# Patient Record
Sex: Female | Born: 1935
Health system: Southern US, Community
[De-identification: ages and names within clinical notes are randomized; demographics above are authoritative.]

## PROBLEM LIST (undated history)

## (undated) DIAGNOSIS — C4491 Basal cell carcinoma of skin, unspecified: Secondary | ICD-10-CM

## (undated) DIAGNOSIS — M545 Low back pain, unspecified: Secondary | ICD-10-CM

## (undated) DIAGNOSIS — I729 Aneurysm of unspecified site: Secondary | ICD-10-CM

## (undated) DIAGNOSIS — M353 Polymyalgia rheumatica: Secondary | ICD-10-CM

## (undated) DIAGNOSIS — D649 Anemia, unspecified: Secondary | ICD-10-CM

## (undated) DIAGNOSIS — K449 Diaphragmatic hernia without obstruction or gangrene: Secondary | ICD-10-CM

## (undated) DIAGNOSIS — K635 Polyp of colon: Secondary | ICD-10-CM

## (undated) DIAGNOSIS — Z950 Presence of cardiac pacemaker: Secondary | ICD-10-CM

## (undated) DIAGNOSIS — I501 Left ventricular failure: Secondary | ICD-10-CM

## (undated) DIAGNOSIS — E7800 Pure hypercholesterolemia, unspecified: Secondary | ICD-10-CM

## (undated) DIAGNOSIS — I10 Essential (primary) hypertension: Secondary | ICD-10-CM

## (undated) DIAGNOSIS — J309 Allergic rhinitis, unspecified: Secondary | ICD-10-CM

## (undated) DIAGNOSIS — E039 Hypothyroidism, unspecified: Secondary | ICD-10-CM

## (undated) DIAGNOSIS — G4700 Insomnia, unspecified: Secondary | ICD-10-CM

## (undated) DIAGNOSIS — G8929 Other chronic pain: Secondary | ICD-10-CM

## (undated) DIAGNOSIS — F419 Anxiety disorder, unspecified: Secondary | ICD-10-CM

## (undated) DIAGNOSIS — E785 Hyperlipidemia, unspecified: Secondary | ICD-10-CM

## (undated) DIAGNOSIS — K219 Gastro-esophageal reflux disease without esophagitis: Secondary | ICD-10-CM

## (undated) DIAGNOSIS — M810 Age-related osteoporosis without current pathological fracture: Secondary | ICD-10-CM

## (undated) HISTORY — DX: Left ventricular failure, unspecified: I50.1

## (undated) HISTORY — PX: ABDOMINAL HYSTERECTOMY: SHX81

## (undated) HISTORY — DX: Anemia, unspecified: D64.9

## (undated) HISTORY — PX: BACK SURGERY: SHX140

## (undated) HISTORY — DX: Diaphragmatic hernia without obstruction or gangrene: K44.9

## (undated) HISTORY — PX: TONSILLECTOMY: SUR1361

## (undated) HISTORY — DX: Gastro-esophageal reflux disease without esophagitis: K21.9

## (undated) HISTORY — PX: THYROIDECTOMY: SHX17

## (undated) HISTORY — DX: Basal cell carcinoma of skin, unspecified: C44.91

## (undated) HISTORY — DX: Allergic rhinitis, unspecified: J30.9

## (undated) HISTORY — PX: CATARACT EXTRACTION: SUR2

## (undated) HISTORY — DX: Presence of cardiac pacemaker: Z95.0

## (undated) HISTORY — DX: Hyperlipidemia, unspecified: E78.5

## (undated) HISTORY — DX: Polyp of colon: K63.5

## (undated) HISTORY — DX: Anxiety disorder, unspecified: F41.9

## (undated) HISTORY — DX: Insomnia, unspecified: G47.00

## (undated) HISTORY — DX: Other chronic pain: G89.29

## (undated) HISTORY — DX: Essential (primary) hypertension: I10

## (undated) HISTORY — PX: FOOT SURGERY: SHX648

## (undated) HISTORY — PX: MITRAL VALVE REPAIR: SHX2039

## (undated) HISTORY — DX: Low back pain: M54.5

## (undated) HISTORY — PX: OTHER SURGICAL HISTORY: SHX169

## (undated) HISTORY — DX: Hypothyroidism, unspecified: E03.9

## (undated) HISTORY — DX: Age-related osteoporosis without current pathological fracture: M81.0

## (undated) HISTORY — DX: Low back pain, unspecified: M54.50

## (undated) HISTORY — DX: Polymyalgia rheumatica: M35.3

## (undated) HISTORY — DX: Pure hypercholesterolemia, unspecified: E78.00

## (undated) HISTORY — PX: ANGIOPLASTY: SHX39

## (undated) HISTORY — DX: Aneurysm of unspecified site: I72.9

---

## 2000-03-25 ENCOUNTER — Encounter: Admission: RE | Admit: 2000-03-25 | Discharge: 2000-03-25 | Payer: Self-pay | Admitting: Family Medicine

## 2000-03-25 ENCOUNTER — Encounter: Payer: Self-pay | Admitting: Family Medicine

## 2002-04-16 ENCOUNTER — Encounter (INDEPENDENT_AMBULATORY_CARE_PROVIDER_SITE_OTHER): Payer: Self-pay | Admitting: Specialist

## 2002-04-16 ENCOUNTER — Ambulatory Visit (HOSPITAL_COMMUNITY): Admission: RE | Admit: 2002-04-16 | Discharge: 2002-04-16 | Payer: Self-pay | Admitting: Gastroenterology

## 2004-04-06 ENCOUNTER — Encounter: Admission: RE | Admit: 2004-04-06 | Discharge: 2004-04-06 | Payer: Self-pay | Admitting: Family Medicine

## 2006-01-26 ENCOUNTER — Inpatient Hospital Stay (HOSPITAL_COMMUNITY): Admission: EM | Admit: 2006-01-26 | Discharge: 2006-02-01 | Payer: Self-pay | Admitting: *Deleted

## 2006-01-28 ENCOUNTER — Encounter (INDEPENDENT_AMBULATORY_CARE_PROVIDER_SITE_OTHER): Payer: Self-pay | Admitting: *Deleted

## 2006-04-13 ENCOUNTER — Emergency Department (HOSPITAL_COMMUNITY): Admission: EM | Admit: 2006-04-13 | Discharge: 2006-04-14 | Payer: Self-pay | Admitting: *Deleted

## 2006-07-12 ENCOUNTER — Inpatient Hospital Stay (HOSPITAL_COMMUNITY): Admission: AD | Admit: 2006-07-12 | Discharge: 2006-07-15 | Payer: Self-pay | Admitting: Interventional Cardiology

## 2006-07-25 ENCOUNTER — Ambulatory Visit (HOSPITAL_COMMUNITY): Admission: RE | Admit: 2006-07-25 | Discharge: 2006-07-25 | Payer: Self-pay | Admitting: Interventional Cardiology

## 2006-07-25 ENCOUNTER — Encounter (INDEPENDENT_AMBULATORY_CARE_PROVIDER_SITE_OTHER): Payer: Self-pay | Admitting: Interventional Cardiology

## 2006-08-14 ENCOUNTER — Inpatient Hospital Stay (HOSPITAL_COMMUNITY): Admission: AD | Admit: 2006-08-14 | Discharge: 2006-08-24 | Payer: Self-pay | Admitting: Interventional Cardiology

## 2006-08-14 ENCOUNTER — Encounter: Payer: Self-pay | Admitting: Vascular Surgery

## 2006-08-14 ENCOUNTER — Inpatient Hospital Stay (HOSPITAL_BASED_OUTPATIENT_CLINIC_OR_DEPARTMENT_OTHER): Admission: RE | Admit: 2006-08-14 | Discharge: 2006-08-14 | Payer: Self-pay | Admitting: Interventional Cardiology

## 2006-09-19 ENCOUNTER — Encounter (HOSPITAL_COMMUNITY): Admission: RE | Admit: 2006-09-19 | Discharge: 2006-12-18 | Payer: Self-pay | Admitting: Interventional Cardiology

## 2006-10-14 ENCOUNTER — Encounter (INDEPENDENT_AMBULATORY_CARE_PROVIDER_SITE_OTHER): Payer: Self-pay | Admitting: Interventional Cardiology

## 2006-10-14 ENCOUNTER — Ambulatory Visit (HOSPITAL_COMMUNITY): Admission: RE | Admit: 2006-10-14 | Discharge: 2006-10-14 | Payer: Self-pay | Admitting: Interventional Cardiology

## 2006-10-16 ENCOUNTER — Inpatient Hospital Stay (HOSPITAL_BASED_OUTPATIENT_CLINIC_OR_DEPARTMENT_OTHER): Admission: RE | Admit: 2006-10-16 | Discharge: 2006-10-16 | Payer: Self-pay | Admitting: Interventional Cardiology

## 2006-10-16 HISTORY — PX: CARDIAC CATHETERIZATION: SHX172

## 2006-12-19 ENCOUNTER — Encounter (HOSPITAL_COMMUNITY): Admission: RE | Admit: 2006-12-19 | Discharge: 2007-02-07 | Payer: Self-pay | Admitting: Interventional Cardiology

## 2007-01-28 ENCOUNTER — Encounter: Admission: RE | Admit: 2007-01-28 | Discharge: 2007-01-28 | Payer: Self-pay | Admitting: Sports Medicine

## 2007-01-29 ENCOUNTER — Encounter: Admission: RE | Admit: 2007-01-29 | Discharge: 2007-01-29 | Payer: Self-pay | Admitting: Sports Medicine

## 2007-02-11 ENCOUNTER — Encounter: Admission: RE | Admit: 2007-02-11 | Discharge: 2007-02-11 | Payer: Self-pay | Admitting: Radiology

## 2007-02-17 ENCOUNTER — Ambulatory Visit (HOSPITAL_COMMUNITY): Admission: RE | Admit: 2007-02-17 | Discharge: 2007-02-17 | Payer: Self-pay | Admitting: Radiology

## 2007-03-04 ENCOUNTER — Encounter: Admission: RE | Admit: 2007-03-04 | Discharge: 2007-03-04 | Payer: Self-pay | Admitting: Radiology

## 2007-03-05 ENCOUNTER — Encounter: Admission: RE | Admit: 2007-03-05 | Discharge: 2007-03-05 | Payer: Self-pay | Admitting: Radiology

## 2007-03-11 ENCOUNTER — Encounter: Admission: RE | Admit: 2007-03-11 | Discharge: 2007-03-11 | Payer: Self-pay | Admitting: Radiology

## 2007-05-05 ENCOUNTER — Encounter: Admission: RE | Admit: 2007-05-05 | Discharge: 2007-05-05 | Payer: Self-pay | Admitting: Cardiology

## 2007-05-08 ENCOUNTER — Inpatient Hospital Stay (HOSPITAL_COMMUNITY): Admission: RE | Admit: 2007-05-08 | Discharge: 2007-05-09 | Payer: Self-pay | Admitting: Cardiology

## 2007-10-07 ENCOUNTER — Encounter: Admission: RE | Admit: 2007-10-07 | Discharge: 2007-11-26 | Payer: Self-pay | Admitting: Family Medicine

## 2007-12-01 ENCOUNTER — Encounter: Admission: RE | Admit: 2007-12-01 | Discharge: 2007-12-31 | Payer: Self-pay | Admitting: Family Medicine

## 2007-12-04 ENCOUNTER — Encounter: Admission: RE | Admit: 2007-12-04 | Discharge: 2007-12-04 | Payer: Self-pay | Admitting: Family Medicine

## 2008-09-21 ENCOUNTER — Emergency Department (HOSPITAL_COMMUNITY): Admission: EM | Admit: 2008-09-21 | Discharge: 2008-09-21 | Payer: Self-pay | Admitting: Emergency Medicine

## 2008-11-30 IMAGING — CR DG LUMBAR SPINE COMPLETE 4+V
5 series · 5 of 5 positions shown · non-contrast
Comparison: MRI 01/01/07.

CLINICAL DATA: 75-year-old female, L-1 fracture.  Increased pain since original fracture. 
LUMBAR SPINE ? 4 VIEW:

[w l-spine a.p.]
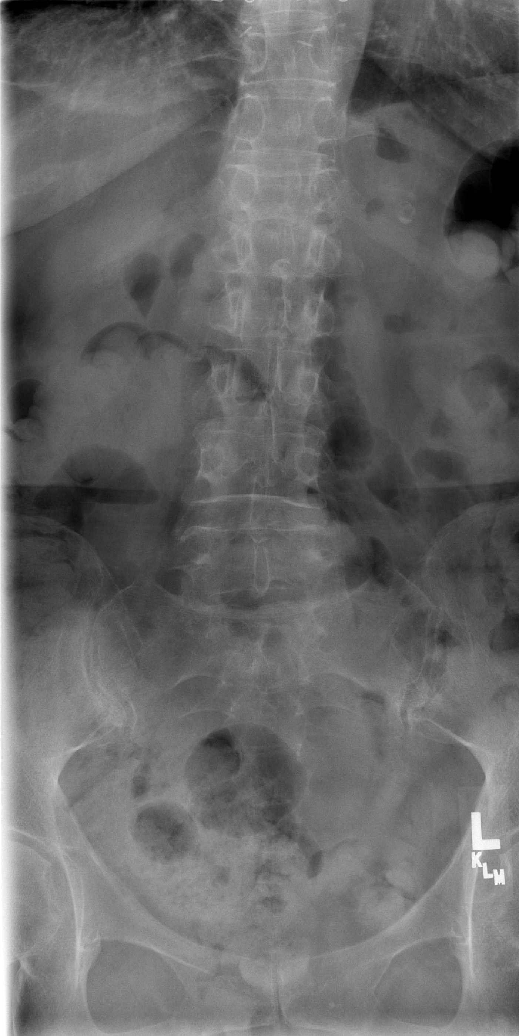

[w l-spine lat]
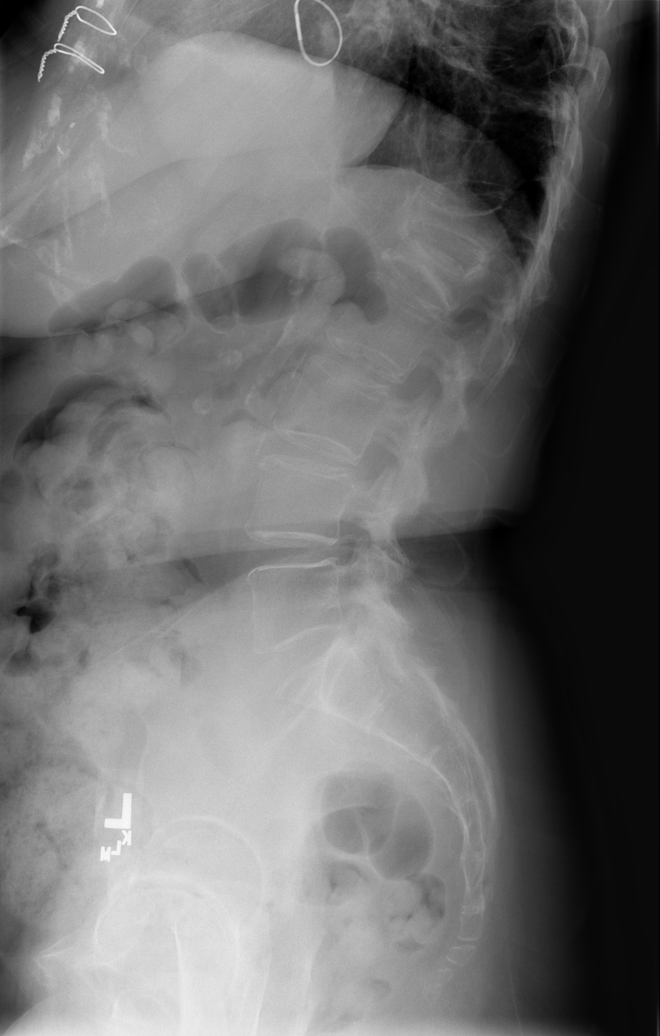

[t l-spine oblique exposure (1 of 2)]
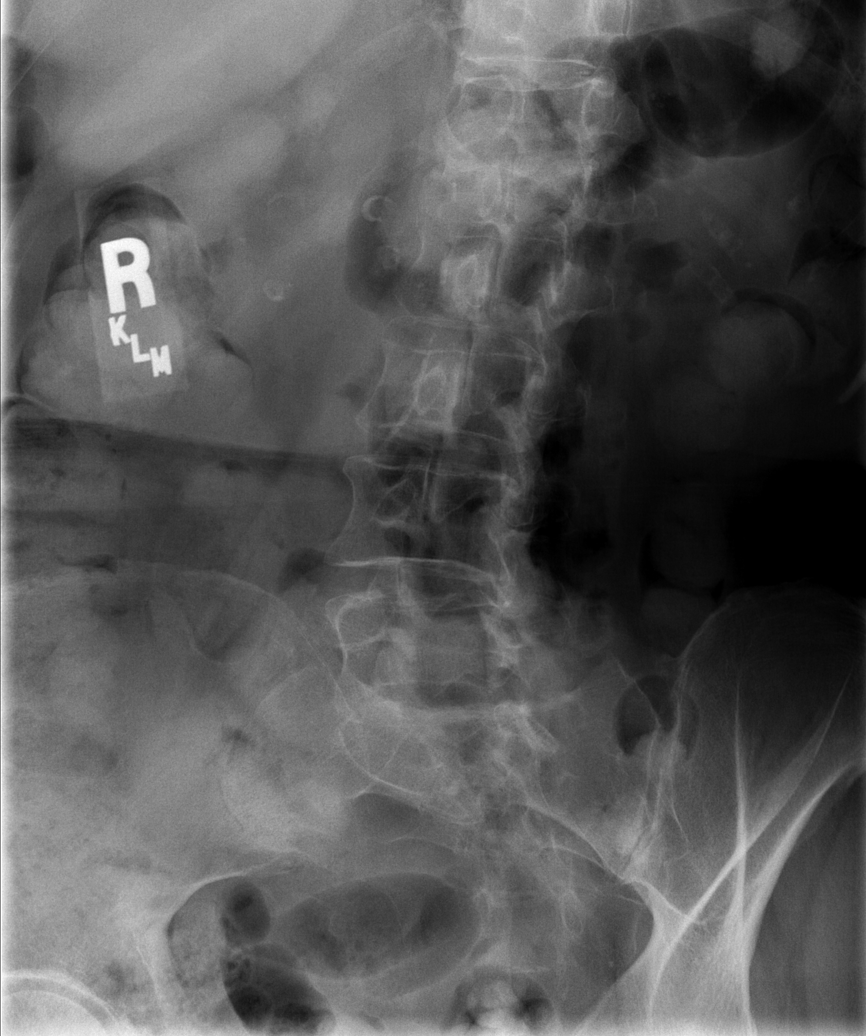

[t l-spine oblique exposure (2 of 2)]
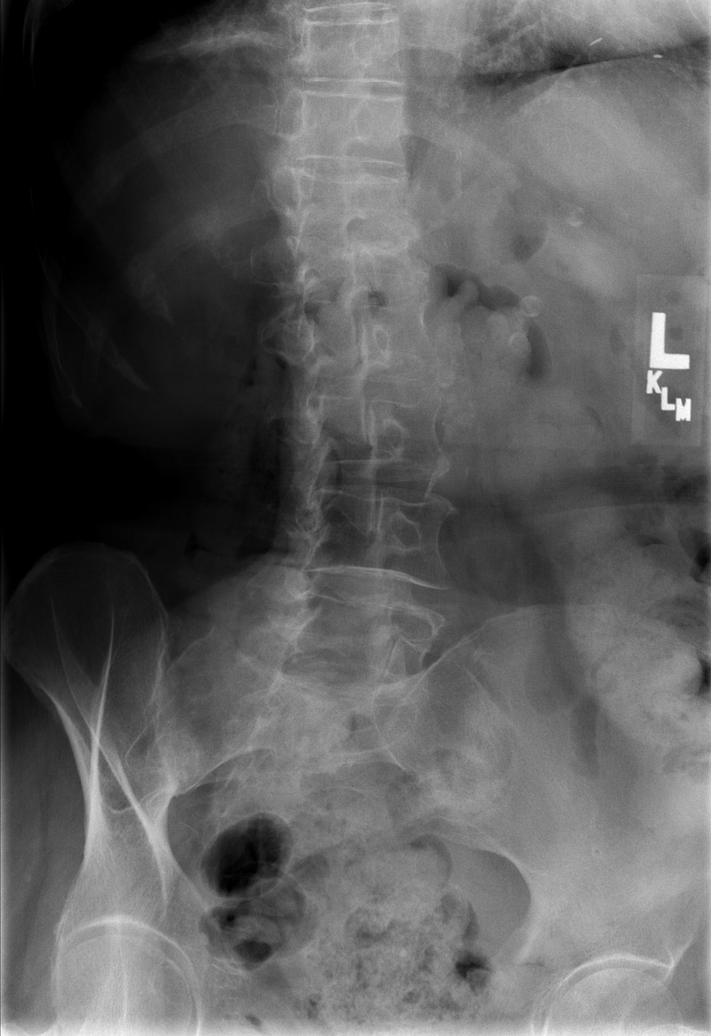

[t l-spine l5-s1 spot]
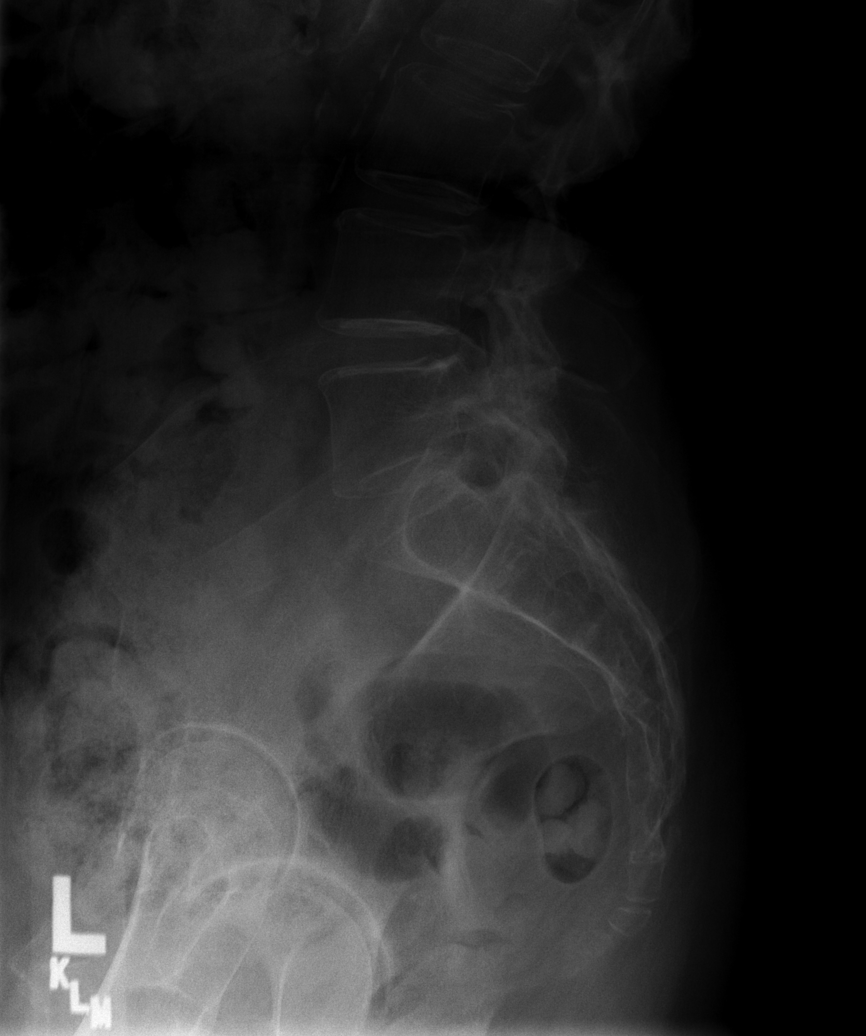

[5 of 5 positions shown; findings below may reference images not displayed]

FINDINGS: The compression fracture at L-1 is re-demonstrated.  There is increased sclerosis within this, compatible with an acute or subacute fracture.  A new fracture is evident at T-12.  This was not seen on the MRI.  There is no definite retropulsion of bone.  Facet degenerative changes are noted in the lower lumbar spine.  The L-2 through L-5 vertebral bodies are maintained.  Alignment is anatomic.  Atherosclerotic calcifications are noted within the aorta.
IMPRESSION: 1.  Compression fractures of T-12 and L-1.
2.  The L-1 fracture is of a similar configuration to the MRI.
3.  The T-12 fracture is new.

## 2009-03-07 ENCOUNTER — Encounter: Admission: RE | Admit: 2009-03-07 | Discharge: 2009-03-07 | Payer: Self-pay | Admitting: Gastroenterology

## 2010-10-18 ENCOUNTER — Emergency Department (HOSPITAL_COMMUNITY): Admission: EM | Admit: 2010-10-18 | Discharge: 2010-10-18 | Payer: Self-pay | Admitting: Emergency Medicine

## 2010-12-16 ENCOUNTER — Encounter: Payer: Self-pay | Admitting: Thoracic Surgery (Cardiothoracic Vascular Surgery)

## 2010-12-17 ENCOUNTER — Encounter: Payer: Self-pay | Admitting: Radiology

## 2011-02-06 LAB — BASIC METABOLIC PANEL WITH GFR
BUN: 24 mg/dL — ABNORMAL HIGH (ref 6–23)
CO2: 22 meq/L (ref 19–32)
Calcium: 9 mg/dL (ref 8.4–10.5)
Chloride: 104 meq/L (ref 96–112)
Creatinine, Ser: 1.5 mg/dL — ABNORMAL HIGH (ref 0.4–1.2)
GFR calc non Af Amer: 34 mL/min — ABNORMAL LOW
Glucose, Bld: 101 mg/dL — ABNORMAL HIGH (ref 70–99)
Potassium: 4.9 meq/L (ref 3.5–5.1)
Sodium: 136 meq/L (ref 135–145)

## 2011-02-06 LAB — TROPONIN I: Troponin I: 0.02 ng/mL (ref 0.00–0.06)

## 2011-02-06 LAB — IRON AND TIBC
Saturation Ratios: 12 % — ABNORMAL LOW (ref 20–55)
TIBC: 238 ug/dL — ABNORMAL LOW (ref 250–470)

## 2011-02-06 LAB — CK TOTAL AND CKMB (NOT AT ARMC)
CK, MB: 6.6 ng/mL (ref 0.3–4.0)
Total CK: 328 U/L — ABNORMAL HIGH (ref 7–177)

## 2011-02-06 LAB — URINE MICROSCOPIC-ADD ON

## 2011-02-06 LAB — CBC
MCH: 30.1 pg (ref 26.0–34.0)
MCHC: 31.4 g/dL (ref 30.0–36.0)
MCV: 95.9 fL (ref 78.0–100.0)
Platelets: 316 10*3/uL (ref 150–400)
RBC: 3.45 MIL/uL — ABNORMAL LOW (ref 3.87–5.11)

## 2011-02-06 LAB — URINALYSIS, ROUTINE W REFLEX MICROSCOPIC
Glucose, UA: NEGATIVE mg/dL
Hgb urine dipstick: NEGATIVE
Ketones, ur: NEGATIVE mg/dL
Protein, ur: NEGATIVE mg/dL
pH: 5 (ref 5.0–8.0)

## 2011-02-06 LAB — VITAMIN B12: Vitamin B-12: >2000 pg/mL — ABNORMAL HIGH (ref 211–911)

## 2011-02-06 LAB — DIFFERENTIAL
Basophils Relative: 1 % (ref 0–1)
Eosinophils Absolute: 0.4 10*3/uL (ref 0.0–0.7)
Eosinophils Relative: 4 % (ref 0–5)
Lymphs Abs: 3 10*3/uL (ref 0.7–4.0)
Monocytes Relative: 14 % — ABNORMAL HIGH (ref 3–12)

## 2011-02-06 LAB — HEPATIC FUNCTION PANEL
Albumin: 3.2 g/dL — ABNORMAL LOW (ref 3.5–5.2)
Indirect Bilirubin: 0.2 mg/dL — ABNORMAL LOW (ref 0.3–0.9)
Total Protein: 6.5 g/dL (ref 6.0–8.3)

## 2011-02-06 LAB — POCT CARDIAC MARKERS

## 2011-02-06 LAB — BRAIN NATRIURETIC PEPTIDE: Pro B Natriuretic peptide (BNP): 90 pg/mL (ref 0.0–100.0)

## 2011-02-09 ENCOUNTER — Encounter (INDEPENDENT_AMBULATORY_CARE_PROVIDER_SITE_OTHER): Payer: Self-pay | Admitting: *Deleted

## 2011-02-13 NOTE — Letter (Signed)
Summary: Appointment - Reminder Rogers, [place]  [address] [place]   [name], [place] [address]   Phone: [phone]  Fax: [phone]     February 09, 2011 MRN: [id number]   [facility] [address] [place], [place]  [address]   Dear Ms. Bergemann,  Our records indicate that it is time to schedule a follow-up appointment.  Dr.Taylor recommended that you follow up with [place] in April. It is very important that we reach you to schedule this appointment. We look forward to participating in your health care needs. Please contact us at the number listed above at your earliest convenience to schedule your appointment.  If you are unable to make an appointment at this time, give [place] a call so we can update our records.     Sincerely,   Public relations account executive

## 2011-04-10 NOTE — Op Note (Signed)
NAMEJORGIE, [name]                ACCOUNT NO.:  [id number]   MEDICAL RECORD NO.:  [id number]          PATIENT TYPE:  INP   LOCATION:  6524                         FACILITY:  [facility]   PHYSICIAN:  Barnett Abu, M.D.  DATE OF BIRTH:  02-13-1932   DATE OF PROCEDURE:  05/08/2007  DATE OF DISCHARGE:                               OPERATIVE REPORT   PROCEDURES PERFORMED:  1. Insertion implantable cardiac defibrillator and biventricular      pacemaker.  2. Defibrillation threshold testing.  3. Left subclavian venogram.  4. Coronary sinus venogram.   INDICATION:  [name] is a 77 year old woman who is now nine months  status post mitral valve repair with subsequent severe worsening of LV  function.  EF now at 15-20%.  She reports symptoms of heart failure in  an NYHA class III and occasionally class IV pattern.  She will become  dyspneic at rest with supine position.  She has a wide QRS complex at  greater than 130 milliseconds secondary to left bundle branch block.  This is a class IA  criteria for implantation of a prophylactic cardiac  defibrillator and heart failure therapy with cardiac resynchronization.   PROCEDURE NOTE:  The patient is brought to the cardiac catheterization  laboratory in the fasting state.  The left prepectoral region was  prepped and draped in the usual sterile fashion.  Local anesthesia was  obtained with infiltration of 1% lidocaine with epinephrine throughout  the left prepectoral region.  The left subclavian venogram was then  performed with a peripheral injection of 20 mL of Omnipaque.  A digital  cine angiogram was obtained and road mapped to guide future left  subclavian puncture.  The venogram did demonstrate the subclavian vein  to be widely patent and coursing in a normal fashion over the anterior  surface of the first rib and beneath the middle third of the clavicle.  There was no evidence for persistence of the left superior vena cava.   A 7-8  cm incision was then made in the deltopectoral groove and this was  carried down by sharp dissection and electrocautery to the prepectoral  fascia.  There a plane was lifted and a pocket formed inferiorly and  medially using blunt dissection electrocautery.  It was then packed with  1% kanamycin soaked gauze.  Three separate left subclavian punctures  were then made with a micropuncture needle.  Eventually placing three  0.038 inch guidewires, over the first guidewire a 9-French tearaway  sheath and dilator were advanced.  The dilator and wire were removed and  the ventricular lead was advanced to the level of the right atrium and  the sheath was torn away.  Using standard technique and fluoroscopic  landmarks, the lead was manipulated into the right ventricular apex.  There excellent pacing parameters were obtained as will be noted below.  The lead was tested for diaphragmatic pacing at 10 volts and none was  found.  The lead was then sutured into place using three separate 0 silk  ligatures.  Over the second guidewire a 7-French tearaway sheath and  dilator were advanced.  The dilator and wire were removed and the atrial  lead was advanced to the level of the right atrium and the sheath was  torn away.  Using standard technique and fluoroscopic landmarks, the  lead was manipulated into the right atrial appendage remnant where  excellent pacing parameters were obtained as will be noted below.  Both  the right ventricular lead and the right atrial lead were active  fixation devices and the screws were advanced as appropriate.  The  atrial lead was tested for diaphragmatic pacing at 10 volts and none was  found.  The lead was then sutured into place using three separate 0 silk  ligatures.  Over the remaining guidewire, a 9.5 French tearaway sheath  was advanced.  The dilator was removed, the wire was allowed to remain  in place and a Medtronic multipurpose coronary sinus guiding catheter   was advanced to the level of the right atrium.  The dilator was removed  using standard technique and fluoroscopic landmarks.  The coronary sinus  was eventually cannulated without extensive difficulty.  A coronary  sinus venogram was then performed in an LAO and RAO projection using  hand injection.  It demonstrated a widely patent coronary sinus without  evidence of any stricture and there was a large, somewhat tortuous  lateral vein which was quite suitable for placement of the left  ventricular lead.  This vein was cannulated using an 0.014 inch Prowater  intracoronary guidewire.  Over this guidewire, the Medtronic bipolar  left ventricular lead was advanced and with some additional  manipulations, adequately placed into the more distal portion of the of  the lateral vein.  There, excellent pacing parameters were also obtained  as will be noted below.  The lead was tested for diaphragmatic pacing at  10 volts and none was found.  The guiding catheter was then removed in  the standard fashion using a Administrator, [employer].  After documenting  adequate pacing parameters once again from the left ventricular lead and  ensuring adequate slack present in all leads, the left ventricular lead  was sutured into place using three separate 0 silk ligatures.   We then proceeded with defibrillation threshold testing.  After the  administration of 200 mcg of fentanyl, 8 mcg of midazolam and 2 mg of  hydromorphone, adequate moderate sedation was obtained and we proceeded  with defibrillation threshold testing.   Ventricular fibrillation was induced by the shock on T technique.  The  device promptly detected the arrhythmia, charged and delivered a 20  joule shock with prompt return of sinus rhythm.  There were no dropouts  noted.  The duration for charge detection and charge was 8 seconds.  The  impedance was 41 ohms.   The wound was then inspected for bleeding and none was found.  The wound was then  closed using 2-0 Vicryl in a running fashion for the  subcutaneous layers.  Two layers were applied.  The skin was  approximated using 4-0 Vicryl in a running subcuticular fashion.  Steri-  Strips and sterile dressing were applied.  The patient was transported  to the recovery area in stable condition.  She did require reversal of  her benzodiazepine with 0.4 mg of Romazicon.   EQUIPMENT DATA:  The cardiac defibrillator biventricular device is a  Medtronic Concerto model number 0000000, serial number [id number].  The  right ventricular lead is Medtronic model number W971058, serial number  [id number].  The atrial lead  is Medtronic model number C338645, serial  number [id number].  The left ventricular lead is Medtronic model number  [id number], serial number [id number].   PACING DATA:  The right ventricular lead detected a 21 mV R wave.  The  pacing threshold was 1 volt at 0.5 millisecond pulse width.  The  impedance was 849 ohms resulting in a current at capture threshold of  1.4 MA.  The atrial lead detected a 1.7 mV P-wave.  The pacing threshold  was 0.6 volts at 0.5 milliseconds pulse width.  The impedance was 539  ohms resulting in a current at capture threshold of 1.3 MA.  The left  ventricular lead detected an 18 mV R wave.  The impedance was 1113 ohms.  The threshold was 0.8 volts at 0.5 milliseconds pulse width.  The  current at capture threshold was 1.3 MA.      Barnett Abu, M.D.  Electronically Signed     JHE/MEDQ  D:  05/08/2007  T:  05/09/2007  Job:  [id number]   cc:   Jettie Booze, MD

## 2011-04-10 NOTE — Discharge Summary (Signed)
NAMEJAMARRIA, [name] NO.:  [id number]   MEDICAL RECORD NO.:  [id number]          PATIENT TYPE:  INP   LOCATION:  [id number]                         FACILITY:  [facility]   PHYSICIAN:  Barnett Abu, M.D.  DATE OF BIRTH:  07-26-1932   DATE OF ADMISSION:  05/08/2007  DATE OF DISCHARGE:  05/09/2007                               DISCHARGE SUMMARY   PRIMARY CARDIOLOGIST:  Jettie Booze, MD.   PRIMARY CARE PHYSICIAN:  Mardene Sayer   ADMISSION DIAGNOSIS:  1. Severe LV dysfunction.  There was an EF of 40-45%, left bundle      branch block, Class III-IV heart failure.   DISCHARGE DIAGNOSES:  1. Severe LV dysfunction with an EF of 40-45%, left bundle branch      block, class III to IV heart failure, compensated status post Bi-V      ICD implantation.  2. Severe mitral regurgitation status post mitral valve repair in      07/2006.  3. Mitral regurgitation that has been characterized as moderate to      severe with intact LV systolic function in March 2007.  4. Polymyalgia rheumatica.  5. Hiatal hernia.  6. Hypertension, controlled.  7. Hypercholesterolemia.  8. Hypothyroidism.  9. Chronic low back pain.  10.Severe osteoporosis.   CONSULTATIONS:  None.   PROCEDURES:  05/08/2007 insertion of Bi-V ICD, defibrillator threshold  testing, left subclavian venogram, coronary sinus venogram.   HOSPITAL COURSE:  Mrs. Buri is a 77 year old female with a known  history of severe LV dysfunction, chronic left bundle branch block via  EKG, and class III to IV heart failure which is currently compensated.  She was admitted on 05/08/2007 for Bi-V ICD implantation.  The procedure  was performed by Dr. Rodell Perna and was well tolerated by the patient  with a minimal estimated blood loss.  A Medtronic device was installed.  On the following day the device was interrogated and was within normal  limits without event or changes.  During this admission, Mrs. Hyder  remained  stable.  Her left upper chest wound site was well approximated  without exudate, erythema or other signs of infection.  She had mild  ecchymosis.  She complained of nausea during the admission and was given  Zofran 4 mg x1 and is being discharged to home with a prescription for  Phenergan.  She currently takes Hydrocodone/APAP 7.5/500 mg every 4-6  hours as needed for back pain.  That prescription will be continued for  pain at the ICD implantation wound site as needed.  PA and lateral chest  x-ray revealed status post implantation of Bi-V ICD device with no  evidence of pneumothorax.  She is being discharged to home in stable  condition.  She was seen and examined by Dr. Rodell Perna prior to  discharge who was in agreement with the discharge decision.   LABORATORY DATA:  None during this admission.   EKGs:  12-lead EKG 05/08/2007 revealed electronic ventricular pacemaker  with a ventricular rate of 82 beats per minute.  There were no acute  ischemic changes.  CONDITION ON DISCHARGE:  Stable.   DISCHARGE MEDICATIONS:  1. Lisinopril 5 mg daily  2. Levothyroxine 88 mcg daily.  3. Medrol 2 mg alternating with 4 mg daily.  4. Lorazepam 0.5 mg three times daily.  5. Lipitor 10 mg daily.  6. Prevacid 30 mg daily.  7. Lopressor 25 mg twice daily.  8. Aspirin 81 mg daily.  9. Lasix 20 mg twice daily.  10.Potassium chloride 10 mEq daily.  11.Hydrocodone/APAP 7.5/500 mg every 4-6 hours as needed for pain.  A      prescription was provided without refills.  12.Fiortal 750 mg injection daily.  13.Vitamin D daily.  14.Caltrate twice daily.  15.Phenergan 25 mg twice daily.  This represented a new prescription.      A prescription was given with refills.   DISCHARGE INSTRUCTIONS:  1. Continue a low-sodium, low-fat, and low-cholesterol diet.  2. May shower in 3 days.  3. No lifting greater than 10 pounds 2 weeks.  4. No driving for 2 weeks.  5. May raise left arm to left shoulder in 3  days; may raise left arm      to left ear in 5 days; may raise left arm to the top left side of      the head in 6 days; may raise left arm to right ear in 9 days.  6. Keep wound area dry.  Do not apply creams, oils, or ointments to      the wound area.  7. If the patient notices drainage or discharge from the wound,      swelling, or bruising at site, or develops fever greater than 101      degrees Fahrenheit after being discharged to home, she has been      instructed to call the office at [phone].  8. Supplemental discharge instructions for pacemaker/defibrillator      patients was provided upon discharge.   FOLLOW-UP APPOINTMENTS:  Follow-up appointment with Dr. Rodell Perna has  been scheduled for wound check on July 7 a 12:45 p.m..  The patient is  scheduled to call [phone] if she is unable to make the scheduled  appointment.      Raiford Simmonds, [place]      Barnett Abu, M.D.  Electronically Signed    RDM/MEDQ  D:  05/09/2007  T:  05/09/2007  Job:  [id number]   cc:   C. Melinda Crutch, M.D.

## 2011-04-13 NOTE — Op Note (Signed)
NAMEHAANI, KOFFMAN                ACCOUNT NO.:  [id number]   MEDICAL RECORD NO.:  [id number]          PATIENT TYPE:  INP   LOCATION:  2315                         FACILITY:  [facility]   PHYSICIAN:  Gilford Raid, M.D.     DATE OF BIRTH:  07/27/1932   DATE OF PROCEDURE:  08/15/2006  DATE OF DISCHARGE:                                 OPERATIVE REPORT   PREOPERATIVE DIAGNOSIS:  Severe mitral regurgitation with congestive heart  failure and moderate left ventricular dysfunction.   POSTOPERATIVE DIAGNOSIS:  Severe mitral regurgitation with congestive heart  failure and moderate left ventricular dysfunction.   OPERATIVE PROCEDURE:  Median sternotomy, extracorporeal circulation, mitral  valve annuloplasty using a 26 mm Edward Physio annuloplasty ring, ligation  of left atrial appendage.   ATTENDING SURGEON:  Gilford Raid, M.D.   ASSISTANT:  Lanelle Bal, M.D.   ANESTHESIA:  General endotracheal.   CLINICAL HISTORY:  This patient is a 77 year old woman with a history of  hypertension, hypercholesterolemia, and polymyalgia who presented with a  recent history of shortness of breath and mild chest pressure.  She has been  having exertional dyspnea.  She had a chest x-ray showed bilateral pulmonary  edema.  She had a known history of mitral regurgitation that was noted to be  moderate to severe by transthoracic echo on January 28, 2006.  At that time,  left ventricular size was at the upper limits of normal with left  ventricular systolic function at the lower limits of normal with an ejection  fraction estimated at 50-55%.  She recently underwent a transesophageal  echocardiogram which showed severe central mitral regurgitation.  The valve  leaflets appeared to move well and there was no prolapse noted.  There was  moderate left ventricular dysfunction with an ejection fraction that had  decreased to about 40%.  Given her symptoms and decrease in left ventricular  function with severe  mitral regurgitation, it was felt that mitral valve  repair was indicated.  She had cardiac catheterization performed which  showed no significant coronary disease.  There was about 40% ostial stenosis  of a first marginal branch that was insignificant.  Left ventricular  ejection fraction was about 35-40% with 3+ mitral regurgitation.  Left  ventricular end diastolic pressure was 12.  After review of the studies and  examination of the patient, it was felt that mitral valve repair and  ligation of the left atrial appendage was the best treatment.  I discussed  the operative procedure with the patient and her daughter including  alternatives, benefits, and risks including bleeding, blood transfusion,  infection, stroke, myocardial infarction, failure of the valve repair  requiring further surgery in the future, and death.  They understood and  agreed to proceed.   OPERATIVE PROCEDURE:  The patient was taken to the operating room and placed  on the table in supine position.  After induction of general endotracheal  anesthesia, a Foley catheter was placed in the bladder using sterile  technique.  Preoperative intravenous antibiotics were given.  Then, the  chest, abdomen and both lower extremities were prepped  and draped in the  usual sterile manner.  Transesophageal echocardiogram was performed by Dr.  Annye Asa.  This showed 2+ mitral regurgitation by color Doppler.  By  continuous Doppler, there was no reversal of pulmonary vein flow.  The  measurement of the PISA radius suggested mild mitral regurgitation.  The  degree of regurgitation certainly increased as blood pressure was increased.  The structure of the mitral valve looked quite good.  There appeared to be  central regurgitation as suspected that was due to annular dilatation.  Her  left ventricle was certainly unloaded asleep in the operating room and I  thought that given her history of severe mitral regurgitation by   preoperative TEE and her decrease in left ventricular function and symptoms  of congestive heart failure and pulmonary edema, that it would be best to  proceed with a valve repair.  Ejection fraction was estimated at about 35%.  The aortic valve appeared unremarkable.  Right ventricular function appeared  normal.   Then, the chest was opened through a median sternotomy incision.  The  pericardium was opened in the midline.  Examination of the heart showed good  right ventricular contractility.  The ascending aorta had no palpable  plaques in it.  Then, the patient was heparinized and when an adequate  activated clotting time was achieved, the distal ascending aorta was  cannulated using a 20-French aortic cannula for arterial in flow.  Venous  outflow was achieved using bicaval venous cannulation with a 24-French metal  tipped right angle cannula placed in through a pursestring suture in the  superior vena cava and a 36-French plastic right angle cannula placed  through a pursestring suture in the low right atrium.  An antegrade  cardioplegia and vent cannula was inserted in the aortic root.  A retrograde  cardioplegic cannula was inserted through the right atrium and the coronary  sinus.   The patient was placed on cardiopulmonary bypass.  Then, the aorta was  crossclamped and 500 mL of cold blood antegrade cardioplegia was  administered in the aortic root with quick arrest of the heart.  Systemic  hypothermia to 30 degrees centigrade and topical hypothermia with iced  saline was used.  A temperature probe was placed in the septum and  insulating pad in the pericardium.  Additional doses of retrograde  cardioplegia were given at about 20 minute intervals to maintain myocardial  temperature around 10 degrees centigrade.   Then, the left atrium was opened through a vertical incision in the interatrial groove.  The mitral retractor was placed.  Examination of the  mitral valve showed  essentially normal anterior and posterior mitral  leaflets.  There was no prolapse.  The leaflets were pliable.  The  subvalvular apparatus all appeared intact.  The valve was tested with iced  saline and there was some central regurgitation.  This regurgitation  increased as the ventricle was progressively filled with more iced saline.  I felt this would be suitable for mitral annuloplasty.  Then, a series of 2-  0 Ethibond sutures were placed around the mitral annulus.  The size of the  anterior leaflet and the anterior trigonal distance were measured and a 26-  mm Edwards Physio ring was chosen.  This had model number [id number], serial  number [id number].  The sutures were then placed through the ring and the ring  lowered into place.  The sutures were tied sequentially.  The ring seated  nicely.  The valve was again  tested with iced saline and was completely  competent.  Then, the patient was rewarmed to 37 degrees centigrade.  The  left atrial appendage was suture ligated with a double layer of 3-0 Prolene  suture.  The atriotomy was closed with two layers of continuous 3-0 Prolene  suture.  The left side of the heart was de-aired.  The head was placed in  the Trendelenburg position.  The crossclamp was removed with a time of 67  minutes.  There was spontaneous return of sinus rhythm.   Two temporary right ventricular and right atrial pacing wires were placed  and brought out through the skin.  When the patient had been rewarmed to 37  degrees centigrade, she was weaned from cardiopulmonary bypass on low dose  dopamine.  Total bypass time was 98 minutes.  Transesophageal echocardiogram  was performed and showed no mitral regurgitation.  Left ventricular function  appeared unchanged.  There was akinesis of the septum which was unchanged  from preoperatively with global hypokinesis.  Protamine was then given and  the venous and aortic cannulae were removed without difficulty.  Hemostasis  was  achieved.  The patient was given 10 units of platelets due to obvious  coagulopathy.  This resulted in good hemostasis.  Three chest tubes were  placed with a tube in the right pleural space, a tube in the pericardium,  and one in the anterior mediastinum.  The sternum was then closed with #6  stainless wires.  The fascia was closed with continuous #1 Vicryl suture.  The subcutaneous tissues were closed with continuous 2-0 Vicryl and then the  skin with 3-0 Vicryl subcuticular closure.  The sponge, needle, and  instrument counts were correct according to the scrub nurse.  A dry sterile  dressing was applied over the incisions and around the chest tubes which  were hooked to Pleur-Evac suction.  The patient remained hemodynamically  stable and was transferred to the SICU in guarded but stable condition.      Gilford Raid, M.D.  Electronically Signed    BB/MEDQ  D:  08/15/2006  T:  08/16/2006  Job:  [id number]   cc:   Jettie Booze, MD

## 2011-04-13 NOTE — H&P (Signed)
NAMELARISA, GARSTKA                ACCOUNT NO.:  [id number]   MEDICAL RECORD NO.:  [id number]          PATIENT TYPE:  OIB   LOCATION:  1962                         FACILITY:  [facility]   PHYSICIAN:  Jettie Booze, MDDATE OF BIRTH:  1932/02/04   DATE OF ADMISSION:  10/16/2006  DATE OF DISCHARGE:                              HISTORY & PHYSICAL   HISTORY OF PRESENT ILLNESS:  Ms. Bayard is a 77 year old female who  recently underwent mitral valve repair in September 2007.  Since  surgery, she has noticed some tightness in her chest when taking a deep  breath; however, the sensation had improved.  She also was noted to have  a small bilateral pleural effusion.  She has also been noted to have  some CHF that was improved with Lasix.  For this reason, the patient  underwent a 2-D echo and this showed that her valve was okay, but her EF  had decreased from approximately 45% down to 15%.   Prior to her valve repair, she underwent coronary angiography that  showed nonobstructive coronary artery disease of the OM-1 with an ostial  of 40-50% stenosis; otherwise, minor luminal irregularities.  The  patient is brought back to cath lab today to reevaluate the coronary  arteries given this decrease in ejection fraction.   CURRENT MEDICATIONS:  Include:  1. Synthroid 0.5 mg a day.  2. Prevacid 30 mg a day.  3. Lorazepam p.r.n.  4. Medrol 4 mg a day.  5. Multivitamin daily.  6. Lasix 40 mg a day.  7. Potassium 20 mEq a day.  8. Sublingual nitroglycerin p.r.n. chest pain.  9. Cardizem 120 mg a day.  10.Lopressor 25 mg b.i.d.  11.Lipitor 10 mg a day.  12.Coumadin, which has been held for the past 5 days.   ALLERGIES:  NO KNOWN DRUG ALLERGIES.   PAST MEDICAL HISTORY:  1. Polymyalgia rheumatica.  2. Hiatal hernia.  3. Hypertension.  4. Hypercholesterolemia.  5. Hypothyroidism.  6. History of basal cell carcinoma.   PAST SURGICAL HISTORY:  1. Tonsillectomy.  2. Hysterectomy.  3. Foot  surgery.  4. Thyroidectomy.  5. Mitral valve repair.   SOCIAL HISTORY:  No tobacco, alcohol, or illicit drug use.   FAMILY HISTORY:  Significant for stroke and history of early coronary  artery disease.   PHYSICAL EXAMINATION:  VITAL SIGNS:  Pulse 110.  O2 saturations 93% on  room air.  Blood pressure 97/66.  HEENT:  Grossly normal.  No carotid or subclavian bruits.  No JVD or  thyromegaly.  Sclerae are clear.  Conjunctivae normal.  Nares without  drainage.  CHEST:  Clear to auscultation bilaterally.  No wheezing or rhonchi.  HEART:  Regular rate and rhythm with no systolic or diastolic murmur.  ABDOMEN:  Good bowel sounds.  Nontender and nondistended.  No masses.  No bruits.  LOWER EXTREMITIES:  No lower extremity pulses, although the legs are  warm and well perfused.  SKIN:  Warm and dry.  NEUROLOGIC:  Cranial nerves II-X grossly intact.  She is alert and  oriented x3.   ASSESSMENT/PLAN:  1. Cardiomyopathy, plan cardiac catheterization to evaluate coronary      arteries.  2. Severe mitral regurgitation status post mitral valve replacement      September 2007.  3. Polymyalgia rheumatica, on long-term prednisone therapy.  4. Hyperlipidemia.  5. Hypothyroidism.  6. Hypertension.  7. History of basal cell carcinoma.  8. Multiple medication use.   The patient has had a TEE to evaluate her mitral valve and this appears  normal.  She is undergoing a cardiac catheterization today to evaluate  her coronary arteries given the fact that her ejection fraction has  decreased from 45% to 15% since surgery.  The patient has been seen and  examined by Dr. Casandra Doffing.      Joesphine Bare, P.A.      Jettie Booze, MD  Electronically Signed    LB/MEDQ  D:  10/16/2006  T:  10/16/2006  Job:  [id number]   cc:   Gilford Raid, M.D.  Anson Oregon, M.D.

## 2011-04-13 NOTE — Cardiovascular Report (Signed)
[name], [name] NO.:  [id number]   MEDICAL RECORD NO.:  [id number]          PATIENT TYPE:  INP   LOCATION:  2399                         FACILITY:  [facility]   PHYSICIAN:  Jettie Booze, MDDATE OF BIRTH:  1932-05-19   DATE OF PROCEDURE:  DATE OF DISCHARGE:                              CARDIAC CATHETERIZATION   INDICATIONS:  Catheterization.  The patient will be having mitral valve  surgery.  This is a preoperative angiography   PROCEDURES PERFORMED:  Left heart catheterization, left ventriculogram  coronary angiogram, abdominal aortogram.   PROCEDURE NARRATIVE:  The risks, benefits of cardiac catheterization were  explained to the patient and informed consent was obtained.  The patient was  brought to the cath lab and placed on the table.  She was prepped and draped  in the usual sterile fashion.  Right groin was infiltrated with 1%  lidocaine.  A 4-french arterial sheath was placed into right femoral artery  using modified Seldinger technique.  A JL-4.0 catheter was advanced to the  ascending aorta under fluoroscopic guidance.  This did not successfully  engaged the ostium of the left main a JL-3.5 catheter was then advanced to  the ascending aorta to ostium of the left main under fluoroscopic guidance.  Digital angiography was performed in multiple projections using hand  injection with contrast.  Right coronary artery angiography was then  performed using a 3DRC catheter.  Digital angiography was performed in  multiple projections using hand injection with contrast.  The ventriculogram  was then performed using a pigtail Catheter.  The catheter was advanced to  the ascending aorta and across the aortic valve under fluoroscopic guidance.  A power injection of contrast was done in the 30 degrees RAO position.  The  catheter was then pulled back under continuous hemodynamic pressure  monitoring.  The catheter was then pulled back to the level of the  renal  arteries and abdominal aortogram was performed in the AP projection.  The  sheath was pulled using manual compression.   FINDINGS:  The left main is a long vessel which has only mild  irregularities.  The circumflex is a large vessel with mild irregularities.  There is a large OM1 with an ostial 40-50% stenosis.  The OM-2 is a medium-  sized vessel with luminal irregularities.  The left anterior descending is a  large vessel with mild luminal irregularities.  There is a large first  diagonal which is widely patent.  The right coronary artery is a large  dominant vessel which is angiographically normal.  The left ventriculogram  reveals moderately decreased left ventricular function with an estimated  ejection fraction of 35-40%.  There is 3+ mitral regurgitation noted.   HEMODYNAMICS:  Left ventricular pressure is 132/11 with an LVEDP of 12 mmHg.  Aortic pressure of 133/56 with a mean aortic pressure of 88.  the abdominal  aortogram shows no abdominal aortic aneurysm.  There are single renal artery  supplying his kidney.  There is no significant renal artery stenosis  bilaterally.   IMPRESSION:  1. Mild nonobstructive coronary artery disease  as described above.  2. Moderately decreased left ventricular function with ejection fraction      of 35-40% with significant mitral regurgitation.  3. No renal artery stenosis.   RECOMMENDATIONS:  1. The patient will be admitted for mitral valve repair per Dr. Cyndia Bent.  2. Will continue aspirin along with other therapy to prevent coronary      artery disease.  3. I will follow the patient while she is in the hospital.      Jettie Booze, MD  Electronically Signed     JSV/MEDQ  D:  08/14/2006  T:  08/15/2006  Job:  [id number]   cc:   Gilford Raid, M.D.  Gus Height, M.D.

## 2011-04-13 NOTE — Consult Note (Signed)
NAMEGURNAAZ, SCHWAKE                ACCOUNT NO.:  [id number]   MEDICAL RECORD NO.:  [id number]          PATIENT TYPE:  INP   LOCATION:  [id number]                         FACILITY:  [facility]   PHYSICIAN:  Belva Crome, M.D.   DATE OF BIRTH:  02/14/1932   DATE OF CONSULTATION:  01/28/2006  DATE OF DISCHARGE:                                   CONSULTATION   CONCLUSIONS:  1.  The patient is admitted to the hospital with nausea, vomiting and      diarrhea, felt to be probable viral.  2.  Chronic left bundle branch block.  3.  Fatigue, uncertain etiology. Rule out chronotropic dysfunction.  4.  History of recurrent syncope. Rule out neuromediator mechanism, rule out      other.  5.  Irregular heart rhythm noted on monitor, secondary to blocked PACs (the      most common cause of an unexplained pause is a blocked PAC).   RECOMMENDATIONS:  1.  The patient needs an exercise treadmill test to evaluate chronotropic      function. This will help to rule out sinus node dysfunction as a cause      for the patient's exertional fatigue.  2.  An ischemic evaluation has not yet been done and certainly a stress test      in the face of her left bundle branch block would not help to exclude      this. I am not certain based on the patient's current presentation that      an ischemic evaluation is really necessary.  3.  Nausea, vomiting and diarrhea may be causing some difficulty with      fatigue at this point and that seems to be improving.  4.  Should the patient have normal chronotropic function, should consider      outpatient tilt-table testing to rule out a neuromediator mechanism of      fatigue and recurrent syncope.   COMMENTS:  Ms. Gennusa is 77 years of age and was admitted to the hospital  with nausea, vomiting and diarrhea. She had previously undergone an  evaluation by Dr. Larae Grooms late 2006. She was found to have normal  LV function. She does have a chronic left bundle branch block.  An ischemic  evaluation was not done. LV function was evaluated by an echocardiogram.   Since being admitted to the hospital with nausea, vomiting and diarrhea,  concern arose about the patient's rhythm. There was some irregularity and  there were also some tachycardia. She has not had chest discomfort. She  denies dyspnea. She does complain of exertional fatigue.   MEDICATIONS:  1.  Medrol 4 mg per day.  2.  Synthroid 0.5 mg per day.  3.  Lipitor 10 mg at bedtime.  4.  Lotrel 10/20 mg per day.  5.  Prevacid I do not have the dose.   ALLERGIES:  NONE KNOWN.   SIGNIFICANT PAST MEDICAL PROBLEMS:  1.  Hiatal hernia.  2.  Basal cell on the leg and face.  3.  Hypothyroidism.  4.  Polymyalgia rheumatica.  5.  Hypertension.  6.  Hypercholesterolemia.   FAMILY HISTORY:  Noncontributory with reference to patient's current  presentation. Her mother did have a history of stroke prior to the age of  25.   PREVIOUS SURGERIES:  1.  Tonsillectomy.  2.  Thyroidectomy.  3.  Hysterectomy.  4.  Left ankle surgery.   HABITS:  She does not smoke, drink or use illicit drugs.   PHYSICAL EXAMINATION:  GENERAL:  The patient is ambulatory, she does appear  to have an outbreak of what appears to be herpes on her face. She is in no  acute distress.  VITAL SIGNS:  Her blood pressure is 155/71, heart rate is 87.  NECK EXAM:  Reveals no JVD.  LUNGS:  Clear.  CARDIAC EXAM:  No gallop, no rub, no click.  ABDOMEN:  Soft, bowel sounds normal.  EXTREMITIES:  No edema.   Chest x-ray reveals cardiomegaly, peribronchial thickening, bibasilar  atelectasis and some abnormality in the left upper lung zone.   LABORATORY DATA:  Reveals troponins that were negative x3. BUN and  creatinine are 15 and 0.9. TSH 0.86. Hemoglobin 11.8. EKGs reveal a left  bundle branch block, no change from prior. All of the rhythm strips on the  patient's chart and her telemetry trends have been evaluated and she has had  no  significant arrhythmias whatsoever. She has had occasional irregularity  in rhythm most commonly due to blocked PACs. She has had some sinus  tachycardia. She has had noted alarms on telemetry of heart rates greater  than 150, but when these areas of concern are individually inspected there  is double counting of both the QRS complex and the T-wave by the telemetry  equipment. No significant tachycardia has been documented.   DISCUSSION:  I am not certain that there is any relevant cardiac abnormality  involved here. There is a chance that she could be having chronotropic  incompetence due to sick sinus syndrome and a treadmill test would help to  rule that out as a possible cause for her exertional fatigue, i.e., if her  heart rate does not increase with activity, it will produce fatigue.  Apparently an echocardiogram has already been repeated, but she was  documented to have normal  LV function prior to this hospitalization when evaluated by Dr. Irish Lack in  late 2006. I doubt there has been any change but certainly if we can find an  acute change in LV function, then this would spur additional cardiac  evaluation including exclusion of ischemic heart disease.      Belva Crome, M.D.  Electronically Signed     HWS/MEDQ  D:  01/29/2006  T:  01/29/2006  Job:  [id number]   cc:   Melissa L. [name], MD   Darletta Moll Bubba Hales, M.D.  Fax: [phone]   Jettie Booze, MD  Fax: [phone]

## 2011-04-13 NOTE — Cardiovascular Report (Signed)
NAMEVY, HARTKE NO.:  [id number]   MEDICAL RECORD NO.:  [id number]          PATIENT TYPE:  OIB   LOCATION:  1962                         FACILITY:  [facility]   PHYSICIAN:  Jettie Booze, MDDATE OF BIRTH:  09-12-1932   DATE OF PROCEDURE:  10/16/2006  DATE OF DISCHARGE:                            CARDIAC CATHETERIZATION   REFERRING PHYSICIAN:  Gus Height, M.D.   PROCEDURE PERFORMED:  Left heart catheterization, left ventriculogram,  coronary angiogram, abdominal aortogram.   OPERATOR:  Jettie Booze, M.D.   INDICATIONS:  Congestive heart failure, status post mitral valve repair.   PROCEDURE:  The risks and benefits of cardiac catheterization were  explained to the patient and informed consent was obtained.  The patient  was brought to the cath lab and placed on the table.  She was prepped  and draped in the usual sterile fashion.  Her right groin was  infiltrated with 1% lidocaine.  A 4-French arterial sheath was placed  into her right femoral artery using the modified Seldinger technique.  Left coronary artery angiography was performed using a JL-4.0 catheter.  The catheter was advanced to the vessel ostium under fluoroscopic  guidance.  Digital angiography was performed in multiple projections  using hand injection of contrast.  The right coronary artery angiography  was then performed using a JR-4.0 catheter.  The catheter was advanced  to the vessel ostium under fluoroscopic guidance.  Digital angiography  was performed in multiple projections using hand injection of contrast.  The ventriculogram was then performed using a pigtail catheter.  The  catheter was advanced across the aortic valve under fluoroscopic  guidance.  A power injection of contrast was performed in the 30-degree  RAO position.  The catheter was then pulled back under continuous  hemodynamic pressure monitoring.  The catheter was withdrawn to the  level of the abdominal  aorta and power injection of contrast was  performed in the AP projection to visualize the renal artery.   FINDINGS:  The left main is a long vessel with mild irregularities.  The  circumflex is a large vessel with minor irregularities.  The OM1 is a  large vessel with an ostial 60% lesion, otherwise appears normal.  The  OM2 is a medium-sized vessel.  The LAD is a large vessel which appears  angiographically normal.  The first diagonal is a large vessel which  appears angiographically normal.  The second diagonal was a small  vessel.  The right coronary artery is a large dominant vessel.  There  appeared to be ostial catheter-related spasm.  The left ventriculogram  revealed global left ventricular diffuse hypokinesis.  The estimated  ejection fraction is 15-20%.  The abdominal aortogram revealed dual  arterial supply to the left kidney, a single arterial supply to the  right kidney.  There is no significant renal artery stenosis.   HEMODYNAMICS:  Left ventricular pressure 90/2 with an LVEDP of 7 mmHg.  Aortic pressure of 90/50.  Mean aortic pressure of 66 mmHg.   IMPRESSION:  1. No significant coronary artery disease to explain decreased left  ventricular function.  Moderate atherosclerosis in an ostial OM1 as      noted above.  2. Normal left ventricular end-diastolic pressure, severely reduced      left ventricular systolic contraction with an estimated ejection      fraction of 15-20%.  3. No renal artery stenosis.   RECOMMENDATIONS:  The patient will continue with aggressive medical  therapy including aspirin, lisinopril, beta blockade and lipid-lowering  therapy.  Of note, her thyroid function was found to be severely  reduced.  She should continue on her Synthroid.  I believe her current  cardiomyopathy is either from a viral process or perhaps hypothyroidism.  Will reassess LVEF in about 2 months.  If there is no significant  improvement, will consider BiV ICD  placement.      Jettie Booze, MD  Electronically Signed     JSV/MEDQ  D:  10/16/2006  T:  10/16/2006  Job:  [id number]   cc:   Gilford Raid, M.D.

## 2011-04-13 NOTE — H&P (Signed)
[name], [name]                ACCOUNT NO.:  [id number]   MEDICAL RECORD NO.:  [id number]          PATIENT TYPE:  OIB   LOCATION:  1962                         FACILITY:  [facility]   PHYSICIAN:  Jettie Booze, MDDATE OF BIRTH:  October 04, 1932   DATE OF ADMISSION:  08/14/2006  DATE OF DISCHARGE:                                HISTORY & PHYSICAL   REASON FOR ADMISSION:  Severe MR, coronary angiography.   [name] is a 77 year old female who is admitted with shortness of breath.  She ultimately had a 2-D echocardiogram that showed severe MR. She is  scheduled to have mitral valve replacement by Dr. Cyndia Bent tomorrow. However,  she has been admitted today for coronary angiography to assure she does not  need bypass graft. A TEE has been performed in the last month here at [facility], and I have asked for medical records to pull this note.   Lab studies on admission today show leukocytosis, 15,500, which has been  attributed to her steroid use secondary to her polymyalgia rheumatica.   CURRENT MEDICATIONS:  1. Synthroid 50 mcg 1 tablet daily.  2. Caduet 5/10 mg daily.  3. Medrol 4 mg a day.  4. Lorazepam 0.5 mg t.i.d.  5. Prevacid 30 mg a day.  6. Lasix 20 mg 2 tablets daily.  7. Potassium 10 mEq daily.  8. Lisinopril 10 mg 1 p.o. daily.  9. Sublingual nitroglycerin p.r.n.  10.Caltrate D 2 tablets a day.  11.Once a day vitamins daily.   ALLERGIES:  No known drug allergies.   PAST MEDICAL HISTORY:  1. Polymyalgia rheumatica.  2. Hiatal hernia.  3. Hypertension.  4. Hypercholesterolemia.  5. Hypothyroidism.  6. Basal cell carcinoma.  7. Severe MR.   PAST SURGICAL HISTORY:  1. Tonsillectomy.  2. Hysterectomy.  3. Foot surgery.  4. Thyroidectomy.   SOCIAL HISTORY:  No tobacco, alcohol or illicit drug use.   FAMILY HISTORY:  Significant for stroke. No early coronary artery disease.   REVIEW OF SYSTEMS:  Significant for shortness of breath. No orthopnea. No  palpitations. She does occasionally have some chest pressure. No focal  weakness or rash. All other symptoms are negative.   PHYSICAL EXAMINATION:  VITAL SIGNS:  Blood pressure 138/65, pulse 83, O2  saturation 98% on room air.  HEENT:  Grossly normal. No carotid or subclavian bruits. No JVD or  thyromegaly. Sclerae is clear. Conjunctivae are normal. Nares without  drainage.  HEART:  Regular rate and rhythm with a soft 1/6 systolic murmur. No  diastolic component.  LUNGS:  Clear to auscultation bilaterally.  ABDOMEN:  Good bowel sounds, nontender, nondistended. No masses or bruits.  EXTREMITIES:  Lower extremities:  No femoral pulses bilaterally.  Palpable  PT pulses bilaterally. No lower extremity edema.  SKIN:  Warm and dry.  NEUROLOGICAL:  Cranial nerves grossly intact. She is alert and oriented x3.   ASSESSMENT AND PLAN:  1. Severe mitral regurgitation.  2. Polymyalgia rheumatica.  3. Hyperlipidemia.  4. Hypothyroidism status post thyroidectomy.  5. Hypertension.  6. History of basal cell carcinoma.  7. No  allergies.  8. Multiple medication uses.   The patient will undergo mitral valve replacement tomorrow by Dr. Gilford Raid. She is being admitted today for cardiac catheterization to assure no  coronary anatomy will require bypass grafting during the surgery tomorrow.      Joesphine Bare, P.A.      Jettie Booze, MD  Electronically Signed    LB/MEDQ  D:  08/14/2006  T:  08/14/2006  Job:  [id number]   cc:   Gilford Raid, M.D.

## 2011-04-13 NOTE — Procedures (Signed)
[facility]  Patient:    [name], [name] Visit Number: [id number] MRN: [id number]          Service Type: END Location: ENDO Attending Physician:  Orvis Brill Dictated by:   Jeryl Columbia, M.D. Proc. Date: 04/16/02 Admit Date:  04/16/2002 Discharge Date: 04/16/2002   CC:         Darletta Moll. Bubba Hales, M.D.   Procedure Report  PROCEDURE:  Colonoscopy with polypectomy.  INDICATIONS FOR PROCEDURE:  Family history of colon cancer, due for repeat screening.  Consent was signed after risks, benefits, methods, and options were thoroughly discussed in the office.  MEDICINES USED:  Demerol 100, Versed 10.  DESCRIPTION OF PROCEDURE:  Rectal inspection was pertinent for external hemorrhoids. Digital exam was negative. The video pediatric adjustable colonoscope was inserted and despite a tortuous sigmoid was able to be advanced around the colon to the cecum. This did require some abdominal pressure but no position changes. The cecum was identified by the appendiceal orifice and the ileocecal valve. In fact, the scope was inserted a short ways into the terminal ileum which was normal. Photo documentation was obtained. The scope was slowly withdrawn. The cecum was normal. The majority of the ascending was normal. In the proximal of the hepatic flexure a tiny to small polyp was seen and was hot biopsied x1 and put in the first container. The scope was slowly withdrawn and in the mid transverse possibly another small sessile polyp was seen and was hot biopsied and put in the second container. The scope was further withdrawn. No additional polyps were seen as we slowly withdrew back to the rectum. Once back in the rectum, the scope was retroflexed pertinent for some internal hemorrhoids. The scope was straightened, air was suctioned, scope removed. The patient tolerated the procedure well. There was no obvious or immediate complication.  ENDOSCOPIC  DIAGNOSIS:  1. Internal and external hemorrhoids.  2. Tortuous particularly in the sigmoid.  3. Two tiny questionable polyps in the hepatic flexure and mid transverse     hot biopsied.  4. Otherwise within normal limits to the terminal ileum.  PLAN:  Await pathology to determine future screening. Happy to see back p.r.n. otherwise return care to Dr. Bubba Hales for the customary health care maintenance to include yearly rectals and guaiacs. Dictated by:   Jeryl Columbia, M.D. Attending Physician:  Orvis Brill DD:  04/16/02 TD:  04/19/02 Job: [id number]

## 2011-04-13 NOTE — Discharge Summary (Signed)
[name], [name]                ACCOUNT NO.:  [id number]   MEDICAL RECORD NO.:  [id number]          PATIENT TYPE:  INP   LOCATION:  2022                         FACILITY:  [facility]   PHYSICIAN:  Leta Baptist, PA   DATE OF BIRTH:  [birth year]   DATE OF ADMISSION:  08/14/2006  DATE OF DISCHARGE:                                 DISCHARGE SUMMARY   DICTATED FOR:  Gilford Raid, M.D.   ADMITTING DIAGNOSIS:  Severe mitral regurgitation.   DISCHARGE DIAGNOSES/PAST MEDICAL HISTORY:  1. Polymyalgia rheumatica.  2. Hiatal hernia.  3. Hypertension.  4. Hypercholesteremia.  5. Hyperthyroidism.  6. Basal cell carcinoma.  7. Tonsillectomy.  8. Hysterectomy.  9. Foot surgery.  10.Thyroidectomy.  11.Severe mitral regurgitation status post mitral valve annuloplasty and      ligation of left atrial appendage.  12.Postoperative acute blood loss anemia status post transfusion and      stable.  13.Postoperative atrial fibrillation, currently normal sinus rhythm.  14.Postoperative right effusion status post thoracentesis and currently      stable.   ALLERGIES:  No known drug allergies.   BRIEF HISTORY:  The patient is a 77 year old female who was evaluated by Dr.  Cyndia Bent as an outpatient for a recent history of shortness of breath and mild  chest pressure.  She was also experiencing exertional dyspnea.  A chest x-  ray revealed bilateral pulmonary edema and she had a known history of mitral  regurgitation that was noted to be moderate to severe by transthoracic echo  on 01/28/2006.  The patient then underwent a transesophageal echo which also  revealed severe central mitral regurgitation.  The patient was referred to  Dr. Cyndia Bent who again evaluated her as an outpatient.  Given her symptoms, it  was his opinion that the patient should undergo mitral valve repair,  however, he recommended proceeding with cardiac catheterization prior to  surgery in order to evaluate for a need of possible  concurrent coronary  artery bypass grafting.   HOSPITAL COURSE:  The patient was admitted and taken for cardiac  catheterization on 08/14/2006.  This revealed no significant coronary  disease, an ejection fraction of 35-40% and 3+ mitral regurgitation.  The  patient was maintained on routine hospital care secondary to these findings.  Was set for mitral valve repair the following day.  The patient was taken to  the OR on 08/15/2006, for mitral valve annuloplasty with a 26 mm Edwards  video ring.  She also underwent ligation of left atrial appendage.  Postoperative TEE revealed no mitral regurgitation.  Patient tolerated the  procedure well.  She was hemodynamically stable immediately postoperatively.  She was transferred from the OR to the recovery room in stable condition.  The patient was extubated without complication and woke up from anesthesia  neurologically intact.   On postoperative day 1, the patient was afebrile with stable vital signs and  had converted to atrial fibrillation.  She was started on IV amiodarone and  converted to a normal sinus rhythm.  The patient had some volume overload  postoperatively and has been diuresed accordingly.  All basic chest tubes  and lines were discontinued in a routine manner and she tolerated this well.  The patient then began to experience severe nausea over the next several  days.  Despite the fact that she had converted to a normal sinus rhythm her  amiodarone was thought to be the culprit and therefore it was discontinued.  She maintained a normal sinus rhythm for a short period of time and then  converted back into atrial fibrillation.  She was then started on p.o.  Cardizem and her beta blocker was restarted.  The patient then again  converted back to a normal sinus rhythm and has maintained a normal sinus  rhythm since that time throughout the remainder of the postoperative course.   Secondary to her acute blood loss anemia which was  noted on postoperative  day 4 with a hemoglobin of 8.1, she was transfused 1 unit of packed RBCs.  This has remained stable since that time as well.  The patient also began  cardiac rehab on postoperative day 4 and has continued to increase her  tolerance to a satisfactory level at this time.  On postoperative day 5, the  patient's chest x-ray revealed bilateral pleural effusion with increased  oxygen requirement.  Secondary to these findings a right thoracentesis was  performed on postoperative day 5 removing 350 mL of serous sanguineous  fluid.  The patient tolerated this well and her chest x-ray was stable  status post thoracentesis.  She has had no further difficulties from this.   The remainder of the patient's postoperative course has progressed as  expected.  Her anemia is stable, her chest x-ray is stable and she is  maintaining a normal sinus rhythm on beta blocker and Cardizem.  On  postoperative day 8, the patient is afebrile with stable vital signs and  maintaining normal sinus rhythm.  Her chest x-ray revealed decreased edema  with increased aeration at the bases.  Her only complaint is of a  nonproductive cough.   PHYSICAL EXAMINATION:  On physical examination, lungs revealed mildly  diminished breath sounds at the bases. Cardiac is regular rate and rhythm  without murmur, rub or gallop.  The abdomen is benign.  There is edema  present bilateral lower extremities, right greater than left.  The incision  is healing well.  The patient is in stable condition this time.  As long as  she continues to progress in the current manner, she will be ready for  discharged within the next 1-2 days pending morning reevaluation.   LABORATORY DATA:  CBC and BMP on 08/21/2006, white count 11, hemoglobin 10,  hematocrit 29, platelets 231,000, sodium 132, potassium 4.3, BUN 22,  creatinine 1, glucose 110.  INR on 08/23/2006, 2.2.   CONDITION ON DISCHARGE:  Improved.  DISCHARGE  MEDICATIONS:  1. Lopressor 25 mg two times daily.  2. Cardizem 30 mg four times a day.  3. Zocor 20 mg daily.  4. Mucinex 600 mg two times daily.  5. Synthroid 50 mcg daily.  6. Medrol 4 mg daily.  7. Ativan 0.5 mg three times daily.  8. Lasix 40 mg daily x 5 days.  9. K-Dur 20 mEq daily x 5 days.  10.Prevacid 30 mg daily.  11.Coumadin dose to be determined prior to discharge.  12.Ultram 50 mg 1 every 4-6 hours p.r.n. pain.   DISCHARGE INSTRUCTIONS:  The patient received written instructions regarding  activity, diet and wound care.   FOLLOWUP:  1. 08/27/2006, at  [facility] for PT and INR blood work at 3:15 p.m.  2. Dr. Irish Lack on 09/05/2006, at 3:45.  AP and lateral chest x-ray will      be taken by [facility] on 09/05/2006, at 3 p.m.  3. Dr. Roxy Manns on 09/16/2006, at 12:15 p.m.      Leta Baptist, PA     AY/MEDQ  D:  08/23/2006  T:  08/23/2006  Job:  [id number]   cc:   Gilford Raid, M.D.  Jettie Booze, MD

## 2011-04-13 NOTE — Procedures (Signed)
EEG NUMBER:  [id number].   HISTORY:  This is a 77 year old patient who is being evaluated for black-out  episodes. The patient is being evaluated for possible seizure events. This  is a routine EEG. No skull defects are noted.   MEDICATIONS:  Include levothyroxine, metoprolol, Zofran, Phenergan, Tylenol,  Ativan, methylprednisolone, Zocor and Ambien.   EEG CLASSIFICATION:  Normal awake.   DESCRIPTION:  According to the background rhythms, this recording consists  of a fairly well modulated medium amplitude alpha rhythm that is reactive to  eye opening and closure. Background rhythm activity is about 10 hertz. As  the record progresses, photic stimulation is performed resulting in a  bilateral and symmetric photic driving response. Hyperventilation was not  performed. Throughout the recording, the patient appears to remain in the  waking state. At no time during the recording does there appear to be  evidence of actual spike or spike wave discharges or evidence of focal  slowing. EKG monitor shows no evidence of cardiac rhythm abnormalities with  a heart rate of 84.   IMPRESSION:  This is a normal EEG recording in the waking state. No evidence  of ictal or interictal discharges were seen.      Jill Alexanders, M.D.  Electronically Signed     [id number]  D:  01/28/2006 20:32:06  T:  01/29/2006 14:19:21  Job #:  [id number]

## 2011-04-13 NOTE — H&P (Signed)
[name], [name]                ACCOUNT NO.:  [id number]   MEDICAL RECORD NO.:  [id number]          PATIENT TYPE:  EMS   LOCATION:  MAJO                         FACILITY:  [facility]   PHYSICIAN:  Cheryl L. [name], MD  DATE OF BIRTH:  [birth year]   DATE OF ADMISSION:  01/26/2006  DATE OF DISCHARGE:                                HISTORY & PHYSICAL   CHIEF COMPLAINT:  Nausea, vomiting and diarrhea acute in onset x the last 12  hours.   PRIMARY CARE PHYSICIAN:  Dr. Darletta Thomas. Heller   HISTORY OF PRESENT ILLNESS:  The patient is a 77 year old white female with  a past medical history for polymyalgia rheumatica, hypertension,  hypothyroidism, hypercholesterolemia. The patient recently traveled to  [place] and flew back to [place] and was visiting with her family  when she noticed slight nausea after having a party with them. She went home  and woke up acutely at around 12 p.m. feeling nauseated and started to  vomit. She then developed profuse diarrhea. The patient states while she was  in the bathroom that her daughter came in on at least two occasions and  found her lying on the floor and did not quite know if she had fallen and  fainted or if she had purposely gotten into that position. The patient did  not recall falling on the floor. Therefore she was brought to the emergency  room for further evaluation. In the ED the patient presented with  tachycardia, hypotension, and vomiting and diarrhea x1. The patient was  given medication for her nausea and was rehydrated aggressively with good  results. The patient states that she noticed severe chills, did not measure  her fever but felt warm to the EMS workers who picked her up.   REVIEW OF SYSTEMS:  She has had no recent weight loss, in fact she has  gotten puffy from taking chronic steroids for her polymyalgia rheumatica. As  stated, she recently traveled by plane to [place] and back. She  recently had contact with a  grandchild who has a similar illness. She has  had melena, hematochezia, or hematemesis. All other review of systems appear  to be negative.   PAST MEDICAL HISTORY:  1.  She was recently diagnosed with basilar cell carcinoma on her face and      her leg.  2.  She has polymyalgia rheumatica.  3.  Hiatal hernia.  4.  Hypertension.  5.  Hypercholesterolemia.  6.  Hyperthyroidism status post thyroidectomy.   PAST SURGICAL HISTORY:  Tonsillectomy, hysterectomy with ovaries remaining  intact. She had a heel fracture with pins. She has had a thyroidectomy.   ALLERGIES:  No known drug allergies.   MEDICATIONS:  1.  Medrol 5 mg p.o. daily.  2.  Synthroid 0.5 mcg daily.  3.  Lipitor 10 mg p.o. q.h.s.  4.  Lotrel 10/20 mg p.o. daily.  5.  Prevacid (dose unknown).  6.  Boniva.  7.  Caltrate.   SOCIAL HISTORY:  She denies tobacco. Denies alcohol. 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   FAMILY HISTORY:  Mom is deceased at age 36 from strokes. Dad deceased at 32  secondary to cancer.   PHYSICAL EXAMINATION:  VITAL SIGNS:  T-max was 100.5, lowest blood pressure  was 91/17, blood pressure is now 132/46.  Heart rate is 104, at its highest it was 121. Respiratory rate is 20.  Saturation 94%.  GENERAL:  She has mild muscle pain secondary to excessive retching.  HEENT:  Normocephalic, atraumatic. Pupils are equal, round and reactive to  light. Extraocular movements are intact. She does have a peeling lesion on  her right nasal bridge crossing over to the left side of her nose. Mucous  membranes are dry.  NECK:  Supple, no JVD, no lymphadenopathy. There is a well-healed  thyroidectomy scar.  CHEST:  Clear to auscultation.  CARDIOVASCULAR:  Regular rate and rhythm, positive S1, S2, no S3, S4, no  murmurs, rubs or gallops.  ABDOMEN:  Soft, nondistended with minimal tenderness in the left upper  quadrant. She has positive bowel sounds.  EXTREMITIES:   Show no clubbing, cyanosis or edema.  NEUROLOGIC:  She awake, alert and oriented x3. Cranial nerves II-XII are  intact. Power is 5/5. DTRs are 2+. She has no nystagmus.   LABORATORY DATA:  Urinalysis is negative. White count is 14,000, hemoglobin  12.9, hematocrit 37.6, platelets are 339,000. Sodium is 142, potassium is  4.0, chloride is 112, CO2 is 21, BUN is 28, creatinine is 1.3, glucose 111.  Liver function tests are within normal limits. Albumin is 3.4, lipase is 35.   EKG reveals normal sinus rhythm with a left axis deviation and left bundle  branch block. Unclear whether this represents and old bundle branch block or  a new bundle branch block. We will attempt to obtain an old EKG.   ASSESSMENT AND PLAN:  This is a 77 year old white female with an acute onset  of nausea, vomiting and diarrhea with associated  Fever which likely represents a viral illness. However the differential  diagnoses need to be ruled out.  The patient had two episodes of what appeared to be syncope when she was  found on the bathroom floor.  PROBLEM #1. Cardiovascular. History for hypertension, will hold her  medications for now and gently rehydrate. Need to find and old EKG to  compare with her current changes and will check cardiac markers.  PROBLEM #2. Pulmonary. She has no complaints of shortness of breath. Will  check a chest x-ray to rule out aspiration.  PROBLEM #3. GI. Possible [place] versus other bacterial infection. Will  check stool cultures, isolate the patient and treat symptomatically.  PROBLEM #4. GU. She denies dysuria and the urinalysis is negative. Will  follow her I&Os.  PROBLEM #5. Endocrine. She is on chronic steroids for polymyalgia  rheumatica. I am going to increase the dose for stress. Will check CBGs a.c.  and h.s.  PROBLEM #6. DVT prophylaxis with Lovenox.      Cheryl L. [name], MD  Electronically Signed    MLT/MEDQ  D:  01/26/2006  T:  01/26/2006  Job:  [id number]    cc:   [name]. Bubba Hales, M.D.  Fax: [phone]

## 2011-04-13 NOTE — Discharge Summary (Signed)
[name], [name]                ACCOUNT NO.:  [id number]   MEDICAL RECORD NO.:  [id number]          PATIENT TYPE:  INP   LOCATION:  [id number]                         FACILITY:  [facility]   PHYSICIAN:  Jettie Booze, MDDATE OF BIRTH:  1932/08/11   DATE OF ADMISSION:  07/12/2006  DATE OF DISCHARGE:  07/15/2006                                 DISCHARGE SUMMARY   ADMISSION DIAGNOSIS:  Shortness of breath.   DISCHARGE DIAGNOSES:  1. Shortness of breath consistent with congestive heart failure,      compensated after diuresis with intravenous Lasix 40 mg twice daily      then switched to oral Lasix.  2. Chronic left bundle branch block.  3. Moderate to severe mitral valve regurgitation with a normal left      ventricular function with an ejection fraction of 50-55% via 2-D      echocardiogram January 28, 2006.  4. Hypertension.  5. History of recurrent syncopal episodes.  6. History of intracranial aneurysm status post angiography which showed 7      x 6.8 mm left internal carotid artery, superior hypophyseal aneurysm      and a smaller, about 2 mm, left internal carotid artery canal aneurysm      during hospital admission in [id number].  7. History of gastroenteritis, resolved.  8. History of fatigue of uncertain etiology, rule out chronotropic      dysfunction.  9. Polymyalgia rheumatica.  10.Hypothyroidism.  11.Anemia, stable.   PROCEDURES:  None during this admission.   HOSPITAL COURSE:  [name] is a 77 year old female with a known normal LV  function with moderate mitral regurgitation.  She was admitted to the [facility] with a chief complaint of shortness of breath for 5 days.  Chest x-ray at her primary care physician's office showed pulmonary edema.  Twelve-lead EKG revealed normal sinus rhythm with a ventricular rate of 87  beats per minute with a chronic left bundle branch block.  There were  nonspecific ST segment and T-wave abnormalities without ischemic  changes.  Serial cardiac enzymes x3 were negative with a peak troponin I of 0.03, BNP  was mildly elevated x2 and nonspecific with a peak of 154.  The patient was  started on IV Lasix 40 mg twice daily.  Daily weights were obtained.  The  patient reported feeling much better reporting less shortness of breath with  a nonproductive cough.  She also complained of mild GI upset which resolved.  The patient's shortness of breath resolved with diuresis.  She had no  complaints of chest pain.  The patient's IV Lasix was switched to oral Lasix  and was decreased to 20 mg daily.  On the following day the patient  complained of chest tightness that radiated to her neck.  Cardiac enzymes  were negative as well as the patient's EKG.  The patient's blood pressure  was stable during this admission.  Although the patient has a history of  mitral regurgitation, there was an S3 gallop  by physical exam however, the  murmur was clinically silent by physical examination.  The patient had no  further recurrences of chest pain and was discharged to home in stable  condition on August20,2007 with a prescription for sublingual  nitroglycerin.  Detailed instructions were given on how to take sublingual  nitroglycerin by Dr. Tamala Julian.   LABORATORY DATA:  Sodium 137, potassium 3.7, chloride 100, CO2 27, glucose  80, BUN 29, creatinine 1.0, calcium 9.1.  BNP 154 and 153, respectively.  White blood count 12.5, hemoglobin 11.8, hematocrit 34.8, platelets 354.  Serial cardiac enzymes:  CK total 39, 33, and 27 respectively; CK-MB 2.0,  1.7 and 1.5 respectively; troponin I 0.03, 0.03 and 0.02 respectively.   X-RAYS:  None during this admission.   ELECTROCARDIOGRAM:  EKG July 12, 2006 normal sinus rhythm with a  ventricular rate of 87 beats per minute with nonspecific ST/T-wave  abnormalities.  No ischemic changes.  Chronic left bundle branch block.  The  left bundle branch block was consistent with previous EKG.    CONDITION UPON DISCHARGE:  [name] was discharged to home in stable  condition on July 15, 2006 with no complaints of shortness of breath,  cough, orthopnea, PND, lower extremity edema, chest pain, diaphoresis,  nausea, vomiting, or dizziness.   DISCHARGE MEDICATIONS:  1. Caduet 5/10 mg daily.  2. Furosemide 20 mg daily.  3. Medrol 4 mg as prior to admission.  4. Prevacid 30 mg daily.  5. Lorazepam 0.5 mg three times daily as needed.  6. Caltrate-D twice daily.  7. Synthroid 0.05 mg daily.  8. Sublingual nitroglycerin 0.4 mg placed under tongue as needed for chest      and neck pressure (this represented a new prescription).  Prescription      was given with refills.  9. Potassium chloride 10 mEq one tablet daily.   DISCHARGE INSTRUCTIONS:  The patient was instructed to follow a low-salt  diet.   FOLLOW-UP ARRANGEMENTS:  1. A follow-up appointment was scheduled with Dr. Casandra Doffing at the      [facility] office on August27,2007 at 9:30 a.m..  The      patient was scheduled to call [phone] if she was unable to make the      scheduled appointment.  2. The patient was scheduled for an outpatient adenosine-Cardiolite at the      [facility] office.  She was scheduled to call [phone] if she was      unable to make the scheduled appointment.  3. The patient was scheduled for an outpatient TEE for further evaluation      of mitral regurgitation.      [address] [place], [place]      Jettie Booze, MD  Electronically Signed   RDM/MEDQ  D:  08/01/2006  T:  08/01/2006  Job:  [id number]   cc:   Darletta Moll. Bubba Hales, M.D.

## 2011-04-13 NOTE — Discharge Summary (Signed)
NAMEBRITTNAE, [name]                ACCOUNT NO.:  [id number]   MEDICAL RECORD NO.:  [id number]          PATIENT TYPE:  INP   LOCATION:  [id number]                         FACILITY:  [facility]   PHYSICIAN:  Benito Mccreedy, M.D.DATE OF BIRTH:  [birth year]   DATE OF ADMISSION:  01/26/2006  DATE OF DISCHARGE:  02/01/2006                                 DISCHARGE SUMMARY   DISCHARGE DIAGNOSIS:  1.  Intracranial aneurysm status post angiography this admission which      showed 7 x 6.8 mm left internal carotid artery superior hypophyseal      aneurysm and a smaller, about 2 mm, left internal carotid artery canal      aneurysm.  2.  Gastroenteritis __________-induced, resolved.  3.  Chronic left bundle branch block.  4.  Fatigue of uncertain etiology, rule out chronotropic dysfunction.  5.  History of recurrent syncopal episodes.   DISCHARGE MEDICATIONS:  Orlistat 1 tablet p.o. b.i.d., Levothyroxine 50 mg  daily, Ativan 0.5 mg b.i.d., Medrol 4 mg daily, Lopressor 12.5 mg q.12h.,  multi-vitamins one capsule daily, Protonix 40 mg daily, Zocor 20 mg q.h.s.,  Ambien 5-10 mg p.o. q.h.s. p.r.n.   DISCHARGE LABORATORY DATA:  WBC 10.2, hemoglobin 10.6, hematocrit 31, MCV  90.3, platelet count 304.  Sodium 141, potassium 3.5, chloride 115, CO2 23,  glucose 108, BUN 10, creatinine 1,calcium 8.5.   CONSULTATIONS:  Ashok Pall, M.D., neurosurgery  Belva Crome, M.D., cardiology   PROCEDURES:  As listed above.   CONDITION ON DISCHARGE:  Improved and satisfactory.   REASON FOR ADMISSION:  Gastroenteritis.  The patient presented with nausea,  vomiting, and diarrhea.  Please see H&P dictated by Dr. Billey Chang for  full details regarding the patient's presentation.  She had been found on  the floor and did not recall falling.  There were concerns about possible  syncope.  The patient had a fever of 105 on admission and white count  14,000.  She was thought to have gastroenteritis of viral origin  with  questionable syncopal episode.  The patient was admitted to the hospitalists  service on telemetry bed with IV fluid supplementation, antiemetics, and  other antinauseants.  Stool cultures were negative for any enteric  pathogens.  There was no C. diff toxin from stool or intestine.  Her  electrolytes were followed and appropriately monitored.  The patient had had  a recurrent syncopal episode with fatigue and was seen in consultation by  Dr. Daneen Schick who felt that the patient's fatigue may be related to  chronotropic dysfunction.  Exercise treadmill test was recommended, however,  this could not be done in the hospital and the patient is planned to follow  up with Dr. __________ in the office on discharge for further cardiac  workup.  The patient has been noted to have a neurological exam which was  followed up with angiogram. The patient was seen by Dr. Christella Noa yesterday  and Dr. Christella Noa had extensive discussions with the patient in my presence  and the patient is yet to make up her mind about whether she would want  coiling to be done or not.  __________ does not have any abdominal pain,  nausea or vomiting.  She is sitting  on the bed, her daughter present.  She is alert and oriented x 3.  Her  hemodynamics remain stable and she is to be discharged home with outpatient  follow up as listed above.  The patient is discharged in stable,  satisfactory condition.      Benito Mccreedy, M.D.  Electronically Signed     GO/MEDQ  D:  02/01/2006  T:  02/02/2006  Job:  [id number]   cc:   Belva Crome, M.D.  Fax: [phone]   Ashok Pall, M.D.  Fax: [place]. Bubba Hales, M.D.  Fax: [phone]

## 2011-04-18 ENCOUNTER — Encounter: Payer: Self-pay | Admitting: Internal Medicine

## 2011-04-24 ENCOUNTER — Encounter: Payer: Self-pay | Admitting: Internal Medicine

## 2011-04-24 ENCOUNTER — Ambulatory Visit (INDEPENDENT_AMBULATORY_CARE_PROVIDER_SITE_OTHER): Payer: Medicare Other | Admitting: Internal Medicine

## 2011-04-24 DIAGNOSIS — I10 Essential (primary) hypertension: Secondary | ICD-10-CM | POA: Insufficient documentation

## 2011-04-24 DIAGNOSIS — E785 Hyperlipidemia, unspecified: Secondary | ICD-10-CM

## 2011-04-24 DIAGNOSIS — I5022 Chronic systolic (congestive) heart failure: Secondary | ICD-10-CM | POA: Insufficient documentation

## 2011-04-24 DIAGNOSIS — I428 Other cardiomyopathies: Secondary | ICD-10-CM

## 2011-04-24 DIAGNOSIS — Z95810 Presence of automatic (implantable) cardiac defibrillator: Secondary | ICD-10-CM | POA: Insufficient documentation

## 2011-04-24 NOTE — Progress Notes (Signed)
HPI [name] is referred today by Dr. Irish Lack for ongoing ICD evaluation and management. She has a history of an ischemic cardiomyopathy, congestive heart failure, left bundle branch block, status post BIV ICD. She has multiple other medical problems. These include severe arthritis, spinal fractures, and chronic steroid use. Allergies  Allergen Reactions  . Amoxicillin   . Sulfa Antibiotics      Current Outpatient Prescriptions  Medication Sig Dispense Refill  . aspirin 81 MG tablet Take 81 mg by mouth daily.        . Calcium Carbonate (CALTRATE 600 PO) Take by mouth 2 (two) times daily.        . Cholecalciferol (VITAMIN D3) 1000 UNITS CAPS Take by mouth daily.        . furosemide (LASIX) 40 MG tablet Take 40 mg by mouth. 1 qam and 1/2 po qhs      . HYDROcodone-acetaminophen (VICODIN) 5-500 MG per tablet Take 1 tablet by mouth every 6 (six) hours as needed.        Marland Kitchen levothyroxine (SYNTHROID, LEVOTHROID) 75 MCG tablet Take 75 mcg by mouth daily.        Marland Kitchen lisinopril (PRINIVIL,ZESTRIL) 5 MG tablet Take 5 mg by mouth daily.        Marland Kitchen LORazepam (ATIVAN) 0.5 MG tablet Take 0.5 mg by mouth 2 (two) times daily.        . methylPREDNISolone (MEDROL) 2 MG tablet Take 2 mg by mouth daily.        . metoprolol (TOPROL-XL) 100 MG 24 hr tablet Take 100 mg by mouth daily.        . Multiple Vitamin (MULTIVITAMIN PO) Take by mouth daily.        Marland Kitchen omeprazole (PRILOSEC OTC) 20 MG tablet Take 20 mg by mouth daily.        . potassium chloride (KLOR-CON) 20 MEQ packet 1 in am and 1/2 in pm       . simvastatin (ZOCOR) 40 MG tablet Take 40 mg by mouth at bedtime.           Past Medical History  Diagnosis Date  . GERD (gastroesophageal reflux disease)   . Chronic low back pain   . Polymyalgia   . Insomnia   . Anxiety   . Hyperlipidemia   . HTN (hypertension)   . Hypothyroidism   . Chronic anemia   . Systolic heart failure   . Pacemaker     ROS:   All systems reviewed and negative except as noted  in the HPI.   Past Surgical History  Procedure Date  . Tonsillectomy   . Abdominal hysterectomy   . Foot surgery   . Thyroidectomy   . Mitral valve repair      Family History  Problem Relation Age of Onset  . Colon cancer Brother   . Colon cancer Sister   . Breast cancer Sister      History   Social History  . Marital Status: Widowed    Spouse Name: N/A    Number of Children: N/A  . Years of Education: N/A   Occupational History  . Not on file.   Social History Main Topics  . Smoking status: Never Smoker   . Smokeless tobacco: Not on file  . Alcohol Use: No  . Drug Use: Not on file  . Sexually Active: Not on file   Other Topics Concern  . Not on file   Social History Narrative  . No narrative on  file     BP 144/72  Pulse 100  Resp 16  Ht 4\' 10"  (1.473 m)  Wt 134 lb (60.782 kg)  BMI 28.01 kg/m2  Physical Exam:  Chronically ill appearing NAD HEENT: Unremarkable Neck:  No JVD, no thyromegally Lymphatics:  No adenopathy Back:  No CVA tenderness Lungs:  Clear. Well-healed ICD incision. HEART:  Regular rate rhythm, no murmurs, no rubs, no clicks Abd:  obese, positive bowel sounds, no organomegally, no rebound, no guarding Ext:  2 plus pulses, no edema, no cyanosis, no clubbing Skin:  No rashes no nodules Neuro:  CN II through XII intact, motor grossly intact  DEVICE  Normal device function.  See PaceArt for details.   Assess/Plan:

## 2011-04-24 NOTE — Assessment & Plan Note (Signed)
Her symptoms are class II. She reports that her LV function improved after by the ICD. She'll continue a low sodium diet.

## 2011-04-24 NOTE — Assessment & Plan Note (Signed)
Her blood pressure is slightly elevated today. I have asked that she maintain a low-salt diet, and continue her current medications.

## 2011-04-24 NOTE — Assessment & Plan Note (Signed)
Her device is working normally. Will recheck in several months. 

## 2011-04-24 NOTE — Patient Instructions (Signed)
Your physician wants you to follow-up in: 12 months with Dr. Taylor. You will receive a reminder letter in the mail two months in advance. If you don't receive a letter, please call our office to schedule the follow-up appointment.    

## 2011-05-01 ENCOUNTER — Ambulatory Visit: Payer: Self-pay | Admitting: Internal Medicine

## 2011-07-26 ENCOUNTER — Encounter: Payer: Medicare Other | Admitting: *Deleted

## 2011-07-31 ENCOUNTER — Encounter: Payer: Self-pay | Admitting: *Deleted

## 2011-08-04 ENCOUNTER — Other Ambulatory Visit: Payer: Self-pay | Admitting: Internal Medicine

## 2011-08-04 ENCOUNTER — Encounter: Payer: Self-pay | Admitting: Internal Medicine

## 2011-08-06 ENCOUNTER — Ambulatory Visit (INDEPENDENT_AMBULATORY_CARE_PROVIDER_SITE_OTHER): Payer: Medicare Other | Admitting: *Deleted

## 2011-08-06 DIAGNOSIS — Z95810 Presence of automatic (implantable) cardiac defibrillator: Secondary | ICD-10-CM

## 2011-08-06 DIAGNOSIS — I5022 Chronic systolic (congestive) heart failure: Secondary | ICD-10-CM

## 2011-08-06 DIAGNOSIS — I428 Other cardiomyopathies: Secondary | ICD-10-CM

## 2011-08-07 LAB — REMOTE ICD DEVICE
AL AMPLITUDE: 3.1 mv
BATTERY VOLTAGE: 2.62 V
BRDY-0003LV: 145 {beats}/min
CHARGE TIME: 12.842 s
FVT: 0
LV LEAD THRESHOLD: 1 V
RV LEAD IMPEDENCE ICD: 560 Ohm
TOT-0001: 1
TZAT-0001ATACH: 1
TZAT-0002ATACH: NEGATIVE
TZAT-0002ATACH: NEGATIVE
TZAT-0002ATACH: NEGATIVE
TZAT-0002FASTVT: NEGATIVE
TZAT-0005SLOWVT: 88 pct
TZAT-0005SLOWVT: 91 pct
TZAT-0011SLOWVT: 10 ms
TZAT-0011SLOWVT: 10 ms
TZAT-0012ATACH: 150 ms
TZAT-0012SLOWVT: 200 ms
TZAT-0013SLOWVT: 2
TZAT-0013SLOWVT: 2
TZAT-0018ATACH: NEGATIVE
TZAT-0018ATACH: NEGATIVE
TZAT-0018ATACH: NEGATIVE
TZAT-0018SLOWVT: NEGATIVE
TZAT-0018SLOWVT: NEGATIVE
TZAT-0019ATACH: 6 V
TZAT-0019ATACH: 6 V
TZAT-0019FASTVT: 8 V
TZAT-0020ATACH: 1.5 ms
TZAT-0020FASTVT: 1.5 ms
TZON-0003ATACH: 350 ms
TZON-0004SLOWVT: 16
TZON-0005SLOWVT: 12
TZST-0001ATACH: 4
TZST-0001ATACH: 5
TZST-0001FASTVT: 3
TZST-0001SLOWVT: 4
TZST-0001SLOWVT: 6
TZST-0002ATACH: NEGATIVE
TZST-0002FASTVT: NEGATIVE
TZST-0002FASTVT: NEGATIVE
TZST-0002FASTVT: NEGATIVE
TZST-0003SLOWVT: 25 J
TZST-0003SLOWVT: 35 J
VF: 0

## 2011-08-13 NOTE — Progress Notes (Signed)
icd remote check  

## 2011-08-15 ENCOUNTER — Ambulatory Visit (INDEPENDENT_AMBULATORY_CARE_PROVIDER_SITE_OTHER): Payer: Medicare Other | Admitting: Internal Medicine

## 2011-08-15 ENCOUNTER — Encounter: Payer: Self-pay | Admitting: *Deleted

## 2011-08-15 ENCOUNTER — Encounter: Payer: Self-pay | Admitting: Internal Medicine

## 2011-08-15 DIAGNOSIS — I5022 Chronic systolic (congestive) heart failure: Secondary | ICD-10-CM

## 2011-08-15 DIAGNOSIS — Z95810 Presence of automatic (implantable) cardiac defibrillator: Secondary | ICD-10-CM

## 2011-08-15 NOTE — Assessment & Plan Note (Signed)
Her device has reached elective replacement indication. We will schedule ICD generator change next few weeks.

## 2011-08-15 NOTE — Progress Notes (Signed)
HPI [name] returns today for followup. She is a pleasant 77 year old woman with an ischemic cardiomyopathy, chronic systolic heart failure, left bundle branch block, status post biventricular ICD implantation. The patient complains of fatigue and weakness. She is not sure but thinks it may be related to increase of her beta blocker therapy. Her heart failure is class 2-3. She denies anginal symptoms. She has had no ICD shocks. Allergies  Allergen Reactions  . Amoxicillin   . Sulfa Antibiotics      Current Outpatient Prescriptions  Medication Sig Dispense Refill  . aspirin 81 MG tablet Take 81 mg by mouth daily.        . Calcium Carbonate (CALTRATE 600 PO) Take by mouth 2 (two) times daily.        . Cholecalciferol (VITAMIN D3) 1000 UNITS CAPS Take by mouth daily.        . furosemide (LASIX) 40 MG tablet Take 40 mg by mouth. 1 qam and 1/2 po qhs      . HYDROcodone-acetaminophen (VICODIN) 5-500 MG per tablet Take 1 tablet by mouth every 6 (six) hours as needed.        Marland Kitchen levothyroxine (SYNTHROID, LEVOTHROID) 75 MCG tablet Take 75 mcg by mouth daily.        Marland Kitchen lisinopril (PRINIVIL,ZESTRIL) 5 MG tablet Take 5 mg by mouth daily.        Marland Kitchen LORazepam (ATIVAN) 0.5 MG tablet Take 0.5 mg by mouth 2 (two) times daily.        . methylPREDNISolone (MEDROL) 2 MG tablet Take 2 mg by mouth daily.        . metoprolol (TOPROL-XL) 100 MG 24 hr tablet Take 100 mg by mouth daily.        . Multiple Vitamin (MULTIVITAMIN PO) Take by mouth daily.        Marland Kitchen omeprazole (PRILOSEC OTC) 20 MG tablet Take 20 mg by mouth daily.        . potassium chloride (KLOR-CON) 20 MEQ packet 1 in am and 1/2 in pm       . simvastatin (ZOCOR) 40 MG tablet Take 40 mg by mouth at bedtime.           Past Medical History  Diagnosis Date  . GERD (gastroesophageal reflux disease)   . Chronic low back pain   . Polymyalgia   . Insomnia   . Anxiety   . Hyperlipidemia   . HTN (hypertension)   . Hypothyroidism   . Chronic anemia     . Systolic heart failure   . Pacemaker     ROS:   All systems reviewed and negative except as noted in the HPI.   Past Surgical History  Procedure Date  . Tonsillectomy   . Abdominal hysterectomy   . Foot surgery   . Thyroidectomy   . Mitral valve repair      Family History  Problem Relation Age of Onset  . Colon cancer Brother   . Colon cancer Sister   . Breast cancer Sister      History   Social History  . Marital Status: Widowed    Spouse Name: N/A    Number of Children: N/A  . Years of Education: N/A   Occupational History  . Not on file.   Social History Main Topics  . Smoking status: Never Smoker   . Smokeless tobacco: Not on file  . Alcohol Use: No  . Drug Use: Not on file  . Sexually Active: Not on  file   Other Topics Concern  . Not on file   Social History Narrative  . No narrative on file     BP 140/64  Ht 5' (1.524 m)  Wt 131 lb (59.421 kg)  BMI 25.58 kg/m2  Physical Exam:  Well appearing only woman, NAD HEENT: Unremarkable Neck:  No JVD, no thyromegally Lymphatics:  No adenopathy Back:  No CVA tenderness Lungs:  Clear. No wheezes, rales, or rhonchi. Well-healed ICD incision. HEART:  Regular rate rhythm, no murmurs, no rubs, no clicks Abd:  soft, positive bowel sounds, no organomegally, no rebound, no guarding Ext:  2 plus pulses, no edema, no cyanosis, no clubbing Skin:  No rashes no nodules Neuro:  CN II through XII intact, motor grossly intact  DEVICE  Normal device function.  See PaceArt for details.   Assess/Plan:

## 2011-08-15 NOTE — Assessment & Plan Note (Signed)
Her symptoms are class 2-3. She is largely fatigue and I recommended that she reduce her dose of Toprol to half tablet daily. We'll see how she does with this.

## 2011-08-16 ENCOUNTER — Other Ambulatory Visit: Payer: Self-pay | Admitting: *Deleted

## 2011-08-16 ENCOUNTER — Encounter: Payer: Self-pay | Admitting: *Deleted

## 2011-08-16 DIAGNOSIS — I509 Heart failure, unspecified: Secondary | ICD-10-CM

## 2011-08-16 NOTE — Progress Notes (Signed)
Preop orders

## 2011-08-23 ENCOUNTER — Telehealth: Payer: Self-pay | Admitting: Internal Medicine

## 2011-08-23 NOTE — Telephone Encounter (Signed)
Returned call to patient and let her know to come here for labs

## 2011-08-23 NOTE — Telephone Encounter (Signed)
Pt called. She has question about lab work to be done before her procedure on [phone] please call

## 2011-08-27 LAB — DIFFERENTIAL
Basophils Relative: 1
Lymphocytes Relative: 31
Lymphs Abs: 3.9
Monocytes Absolute: 1.1 — ABNORMAL HIGH
Monocytes Relative: 9
Neutro Abs: 7.2
Neutrophils Relative %: 57

## 2011-08-27 LAB — COMPREHENSIVE METABOLIC PANEL
Albumin: 3.1 — ABNORMAL LOW
Alkaline Phosphatase: 70
BUN: 36 — ABNORMAL HIGH
Calcium: 8.5
Creatinine, Ser: 1.28 — ABNORMAL HIGH
Glucose, Bld: 104 — ABNORMAL HIGH
Potassium: 4.4
Total Protein: 5.6 — ABNORMAL LOW

## 2011-08-27 LAB — URINE MICROSCOPIC-ADD ON

## 2011-08-27 LAB — CBC
HCT: 30.9 — ABNORMAL LOW
Hemoglobin: 10.2 — ABNORMAL LOW
MCHC: 33
MCV: 95.1
Platelets: 268
RDW: 13.4

## 2011-08-27 LAB — URINALYSIS, ROUTINE W REFLEX MICROSCOPIC
Bilirubin Urine: NEGATIVE
Glucose, UA: NEGATIVE
Hgb urine dipstick: NEGATIVE
Specific Gravity, Urine: 1.012
Urobilinogen, UA: 0.2
pH: 5

## 2011-09-04 ENCOUNTER — Ambulatory Visit (INDEPENDENT_AMBULATORY_CARE_PROVIDER_SITE_OTHER): Payer: Medicare Other | Admitting: *Deleted

## 2011-09-04 DIAGNOSIS — I509 Heart failure, unspecified: Secondary | ICD-10-CM

## 2011-09-04 LAB — PROTIME-INR: Prothrombin Time: 10.9 s (ref 10.2–12.4)

## 2011-09-04 LAB — CBC WITH DIFFERENTIAL/PLATELET
Basophils Relative: 0.7 % (ref 0.0–3.0)
Eosinophils Absolute: 0.2 10*3/uL (ref 0.0–0.7)
Eosinophils Relative: 1.8 % (ref 0.0–5.0)
Lymphocytes Relative: 25.3 % (ref 12.0–46.0)
MCV: 93.4 fl (ref 78.0–100.0)
Monocytes Absolute: 0.9 10*3/uL (ref 0.1–1.0)
Neutrophils Relative %: 65.2 % (ref 43.0–77.0)
Platelets: 305 10*3/uL (ref 150.0–400.0)
RBC: 3.67 Mil/uL — ABNORMAL LOW (ref 3.87–5.11)
WBC: 13.4 10*3/uL — ABNORMAL HIGH (ref 4.5–10.5)

## 2011-09-04 LAB — BASIC METABOLIC PANEL
Chloride: 105 mEq/L (ref 96–112)
GFR: 35.73 mL/min — ABNORMAL LOW (ref >60.00)
Potassium: 3.6 mEq/L (ref 3.5–5.1)
Sodium: 142 mEq/L (ref 135–145)

## 2011-09-11 ENCOUNTER — Ambulatory Visit (HOSPITAL_COMMUNITY)
Admission: RE | Admit: 2011-09-11 | Discharge: 2011-09-11 | Disposition: A | Discharge status: Home / Self Care | Payer: Medicare Other | Source: Ambulatory Visit | Attending: Internal Medicine | Admitting: Internal Medicine

## 2011-09-11 ENCOUNTER — Ambulatory Visit (HOSPITAL_COMMUNITY): Payer: Medicare Other

## 2011-09-11 DIAGNOSIS — I2589 Other forms of chronic ischemic heart disease: Secondary | ICD-10-CM | POA: Insufficient documentation

## 2011-09-11 DIAGNOSIS — Z01811 Encounter for preprocedural respiratory examination: Secondary | ICD-10-CM

## 2011-09-11 DIAGNOSIS — I5022 Chronic systolic (congestive) heart failure: Secondary | ICD-10-CM | POA: Insufficient documentation

## 2011-09-11 DIAGNOSIS — I447 Left bundle-branch block, unspecified: Secondary | ICD-10-CM | POA: Insufficient documentation

## 2011-09-11 DIAGNOSIS — Z4502 Encounter for adjustment and management of automatic implantable cardiac defibrillator: Secondary | ICD-10-CM

## 2011-09-11 DIAGNOSIS — I509 Heart failure, unspecified: Secondary | ICD-10-CM | POA: Insufficient documentation

## 2011-09-11 LAB — SURGICAL PCR SCREEN
MRSA, PCR: NEGATIVE
Staphylococcus aureus: NEGATIVE

## 2011-09-23 NOTE — Op Note (Signed)
  [name], [name] NO.:  [id number]  MEDICAL RECORD NO.:  [id number]  LOCATION:  [facility]                         FACILITY:  [facility]  PHYSICIAN:  Champ Mungo. [name], MD    DATE OF BIRTH:  01-03-1932  DATE OF PROCEDURE:  09/11/2011 DATE OF DISCHARGE:                              OPERATIVE REPORT   PROCEDURE PERFORMED:  Removal of previously implanted biventricular implantable cardioverter-defibrillator which had reached elective replacement and insertion of a new biventricular implantable cardioverter-defibrillator with defibrillation threshold testing.  INTRODUCTION:  The patient is a 77 year old woman with a history of an ischemic cardiomyopathy, chronic systolic heart failure, and left bundle- branch block.  She is status post MI.  She is status post BiV ICD insertion approximately 4-1/2 years ago.  She is now at elective replacement indication and referred for ICD generator removal and insertion of a new device.  PROCEDURE:  After informed consent was obtained, the patient was taken to the Diagnostic EP Lab in a fasting state.  After usual preparation and draping, intravenous fentanyl and midazolam was given for sedation. 30 mL of lidocaine was infiltrated into the left infraclavicular region. A 7-cm incision was carried out over this region.  Electrocautery was utilized to dissect down to the fascia plane.  The ICD pocket was entered with electrocautery and electrocautery was utilized to free up the dense fibrous adhesions.  The generator was removed without difficulty.  Electrocautery was utilized to free up the leads without difficulty.  The pocket was irrigated with antibiotic irrigation.  At this point, the old Medtronic Concerto BiV ICD was removed and the new Medtronic BiV ICD, serial number [id number] was connected to the old atrial RV and LV leads and placed back into the subcutaneous pocket. The pocket was irrigated with additional antibiotic  irrigation.  The incision was closed with 2-0 and 3-0 Vicryl.  Benzoin and Steri-Strips were painted on the skin, and the patient was more deeply sedated for defibrillation threshold testing.  After the patient was more deeply sedated with fentanyl and Versed under my immediate supervision, VF was induced with a T-wave shock.  A 20- joule shock was subsequently delivered, which terminated ventricular fibrillation and restored sinus rhythm.  The patient was monitored following the procedure until she was awoke.  Oxygen saturation and blood pressure were stable during this period.  COMPLICATIONS:  There were no immediate procedure complications.  RESULTS:  This demonstrates successful removal of previous implanted Medtronic BiV ICD and insertion of a new BiV ICD without immediate procedure complication.  The defibrillation threshold was 20 joules.     Champ Mungo. [name], MD     GWT/MEDQ  D:  09/11/2011  T:  09/11/2011  Job:  [id number]  Electronically Signed by Cristopher Peru MD on 09/23/2011 12:18:02 PM

## 2011-09-24 ENCOUNTER — Encounter: Payer: Self-pay | Admitting: Internal Medicine

## 2011-09-24 ENCOUNTER — Ambulatory Visit (INDEPENDENT_AMBULATORY_CARE_PROVIDER_SITE_OTHER): Payer: Medicare Other | Admitting: *Deleted

## 2011-09-24 DIAGNOSIS — I428 Other cardiomyopathies: Secondary | ICD-10-CM

## 2011-09-24 LAB — ICD DEVICE OBSERVATION
AL AMPLITUDE: 3.75 mv
AL IMPEDENCE ICD: 380 Ohm
ATRIAL PACING ICD: 0.55 pct
CHARGE TIME: 3.513 s
LV LEAD IMPEDENCE ICD: 779 Ohm
LV LEAD THRESHOLD: 1.25 V
RV LEAD IMPEDENCE ICD: 456 Ohm
TOT-0001: 1
TOT-0002: 0
TOT-0006: 20121016000000
TZAT-0001FASTVT: 1
TZAT-0002ATACH: NEGATIVE
TZAT-0002FASTVT: NEGATIVE
TZAT-0011SLOWVT: 10 ms
TZAT-0011SLOWVT: 10 ms
TZAT-0012SLOWVT: 170 ms
TZAT-0012SLOWVT: 170 ms
TZAT-0018ATACH: NEGATIVE
TZAT-0018ATACH: NEGATIVE
TZAT-0018SLOWVT: NEGATIVE
TZAT-0018SLOWVT: NEGATIVE
TZAT-0019ATACH: 6 V
TZAT-0019ATACH: 6 V
TZAT-0019SLOWVT: 8 V
TZAT-0019SLOWVT: 8 V
TZAT-0020ATACH: 1.5 ms
TZAT-0020ATACH: 1.5 ms
TZAT-0020FASTVT: 1.5 ms
TZON-0005SLOWVT: 12
TZST-0001ATACH: 5
TZST-0001FASTVT: 3
TZST-0001FASTVT: 4
TZST-0001FASTVT: 5
TZST-0001SLOWVT: 4
TZST-0002ATACH: NEGATIVE
TZST-0002ATACH: NEGATIVE
TZST-0002FASTVT: NEGATIVE
TZST-0003SLOWVT: 35 J

## 2011-09-24 NOTE — Progress Notes (Signed)
icd check w/icm

## 2011-11-28 DIAGNOSIS — M76899 Other specified enthesopathies of unspecified lower limb, excluding foot: Secondary | ICD-10-CM | POA: Diagnosis not present

## 2011-11-28 DIAGNOSIS — M546 Pain in thoracic spine: Secondary | ICD-10-CM | POA: Diagnosis not present

## 2011-12-26 ENCOUNTER — Encounter: Payer: Self-pay | Admitting: Internal Medicine

## 2012-01-01 ENCOUNTER — Encounter: Payer: Medicare Other | Admitting: Internal Medicine

## 2012-01-22 DIAGNOSIS — I10 Essential (primary) hypertension: Secondary | ICD-10-CM | POA: Diagnosis not present

## 2012-01-22 DIAGNOSIS — I428 Other cardiomyopathies: Secondary | ICD-10-CM | POA: Diagnosis not present

## 2012-01-22 DIAGNOSIS — I059 Rheumatic mitral valve disease, unspecified: Secondary | ICD-10-CM | POA: Diagnosis not present

## 2012-01-22 DIAGNOSIS — E782 Mixed hyperlipidemia: Secondary | ICD-10-CM | POA: Diagnosis not present

## 2012-01-25 DIAGNOSIS — N3090 Cystitis, unspecified without hematuria: Secondary | ICD-10-CM | POA: Diagnosis not present

## 2012-01-25 DIAGNOSIS — R300 Dysuria: Secondary | ICD-10-CM | POA: Diagnosis not present

## 2012-01-28 ENCOUNTER — Encounter: Payer: Self-pay | Admitting: Internal Medicine

## 2012-01-28 ENCOUNTER — Ambulatory Visit (INDEPENDENT_AMBULATORY_CARE_PROVIDER_SITE_OTHER): Payer: Medicare Other | Admitting: Internal Medicine

## 2012-01-28 DIAGNOSIS — I10 Essential (primary) hypertension: Secondary | ICD-10-CM

## 2012-01-28 DIAGNOSIS — I5022 Chronic systolic (congestive) heart failure: Secondary | ICD-10-CM | POA: Diagnosis not present

## 2012-01-28 DIAGNOSIS — Z95810 Presence of automatic (implantable) cardiac defibrillator: Secondary | ICD-10-CM | POA: Diagnosis not present

## 2012-01-28 LAB — ICD DEVICE OBSERVATION
AL IMPEDENCE ICD: 380 Ohm
ATRIAL PACING ICD: 2.52 pct
BAMS-0001: 170 {beats}/min
CHARGE TIME: 3.513 s
FVT: 0
LV LEAD THRESHOLD: 1 V
PACEART VT: 0
TOT-0001: 1
TOT-0002: 0
TZAT-0001ATACH: 1
TZAT-0001FASTVT: 1
TZAT-0002ATACH: NEGATIVE
TZAT-0004SLOWVT: 8
TZAT-0004SLOWVT: 8
TZAT-0011SLOWVT: 10 ms
TZAT-0011SLOWVT: 10 ms
TZAT-0012ATACH: 150 ms
TZAT-0012ATACH: 150 ms
TZAT-0012ATACH: 150 ms
TZAT-0012SLOWVT: 170 ms
TZAT-0012SLOWVT: 170 ms
TZAT-0013SLOWVT: 2
TZAT-0013SLOWVT: 2
TZAT-0018ATACH: NEGATIVE
TZAT-0018ATACH: NEGATIVE
TZAT-0020ATACH: 1.5 ms
TZAT-0020ATACH: 1.5 ms
TZAT-0020ATACH: 1.5 ms
TZAT-0020FASTVT: 1.5 ms
TZAT-0020SLOWVT: 1.5 ms
TZAT-0020SLOWVT: 1.5 ms
TZON-0003ATACH: 350 ms
TZON-0003SLOWVT: 350 ms
TZON-0003VSLOWVT: 450 ms
TZON-0004SLOWVT: 16
TZST-0001ATACH: 4
TZST-0001FASTVT: 2
TZST-0001FASTVT: 4
TZST-0001FASTVT: 6
TZST-0001SLOWVT: 4
TZST-0001SLOWVT: 6
TZST-0002ATACH: NEGATIVE
TZST-0002ATACH: NEGATIVE
TZST-0002FASTVT: NEGATIVE
TZST-0002FASTVT: NEGATIVE
TZST-0003SLOWVT: 35 J
TZST-0003SLOWVT: 35 J
TZST-0003SLOWVT: 35 J
VENTRICULAR PACING ICD: 99.52 pct

## 2012-01-28 NOTE — Progress Notes (Signed)
HPI  [name]  returns today for followup. She is a very pleasant 78 year old woman with a long-standing dilated cardiomyopathy, chronic systolic heart failure, status post biventricular ICD implantation. The patient denies chest pain or shortness of breath. No syncope and no recent ICD shocks. Allergies  Allergen Reactions  . Amoxicillin   . Sulfa Antibiotics      Current Outpatient Prescriptions  Medication Sig Dispense Refill  . aspirin 81 MG tablet Take 81 mg by mouth daily.        Marland Kitchen atorvastatin (LIPITOR) 20 MG tablet Take 20 mg by mouth daily.      . Calcium Carbonate (CALTRATE 600 PO) Take by mouth 2 (two) times daily.        . Cholecalciferol (VITAMIN D3) 1000 UNITS CAPS Take by mouth daily.        . furosemide (LASIX) 40 MG tablet Take 40 mg by mouth. 1 qam and 1/2 po qhs      . HYDROcodone-acetaminophen (VICODIN) 5-500 MG per tablet Take 1 tablet by mouth every 6 (six) hours as needed.        Marland Kitchen levothyroxine (SYNTHROID, LEVOTHROID) 75 MCG tablet Take 75 mcg by mouth daily.        Marland Kitchen lisinopril (PRINIVIL,ZESTRIL) 5 MG tablet Take 5 mg by mouth daily.        Marland Kitchen LORazepam (ATIVAN) 0.5 MG tablet Take 0.5 mg by mouth 2 (two) times daily.        . methylPREDNISolone (MEDROL) 2 MG tablet Take 2 mg by mouth daily.        . metoprolol (TOPROL-XL) 100 MG 24 hr tablet Take 100 mg by mouth daily.        . Multiple Vitamin (MULTIVITAMIN PO) Take by mouth daily.        Marland Kitchen omeprazole (PRILOSEC OTC) 20 MG tablet Take 20 mg by mouth daily.        . potassium chloride (KLOR-CON) 20 MEQ packet 1 in am and 1/2 in pm          Past Medical History  Diagnosis Date  . GERD (gastroesophageal reflux disease)   . Chronic low back pain   . Polymyalgia     rheumatica  . Insomnia   . Anxiety   . Hyperlipidemia   . HTN (hypertension)   . Hypothyroidism   . Chronic anemia   . Systolic heart failure   . Pacemaker   . Hiatal hernia   . Basal cell carcinoma   . Hypercholesteremia     ROS:   All systems reviewed and negative except as noted in the HPI.   Past Surgical History  Procedure Date  . Tonsillectomy   . Abdominal hysterectomy   . Foot surgery   . Thyroidectomy   . Mitral valve repair   . Cardiac catheterization 10/16/06     Family History  Problem Relation Age of Onset  . Colon cancer Brother   . Colon cancer Sister   . Breast cancer Sister   . Stroke Mother     in her 53's  . Cancer Father 13    secondary     History   Social History  . Marital Status: Widowed    Spouse Name: N/A    Number of Children: N/A  . Years of Education: N/A   Occupational History  . Not on file.   Social History Main Topics  . Smoking status: Never Smoker   . Smokeless tobacco: Not on file  . Alcohol  Use: No  . Drug Use: Not on file  . Sexually Active: Not on file   Other Topics Concern  . Not on file   Social History Narrative  . No narrative on file     BP 140/74  Pulse 90  Ht 4\' 7"  (1.397 m)  Wt 59.784 kg (131 lb 12.8 oz)  BMI 30.63 kg/m2  Physical Exam:  Well appearing 78 year old woman, NAD HEENT: Unremarkable Neck:  No JVD, no thyromegally Lungs:  Clear with no wheezes, rales, or rhonchi. HEART:  Regular rate rhythm, no murmurs, no rubs, no clicks Abd:  soft, positive bowel sounds, no organomegally, no rebound, no guarding Ext:  2 plus pulses, no edema, no cyanosis, no clubbing Skin:  No rashes no nodules Neuro:  CN II through XII intact, motor grossly intact   DEVICE  Normal device function.  See PaceArt for details.   Assess/Plan:

## 2012-01-28 NOTE — Assessment & Plan Note (Signed)
Her blood pressure is fairly well controlled. No change in medical therapy at this time.

## 2012-01-28 NOTE — Patient Instructions (Signed)
Remote monitoring is used to monitor your Pacemaker of ICD from home. This monitoring reduces the number of office visits required to check your device to one time per year. It allows [place] to keep an eye on the functioning of your device to ensure it is working properly. You are scheduled for a device check from home on 05/01/12. You may send your transmission at any time that day. If you have a wireless device, the transmission will be sent automatically. After your physician reviews your transmission, you will receive a postcard with your next transmission date.  Your physician wants you to follow-up in: 12 months. You will receive a reminder letter in the mail two months in advance. If you don't receive a letter, please call our office to schedule the follow-up appointment.

## 2012-01-28 NOTE — Assessment & Plan Note (Signed)
Her symptoms remain class II. She will continue her current medical therapy and I've asked that she reduce her salt intake.

## 2012-02-07 DIAGNOSIS — C44319 Basal cell carcinoma of skin of other parts of face: Secondary | ICD-10-CM | POA: Diagnosis not present

## 2012-02-07 DIAGNOSIS — D485 Neoplasm of uncertain behavior of skin: Secondary | ICD-10-CM | POA: Diagnosis not present

## 2012-02-25 DIAGNOSIS — M546 Pain in thoracic spine: Secondary | ICD-10-CM | POA: Diagnosis not present

## 2012-04-22 DIAGNOSIS — M353 Polymyalgia rheumatica: Secondary | ICD-10-CM | POA: Diagnosis not present

## 2012-04-22 DIAGNOSIS — M810 Age-related osteoporosis without current pathological fracture: Secondary | ICD-10-CM | POA: Diagnosis not present

## 2012-05-01 ENCOUNTER — Ambulatory Visit (INDEPENDENT_AMBULATORY_CARE_PROVIDER_SITE_OTHER): Payer: Medicare Other | Admitting: *Deleted

## 2012-05-01 ENCOUNTER — Encounter: Payer: Self-pay | Admitting: Internal Medicine

## 2012-05-01 DIAGNOSIS — I5022 Chronic systolic (congestive) heart failure: Secondary | ICD-10-CM | POA: Diagnosis not present

## 2012-05-01 DIAGNOSIS — Z95810 Presence of automatic (implantable) cardiac defibrillator: Secondary | ICD-10-CM | POA: Diagnosis not present

## 2012-05-06 LAB — REMOTE ICD DEVICE
AL THRESHOLD: 0.5 V
BAMS-0001: 170 {beats}/min
BATTERY VOLTAGE: 3.1662 V
FVT: 0
LV LEAD THRESHOLD: 1.25 V
PACEART VT: 0
RV LEAD AMPLITUDE: 16.6 mv
RV LEAD THRESHOLD: 1.875 V
TZAT-0001ATACH: 2
TZAT-0001ATACH: 3
TZAT-0001SLOWVT: 2
TZAT-0004SLOWVT: 8
TZAT-0004SLOWVT: 8
TZAT-0012ATACH: 150 ms
TZAT-0012ATACH: 150 ms
TZAT-0012FASTVT: 170 ms
TZAT-0012SLOWVT: 170 ms
TZAT-0012SLOWVT: 170 ms
TZAT-0018ATACH: NEGATIVE
TZAT-0018FASTVT: NEGATIVE
TZAT-0019FASTVT: 8 V
TZAT-0020ATACH: 1.5 ms
TZAT-0020ATACH: 1.5 ms
TZAT-0020FASTVT: 1.5 ms
TZAT-0020SLOWVT: 1.5 ms
TZAT-0020SLOWVT: 1.5 ms
TZON-0003ATACH: 350 ms
TZON-0003SLOWVT: 350 ms
TZON-0003VSLOWVT: 450 ms
TZON-0004VSLOWVT: 20
TZST-0001ATACH: 4
TZST-0001ATACH: 6
TZST-0001FASTVT: 2
TZST-0001FASTVT: 4
TZST-0001FASTVT: 6
TZST-0001SLOWVT: 3
TZST-0001SLOWVT: 5
TZST-0002FASTVT: NEGATIVE
TZST-0003SLOWVT: 35 J
TZST-0003SLOWVT: 35 J

## 2012-05-19 ENCOUNTER — Encounter: Payer: Self-pay | Admitting: *Deleted

## 2012-06-03 DIAGNOSIS — M546 Pain in thoracic spine: Secondary | ICD-10-CM | POA: Diagnosis not present

## 2012-06-24 DIAGNOSIS — M810 Age-related osteoporosis without current pathological fracture: Secondary | ICD-10-CM | POA: Diagnosis not present

## 2012-07-03 DIAGNOSIS — M810 Age-related osteoporosis without current pathological fracture: Secondary | ICD-10-CM | POA: Diagnosis not present

## 2012-08-11 ENCOUNTER — Encounter: Payer: Self-pay | Admitting: Internal Medicine

## 2012-08-11 ENCOUNTER — Ambulatory Visit (INDEPENDENT_AMBULATORY_CARE_PROVIDER_SITE_OTHER): Payer: Medicare Other | Admitting: *Deleted

## 2012-08-11 DIAGNOSIS — Z95810 Presence of automatic (implantable) cardiac defibrillator: Secondary | ICD-10-CM

## 2012-08-11 DIAGNOSIS — I5022 Chronic systolic (congestive) heart failure: Secondary | ICD-10-CM | POA: Diagnosis not present

## 2012-08-15 LAB — REMOTE ICD DEVICE
AL AMPLITUDE: 3.1 mv
AL IMPEDENCE ICD: 380 Ohm
AL THRESHOLD: 0.5 V
BATTERY VOLTAGE: 3.1526 V
PACEART VT: 0
RV LEAD AMPLITUDE: 14.1 mv
RV LEAD IMPEDENCE ICD: 494 Ohm
TOT-0001: 1
TOT-0002: 0
TOT-0006: 20121016000000
TZAT-0001ATACH: 1
TZAT-0001ATACH: 2
TZAT-0001FASTVT: 1
TZAT-0001SLOWVT: 1
TZAT-0002ATACH: NEGATIVE
TZAT-0002ATACH: NEGATIVE
TZAT-0002FASTVT: NEGATIVE
TZAT-0005SLOWVT: 88 pct
TZAT-0005SLOWVT: 91 pct
TZAT-0011SLOWVT: 10 ms
TZAT-0011SLOWVT: 10 ms
TZAT-0012FASTVT: 170 ms
TZAT-0018ATACH: NEGATIVE
TZAT-0018ATACH: NEGATIVE
TZAT-0018FASTVT: NEGATIVE
TZAT-0018SLOWVT: NEGATIVE
TZAT-0018SLOWVT: NEGATIVE
TZAT-0019ATACH: 6 V
TZAT-0019ATACH: 6 V
TZAT-0020ATACH: 1.5 ms
TZON-0003ATACH: 350 ms
TZON-0003SLOWVT: 350 ms
TZST-0001ATACH: 5
TZST-0001FASTVT: 4
TZST-0001FASTVT: 6
TZST-0001SLOWVT: 4
TZST-0001SLOWVT: 6
TZST-0002ATACH: NEGATIVE
TZST-0002FASTVT: NEGATIVE
TZST-0002FASTVT: NEGATIVE
TZST-0002FASTVT: NEGATIVE
TZST-0003SLOWVT: 35 J
TZST-0003SLOWVT: 35 J

## 2012-08-20 ENCOUNTER — Encounter: Payer: Self-pay | Admitting: *Deleted

## 2012-09-02 DIAGNOSIS — M546 Pain in thoracic spine: Secondary | ICD-10-CM | POA: Diagnosis not present

## 2012-09-11 DIAGNOSIS — Z85828 Personal history of other malignant neoplasm of skin: Secondary | ICD-10-CM | POA: Diagnosis not present

## 2012-09-11 DIAGNOSIS — L570 Actinic keratosis: Secondary | ICD-10-CM | POA: Diagnosis not present

## 2012-09-11 DIAGNOSIS — D1801 Hemangioma of skin and subcutaneous tissue: Secondary | ICD-10-CM | POA: Diagnosis not present

## 2012-10-27 DIAGNOSIS — M810 Age-related osteoporosis without current pathological fracture: Secondary | ICD-10-CM | POA: Diagnosis not present

## 2012-10-27 DIAGNOSIS — M353 Polymyalgia rheumatica: Secondary | ICD-10-CM | POA: Diagnosis not present

## 2012-10-27 DIAGNOSIS — Z23 Encounter for immunization: Secondary | ICD-10-CM | POA: Diagnosis not present

## 2012-11-03 DIAGNOSIS — E782 Mixed hyperlipidemia: Secondary | ICD-10-CM | POA: Diagnosis not present

## 2012-11-03 DIAGNOSIS — J309 Allergic rhinitis, unspecified: Secondary | ICD-10-CM | POA: Diagnosis not present

## 2012-11-03 DIAGNOSIS — N183 Chronic kidney disease, stage 3 unspecified: Secondary | ICD-10-CM | POA: Diagnosis not present

## 2012-11-03 DIAGNOSIS — E039 Hypothyroidism, unspecified: Secondary | ICD-10-CM | POA: Diagnosis not present

## 2012-11-03 DIAGNOSIS — I10 Essential (primary) hypertension: Secondary | ICD-10-CM | POA: Diagnosis not present

## 2012-11-17 ENCOUNTER — Encounter: Payer: Self-pay | Admitting: Internal Medicine

## 2012-11-17 ENCOUNTER — Ambulatory Visit (INDEPENDENT_AMBULATORY_CARE_PROVIDER_SITE_OTHER): Payer: Medicare Other | Admitting: *Deleted

## 2012-11-17 DIAGNOSIS — Z95810 Presence of automatic (implantable) cardiac defibrillator: Secondary | ICD-10-CM

## 2012-11-17 DIAGNOSIS — I5022 Chronic systolic (congestive) heart failure: Secondary | ICD-10-CM | POA: Diagnosis not present

## 2012-11-24 LAB — REMOTE ICD DEVICE
AL AMPLITUDE: 3.3 mv
AL THRESHOLD: 0.5 V
ATRIAL PACING ICD: 4.52 pct
BATTERY VOLTAGE: 3.1321 V
FVT: 0
LV LEAD THRESHOLD: 1.125 V
RV LEAD AMPLITUDE: 16.3 mv
RV LEAD IMPEDENCE ICD: 570 Ohm
RV LEAD THRESHOLD: 1.5 V
TOT-0006: 20121016000000
TZAT-0001ATACH: 2
TZAT-0001SLOWVT: 1
TZAT-0001SLOWVT: 2
TZAT-0002ATACH: NEGATIVE
TZAT-0002ATACH: NEGATIVE
TZAT-0002FASTVT: NEGATIVE
TZAT-0005SLOWVT: 88 pct
TZAT-0005SLOWVT: 91 pct
TZAT-0011SLOWVT: 10 ms
TZAT-0011SLOWVT: 10 ms
TZAT-0012ATACH: 150 ms
TZAT-0012FASTVT: 170 ms
TZAT-0018ATACH: NEGATIVE
TZAT-0018ATACH: NEGATIVE
TZAT-0018FASTVT: NEGATIVE
TZAT-0018SLOWVT: NEGATIVE
TZAT-0018SLOWVT: NEGATIVE
TZAT-0019ATACH: 6 V
TZAT-0019ATACH: 6 V
TZAT-0019ATACH: 6 V
TZAT-0019FASTVT: 8 V
TZAT-0019SLOWVT: 8 V
TZAT-0019SLOWVT: 8 V
TZAT-0020ATACH: 1.5 ms
TZAT-0020FASTVT: 1.5 ms
TZON-0003VSLOWVT: 450 ms
TZON-0004VSLOWVT: 20
TZON-0005SLOWVT: 12
TZST-0001ATACH: 5
TZST-0001FASTVT: 5
TZST-0001FASTVT: 6
TZST-0001SLOWVT: 3
TZST-0002ATACH: NEGATIVE
TZST-0002FASTVT: NEGATIVE
TZST-0002FASTVT: NEGATIVE
TZST-0002FASTVT: NEGATIVE
TZST-0003SLOWVT: 35 J
TZST-0003SLOWVT: 35 J
VF: 0

## 2012-11-28 ENCOUNTER — Encounter: Payer: Self-pay | Admitting: *Deleted

## 2012-12-01 DIAGNOSIS — M546 Pain in thoracic spine: Secondary | ICD-10-CM | POA: Diagnosis not present

## 2012-12-11 ENCOUNTER — Other Ambulatory Visit: Payer: Self-pay | Admitting: Obstetrics and Gynecology

## 2012-12-11 DIAGNOSIS — Z1231 Encounter for screening mammogram for malignant neoplasm of breast: Secondary | ICD-10-CM

## 2013-01-02 ENCOUNTER — Ambulatory Visit: Payer: Medicare Other

## 2013-01-02 ENCOUNTER — Ambulatory Visit
Admission: RE | Admit: 2013-01-02 | Discharge: 2013-01-02 | Disposition: A | Discharge status: Home / Self Care | Payer: Medicare Other | Source: Ambulatory Visit | Attending: Obstetrics and Gynecology | Admitting: Obstetrics and Gynecology

## 2013-01-02 DIAGNOSIS — Z1231 Encounter for screening mammogram for malignant neoplasm of breast: Secondary | ICD-10-CM | POA: Diagnosis not present

## 2013-01-21 ENCOUNTER — Emergency Department (HOSPITAL_BASED_OUTPATIENT_CLINIC_OR_DEPARTMENT_OTHER)
Admission: EM | Admit: 2013-01-21 | Discharge: 2013-01-21 | Disposition: A | Discharge status: Home / Self Care | Payer: Medicare Other | Attending: Emergency Medicine | Admitting: Emergency Medicine

## 2013-01-21 ENCOUNTER — Encounter (HOSPITAL_BASED_OUTPATIENT_CLINIC_OR_DEPARTMENT_OTHER): Payer: Self-pay | Admitting: *Deleted

## 2013-01-21 DIAGNOSIS — Z85828 Personal history of other malignant neoplasm of skin: Secondary | ICD-10-CM | POA: Diagnosis not present

## 2013-01-21 DIAGNOSIS — I059 Rheumatic mitral valve disease, unspecified: Secondary | ICD-10-CM | POA: Diagnosis not present

## 2013-01-21 DIAGNOSIS — E039 Hypothyroidism, unspecified: Secondary | ICD-10-CM | POA: Insufficient documentation

## 2013-01-21 DIAGNOSIS — Z9861 Coronary angioplasty status: Secondary | ICD-10-CM | POA: Diagnosis not present

## 2013-01-21 DIAGNOSIS — Z8739 Personal history of other diseases of the musculoskeletal system and connective tissue: Secondary | ICD-10-CM | POA: Diagnosis not present

## 2013-01-21 DIAGNOSIS — Z7982 Long term (current) use of aspirin: Secondary | ICD-10-CM | POA: Diagnosis not present

## 2013-01-21 DIAGNOSIS — Z950 Presence of cardiac pacemaker: Secondary | ICD-10-CM | POA: Insufficient documentation

## 2013-01-21 DIAGNOSIS — K219 Gastro-esophageal reflux disease without esophagitis: Secondary | ICD-10-CM | POA: Insufficient documentation

## 2013-01-21 DIAGNOSIS — E785 Hyperlipidemia, unspecified: Secondary | ICD-10-CM | POA: Insufficient documentation

## 2013-01-21 DIAGNOSIS — G8929 Other chronic pain: Secondary | ICD-10-CM | POA: Diagnosis not present

## 2013-01-21 DIAGNOSIS — E7800 Pure hypercholesterolemia, unspecified: Secondary | ICD-10-CM | POA: Diagnosis not present

## 2013-01-21 DIAGNOSIS — F411 Generalized anxiety disorder: Secondary | ICD-10-CM | POA: Insufficient documentation

## 2013-01-21 DIAGNOSIS — Z862 Personal history of diseases of the blood and blood-forming organs and certain disorders involving the immune mechanism: Secondary | ICD-10-CM | POA: Diagnosis not present

## 2013-01-21 DIAGNOSIS — Y9210 Unspecified residential institution as the place of occurrence of the external cause: Secondary | ICD-10-CM | POA: Insufficient documentation

## 2013-01-21 DIAGNOSIS — S81009A Unspecified open wound, unspecified knee, initial encounter: Secondary | ICD-10-CM | POA: Diagnosis not present

## 2013-01-21 DIAGNOSIS — Z79899 Other long term (current) drug therapy: Secondary | ICD-10-CM | POA: Diagnosis not present

## 2013-01-21 DIAGNOSIS — I5020 Unspecified systolic (congestive) heart failure: Secondary | ICD-10-CM | POA: Insufficient documentation

## 2013-01-21 DIAGNOSIS — Z8719 Personal history of other diseases of the digestive system: Secondary | ICD-10-CM | POA: Insufficient documentation

## 2013-01-21 DIAGNOSIS — Y9389 Activity, other specified: Secondary | ICD-10-CM | POA: Insufficient documentation

## 2013-01-21 DIAGNOSIS — I10 Essential (primary) hypertension: Secondary | ICD-10-CM | POA: Diagnosis not present

## 2013-01-21 DIAGNOSIS — W268XXA Contact with other sharp object(s), not elsewhere classified, initial encounter: Secondary | ICD-10-CM | POA: Insufficient documentation

## 2013-01-21 MED ORDER — DOXYCYCLINE HYCLATE 100 MG PO CAPS: 100.0000 mg | ORAL_CAPSULE | Freq: Two times a day (BID) | ORAL | Status: DC

## 2013-01-21 MED ORDER — LIDOCAINE HCL (PF) 1 % IJ SOLN: 10.0000 mL | Freq: Once | INTRAMUSCULAR | Status: DC

## 2013-01-21 NOTE — ED Notes (Signed)
MD at bedside. 

## 2013-01-21 NOTE — ED Provider Notes (Signed)
History     CSN: [id number]  Arrival date & time 01/21/13  1302   First MD Initiated Contact with Patient 01/21/13 1310      Chief Complaint  Patient presents with  . Abrasion    (Consider location/radiation/quality/duration/timing/severity/associated sxs/prior treatment) HPI Comments: Patient comes to the ER for evaluation of a laceration on the back of her right leg. Patient was at her cardiologist's office having a checkup when the injury occurred. Patient reports that she tried to get down off of the examination table, she got the back of her leg on the table in troponin her skin. She reports that she has very thin skin and the dangers easily. Patient reports pain in the area which is mild to moderate. She has a pressure dressing in place and the bleeding has mostly stopped. Patient reports tetanus immunization 3 years ago.   Past Medical History  Diagnosis Date  . GERD (gastroesophageal reflux disease)   . Chronic low back pain   . Polymyalgia     rheumatica  . Insomnia   . Anxiety   . Hyperlipidemia   . HTN (hypertension)   . Hypothyroidism   . Chronic anemia   . Systolic heart failure   . Pacemaker   . Hiatal hernia   . Basal cell carcinoma   . Hypercholesteremia     Past Surgical History  Procedure Laterality Date  . Tonsillectomy    . Abdominal hysterectomy    . Foot surgery    . Thyroidectomy    . Mitral valve repair    . Cardiac catheterization  10/16/06    Family History  Problem Relation Age of Onset  . Colon cancer Brother   . Colon cancer Sister   . Breast cancer Sister   . Stroke Mother     in her 13's  . Cancer Father 35    secondary    History  Substance Use Topics  . Smoking status: Never Smoker   . Smokeless tobacco: Not on file  . Alcohol Use: No    OB History   Grav Para Term Preterm Abortions TAB SAB Ect Mult Living                  Review of Systems  Skin: Positive for wound.    Allergies  Amoxicillin and Sulfa  antibiotics  Home Medications   Current Outpatient Rx  Name  Route  Sig  Dispense  Refill  . aspirin 81 MG tablet   Oral   Take 81 mg by mouth daily.           Marland Kitchen atorvastatin (LIPITOR) 20 MG tablet   Oral   Take 20 mg by mouth daily.         . Calcium Carbonate (CALTRATE 600 PO)   Oral   Take by mouth 2 (two) times daily.           . Cholecalciferol (VITAMIN D3) 1000 UNITS CAPS   Oral   Take by mouth daily.           . furosemide (LASIX) 40 MG tablet   Oral   Take 40 mg by mouth. 1 qam and 1/2 po qhs         . HYDROcodone-acetaminophen (VICODIN) 5-500 MG per tablet   Oral   Take 1 tablet by mouth every 6 (six) hours as needed.           Marland Kitchen levothyroxine (SYNTHROID, LEVOTHROID) 75 MCG tablet   Oral  Take 75 mcg by mouth daily.           Marland Kitchen lisinopril (PRINIVIL,ZESTRIL) 5 MG tablet   Oral   Take 5 mg by mouth daily.           Marland Kitchen LORazepam (ATIVAN) 0.5 MG tablet   Oral   Take 0.5 mg by mouth 2 (two) times daily.           . methylPREDNISolone (MEDROL) 2 MG tablet   Oral   Take 2 mg by mouth daily.           . metoprolol (TOPROL-XL) 100 MG 24 hr tablet   Oral   Take 100 mg by mouth daily.           . Multiple Vitamin (MULTIVITAMIN PO)   Oral   Take by mouth daily.           Marland Kitchen omeprazole (PRILOSEC OTC) 20 MG tablet   Oral   Take 20 mg by mouth daily.           . potassium chloride (KLOR-CON) 20 MEQ packet      1 in am and 1/2 in pm            There were no vitals taken for this visit.  Physical Exam  Musculoskeletal:       Legs: Skin:       ED Course  Procedures (including critical care time)  LACERATION REPAIR Performed by: Orpah Greek. Authorized by: Orpah Greek Consent: Verbal consent obtained. Risks and benefits: risks, benefits and alternatives were discussed Consent given by: patient Patient identity confirmed: provided demographic data Prepped and Draped in normal sterile  fashion Wound explored  Laceration Location: right lower leg  Laceration Length: 8cm  No Foreign Bodies seen or palpated  Anesthesia: local infiltration  Local anesthetic: lidocaine 1% w/o epinephrine  Anesthetic total: 15 ml  Irrigation method: syringe Amount of cleaning: standard  Skin closure: Sutures   Number of sutures: 4 horizontal mattress sutures with 3 simple interrupted sutures between each, all 3-0 Prolene   Patient tolerance: Patient tolerated the procedure well with no immediate complications.  Labs Reviewed - No data to display No results found.   Diagnosis: Laceration    MDM  Has a large skin tear that is a flap laceration into subcutaneous fat. Because of the length and depth of the wound, I felt it was necessary to suture, although I did counsel the patient and her daughter that her skin is very thin and some of the sutures may care for her. I felt that giving him a chance to heal at the base with sutures was the best approach. Sutures were placed as outlined above. Patient empirically placed on antibiotic coverage. Suture removal in 10 days. Watch for signs of infection, return if infection occurs.        Orpah Greek, MD 01/21/13 [phone]

## 2013-01-21 NOTE — ED Notes (Signed)
Patient has a skin tear to her right calf.

## 2013-01-23 DIAGNOSIS — S81009A Unspecified open wound, unspecified knee, initial encounter: Secondary | ICD-10-CM | POA: Diagnosis not present

## 2013-02-02 DIAGNOSIS — E782 Mixed hyperlipidemia: Secondary | ICD-10-CM | POA: Diagnosis not present

## 2013-02-02 DIAGNOSIS — R059 Cough, unspecified: Secondary | ICD-10-CM | POA: Diagnosis not present

## 2013-02-02 DIAGNOSIS — S81009A Unspecified open wound, unspecified knee, initial encounter: Secondary | ICD-10-CM | POA: Diagnosis not present

## 2013-02-02 DIAGNOSIS — Z4802 Encounter for removal of sutures: Secondary | ICD-10-CM | POA: Diagnosis not present

## 2013-02-03 ENCOUNTER — Encounter: Payer: Medicare Other | Admitting: Internal Medicine

## 2013-02-26 ENCOUNTER — Encounter: Payer: Self-pay | Admitting: Cardiology

## 2013-02-26 ENCOUNTER — Ambulatory Visit (INDEPENDENT_AMBULATORY_CARE_PROVIDER_SITE_OTHER): Payer: Medicare Other | Admitting: Cardiology

## 2013-02-26 VITALS — BP 128/62 | HR 73 | Ht <= 58 in | Wt 125.0 lb

## 2013-02-26 DIAGNOSIS — Z95810 Presence of automatic (implantable) cardiac defibrillator: Secondary | ICD-10-CM

## 2013-02-26 DIAGNOSIS — I428 Other cardiomyopathies: Secondary | ICD-10-CM | POA: Diagnosis not present

## 2013-02-26 DIAGNOSIS — I5022 Chronic systolic (congestive) heart failure: Secondary | ICD-10-CM | POA: Diagnosis not present

## 2013-02-26 LAB — ICD DEVICE OBSERVATION
AL IMPEDENCE ICD: 380 Ohm
AL THRESHOLD: 0.5 V
BAMS-0001: 170 {beats}/min
BATTERY VOLTAGE: 3.12 V
LV LEAD THRESHOLD: 1 V
PACEART VT: 0
RV LEAD AMPLITUDE: 15.9 mv
TZAT-0001ATACH: 2
TZAT-0002ATACH: NEGATIVE
TZAT-0004SLOWVT: 8
TZAT-0004SLOWVT: 8
TZAT-0011SLOWVT: 10 ms
TZAT-0011SLOWVT: 10 ms
TZAT-0012ATACH: 150 ms
TZAT-0012ATACH: 150 ms
TZAT-0012FASTVT: 170 ms
TZAT-0012SLOWVT: 170 ms
TZAT-0012SLOWVT: 170 ms
TZAT-0018ATACH: NEGATIVE
TZAT-0018FASTVT: NEGATIVE
TZAT-0020ATACH: 1.5 ms
TZAT-0020ATACH: 1.5 ms
TZAT-0020FASTVT: 1.5 ms
TZAT-0020SLOWVT: 1.5 ms
TZAT-0020SLOWVT: 1.5 ms
TZON-0003ATACH: 350 ms
TZON-0003SLOWVT: 350 ms
TZON-0003VSLOWVT: 450 ms
TZON-0004VSLOWVT: 20
TZST-0001ATACH: 4
TZST-0001FASTVT: 2
TZST-0001FASTVT: 6
TZST-0001SLOWVT: 5
TZST-0002FASTVT: NEGATIVE
TZST-0003SLOWVT: 35 J
TZST-0003SLOWVT: 35 J
TZST-0003SLOWVT: 35 J

## 2013-02-26 NOTE — Progress Notes (Signed)
ELECTROPHYSIOLOGY OFFICE NOTE  Patient ID: [name] MRN: [id number], DOB/AGE: April 26, 1932   Date of Visit: 02/26/2013  Primary Physician: Lawerance Cruel, MD Primary Cardiologist: Irish Lack, MD Primary EP: [name], MD Reason for Visit: EP/device follow-up  History of Present Illness  [name] is a pleasant 79 year old woman with a NICM, chronic systolic HF s/p BiV ICD implant for CRT and primary prevention SCD who presents today for routine electrophysiology followup. Since last being seen in our clinic, she reports she is doing well. She has no cardiac complaints. Today, she specifically denies chest pain or shortness of breath. She denies palpitations, dizziness, near syncope or syncope. She denies LE swelling, orthopnea, PND or recent weight gain. [name] reports she is compliant and tolerating medications without difficulty.  Past Medical History Past Medical History  Diagnosis Date  . GERD (gastroesophageal reflux disease)   . Chronic low back pain   . Polymyalgia     rheumatica  . Insomnia   . Anxiety   . Hyperlipidemia   . HTN (hypertension)   . Hypothyroidism   . Chronic anemia   . Systolic heart failure   . Pacemaker   . Hiatal hernia   . Basal cell carcinoma   . Hypercholesteremia     Past Surgical History Past Surgical History  Procedure Laterality Date  . Tonsillectomy    . Abdominal hysterectomy    . Foot surgery    . Thyroidectomy    . Mitral valve repair    . Cardiac catheterization  10/16/06     Allergies/Intolerances Allergies  Allergen Reactions  . Amoxicillin   . Sulfa Antibiotics     Current Home Medications Current Outpatient Prescriptions  Medication Sig Dispense Refill  . aspirin 81 MG tablet Take 81 mg by mouth daily.        Marland Kitchen atorvastatin (LIPITOR) 20 MG tablet Take 20 mg by mouth daily.      . Calcium Carbonate (CALTRATE 600 PO) Take by mouth 2 (two) times daily.        . Cholecalciferol (VITAMIN D3) 1000 UNITS CAPS Take  by mouth daily.        Marland Kitchen denosumab (PROLIA) 60 MG/ML SOLN injection Inject 60 mg into the skin every 6 (six) months. Administer in upper arm, thigh, or abdomen      . furosemide (LASIX) 40 MG tablet Take 40 mg by mouth. 1 qam and 1/2 po qhs      . HYDROcodone-acetaminophen (VICODIN) 5-500 MG per tablet Take 1 tablet by mouth every 6 (six) hours as needed.        Marland Kitchen levothyroxine (SYNTHROID, LEVOTHROID) 75 MCG tablet Take 75 mcg by mouth daily.        Marland Kitchen lisinopril (PRINIVIL,ZESTRIL) 5 MG tablet Take 5 mg by mouth daily.        . methylPREDNISolone (MEDROL) 2 MG tablet Take 2 mg by mouth daily.        . metoprolol (TOPROL-XL) 100 MG 24 hr tablet Take 100 mg by mouth daily.        . Multiple Vitamin (MULTIVITAMIN PO) Take by mouth daily.        Marland Kitchen omeprazole (PRILOSEC OTC) 20 MG tablet Take 20 mg by mouth daily.        . potassium chloride (KLOR-CON) 20 MEQ packet 1 in am and 1/2 in pm        No current facility-administered medications for this visit.    Social History History  Social History  . Marital Status: Widowed   Social History Main Topics  . Smoking status: Never Smoker   . Smokeless tobacco: No  . Alcohol Use: No  . Drug Use: No   Review of Systems General: No chills, fever, night sweats or weight changes Cardiovascular: No chest pain, dyspnea on exertion, edema, orthopnea, palpitations, paroxysmal nocturnal dyspnea Dermatological: No rash, lesions or masses Respiratory: No cough, dyspnea Urologic: No hematuria, dysuria Abdominal: No nausea, vomiting, diarrhea, bright red blood per rectum, melena, or hematemesis Neurologic: No visual changes, weakness, changes in mental status All other systems reviewed and are otherwise negative except as noted above.  Physical Exam Blood pressure 128/62, pulse 73, height 4\' 8"  (1.422 m), weight 125 lb (56.7 kg), SpO2 98.00%.  General: Well developed, well appearing 79 year old female in no acute distress. HEENT: Normocephalic,  atraumatic. EOMs intact. Sclera nonicteric. Oropharynx clear.  Neck: Supple. No JVD. Lungs: Respirations regular and unlabored, CTA bilaterally. No wheezes, rales or rhonchi. Heart: RRR. S1, S2 present. No murmurs, rub, S3 or S4. Abdomen: Soft, non-distended.  Extremities: No clubbing, cyanosis or edema. DP/PT/Radials 2+ and equal bilaterally. Psych: Normal affect. Neuro: Alert and oriented X 3. Moves all extremities spontaneously.   Diagnostics Device interrogation today - Thresholds and sensing consistent with previous device measurements. Lead impedance trends stable over time. No mode switch episodes recorded. No ventricular arrhythmia episodes recorded. Patient bi-ventricularly pacing >99% of the time. OptiVol reviewed and stable. Device programmed with appropriate safety margins. Heart failure diagnostics reviewed and trends are stable for patient.   Assessment and Plan 1. NICM with chronic systolic HF s/p BiV ICD implant for CRT and primary prevention SCD Normal device function; BiV pacing >99% of the time Continue routine remote device follow-up every 3 months Return to clinic for follow-up with Dr. [name] in one year Euvolemic by exam; OptiVol reviewed and stable Continue medical therapy  Signed, Ileene Hutchinson, PA-C 02/26/2013, 2:25 PM

## 2013-02-26 NOTE — Patient Instructions (Addendum)
Remote monitoring is used to monitor your Pacemaker of ICD from home. This monitoring reduces the number of office visits required to check your device to one time per year. It allows [place] to keep an eye on the functioning of your device to ensure it is working properly. You are scheduled for a device check from home on 06/01/13. You may send your transmission at any time that day. If you have a wireless device, the transmission will be sent automatically. After your physician reviews your transmission, you will receive a postcard with your next transmission date.  Your physician wants you to follow-up in: 1 year with Dr Knox Saliva will receive a reminder letter in the mail two months in advance. If you don't receive a letter, please call our office to schedule the follow-up appointment.

## 2013-03-25 ENCOUNTER — Encounter: Payer: Self-pay | Admitting: Internal Medicine

## 2013-04-08 DIAGNOSIS — T148XXA Other injury of unspecified body region, initial encounter: Secondary | ICD-10-CM | POA: Diagnosis not present

## 2013-04-08 DIAGNOSIS — M25559 Pain in unspecified hip: Secondary | ICD-10-CM | POA: Diagnosis not present

## 2013-04-08 DIAGNOSIS — M25579 Pain in unspecified ankle and joints of unspecified foot: Secondary | ICD-10-CM | POA: Diagnosis not present

## 2013-04-08 DIAGNOSIS — L089 Local infection of the skin and subcutaneous tissue, unspecified: Secondary | ICD-10-CM | POA: Diagnosis not present

## 2013-04-09 ENCOUNTER — Other Ambulatory Visit: Payer: Self-pay | Admitting: Family Medicine

## 2013-04-09 DIAGNOSIS — R7989 Other specified abnormal findings of blood chemistry: Secondary | ICD-10-CM

## 2013-04-09 DIAGNOSIS — M7989 Other specified soft tissue disorders: Secondary | ICD-10-CM | POA: Diagnosis not present

## 2013-04-09 DIAGNOSIS — M79609 Pain in unspecified limb: Secondary | ICD-10-CM | POA: Diagnosis not present

## 2013-04-10 DIAGNOSIS — M76829 Posterior tibial tendinitis, unspecified leg: Secondary | ICD-10-CM | POA: Diagnosis not present

## 2013-04-10 DIAGNOSIS — M545 Low back pain: Secondary | ICD-10-CM | POA: Diagnosis not present

## 2013-05-04 DIAGNOSIS — E039 Hypothyroidism, unspecified: Secondary | ICD-10-CM | POA: Diagnosis not present

## 2013-05-04 DIAGNOSIS — M25559 Pain in unspecified hip: Secondary | ICD-10-CM | POA: Diagnosis not present

## 2013-05-04 DIAGNOSIS — E782 Mixed hyperlipidemia: Secondary | ICD-10-CM | POA: Diagnosis not present

## 2013-05-04 DIAGNOSIS — I10 Essential (primary) hypertension: Secondary | ICD-10-CM | POA: Diagnosis not present

## 2013-05-04 DIAGNOSIS — N183 Chronic kidney disease, stage 3 unspecified: Secondary | ICD-10-CM | POA: Diagnosis not present

## 2013-05-26 DIAGNOSIS — M76899 Other specified enthesopathies of unspecified lower limb, excluding foot: Secondary | ICD-10-CM | POA: Diagnosis not present

## 2013-05-26 DIAGNOSIS — Z79899 Other long term (current) drug therapy: Secondary | ICD-10-CM | POA: Diagnosis not present

## 2013-05-26 DIAGNOSIS — M546 Pain in thoracic spine: Secondary | ICD-10-CM | POA: Diagnosis not present

## 2013-05-27 DIAGNOSIS — M25559 Pain in unspecified hip: Secondary | ICD-10-CM | POA: Diagnosis not present

## 2013-05-27 DIAGNOSIS — M810 Age-related osteoporosis without current pathological fracture: Secondary | ICD-10-CM | POA: Diagnosis not present

## 2013-05-27 DIAGNOSIS — M353 Polymyalgia rheumatica: Secondary | ICD-10-CM | POA: Diagnosis not present

## 2013-06-01 ENCOUNTER — Encounter: Payer: Medicare Other | Admitting: *Deleted

## 2013-06-12 ENCOUNTER — Encounter: Payer: Self-pay | Admitting: *Deleted

## 2013-06-22 ENCOUNTER — Telehealth: Payer: Self-pay | Admitting: Internal Medicine

## 2013-06-22 ENCOUNTER — Ambulatory Visit (INDEPENDENT_AMBULATORY_CARE_PROVIDER_SITE_OTHER): Payer: Medicare Other | Admitting: *Deleted

## 2013-06-22 ENCOUNTER — Encounter: Payer: Self-pay | Admitting: Internal Medicine

## 2013-06-22 DIAGNOSIS — Z95810 Presence of automatic (implantable) cardiac defibrillator: Secondary | ICD-10-CM

## 2013-06-22 DIAGNOSIS — I5022 Chronic systolic (congestive) heart failure: Secondary | ICD-10-CM

## 2013-06-22 NOTE — Telephone Encounter (Signed)
New Prob  Pt states that she received a letter about not submitting a transmission. She said that she does not do those, so she is confused.

## 2013-06-22 NOTE — Telephone Encounter (Signed)
Patient to send transmission that will re-establish communication with the transmitter.

## 2013-07-02 LAB — REMOTE ICD DEVICE
AL AMPLITUDE: 2.9 mv
AL IMPEDENCE ICD: 380 Ohm
AL THRESHOLD: 0.625 V
BAMS-0001: 170 {beats}/min
BATTERY VOLTAGE: 3.0844 V
FVT: 0
PACEART VT: 0
RV LEAD AMPLITUDE: 13.8 mv
RV LEAD IMPEDENCE ICD: 456 Ohm
TOT-0001: 1
TOT-0002: 0
TZAT-0001ATACH: 3
TZAT-0001FASTVT: 1
TZAT-0001SLOWVT: 1
TZAT-0001SLOWVT: 2
TZAT-0002ATACH: NEGATIVE
TZAT-0004SLOWVT: 8
TZAT-0004SLOWVT: 8
TZAT-0012ATACH: 150 ms
TZAT-0012ATACH: 150 ms
TZAT-0012FASTVT: 170 ms
TZAT-0012SLOWVT: 170 ms
TZAT-0012SLOWVT: 170 ms
TZAT-0018FASTVT: NEGATIVE
TZAT-0019ATACH: 6 V
TZAT-0019SLOWVT: 8 V
TZAT-0019SLOWVT: 8 V
TZAT-0020ATACH: 1.5 ms
TZAT-0020ATACH: 1.5 ms
TZAT-0020SLOWVT: 1.5 ms
TZAT-0020SLOWVT: 1.5 ms
TZON-0003VSLOWVT: 450 ms
TZON-0004VSLOWVT: 20
TZST-0001ATACH: 6
TZST-0001FASTVT: 2
TZST-0001FASTVT: 4
TZST-0001FASTVT: 5
TZST-0001FASTVT: 6
TZST-0001SLOWVT: 3
TZST-0001SLOWVT: 5
TZST-0002ATACH: NEGATIVE
TZST-0002ATACH: NEGATIVE
TZST-0002FASTVT: NEGATIVE
TZST-0002FASTVT: NEGATIVE
TZST-0002FASTVT: NEGATIVE
TZST-0003SLOWVT: 35 J
TZST-0003SLOWVT: 35 J
TZST-0003SLOWVT: 35 J

## 2013-07-17 ENCOUNTER — Encounter: Payer: Self-pay | Admitting: *Deleted

## 2013-08-27 DIAGNOSIS — Z79899 Other long term (current) drug therapy: Secondary | ICD-10-CM | POA: Diagnosis not present

## 2013-08-27 DIAGNOSIS — M546 Pain in thoracic spine: Secondary | ICD-10-CM | POA: Diagnosis not present

## 2013-08-27 DIAGNOSIS — M76899 Other specified enthesopathies of unspecified lower limb, excluding foot: Secondary | ICD-10-CM | POA: Diagnosis not present

## 2013-09-02 DIAGNOSIS — Z23 Encounter for immunization: Secondary | ICD-10-CM | POA: Diagnosis not present

## 2013-09-28 ENCOUNTER — Ambulatory Visit (INDEPENDENT_AMBULATORY_CARE_PROVIDER_SITE_OTHER): Payer: Medicare Other | Admitting: *Deleted

## 2013-09-28 ENCOUNTER — Encounter: Payer: Self-pay | Admitting: Internal Medicine

## 2013-09-28 DIAGNOSIS — I5022 Chronic systolic (congestive) heart failure: Secondary | ICD-10-CM

## 2013-09-28 DIAGNOSIS — Z95810 Presence of automatic (implantable) cardiac defibrillator: Secondary | ICD-10-CM

## 2013-09-29 LAB — REMOTE ICD DEVICE
AL IMPEDENCE ICD: 342 Ohm
BAMS-0001: 170 {beats}/min
CHARGE TIME: 9.339 s
LV LEAD IMPEDENCE ICD: 836 Ohm
PACEART VT: 0
RV LEAD AMPLITUDE: 16 mv
RV LEAD IMPEDENCE ICD: 513 Ohm
TOT-0001: 1
TOT-0002: 0
TZAT-0001ATACH: 3
TZAT-0001FASTVT: 1
TZAT-0002ATACH: NEGATIVE
TZAT-0012ATACH: 150 ms
TZAT-0012ATACH: 150 ms
TZAT-0012FASTVT: 170 ms
TZAT-0012SLOWVT: 170 ms
TZAT-0012SLOWVT: 170 ms
TZAT-0013SLOWVT: 2
TZAT-0013SLOWVT: 2
TZAT-0018ATACH: NEGATIVE
TZAT-0019ATACH: 6 V
TZAT-0019SLOWVT: 8 V
TZAT-0019SLOWVT: 8 V
TZAT-0020ATACH: 1.5 ms
TZAT-0020ATACH: 1.5 ms
TZAT-0020ATACH: 1.5 ms
TZAT-0020SLOWVT: 1.5 ms
TZAT-0020SLOWVT: 1.5 ms
TZON-0003ATACH: 350 ms
TZON-0003SLOWVT: 350 ms
TZON-0003VSLOWVT: 450 ms
TZON-0004SLOWVT: 32
TZON-0005SLOWVT: 12
TZST-0001ATACH: 4
TZST-0001ATACH: 5
TZST-0001ATACH: 6
TZST-0001FASTVT: 2
TZST-0001FASTVT: 4
TZST-0001SLOWVT: 4
TZST-0001SLOWVT: 5
TZST-0002ATACH: NEGATIVE
TZST-0002ATACH: NEGATIVE
TZST-0002FASTVT: NEGATIVE
TZST-0002FASTVT: NEGATIVE
TZST-0003SLOWVT: 35 J
TZST-0003SLOWVT: 35 J

## 2013-10-02 ENCOUNTER — Encounter: Payer: Self-pay | Admitting: *Deleted

## 2013-10-08 ENCOUNTER — Encounter: Payer: Self-pay | Admitting: Interventional Cardiology

## 2013-10-27 ENCOUNTER — Telehealth: Payer: Self-pay | Admitting: Internal Medicine

## 2013-10-27 NOTE — Telephone Encounter (Signed)
New problem    Pt called to speak to Mr Zimmer about the her letter mailed to her please Device results.

## 2013-11-04 DIAGNOSIS — M810 Age-related osteoporosis without current pathological fracture: Secondary | ICD-10-CM | POA: Diagnosis not present

## 2013-11-04 DIAGNOSIS — R7301 Impaired fasting glucose: Secondary | ICD-10-CM | POA: Diagnosis not present

## 2013-11-04 DIAGNOSIS — I10 Essential (primary) hypertension: Secondary | ICD-10-CM | POA: Diagnosis not present

## 2013-11-04 DIAGNOSIS — N183 Chronic kidney disease, stage 3 unspecified: Secondary | ICD-10-CM | POA: Diagnosis not present

## 2013-11-29 DIAGNOSIS — R42 Dizziness and giddiness: Secondary | ICD-10-CM | POA: Diagnosis not present

## 2013-11-30 DIAGNOSIS — Z79899 Other long term (current) drug therapy: Secondary | ICD-10-CM | POA: Diagnosis not present

## 2013-12-10 DIAGNOSIS — M353 Polymyalgia rheumatica: Secondary | ICD-10-CM | POA: Diagnosis not present

## 2013-12-10 DIAGNOSIS — M25559 Pain in unspecified hip: Secondary | ICD-10-CM | POA: Diagnosis not present

## 2013-12-10 DIAGNOSIS — M810 Age-related osteoporosis without current pathological fracture: Secondary | ICD-10-CM | POA: Diagnosis not present

## 2013-12-30 ENCOUNTER — Ambulatory Visit (INDEPENDENT_AMBULATORY_CARE_PROVIDER_SITE_OTHER): Payer: Medicare Other | Admitting: *Deleted

## 2013-12-30 DIAGNOSIS — I5022 Chronic systolic (congestive) heart failure: Secondary | ICD-10-CM

## 2013-12-30 DIAGNOSIS — I428 Other cardiomyopathies: Secondary | ICD-10-CM | POA: Diagnosis not present

## 2013-12-30 LAB — MDC_IDC_ENUM_SESS_TYPE_REMOTE
Brady Statistic AP VP Percent: 3.3 %
Brady Statistic AS VP Percent: 96.2 %
Lead Channel Impedance Value: 380 Ohm
Lead Channel Impedance Value: 456 Ohm
Lead Channel Impedance Value: 836 Ohm
Lead Channel Pacing Threshold Amplitude: 1.125 V
Lead Channel Pacing Threshold Pulse Width: 0.4 ms
Lead Channel Sensing Intrinsic Amplitude: 16.8 mV
Lead Channel Sensing Intrinsic Amplitude: 3.1 mV
Lead Channel Setting Pacing Amplitude: 2 V
Lead Channel Setting Pacing Amplitude: 2 V
Lead Channel Setting Pacing Pulse Width: 1 ms
Lead Channel Setting Pacing Pulse Width: 1 ms
Lead Channel Setting Sensing Sensitivity: 0.3 mV
MDC IDC MSMT LEADCHNL LV PACING THRESHOLD PULSEWIDTH: 0.4 ms
MDC IDC MSMT LEADCHNL RA PACING THRESHOLD AMPLITUDE: 0.625 V
MDC IDC MSMT LEADCHNL RA PACING THRESHOLD PULSEWIDTH: 0.4 ms
MDC IDC MSMT LEADCHNL RV PACING THRESHOLD AMPLITUDE: 2.125 V
MDC IDC SET LEADCHNL RV PACING AMPLITUDE: 3 V
MDC IDC SET ZONE DETECTION INTERVAL: 300 ms
MDC IDC SET ZONE DETECTION INTERVAL: 350 ms
MDC IDC SET ZONE DETECTION INTERVAL: 350 ms
MDC IDC STAT BRADY AP VS PERCENT: 0.1 %
MDC IDC STAT BRADY AS VS PERCENT: 0.5 %
Zone Setting Detection Interval: 450 ms

## 2014-01-14 ENCOUNTER — Telehealth: Payer: Self-pay | Admitting: Internal Medicine

## 2014-01-14 NOTE — Telephone Encounter (Signed)
Follow up          Pt would like a call back concerning her Remote pacer Results letter. Pt is not happy with the follow up and her results going to a complete strange.    Please give her a call back.  Pt may ask to see the Office Manager at her visit.

## 2014-01-19 ENCOUNTER — Encounter: Payer: Self-pay | Admitting: *Deleted

## 2014-01-21 ENCOUNTER — Ambulatory Visit: Payer: Medicare Other | Admitting: Interventional Cardiology

## 2014-01-25 ENCOUNTER — Encounter: Payer: Self-pay | Admitting: General Surgery

## 2014-01-25 DIAGNOSIS — I059 Rheumatic mitral valve disease, unspecified: Secondary | ICD-10-CM

## 2014-01-25 DIAGNOSIS — I429 Cardiomyopathy, unspecified: Secondary | ICD-10-CM | POA: Insufficient documentation

## 2014-01-25 DIAGNOSIS — E782 Mixed hyperlipidemia: Secondary | ICD-10-CM

## 2014-01-27 ENCOUNTER — Encounter: Payer: Self-pay | Admitting: Internal Medicine

## 2014-01-27 ENCOUNTER — Ambulatory Visit: Payer: Medicare Other | Admitting: Interventional Cardiology

## 2014-02-01 ENCOUNTER — Other Ambulatory Visit: Payer: Self-pay | Admitting: Interventional Cardiology

## 2014-03-02 ENCOUNTER — Other Ambulatory Visit: Payer: Self-pay | Admitting: Interventional Cardiology

## 2014-03-03 ENCOUNTER — Encounter: Payer: Medicare Other | Admitting: Internal Medicine

## 2014-03-16 ENCOUNTER — Other Ambulatory Visit: Payer: Self-pay

## 2014-03-16 DIAGNOSIS — Z1231 Encounter for screening mammogram for malignant neoplasm of breast: Secondary | ICD-10-CM

## 2014-03-24 ENCOUNTER — Encounter: Payer: Self-pay | Admitting: Interventional Cardiology

## 2014-03-24 ENCOUNTER — Ambulatory Visit (INDEPENDENT_AMBULATORY_CARE_PROVIDER_SITE_OTHER): Payer: Medicare Other | Admitting: Interventional Cardiology

## 2014-03-24 VITALS — BP 140/84 | HR 85 | Ht <= 58 in | Wt 127.1 lb

## 2014-03-24 DIAGNOSIS — I059 Rheumatic mitral valve disease, unspecified: Secondary | ICD-10-CM

## 2014-03-24 DIAGNOSIS — I428 Other cardiomyopathies: Secondary | ICD-10-CM | POA: Diagnosis not present

## 2014-03-24 DIAGNOSIS — I10 Essential (primary) hypertension: Secondary | ICD-10-CM | POA: Diagnosis not present

## 2014-03-24 DIAGNOSIS — E782 Mixed hyperlipidemia: Secondary | ICD-10-CM

## 2014-03-24 DIAGNOSIS — I429 Cardiomyopathy, unspecified: Secondary | ICD-10-CM

## 2014-03-24 NOTE — Progress Notes (Signed)
Patient ID: [name], female   DOB: [birth year], 80 y.o.   MRN: [id number]    [facility], [place], [place]  [address] Phone: [phone] Fax:  [phone]  Date:  03/24/2014   ID:  [name], DOB 06-12-1932, MRN [id number]  PCP:   Melinda Crutch, MD      History of Present Illness: [name] is a 80 y.o. female who has had mitral valve repair. She also had nonischemic cardiomyopathy which resolved with medical therapy. She is limited mostly by her back. Unfortunately, while getting down from the exam table today after ECG, she had a skin tear on her calf. Pressure helsd and the bleeding stopped. Denies : Chest pain.  Shortness of breath at patient's baseline, .  Dizziness.  Leg edema.  Orthopnea.  Paroxysmal nocturnal dyspnea.  Palpitations.  Syncope.  Walking limited by joint pains. Prior AICD was adjusted and she slepts better. Now she feels a pounding in her chest. fatigue with exercise. She needs skin surgery for basal cell CA on her nose.    Wt Readings from Last 3 Encounters:  03/24/14 127 lb 1.9 oz (57.661 kg)  02/26/13 125 lb (56.7 kg)  01/28/12 131 lb 12.8 oz (59.784 kg)     Past Medical History  Diagnosis Date  . GERD (gastroesophageal reflux disease)   . Chronic low back pain   . Polymyalgia     rheumatica-steroids per Rheum  . Insomnia   . Anxiety   . Hyperlipidemia   . HTN (hypertension)   . Chronic anemia   . Systolic heart failure   . Pacemaker   . Hiatal hernia   . Hypercholesteremia   . Colon polyps   . Aneurysm   . Allergic rhinitis   . Left heart failure   . Anemia     iron deficient  . Hypothyroidism   . Osteoporosis     S/P forteo treatment as of 02/2009  . Basal cell carcinoma     Current Outpatient Prescriptions  Medication Sig Dispense Refill  . aspirin 81 MG tablet Take 81 mg by mouth daily.        Marland Kitchen atorvastatin (LIPITOR) 20 MG tablet Take 40 mg by mouth daily.       . Calcium Carbonate (CALTRATE 600 PO)  Take by mouth 2 (two) times daily.        . Cholecalciferol (VITAMIN D3) 1000 UNITS CAPS Take by mouth daily.        Marland Kitchen denosumab (PROLIA) 60 MG/ML SOLN injection Inject 60 mg into the skin every 6 (six) months. Administer in upper arm, thigh, or abdomen      . fluticasone (FLOVENT DISKUS) 50 MCG/BLIST diskus inhaler Inhale 1 puff into the lungs 2 (two) times daily.      . furosemide (LASIX) 40 MG tablet Take one tablet by mouth daily in the morning and half daily in the p.m.  45 tablet  1  . HYDROcodone-acetaminophen (VICODIN) 5-500 MG per tablet Take 1 tablet by mouth every 6 (six) hours as needed.        Marland Kitchen levothyroxine (SYNTHROID, LEVOTHROID) 75 MCG tablet Take 75 mcg by mouth daily.        Marland Kitchen lisinopril (PRINIVIL,ZESTRIL) 5 MG tablet Take one tablet by mouth one time daily  30 tablet  1  . Melatonin 3 MG CAPS Take 1 capsule by mouth as needed.      . methylPREDNISolone (MEDROL) 2 MG tablet Take 2  mg by mouth daily.        . metoprolol succinate (TOPROL-XL) 100 MG 24 hr tablet Take one tablet by mouth one time daily  30 tablet  1  . Multiple Vitamin (MULTIVITAMIN PO) Take by mouth daily.        Marland Kitchen omeprazole (PRILOSEC OTC) 20 MG tablet Take 20 mg by mouth 2 (two) times daily.       . potassium chloride (KLOR-CON) 20 MEQ packet 1 in am and 1/2 in pm       . potassium chloride SA (K-DUR,KLOR-CON) 20 MEQ tablet Take one tablet in the am and 1/2 tablet in the pm  45 tablet  1   No current facility-administered medications for this visit.    Allergies:    Allergies  Allergen Reactions  . Sulfa Antibiotics Nausea Only  . Amoxicillin Rash    Social History:  The patient  reports that she has never smoked. She does not have any smokeless tobacco history on file. She reports that she does not drink alcohol.   Family History:  The patient's family history includes Breast cancer in her sister; Cancer (age of onset: 81) in her father; Colon cancer in her brother and sister; Stroke in her mother.    ROS:  Please see the history of present illness.  No nausea, vomiting.  No fevers, chills.  No focal weakness.  No dysuria.    All other systems reviewed and negative.   PHYSICAL EXAM: VS:  BP 140/84  Pulse 85  Ht 4\' 8"  (1.422 m)  Wt 127 lb 1.9 oz (57.661 kg)  BMI 28.52 kg/m2 Well nourished, well developed, in no acute distress HEENT: normal Neck: no JVD, no carotid bruits Cardiac:  normal S1, S2; RRR;  Lungs:  clear to auscultation bilaterally, no wheezing, rhonchi or rales Abd: soft, nontender, no hepatomegaly Ext: no edema Skin: warm and dry Neuro:   no focal abnormalities noted  EKG:  NSR, a sensed, v paced    ASSESSMENT AND PLAN:  Cardiomyopathy  Continue Lisinopril Tablet, 5MG  Tablet, 1 tablet, Once a day Continue Metoprolol Succinate Tablet Extended Release 24 Hour, 100MG  Tablet, TAKE ONE TABLET BY MOUTH ONE TIME DAILY Notes: Echo in 2011 showed improved LV function.  2. Essential hypertension, benign  Notes: Controlled.  3. Mitral valve disorders  Notes: Needs SBE prophylaxis. s/p repair several years ago.  4. Combined hyperlipidemia  Continue Atorvastatin Calcium Tablet, 40 MG, 1 tablet, Orally, Once a day, 30 day(s), 30, Refills 11 Notes: TG elevated. Would switch to atorvastatin. Decreasing sugar intake. She has tried fish oil. Her sister has used krill oil with success. She tried this but even frozen capsules gave reflux. 5. Others  Notes: She had a problem with how the pacer results were given to her.  Will have her speak with Barnett Applebaum.    Signed, Mina Marble, MD, [facility] 03/24/2014 12:23 PM

## 2014-03-24 NOTE — Patient Instructions (Signed)
Your physician recommends that you continue on your current medications as directed. Please refer to the Current Medication list given to you today.  Your physician wants you to follow-up in: 1 year with Dr. Varanasi. You will receive a reminder letter in the mail two months in advance. If you don't receive a letter, please call our office to schedule the follow-up appointment.  

## 2014-04-06 ENCOUNTER — Other Ambulatory Visit: Payer: Self-pay | Admitting: Interventional Cardiology

## 2014-04-06 ENCOUNTER — Ambulatory Visit
Admission: RE | Admit: 2014-04-06 | Discharge: 2014-04-06 | Disposition: A | Discharge status: Home / Self Care | Payer: Medicare Other | Source: Ambulatory Visit

## 2014-04-06 DIAGNOSIS — Z1231 Encounter for screening mammogram for malignant neoplasm of breast: Secondary | ICD-10-CM

## 2014-04-07 ENCOUNTER — Encounter: Payer: Self-pay | Admitting: Internal Medicine

## 2014-04-07 ENCOUNTER — Ambulatory Visit (INDEPENDENT_AMBULATORY_CARE_PROVIDER_SITE_OTHER): Payer: Medicare Other | Admitting: Internal Medicine

## 2014-04-07 VITALS — BP 140/50 | HR 80 | Ht <= 58 in | Wt 127.0 lb

## 2014-04-07 DIAGNOSIS — I429 Cardiomyopathy, unspecified: Secondary | ICD-10-CM

## 2014-04-07 DIAGNOSIS — I428 Other cardiomyopathies: Secondary | ICD-10-CM | POA: Diagnosis not present

## 2014-04-07 DIAGNOSIS — Z95810 Presence of automatic (implantable) cardiac defibrillator: Secondary | ICD-10-CM

## 2014-04-07 DIAGNOSIS — I5022 Chronic systolic (congestive) heart failure: Secondary | ICD-10-CM | POA: Diagnosis not present

## 2014-04-07 LAB — MDC_IDC_ENUM_SESS_TYPE_INCLINIC
Battery Voltage: 3.01 V
Brady Statistic AP VS Percent: 0.04 %
Brady Statistic AS VS Percent: 0.41 %
Brady Statistic RA Percent Paced: 4.08 %
Date Time Interrogation Session: 20150513140242
HIGH POWER IMPEDANCE MEASURED VALUE: 209 Ohm
HIGH POWER IMPEDANCE MEASURED VALUE: 42 Ohm
HIGH POWER IMPEDANCE MEASURED VALUE: 58 Ohm
HighPow Impedance: 323 Ohm
Lead Channel Impedance Value: 513 Ohm
Lead Channel Impedance Value: 760 Ohm
Lead Channel Pacing Threshold Amplitude: 0.625 V
Lead Channel Pacing Threshold Amplitude: 1.125 V
Lead Channel Pacing Threshold Pulse Width: 0.4 ms
Lead Channel Pacing Threshold Pulse Width: 0.4 ms
Lead Channel Pacing Threshold Pulse Width: 1 ms
Lead Channel Sensing Intrinsic Amplitude: 15.75 mV
Lead Channel Sensing Intrinsic Amplitude: 16.125 mV
Lead Channel Setting Pacing Amplitude: 2 V
Lead Channel Setting Pacing Amplitude: 2 V
Lead Channel Setting Pacing Pulse Width: 1 ms
Lead Channel Setting Pacing Pulse Width: 1 ms
Lead Channel Setting Sensing Sensitivity: 0.3 mV
MDC IDC MSMT LEADCHNL LV IMPEDANCE VALUE: 399 Ohm
MDC IDC MSMT LEADCHNL RA IMPEDANCE VALUE: 342 Ohm
MDC IDC MSMT LEADCHNL RA SENSING INTR AMPL: 2.875 mV
MDC IDC MSMT LEADCHNL RA SENSING INTR AMPL: 3 mV
MDC IDC MSMT LEADCHNL RV IMPEDANCE VALUE: 437 Ohm
MDC IDC MSMT LEADCHNL RV PACING THRESHOLD AMPLITUDE: 2.25 V
MDC IDC SET LEADCHNL RV PACING AMPLITUDE: 3.5 V
MDC IDC SET ZONE DETECTION INTERVAL: 300 ms
MDC IDC SET ZONE DETECTION INTERVAL: 350 ms
MDC IDC STAT BRADY AP VP PERCENT: 4.04 %
MDC IDC STAT BRADY AS VP PERCENT: 95.5 %
MDC IDC STAT BRADY RV PERCENT PACED: 99.55 %
Zone Setting Detection Interval: 350 ms
Zone Setting Detection Interval: 450 ms

## 2014-04-07 NOTE — Assessment & Plan Note (Signed)
Her medtronic BiV ICD is working normally. Will recheck in several months.

## 2014-04-07 NOTE — Progress Notes (Signed)
HPI [name] returns today for ICD followup. She is a pleasant 80 yo woman with a longstanding h/o chronic systolic heart failure, LBBB, HTN and severe arthritis, s/p BiV ICD implant. In the interim, she has been very stable from a CHF perspective. Her biggest problem has been related to arthritis and bone loss with multiple vertebral compression fractures. She has been on chronic steroid therapy for PMR.  Allergies  Allergen Reactions  . Sulfa Antibiotics Nausea Only  . Amoxicillin Rash     Current Outpatient Prescriptions  Medication Sig Dispense Refill  . aspirin 81 MG tablet Take 81 mg by mouth daily.        Marland Kitchen atorvastatin (LIPITOR) 20 MG tablet Take 40 mg by mouth daily.       . Calcium Carbonate (CALTRATE 600 PO) Take by mouth 2 (two) times daily.        . Cholecalciferol (VITAMIN D3) 1000 UNITS CAPS Take by mouth daily.        Marland Kitchen denosumab (PROLIA) 60 MG/ML SOLN injection Inject 60 mg into the skin every 6 (six) months. Administer in upper arm, thigh, or abdomen      . furosemide (LASIX) 40 MG tablet Take one tablet by mouth daily in the morning and half daily in the p.m.  45 tablet  1  . HYDROcodone-acetaminophen (VICODIN) 5-500 MG per tablet Take 1 tablet by mouth every 6 (six) hours as needed.        Marland Kitchen levothyroxine (SYNTHROID, LEVOTHROID) 75 MCG tablet Take 75 mcg by mouth daily.        Marland Kitchen lisinopril (PRINIVIL,ZESTRIL) 5 MG tablet TAKE ONE TABLET BY MOUTH ONE TIME DAILY   30 tablet  0  . methylPREDNISolone (MEDROL) 2 MG tablet Take 2 mg by mouth daily.        . metoprolol succinate (TOPROL-XL) 100 MG 24 hr tablet TAKE ONE TABLET BY MOUTH ONE TIME DAILY   30 tablet  0  . Multiple Vitamin (MULTIVITAMIN PO) Take by mouth daily.        Marland Kitchen omeprazole (PRILOSEC OTC) 20 MG tablet Take 20 mg by mouth 2 (two) times daily.       . potassium chloride SA (K-DUR,KLOR-CON) 20 MEQ tablet take 1 tablet in the morning and 1/2 tablet in the evening  45 tablet  0   No current  facility-administered medications for this visit.     Past Medical History  Diagnosis Date  . GERD (gastroesophageal reflux disease)   . Chronic low back pain   . Polymyalgia     rheumatica-steroids per Rheum  . Insomnia   . Anxiety   . Hyperlipidemia   . HTN (hypertension)   . Chronic anemia   . Systolic heart failure   . Pacemaker   . Hiatal hernia   . Hypercholesteremia   . Colon polyps   . Aneurysm   . Allergic rhinitis   . Left heart failure   . Anemia     iron deficient  . Hypothyroidism   . Osteoporosis     S/P forteo treatment as of 02/2009  . Basal cell carcinoma     ROS:   All systems reviewed and negative except as noted in the HPI.   Past Surgical History  Procedure Laterality Date  . Tonsillectomy    . Abdominal hysterectomy    . Foot surgery    . Thyroidectomy    . Mitral valve repair    . Cardiac catheterization  10/16/06  . Cataract extraction    . Angioplasty    . Skin cancer removal    . Back surgery    . Defibulator implant       Family History  Problem Relation Age of Onset  . Colon cancer Brother   . Colon cancer Sister   . Breast cancer Sister   . Stroke Mother     in her 41's  . Cancer Father 67    secondary     History   Social History  . Marital Status: Widowed    Spouse Name: N/A    Number of Children: N/A  . Years of Education: N/A   Occupational History  . Not on file.   Social History Main Topics  . Smoking status: Never Smoker   . Smokeless tobacco: Not on file  . Alcohol Use: No  . Drug Use: Not on file  . Sexual Activity: Not on file   Other Topics Concern  . Not on file   Social History Narrative  . No narrative on file     BP 140/50  Pulse 80  Ht 4\' 8"  (1.422 m)  Wt 127 lb (57.607 kg)  BMI 28.49 kg/m2  Physical Exam:  stable appearing elderly woman, NAD HEENT: Unremarkable Neck:  6 cm JVD, no thyromegally Back:  No CVA tenderness Lungs:  Clear with no wheezes HEART:  Regular rate  rhythm, no murmurs, no rubs, no clicks Abd:  soft, positive bowel sounds, no organomegally, no rebound, no guarding Ext:  2 plus pulses, no edema, no cyanosis, no clubbing Skin:  No rashes no nodules Neuro:  CN II through XII intact, motor grossly intact   DEVICE  Normal device function.  See PaceArt for details.   Assess/Plan:

## 2014-04-07 NOTE — Patient Instructions (Signed)
Your physician wants you to follow-up in: 12 months with Dr Knox Saliva will receive a reminder letter in the mail two months in advance. If you don't receive a letter, please call our office to schedule the follow-up appointment.  Remote monitoring is used to monitor your Pacemaker of ICD from home. This monitoring reduces the number of office visits required to check your device to one time per year. It allows [place] to keep an eye on the functioning of your device to ensure it is working properly. You are scheduled for a device check from home on 07/07/14. You may send your transmission at any time that day. If you have a wireless device, the transmission will be sent automatically. After your physician reviews your transmission, you will receive a postcard with your next transmission date.

## 2014-04-07 NOTE — Assessment & Plan Note (Signed)
Her heart failure symptoms are well compensated. She will continue her current meds and I have asked her to reduce her sodium intake.

## 2014-04-09 ENCOUNTER — Other Ambulatory Visit: Payer: Self-pay

## 2014-04-09 MED ORDER — LISINOPRIL 5 MG PO TABS: ORAL_TABLET | ORAL | Status: DC

## 2014-04-09 MED ORDER — POTASSIUM CHLORIDE CRYS ER 20 MEQ PO TBCR: EXTENDED_RELEASE_TABLET | ORAL | Status: DC

## 2014-04-09 MED ORDER — METOPROLOL SUCCINATE ER 100 MG PO TB24: ORAL_TABLET | ORAL | Status: DC

## 2014-05-02 ENCOUNTER — Other Ambulatory Visit: Payer: Self-pay | Admitting: Interventional Cardiology

## 2014-05-06 DIAGNOSIS — J3489 Other specified disorders of nose and nasal sinuses: Secondary | ICD-10-CM | POA: Diagnosis not present

## 2014-05-06 DIAGNOSIS — E782 Mixed hyperlipidemia: Secondary | ICD-10-CM | POA: Diagnosis not present

## 2014-05-06 DIAGNOSIS — I10 Essential (primary) hypertension: Secondary | ICD-10-CM | POA: Diagnosis not present

## 2014-05-06 DIAGNOSIS — M810 Age-related osteoporosis without current pathological fracture: Secondary | ICD-10-CM | POA: Diagnosis not present

## 2014-05-06 DIAGNOSIS — Z23 Encounter for immunization: Secondary | ICD-10-CM | POA: Diagnosis not present

## 2014-05-06 DIAGNOSIS — E039 Hypothyroidism, unspecified: Secondary | ICD-10-CM | POA: Diagnosis not present

## 2014-05-06 DIAGNOSIS — D509 Iron deficiency anemia, unspecified: Secondary | ICD-10-CM | POA: Diagnosis not present

## 2014-06-02 ENCOUNTER — Other Ambulatory Visit: Payer: Self-pay

## 2014-06-02 MED ORDER — METOPROLOL SUCCINATE ER 100 MG PO TB24: ORAL_TABLET | ORAL | Status: DC

## 2014-06-02 MED ORDER — POTASSIUM CHLORIDE CRYS ER 20 MEQ PO TBCR: EXTENDED_RELEASE_TABLET | ORAL | Status: DC

## 2014-06-02 MED ORDER — FUROSEMIDE 40 MG PO TABS: ORAL_TABLET | ORAL | Status: DC

## 2014-06-02 MED ORDER — LISINOPRIL 5 MG PO TABS: ORAL_TABLET | ORAL | Status: DC

## 2014-06-09 ENCOUNTER — Other Ambulatory Visit: Payer: Self-pay | Admitting: *Deleted

## 2014-06-09 DIAGNOSIS — M353 Polymyalgia rheumatica: Secondary | ICD-10-CM | POA: Diagnosis not present

## 2014-06-09 DIAGNOSIS — M79609 Pain in unspecified limb: Secondary | ICD-10-CM | POA: Diagnosis not present

## 2014-06-09 DIAGNOSIS — M25559 Pain in unspecified hip: Secondary | ICD-10-CM | POA: Diagnosis not present

## 2014-06-09 DIAGNOSIS — M810 Age-related osteoporosis without current pathological fracture: Secondary | ICD-10-CM | POA: Diagnosis not present

## 2014-06-09 MED ORDER — METOPROLOL SUCCINATE ER 100 MG PO TB24: ORAL_TABLET | ORAL | Status: DC

## 2014-06-09 MED ORDER — LISINOPRIL 5 MG PO TABS: ORAL_TABLET | ORAL | Status: DC

## 2014-06-09 MED ORDER — FUROSEMIDE 40 MG PO TABS: ORAL_TABLET | ORAL | Status: DC

## 2014-06-09 MED ORDER — POTASSIUM CHLORIDE CRYS ER 20 MEQ PO TBCR: EXTENDED_RELEASE_TABLET | ORAL | Status: DC

## 2014-06-11 DIAGNOSIS — R609 Edema, unspecified: Secondary | ICD-10-CM | POA: Diagnosis not present

## 2014-06-11 DIAGNOSIS — N289 Disorder of kidney and ureter, unspecified: Secondary | ICD-10-CM | POA: Diagnosis not present

## 2014-07-07 ENCOUNTER — Ambulatory Visit (INDEPENDENT_AMBULATORY_CARE_PROVIDER_SITE_OTHER): Payer: Medicare Other | Admitting: *Deleted

## 2014-07-07 ENCOUNTER — Telehealth: Payer: Self-pay | Admitting: Cardiology

## 2014-07-07 DIAGNOSIS — I428 Other cardiomyopathies: Secondary | ICD-10-CM | POA: Diagnosis not present

## 2014-07-07 DIAGNOSIS — I429 Cardiomyopathy, unspecified: Secondary | ICD-10-CM

## 2014-07-07 DIAGNOSIS — I5022 Chronic systolic (congestive) heart failure: Secondary | ICD-10-CM | POA: Diagnosis not present

## 2014-07-07 LAB — MDC_IDC_ENUM_SESS_TYPE_REMOTE
Battery Voltage: 2.98 V
Brady Statistic AS VP Percent: 93.35 %
Brady Statistic RA Percent Paced: 5.34 %
HIGH POWER IMPEDANCE MEASURED VALUE: 46 Ohm
HighPow Impedance: 209 Ohm
HighPow Impedance: 380 Ohm
HighPow Impedance: 60 Ohm
Lead Channel Impedance Value: 494 Ohm
Lead Channel Impedance Value: 532 Ohm
Lead Channel Pacing Threshold Amplitude: 1 V
Lead Channel Pacing Threshold Amplitude: 2.5 V
Lead Channel Pacing Threshold Pulse Width: 0.4 ms
Lead Channel Pacing Threshold Pulse Width: 1 ms
Lead Channel Sensing Intrinsic Amplitude: 14.875 mV
Lead Channel Sensing Intrinsic Amplitude: 14.875 mV
Lead Channel Setting Pacing Amplitude: 2 V
Lead Channel Setting Pacing Amplitude: 2 V
Lead Channel Setting Pacing Amplitude: 3.5 V
Lead Channel Setting Pacing Pulse Width: 1 ms
Lead Channel Setting Pacing Pulse Width: 1 ms
MDC IDC MSMT LEADCHNL LV IMPEDANCE VALUE: 437 Ohm
MDC IDC MSMT LEADCHNL LV IMPEDANCE VALUE: 779 Ohm
MDC IDC MSMT LEADCHNL RA IMPEDANCE VALUE: 380 Ohm
MDC IDC MSMT LEADCHNL RA PACING THRESHOLD AMPLITUDE: 0.5 V
MDC IDC MSMT LEADCHNL RA PACING THRESHOLD PULSEWIDTH: 0.4 ms
MDC IDC MSMT LEADCHNL RA SENSING INTR AMPL: 3 mV
MDC IDC MSMT LEADCHNL RA SENSING INTR AMPL: 3 mV
MDC IDC SESS DTM: 20150812172314
MDC IDC SET LEADCHNL RV SENSING SENSITIVITY: 0.3 mV
MDC IDC SET ZONE DETECTION INTERVAL: 300 ms
MDC IDC SET ZONE DETECTION INTERVAL: 350 ms
MDC IDC SET ZONE DETECTION INTERVAL: 450 ms
MDC IDC STAT BRADY AP VP PERCENT: 5.27 %
MDC IDC STAT BRADY AP VS PERCENT: 0.07 %
MDC IDC STAT BRADY AS VS PERCENT: 1.31 %
MDC IDC STAT BRADY RV PERCENT PACED: 98.62 %
Zone Setting Detection Interval: 350 ms

## 2014-07-07 NOTE — Progress Notes (Signed)
Remote ICD transmission.   

## 2014-07-07 NOTE — Telephone Encounter (Signed)
Spoke with pt and reminded pt of remote transmission that is due today. Pt verbalized understanding.   

## 2014-07-27 ENCOUNTER — Encounter: Payer: Self-pay | Admitting: Cardiology

## 2014-08-02 ENCOUNTER — Encounter: Payer: Self-pay | Admitting: Internal Medicine

## 2014-08-18 DIAGNOSIS — Z23 Encounter for immunization: Secondary | ICD-10-CM | POA: Diagnosis not present

## 2014-10-06 ENCOUNTER — Telehealth: Payer: Self-pay | Admitting: Cardiology

## 2014-10-06 NOTE — Telephone Encounter (Signed)
Pt called and wanted to know if she needed to send a manual transmission on Monday 10-11-14. I informed pt that she would need to send a manual transmission. She verbalized understanding. She is still very upset from the HIPPA violation that occurred in 09-2013 where her results was sent to the wrong address. She stated that she still not has heard from the [employer] system about this issue and she wants to know what the status is on this issue. I informed pt that I do not know what the dept protocol is or what their investigation turn over is. I informed her to call our office manager and get an update on this information. She verbalized understanding.

## 2014-10-11 ENCOUNTER — Encounter: Payer: Self-pay | Admitting: Internal Medicine

## 2014-10-11 ENCOUNTER — Ambulatory Visit (INDEPENDENT_AMBULATORY_CARE_PROVIDER_SITE_OTHER): Payer: Medicare Other | Admitting: *Deleted

## 2014-10-11 DIAGNOSIS — I5022 Chronic systolic (congestive) heart failure: Secondary | ICD-10-CM | POA: Diagnosis not present

## 2014-10-11 DIAGNOSIS — I429 Cardiomyopathy, unspecified: Secondary | ICD-10-CM

## 2014-10-11 NOTE — Progress Notes (Signed)
Remote ICD transmission.   

## 2014-10-12 LAB — MDC_IDC_ENUM_SESS_TYPE_REMOTE
Battery Voltage: 2.95 V
Brady Statistic AP VP Percent: 4.76 %
Brady Statistic AP VS Percent: 0.05 %
Brady Statistic AS VP Percent: 94.52 %
Brady Statistic AS VS Percent: 0.66 %
Brady Statistic RA Percent Paced: 4.81 %
Brady Statistic RV Percent Paced: 99.28 %
Date Time Interrogation Session: 20151116072607
HighPow Impedance: 380 Ohm
HighPow Impedance: 46 Ohm
HighPow Impedance: 63 Ohm
Lead Channel Impedance Value: 380 Ohm
Lead Channel Impedance Value: 437 Ohm
Lead Channel Impedance Value: 494 Ohm
Lead Channel Impedance Value: 646 Ohm
Lead Channel Impedance Value: 912 Ohm
Lead Channel Pacing Threshold Amplitude: 0.5 V
Lead Channel Pacing Threshold Amplitude: 0.875 V
Lead Channel Pacing Threshold Amplitude: 2.5 V
Lead Channel Pacing Threshold Pulse Width: 0.4 ms
Lead Channel Pacing Threshold Pulse Width: 0.4 ms
Lead Channel Pacing Threshold Pulse Width: 1 ms
Lead Channel Sensing Intrinsic Amplitude: 16.875 mV
Lead Channel Sensing Intrinsic Amplitude: 16.875 mV
Lead Channel Sensing Intrinsic Amplitude: 2.875 mV
Lead Channel Sensing Intrinsic Amplitude: 2.875 mV
Lead Channel Setting Pacing Amplitude: 2 V
Lead Channel Setting Pacing Amplitude: 2 V
Lead Channel Setting Pacing Amplitude: 3.5 V
Lead Channel Setting Pacing Pulse Width: 1 ms
Lead Channel Setting Pacing Pulse Width: 1 ms
Lead Channel Setting Sensing Sensitivity: 0.3 mV
Zone Setting Detection Interval: 300 ms
Zone Setting Detection Interval: 350 ms
Zone Setting Detection Interval: 350 ms
Zone Setting Detection Interval: 450 ms

## 2014-10-20 ENCOUNTER — Encounter: Payer: Self-pay | Admitting: Cardiology

## 2014-10-27 ENCOUNTER — Other Ambulatory Visit: Payer: Self-pay | Admitting: Interventional Cardiology

## 2014-11-03 ENCOUNTER — Other Ambulatory Visit: Payer: Self-pay

## 2014-11-03 MED ORDER — METOPROLOL SUCCINATE ER 100 MG PO TB24: 100.0000 mg | ORAL_TABLET | Freq: Every day | ORAL | Status: DC

## 2014-11-03 MED ORDER — LEVOTHYROXINE SODIUM 75 MCG PO TABS: 75.0000 ug | ORAL_TABLET | Freq: Every day | ORAL | Status: AC

## 2014-11-03 MED ORDER — FUROSEMIDE 40 MG PO TABS: ORAL_TABLET | ORAL | Status: DC

## 2014-11-11 DIAGNOSIS — D509 Iron deficiency anemia, unspecified: Secondary | ICD-10-CM | POA: Diagnosis not present

## 2014-11-11 DIAGNOSIS — N183 Chronic kidney disease, stage 3 (moderate): Secondary | ICD-10-CM | POA: Diagnosis not present

## 2014-11-11 DIAGNOSIS — Z0000 Encounter for general adult medical examination without abnormal findings: Secondary | ICD-10-CM | POA: Diagnosis not present

## 2014-11-11 DIAGNOSIS — E039 Hypothyroidism, unspecified: Secondary | ICD-10-CM | POA: Diagnosis not present

## 2014-11-11 DIAGNOSIS — E782 Mixed hyperlipidemia: Secondary | ICD-10-CM | POA: Diagnosis not present

## 2014-11-11 DIAGNOSIS — K219 Gastro-esophageal reflux disease without esophagitis: Secondary | ICD-10-CM | POA: Diagnosis not present

## 2014-11-11 DIAGNOSIS — R5381 Other malaise: Secondary | ICD-10-CM | POA: Diagnosis not present

## 2014-11-11 DIAGNOSIS — I10 Essential (primary) hypertension: Secondary | ICD-10-CM | POA: Diagnosis not present

## 2014-11-11 DIAGNOSIS — M810 Age-related osteoporosis without current pathological fracture: Secondary | ICD-10-CM | POA: Diagnosis not present

## 2015-01-04 DIAGNOSIS — M353 Polymyalgia rheumatica: Secondary | ICD-10-CM | POA: Diagnosis not present

## 2015-01-04 DIAGNOSIS — M810 Age-related osteoporosis without current pathological fracture: Secondary | ICD-10-CM | POA: Diagnosis not present

## 2015-01-04 DIAGNOSIS — M2550 Pain in unspecified joint: Secondary | ICD-10-CM | POA: Diagnosis not present

## 2015-01-04 DIAGNOSIS — Z789 Other specified health status: Secondary | ICD-10-CM | POA: Diagnosis not present

## 2015-01-11 ENCOUNTER — Ambulatory Visit (INDEPENDENT_AMBULATORY_CARE_PROVIDER_SITE_OTHER): Payer: Medicare Other | Admitting: *Deleted

## 2015-01-11 DIAGNOSIS — I429 Cardiomyopathy, unspecified: Secondary | ICD-10-CM | POA: Diagnosis not present

## 2015-01-11 DIAGNOSIS — I5022 Chronic systolic (congestive) heart failure: Secondary | ICD-10-CM | POA: Diagnosis not present

## 2015-01-11 LAB — MDC_IDC_ENUM_SESS_TYPE_REMOTE
Battery Voltage: 2.89 V
Brady Statistic AP VP Percent: 2.5 %
Brady Statistic AP VS Percent: 0.1 %
Brady Statistic AS VP Percent: 97.1 %
HIGH POWER IMPEDANCE MEASURED VALUE: 42 Ohm
Lead Channel Impedance Value: 380 Ohm
Lead Channel Impedance Value: 494 Ohm
Lead Channel Impedance Value: 817 Ohm
Lead Channel Pacing Threshold Amplitude: 0.5 V
Lead Channel Pacing Threshold Pulse Width: 0.4 ms
Lead Channel Pacing Threshold Pulse Width: 0.4 ms
Lead Channel Pacing Threshold Pulse Width: 1 ms
Lead Channel Sensing Intrinsic Amplitude: 2.8 mV
Lead Channel Setting Pacing Amplitude: 2 V
Lead Channel Setting Pacing Amplitude: 2 V
Lead Channel Setting Pacing Amplitude: 3.5 V
Lead Channel Setting Pacing Pulse Width: 1 ms
Lead Channel Setting Sensing Sensitivity: 0.3 mV
MDC IDC MSMT LEADCHNL LV PACING THRESHOLD AMPLITUDE: 0.875 V
MDC IDC MSMT LEADCHNL RV PACING THRESHOLD AMPLITUDE: 2.375 V
MDC IDC MSMT LEADCHNL RV SENSING INTR AMPL: 16.9 mV
MDC IDC SET LEADCHNL RV PACING PULSEWIDTH: 1 ms
MDC IDC SET ZONE DETECTION INTERVAL: 350 ms
MDC IDC SET ZONE DETECTION INTERVAL: 450 ms
MDC IDC STAT BRADY AS VS PERCENT: 0.4 %
Zone Setting Detection Interval: 300 ms
Zone Setting Detection Interval: 350 ms

## 2015-01-11 NOTE — Progress Notes (Signed)
Remote ICD transmission.   

## 2015-01-13 ENCOUNTER — Other Ambulatory Visit: Payer: Self-pay | Admitting: Interventional Cardiology

## 2015-01-17 DIAGNOSIS — M810 Age-related osteoporosis without current pathological fracture: Secondary | ICD-10-CM | POA: Diagnosis not present

## 2015-01-21 ENCOUNTER — Encounter: Payer: Self-pay | Admitting: *Deleted

## 2015-02-01 ENCOUNTER — Encounter: Payer: Self-pay | Admitting: Internal Medicine

## 2015-04-05 ENCOUNTER — Other Ambulatory Visit: Payer: Self-pay | Admitting: Interventional Cardiology

## 2015-04-06 NOTE — Telephone Encounter (Signed)
Per note 5.13.15

## 2015-04-07 ENCOUNTER — Encounter: Payer: Self-pay | Admitting: Internal Medicine

## 2015-04-07 ENCOUNTER — Ambulatory Visit (INDEPENDENT_AMBULATORY_CARE_PROVIDER_SITE_OTHER): Payer: Medicare Other | Admitting: Internal Medicine

## 2015-04-07 VITALS — BP 130/70 | HR 82 | Ht <= 58 in | Wt 118.6 lb

## 2015-04-07 DIAGNOSIS — E782 Mixed hyperlipidemia: Secondary | ICD-10-CM

## 2015-04-07 DIAGNOSIS — Z95810 Presence of automatic (implantable) cardiac defibrillator: Secondary | ICD-10-CM

## 2015-04-07 DIAGNOSIS — I5022 Chronic systolic (congestive) heart failure: Secondary | ICD-10-CM

## 2015-04-07 DIAGNOSIS — I429 Cardiomyopathy, unspecified: Secondary | ICD-10-CM | POA: Diagnosis not present

## 2015-04-07 LAB — CUP PACEART INCLINIC DEVICE CHECK
Date Time Interrogation Session: 20160512152456
Lead Channel Pacing Threshold Amplitude: 0.5 V
Lead Channel Pacing Threshold Amplitude: 0.75 V
Lead Channel Pacing Threshold Amplitude: 1.5 V
Lead Channel Pacing Threshold Pulse Width: 0.4 ms
Lead Channel Pacing Threshold Pulse Width: 0.4 ms
Lead Channel Pacing Threshold Pulse Width: 1 ms
Lead Channel Sensing Intrinsic Amplitude: 2.9 mV
MDC IDC MSMT LEADCHNL RV SENSING INTR AMPL: 14.6 mV

## 2015-04-07 NOTE — Progress Notes (Signed)
HPI [name] returns today for ICD followup. She is a pleasant 81 yo woman with a longstanding h/o chronic systolic heart failure, LBBB, HTN and severe arthritis, s/p BiV ICD implant. In the interim, she has been very stable from a CHF perspective. Her biggest problem has been related to arthritis and bone loss with multiple vertebral compression fractures. She has been on chronic steroid therapy for PMR.  She continues to complain of back pain which limits her.  Allergies  Allergen Reactions  . Sulfa Antibiotics Nausea Only  . Amoxicillin Rash     Current Outpatient Prescriptions  Medication Sig Dispense Refill  . aspirin 81 MG tablet Take 81 mg by mouth daily.      Marland Kitchen atorvastatin (LIPITOR) 40 MG tablet Take 40 mg by mouth daily.    . Calcium Carbonate (CALTRATE 600 PO) Take by mouth 2 (two) times daily.      . Cholecalciferol (VITAMIN D3) 1000 UNITS CAPS Take by mouth daily.      Marland Kitchen denosumab (PROLIA) 60 MG/ML SOLN injection Inject 60 mg into the skin every 6 (six) months. Administer in upper arm, thigh, or abdomen    . furosemide (LASIX) 40 MG tablet TAKE 1 TABLET BY MOUTH IN THE MORNING AND ONE HALF TABLET IN THE EVENING. 135 tablet 1  . HYDROcodone-acetaminophen (NORCO/VICODIN) 5-325 MG per tablet Take 1 tablet by mouth every 6 (six) hours as needed. for pain  0  . levothyroxine (SYNTHROID, LEVOTHROID) 75 MCG tablet Take 1 tablet (75 mcg total) by mouth daily. 90 tablet 1  . lisinopril (PRINIVIL,ZESTRIL) 5 MG tablet TAKE 1 TABLET ONE TIME DAILY 90 tablet 1  . methylPREDNISolone (MEDROL) 2 MG tablet Take 2 mg by mouth daily.      . metoprolol succinate (TOPROL-XL) 100 MG 24 hr tablet Take 1 tablet (100 mg total) by mouth daily. Take with or immediately following a meal. 90 tablet 1  . Multiple Vitamin (MULTIVITAMIN PO) Take by mouth daily.      Marland Kitchen omeprazole (PRILOSEC OTC) 20 MG tablet Take 20 mg by mouth 2 (two) times daily.     . potassium chloride SA (K-DUR,KLOR-CON) 20 MEQ  tablet TAKE 1 TABLET IN THE MORNING  AND TAKE 1/2 TABLET IN THE EVENING 135 tablet 1   No current facility-administered medications for this visit.     Past Medical History  Diagnosis Date  . GERD (gastroesophageal reflux disease)   . Chronic low back pain   . Polymyalgia     rheumatica-steroids per Rheum  . Insomnia   . Anxiety   . Hyperlipidemia   . HTN (hypertension)   . Chronic anemia   . Systolic heart failure   . Pacemaker   . Hiatal hernia   . Hypercholesteremia   . Colon polyps   . Aneurysm   . Allergic rhinitis   . Left heart failure   . Anemia     iron deficient  . Hypothyroidism   . Osteoporosis     S/P forteo treatment as of 02/2009  . Basal cell carcinoma     ROS:   All systems reviewed and negative except as noted in the HPI.   Past Surgical History  Procedure Laterality Date  . Tonsillectomy    . Abdominal hysterectomy    . Foot surgery    . Thyroidectomy    . Mitral valve repair    . Cardiac catheterization  10/16/06  . Cataract extraction    . Angioplasty    .  Skin cancer removal    . Back surgery    . Defibulator implant       Family History  Problem Relation Age of Onset  . Colon cancer Brother   . Colon cancer Sister   . Breast cancer Sister   . Stroke Mother     in her 71's  . Cancer Father 31    secondary     History   Social History  . Marital Status: Widowed    Spouse Name: N/A  . Number of Children: N/A  . Years of Education: N/A   Occupational History  . Not on file.   Social History Main Topics  . Smoking status: Never Smoker   . Smokeless tobacco: Not on file  . Alcohol Use: No  . Drug Use: Not on file  . Sexual Activity: Not on file   Other Topics Concern  . Not on file   Social History Narrative     BP 130/70 mmHg  Pulse 82  Ht 4\' 9"  (1.448 m)  Wt 118 lb 9.6 oz (53.797 kg)  BMI 25.66 kg/m2  Physical Exam:  stable appearing elderly woman, NAD HEENT: Unremarkable Neck:  6 cm JVD, no  thyromegally Back:  No CVA tenderness Lungs:  Clear with no wheezes, well healed PM incision. HEART:  Regular rate rhythm, no murmurs, no rubs, no clicks Abd:  soft, positive bowel sounds, no organomegally, no rebound, no guarding Ext:  2 plus pulses, no edema, no cyanosis, no clubbing Skin:  No rashes no nodules Neuro:  CN II through XII intact, motor grossly intact  ECG - NSR with biv pacing  DEVICE  Normal device function.  See PaceArt for details.   Assess/Plan:

## 2015-04-07 NOTE — Assessment & Plan Note (Signed)
Her Medtronic BiV ICD is working normally. Will recheck in several months.

## 2015-04-07 NOTE — Assessment & Plan Note (Signed)
She will continue her lipitor and maintain a low fat diet.

## 2015-04-07 NOTE — Patient Instructions (Addendum)
Medication Instructions:  Your physician recommends that you continue on your current medications as directed. Please refer to the Current Medication list given to you today.   Labwork: NONE  Testing/Procedures: NONE  Follow-Up: Your physician wants you to follow-up in: 6 months with Dr. [name]. You will receive a reminder letter in the mail two months in advance. If you don't receive a letter, please call our office to schedule the follow-up appointment.  Remote monitoring is used to monitor your Pacemaker or ICD from home. This monitoring reduces the number of office visits required to check your device to one time per year. It allows [place] to keep an eye on the functioning of your device to ensure it is working properly. You are scheduled for a device check from home on 07/07/2015. You may send your transmission at any time that day. If you have a wireless device, the transmission will be sent automatically. After your physician reviews your transmission, you will receive a postcard with your next transmission date.   Any Other Special Instructions Will Be Listed Below (If Applicable).  You may start to increase activity.

## 2015-04-07 NOTE — Assessment & Plan Note (Signed)
Her symptoms are class 2. She is limited by a bad back. Will follow.

## 2015-04-18 ENCOUNTER — Ambulatory Visit (INDEPENDENT_AMBULATORY_CARE_PROVIDER_SITE_OTHER): Payer: Medicare Other | Admitting: Interventional Cardiology

## 2015-04-18 ENCOUNTER — Encounter: Payer: Self-pay | Admitting: Interventional Cardiology

## 2015-04-18 VITALS — BP 162/66 | HR 85 | Ht <= 58 in | Wt 118.0 lb

## 2015-04-18 DIAGNOSIS — E782 Mixed hyperlipidemia: Secondary | ICD-10-CM

## 2015-04-18 DIAGNOSIS — I10 Essential (primary) hypertension: Secondary | ICD-10-CM | POA: Diagnosis not present

## 2015-04-18 DIAGNOSIS — I059 Rheumatic mitral valve disease, unspecified: Secondary | ICD-10-CM

## 2015-04-18 DIAGNOSIS — I429 Cardiomyopathy, unspecified: Secondary | ICD-10-CM | POA: Diagnosis not present

## 2015-04-18 DIAGNOSIS — Z95810 Presence of automatic (implantable) cardiac defibrillator: Secondary | ICD-10-CM

## 2015-04-18 NOTE — Progress Notes (Signed)
Patient ID: [name], female   DOB: [birth year], 81 y.o.   MRN: [id number]     Cardiology Office Note   Date:  04/18/2015   ID:  [name], DOB 03-17-1932, MRN [id number]  PCP:   Melinda Crutch, MD    No chief complaint on file. f/u MR   Wt Readings from Last 3 Encounters:  04/18/15 118 lb (53.524 kg)  04/07/15 118 lb 9.6 oz (53.797 kg)  04/07/14 127 lb (57.607 kg)       History of Present Illness: [name] is a 81 y.o. female  who has had mitral valve repair. She also had nonischemic cardiomyopathy which resolved with medical therapy. She is limited mostly by her back.  Denies : Chest pain.   Dizziness.  Leg edema.  Orthopnea.  Paroxysmal nocturnal dyspnea.  Palpitations.  Syncope.   Walking limited by joint pains. Prior AICD was adjusted and she slepts better.  Shortness of breath at patient's baseline, .She tries to walk in the house for regular exercise.  She is afraid of any more injections in her back.  She is getting Prolia.  Her sister recently had compression fractures as well.  BP controlled at home.    Past Medical History  Diagnosis Date  . GERD (gastroesophageal reflux disease)   . Chronic low back pain   . Polymyalgia     rheumatica-steroids per Rheum  . Insomnia   . Anxiety   . Hyperlipidemia   . HTN (hypertension)   . Chronic anemia   . Systolic heart failure   . Pacemaker   . Hiatal hernia   . Hypercholesteremia   . Colon polyps   . Aneurysm   . Allergic rhinitis   . Left heart failure   . Anemia     iron deficient  . Hypothyroidism   . Osteoporosis     S/P forteo treatment as of 02/2009  . Basal cell carcinoma     Past Surgical History  Procedure Laterality Date  . Tonsillectomy    . Abdominal hysterectomy    . Foot surgery    . Thyroidectomy    . Mitral valve repair    . Cardiac catheterization  10/16/06  . Cataract extraction    . Angioplasty    . Skin cancer removal    . Back surgery    . Defibulator implant        Current Outpatient Prescriptions  Medication Sig Dispense Refill  . aspirin 81 MG tablet Take 81 mg by mouth daily.      Marland Kitchen atorvastatin (LIPITOR) 40 MG tablet Take 40 mg by mouth daily.    . Calcium Carbonate (CALTRATE 600 PO) Take by mouth 2 (two) times daily.      . Cholecalciferol (VITAMIN D3) 1000 UNITS CAPS Take by mouth daily.      Marland Kitchen denosumab (PROLIA) 60 MG/ML SOLN injection Inject 60 mg into the skin every 6 (six) months. Administer in upper arm, thigh, or abdomen    . furosemide (LASIX) 40 MG tablet TAKE 1 TABLET BY MOUTH IN THE MORNING AND ONE HALF TABLET IN THE EVENING. 135 tablet 1  . HYDROcodone-acetaminophen (NORCO/VICODIN) 5-325 MG per tablet Take 1 tablet by mouth every 6 (six) hours as needed. for pain  0  . levothyroxine (SYNTHROID, LEVOTHROID) 75 MCG tablet Take 1 tablet (75 mcg total) by mouth daily. 90 tablet 1  . lisinopril (PRINIVIL,ZESTRIL) 5 MG tablet TAKE 1 TABLET ONE TIME DAILY 90 tablet 1  .  methylPREDNISolone (MEDROL) 2 MG tablet Take 2 mg by mouth daily.      . metoprolol succinate (TOPROL-XL) 100 MG 24 hr tablet Take 1 tablet (100 mg total) by mouth daily. Take with or immediately following a meal. 90 tablet 1  . Multiple Vitamin (MULTIVITAMIN PO) Take by mouth daily.      Marland Kitchen omeprazole (PRILOSEC OTC) 20 MG tablet Take 20 mg by mouth 2 (two) times daily.     . potassium chloride SA (K-DUR,KLOR-CON) 20 MEQ tablet TAKE 1 TABLET IN THE MORNING  AND TAKE 1/2 TABLET IN THE EVENING 135 tablet 1   No current facility-administered medications for this visit.    Allergies:   Sulfa antibiotics and Amoxicillin    Social History:  The patient  reports that she has never smoked. She does not have any smokeless tobacco history on file. She reports that she does not drink alcohol.   Family History:  The patient's *family history includes Breast cancer in her sister; Cancer (age of onset: 29) in her father; Colon cancer in her brother and sister; Stroke in her mother.     ROS:  Please see the history of present illness.   Otherwise, review of systems are positive for back pain , joint pain.  No cardiac sx.   All other systems are reviewed and negative.    PHYSICAL EXAM: VS:  BP 162/66 mmHg  Pulse 85  Ht 4\' 9"  (1.448 m)  Wt 118 lb (53.524 kg)  BMI 25.53 kg/m2 , BMI Body mass index is 25.53 kg/(m^2). GEN: Well nourished, well developed, in no acute distress HEENT: normal Neck: no JVD, carotid bruits, or masses Cardiac: *RRR; no murmurs, rubs, or gallops,no edema ; +S4 Respiratory:  clear to auscultation bilaterally, normal work of breathing GI: soft, nontender, nondistended, + BS MS: no deformity or atrophy Skin: warm and dry, no rash Neuro:  Strength and sensation are intact Psych: euthymic mood, full affect   EKG:   The ekg ordered last week demonstrates paced rhythm   Recent Labs: No results found for requested labs within last 365 days.   Lipid Panel No results found for: CHOL, TRIG, HDL, CHOLHDL, VLDL, LDLCALC, LDLDIRECT   Other studies Reviewed: Additional studies/ records that were reviewed today with results demonstrating: 2011 echo: Normal LV function. MV repair.   ASSESSMENT AND PLAN:  Cardiomyopathy  Continue Lisinopril Tablet, 5MG  Tablet, 1 tablet, Once a day Continue Metoprolol Succinate Tablet Extended Release 24 Hour, 100MG  Tablet, TAKE ONE TABLET BY MOUTH ONE TIME DAILY Notes: Echo in 2011 showed improved LV function. Continue current meds 2. Essential hypertension, benign  Notes: Controlled at home.  Elevated in PMDs ofice.  Continue current meds.  Call us if BP > 140/90. 3. Mitral valve disorders  Notes: Needs SBE prophylaxis. s/p repair several years ago.  4. Combined hyperlipidemia  Continue Atorvastatin Calcium Tablet, 40 MG, 1 tablet, Orally, Once a day, 30 day(s), 30, Refills 11 Notes: TG elevated.  Decreasing sugar intake. She has tried fish oil. Her sister has used krill oil with success. She tried this but  even frozen capsules gave reflux; intolerant.  Followed by Dr. Harrington Challenger. No CAD at the time of prior cath before valve surgery.  She sees Dr. [name] regularly for her pacemaker. She prefers to f/u prn with [place]. She will let us know if she has any further cardiac issues. I will see her when necessary.   Current medicines are reviewed at length with the patient today.  The patient concerns regarding her medicines were addressed.  The following changes have been made:  No change  Labs/ tests ordered today include:  No orders of the defined types were placed in this encounter.    Recommend 150 minutes/week of aerobic exercise Low fat, low carb, high fiber diet recommended  Disposition:   FU prn   Teresita Madura., MD  04/18/2015 11:38 AM    [facility] [address], [place], [place]  [address] Phone: [phone]; Fax: [phone]

## 2015-04-18 NOTE — Patient Instructions (Addendum)
**Note De-Identified [name] Obfuscation** Medication Instructions:  Same  Labwork: None  Testing/Procedures: None  Follow-Up: You do not require a follow up with Dr Irish Lack any longer. Please keep follow ups with Dr [name].

## 2015-06-14 ENCOUNTER — Encounter (HOSPITAL_BASED_OUTPATIENT_CLINIC_OR_DEPARTMENT_OTHER): Payer: Self-pay | Admitting: *Deleted

## 2015-06-14 ENCOUNTER — Emergency Department (HOSPITAL_BASED_OUTPATIENT_CLINIC_OR_DEPARTMENT_OTHER): Payer: Medicare Other

## 2015-06-14 ENCOUNTER — Emergency Department (HOSPITAL_BASED_OUTPATIENT_CLINIC_OR_DEPARTMENT_OTHER)
Admission: EM | Admit: 2015-06-14 | Discharge: 2015-06-14 | Disposition: A | Discharge status: Home / Self Care | Payer: Medicare Other | Attending: Emergency Medicine | Admitting: Emergency Medicine

## 2015-06-14 DIAGNOSIS — Z8659 Personal history of other mental and behavioral disorders: Secondary | ICD-10-CM | POA: Insufficient documentation

## 2015-06-14 DIAGNOSIS — W010XXA Fall on same level from slipping, tripping and stumbling without subsequent striking against object, initial encounter: Secondary | ICD-10-CM | POA: Insufficient documentation

## 2015-06-14 DIAGNOSIS — W19XXXA Unspecified fall, initial encounter: Secondary | ICD-10-CM

## 2015-06-14 DIAGNOSIS — M810 Age-related osteoporosis without current pathological fracture: Secondary | ICD-10-CM | POA: Insufficient documentation

## 2015-06-14 DIAGNOSIS — S8001XA Contusion of right knee, initial encounter: Secondary | ICD-10-CM | POA: Insufficient documentation

## 2015-06-14 DIAGNOSIS — Z86010 Personal history of colonic polyps: Secondary | ICD-10-CM | POA: Insufficient documentation

## 2015-06-14 DIAGNOSIS — S8991XA Unspecified injury of right lower leg, initial encounter: Secondary | ICD-10-CM | POA: Diagnosis present

## 2015-06-14 DIAGNOSIS — Y9389 Activity, other specified: Secondary | ICD-10-CM | POA: Diagnosis not present

## 2015-06-14 DIAGNOSIS — I5020 Unspecified systolic (congestive) heart failure: Secondary | ICD-10-CM | POA: Insufficient documentation

## 2015-06-14 DIAGNOSIS — Y92194 Driveway of other specified residential institution as the place of occurrence of the external cause: Secondary | ICD-10-CM | POA: Insufficient documentation

## 2015-06-14 DIAGNOSIS — S0033XA Contusion of nose, initial encounter: Secondary | ICD-10-CM | POA: Insufficient documentation

## 2015-06-14 DIAGNOSIS — Z8669 Personal history of other diseases of the nervous system and sense organs: Secondary | ICD-10-CM | POA: Insufficient documentation

## 2015-06-14 DIAGNOSIS — M7989 Other specified soft tissue disorders: Secondary | ICD-10-CM | POA: Diagnosis not present

## 2015-06-14 DIAGNOSIS — S0993XA Unspecified injury of face, initial encounter: Secondary | ICD-10-CM | POA: Diagnosis not present

## 2015-06-14 DIAGNOSIS — Z862 Personal history of diseases of the blood and blood-forming organs and certain disorders involving the immune mechanism: Secondary | ICD-10-CM | POA: Diagnosis not present

## 2015-06-14 DIAGNOSIS — S51811A Laceration without foreign body of right forearm, initial encounter: Secondary | ICD-10-CM

## 2015-06-14 DIAGNOSIS — S81811A Laceration without foreign body, right lower leg, initial encounter: Secondary | ICD-10-CM | POA: Diagnosis not present

## 2015-06-14 DIAGNOSIS — Z85828 Personal history of other malignant neoplasm of skin: Secondary | ICD-10-CM | POA: Diagnosis not present

## 2015-06-14 DIAGNOSIS — Z79899 Other long term (current) drug therapy: Secondary | ICD-10-CM | POA: Insufficient documentation

## 2015-06-14 DIAGNOSIS — Z23 Encounter for immunization: Secondary | ICD-10-CM | POA: Diagnosis not present

## 2015-06-14 DIAGNOSIS — E039 Hypothyroidism, unspecified: Secondary | ICD-10-CM | POA: Insufficient documentation

## 2015-06-14 DIAGNOSIS — Y998 Other external cause status: Secondary | ICD-10-CM | POA: Diagnosis not present

## 2015-06-14 DIAGNOSIS — S00531A Contusion of lip, initial encounter: Secondary | ICD-10-CM | POA: Insufficient documentation

## 2015-06-14 DIAGNOSIS — I10 Essential (primary) hypertension: Secondary | ICD-10-CM | POA: Insufficient documentation

## 2015-06-14 DIAGNOSIS — S8002XA Contusion of left knee, initial encounter: Secondary | ICD-10-CM | POA: Diagnosis not present

## 2015-06-14 DIAGNOSIS — G8929 Other chronic pain: Secondary | ICD-10-CM | POA: Diagnosis not present

## 2015-06-14 DIAGNOSIS — Z7982 Long term (current) use of aspirin: Secondary | ICD-10-CM | POA: Diagnosis not present

## 2015-06-14 DIAGNOSIS — Z8709 Personal history of other diseases of the respiratory system: Secondary | ICD-10-CM | POA: Insufficient documentation

## 2015-06-14 DIAGNOSIS — S0990XA Unspecified injury of head, initial encounter: Secondary | ICD-10-CM | POA: Diagnosis not present

## 2015-06-14 DIAGNOSIS — T07XXXA Unspecified multiple injuries, initial encounter: Secondary | ICD-10-CM

## 2015-06-14 DIAGNOSIS — K219 Gastro-esophageal reflux disease without esophagitis: Secondary | ICD-10-CM | POA: Diagnosis not present

## 2015-06-14 DIAGNOSIS — E785 Hyperlipidemia, unspecified: Secondary | ICD-10-CM | POA: Diagnosis not present

## 2015-06-14 DIAGNOSIS — M25562 Pain in left knee: Secondary | ICD-10-CM | POA: Diagnosis not present

## 2015-06-14 DIAGNOSIS — M25561 Pain in right knee: Secondary | ICD-10-CM | POA: Diagnosis not present

## 2015-06-14 DIAGNOSIS — S51801A Unspecified open wound of right forearm, initial encounter: Secondary | ICD-10-CM | POA: Diagnosis not present

## 2015-06-14 DIAGNOSIS — Z950 Presence of cardiac pacemaker: Secondary | ICD-10-CM | POA: Insufficient documentation

## 2015-06-14 DIAGNOSIS — S8992XA Unspecified injury of left lower leg, initial encounter: Secondary | ICD-10-CM | POA: Diagnosis not present

## 2015-06-14 DIAGNOSIS — S6991XA Unspecified injury of right wrist, hand and finger(s), initial encounter: Secondary | ICD-10-CM | POA: Diagnosis not present

## 2015-06-14 DIAGNOSIS — M79641 Pain in right hand: Secondary | ICD-10-CM | POA: Diagnosis not present

## 2015-06-14 DIAGNOSIS — Z880 Allergy status to penicillin: Secondary | ICD-10-CM | POA: Insufficient documentation

## 2015-06-14 DIAGNOSIS — S81801A Unspecified open wound, right lower leg, initial encounter: Secondary | ICD-10-CM | POA: Insufficient documentation

## 2015-06-14 MED ORDER — TETANUS-DIPHTHERIA TOXOIDS TD 5-2 LFU IM INJ
0.5000 mL | INJECTION | Freq: Once | INTRAMUSCULAR | Status: AC
  Administered 2015-06-14: 0.5 mL via INTRAMUSCULAR
  Filled 2015-06-14: qty 0.5

## 2015-06-14 MED ORDER — TETANUS-DIPHTH-ACELL PERTUSSIS 5-2.5-18.5 LF-MCG/0.5 IM SUSP
0.5000 mL | Freq: Once | INTRAMUSCULAR | Status: DC
  Filled 2015-06-14: qty 0.5

## 2015-06-14 MED ORDER — HYDROCODONE-ACETAMINOPHEN 5-325 MG PO TABS
1.0000 | ORAL_TABLET | Freq: Once | ORAL | Status: AC
  Administered 2015-06-14: 1 via ORAL
  Filled 2015-06-14: qty 1

## 2015-06-14 NOTE — ED Provider Notes (Signed)
CSN: [id number]     Arrival date & time 06/14/15  1223 History   First MD Initiated Contact with Patient 06/14/15 1233     Chief Complaint  Patient presents with  . Fall     (Consider location/radiation/quality/duration/timing/severity/associated sxs/prior Treatment) HPI  Pt presenting after fall.  She states she was out in her driveway and stepped on an uneven area causing her to fall.  She fell forward onto her knees hands and face.  She c/o pain in bilateral knees, right hand and arm, has bruising to her nose lips and face.  She felt that maybe her front teeth were loose, but now they feel that they are in the proper position.  Denies neck or back pain.   She was able to get up to a standing position and bear weight.  No syncope, no chest pain or palpitations or dizziness prior to the fall.  There are no other associated systemic symptoms, there are no other alleviating or modifying factors.   Past Medical History  Diagnosis Date  . GERD (gastroesophageal reflux disease)   . Chronic low back pain   . Polymyalgia     rheumatica-steroids per Rheum  . Insomnia   . Anxiety   . Hyperlipidemia   . HTN (hypertension)   . Chronic anemia   . Systolic heart failure   . Pacemaker   . Hiatal hernia   . Hypercholesteremia   . Colon polyps   . Aneurysm   . Allergic rhinitis   . Left heart failure   . Anemia     iron deficient  . Hypothyroidism   . Osteoporosis     S/P forteo treatment as of 02/2009  . Basal cell carcinoma    Past Surgical History  Procedure Laterality Date  . Tonsillectomy    . Abdominal hysterectomy    . Foot surgery    . Thyroidectomy    . Mitral valve repair    . Cardiac catheterization  10/16/06  . Cataract extraction    . Angioplasty    . Skin cancer removal    . Back surgery    . Defibulator implant     Family History  Problem Relation Age of Onset  . Colon cancer Brother   . Colon cancer Sister   . Breast cancer Sister   . Stroke Mother     in  her 75's  . Cancer Father 8    secondary   History  Substance Use Topics  . Smoking status: Never Smoker   . Smokeless tobacco: Not on file  . Alcohol Use: No   OB History    No data available     Review of Systems  ROS reviewed and all otherwise negative except for mentioned in HPI    Allergies  Pertussis vaccines; Sulfa antibiotics; and Amoxicillin  Home Medications   Prior to Admission medications   Medication Sig Start Date End Date Taking? Authorizing Provider  aspirin 81 MG tablet Take 81 mg by mouth daily.      Historical Provider, MD  atorvastatin (LIPITOR) 40 MG tablet Take 40 mg by mouth daily. 04/06/15   Historical Provider, MD  Calcium Carbonate (CALTRATE 600 PO) Take by mouth 2 (two) times daily.      Historical Provider, MD  Cholecalciferol (VITAMIN D3) 1000 UNITS CAPS Take by mouth daily.      Historical Provider, MD  denosumab (PROLIA) 60 MG/ML SOLN injection Inject 60 mg into the skin every 6 (six) months. Administer  in upper arm, thigh, or abdomen    Historical Provider, MD  furosemide (LASIX) 40 MG tablet TAKE 1 TABLET BY MOUTH IN THE MORNING AND ONE HALF TABLET IN THE EVENING. 11/03/14   Jettie Booze, MD  HYDROcodone-acetaminophen (NORCO/VICODIN) 5-325 MG per tablet Take 1 tablet by mouth every 6 (six) hours as needed. for pain 03/30/15   Historical Provider, MD  levothyroxine (SYNTHROID, LEVOTHROID) 75 MCG tablet Take 1 tablet (75 mcg total) by mouth daily. 11/03/14   Jettie Booze, MD  lisinopril (PRINIVIL,ZESTRIL) 5 MG tablet TAKE 1 TABLET ONE TIME DAILY 01/14/15   Jettie Booze, MD  methylPREDNISolone (MEDROL) 2 MG tablet Take 2 mg by mouth daily.      Historical Provider, MD  metoprolol succinate (TOPROL-XL) 100 MG 24 hr tablet Take 1 tablet (100 mg total) by mouth daily. Take with or immediately following a meal. 11/03/14   Jettie Booze, MD  Multiple Vitamin (MULTIVITAMIN PO) Take by mouth daily.      Historical Provider, MD   omeprazole (PRILOSEC OTC) 20 MG tablet Take 20 mg by mouth 2 (two) times daily.     Historical Provider, MD  potassium chloride SA (K-DUR,KLOR-CON) 20 MEQ tablet TAKE 1 TABLET IN THE MORNING  AND TAKE 1/2 TABLET IN THE EVENING 04/06/15   Jettie Booze, MD   BP 169/60 mmHg  Pulse 81  Temp(Src) 98.1 F (36.7 C) (Oral)  Resp 22  Ht 4\' 9"  (1.448 m)  Wt 119 lb (53.978 kg)  BMI 25.74 kg/m2  SpO2 99%  Vitals reviewed Physical Exam  Physical Examination: General appearance - alert, well appearing, and in no distress Mental status - alert, oriented to person, place, and time Head- NCAT except for facial bruising Eyes - pupils equal and reactive, extraocular eye movements intact Nose - swelling and contusion of bridge of nose with tenderness to palpation, otherwise  normal and patent, no epistaxis, no septal hematoma Mouth - mucous membranes moist, pharynx normal without lesions, bruising of upper and lower inner mucosa of lips, no laceration, no teeth loose on palpation, no maloclusion Neck - no midline tenderness to palpation Chest - clear to auscultation, no wheezes, rales or rhonchi, symmetric air entry Heart - normal rate, regular rhythm, normal S1, S2, no murmurs, rubs, clicks or gallops Abdomen - soft, nontender, nondistended, no masses or organomegaly Back exam - no midline tenderness to palpation no CVA tenderness Neurological - alert, oriented, normal speech, strength 5/5 in extremities x 4, sensation distally intact Musculoskeletal - bruising and ttp over bilateral patella- left greater than right, ttp over right forearm and right dorsum of hand, otherwise no joint tenderness, deformity or swelling Extremities - peripheral pulses normal, no pedal edema, no clubbing or cyanosis Skin - contusions under eyes, over bridge of nose and lips, contusions over right forearm and right hand, contusions over bilateral knees, skin tear on right lower extremity  ED Course  Procedures  (including critical care time) Labs Review Labs Reviewed - No data to display  Imaging Review Dg Forearm Right  06/14/2015   CLINICAL DATA:  Fall, right hand/forearm laceration.  EXAM: RIGHT FOREARM - 2 VIEW  COMPARISON:  None.  FINDINGS: Osseous alignment is normal. No fracture line or displaced fracture fragment identified. Soft tissue defect is present overlying the posterior aspects of the distal right radius consistent with given history of laceration.  IMPRESSION: Soft tissue laceration. No underlying osseous fracture or dislocation.   Electronically Signed   By: Cherlynn Kaiser  Enriqueta Shutter M.D.   On: 06/14/2015 13:43   Ct Head Wo Contrast  06/14/2015   CLINICAL DATA:  Fall with facial injury.  EXAM: CT HEAD WITHOUT CONTRAST  TECHNIQUE: Contiguous axial images were obtained from the base of the skull through the vertex without intravenous contrast.  COMPARISON:  None.  FINDINGS: There is mild generalized brain atrophy with commensurate dilatation of the ventricles and sulci. Mild chronic small vessel ischemic changes noted within the deep periventricular white matter. All other areas of the brain demonstrate normal gray-white matter attenuation.  There is no mass, hemorrhage, edema, or other evidence of acute parenchymal abnormality. No extra-axial hemorrhage. No osseous fracture or dislocation.  IMPRESSION: Mild atrophy and chronic ischemic changes in the periventricular white matter.  No acute findings. No intracranial hemorrhage or edema. No fracture.   Electronically Signed   By: Franki Cabot M.D.   On: 06/14/2015 13:29   Dg Knee Complete 4 Views Left  06/14/2015   CLINICAL DATA:  Acute left knee pain after falling while running. Initial encounter.  EXAM: LEFT KNEE - COMPLETE 4+ VIEW  COMPARISON:  None.  FINDINGS: There is no evidence of fracture, dislocation, or joint effusion. There is no evidence of arthropathy or other focal bone abnormality. Vascular calcifications are noted.  IMPRESSION: No  significant abnormality seen in the left knee.   Electronically Signed   By: Marijo Conception, M.D.   On: 06/14/2015 13:45   Dg Knee Complete 4 Views Right  06/14/2015   CLINICAL DATA:  Patient fell with pain and swelling  EXAM: RIGHT KNEE - COMPLETE 4+ VIEW  COMPARISON:  None.  FINDINGS: Frontal, lateral common bile oblique views were obtained. There is prepatellar soft tissue swelling, most notably slightly superior to the patella. There is no fracture or dislocation. No joint effusion. No appreciable joint space narrowing. No erosive change.  IMPRESSION: Prepatellar soft tissue swelling. There may be hematoma in this area. No apparent fracture or dislocation. No appreciable knee joint effusion.   Electronically Signed   By: Lowella Grip III M.D.   On: 06/14/2015 13:43   Dg Hand Complete Right  06/14/2015   CLINICAL DATA:  Right hand pain after a fall while running from a bee.  EXAM: RIGHT HAND - COMPLETE 3+ VIEW  COMPARISON:  None.  FINDINGS: No fracture or dislocation. Arthritic changes in the DIP joints of all of the digits as well as in the first through third metacarpal phalangeal joints. There is soft tissue calcification around the IP joint of the thumb.  IMPRESSION: No acute osseous abnormality.  Arthritic changes as described.   Electronically Signed   By: Lorriane Shire M.D.   On: 06/14/2015 13:44   Ct Maxillofacial Wo Cm  06/14/2015   CLINICAL DATA:  Fall with facial injury.  EXAM: CT MAXILLOFACIAL WITHOUT CONTRAST  TECHNIQUE: Multidetector CT imaging of the maxillofacial structures was performed. Multiplanar CT image reconstructions were also generated.  COMPARISON:  None.  FINDINGS: Lower frontal bones are intact and well aligned. Osseous structures about the orbits are intact and well aligned bilaterally. No displaced nasal bone fracture seen. Walls of the maxillary sinuses are intact and well aligned bilaterally. Bilateral zygoma and pterygoid plates are intact. No mandible fracture or  displacement seen. No tooth dislodgement identified.  Superficial soft tissues overlying the facial bones are unremarkable. No soft tissue hematoma or obvious soft tissue laceration.  IMPRESSION: No facial bone fracture or dislocation identified.   Electronically Signed   By: Cherlynn Kaiser  Enriqueta Shutter M.D.   On: 06/14/2015 13:40     EKG Interpretation None      MDM   Final diagnoses:  Fall, initial encounter  Multiple contusions  Skin tear of forearm without complication, right, initial encounter  Noninfected skin tear of lower extremity, right, initial encounter    Pt presenting with c/o fall.  She has multiple contusions of her face and extremities.  No neck or back pain.  Head CT and maxillofacial CTs reveal no acute abnormality.  xrays of extremities reveal no broken bones.   Xray images reviewed and interpreted by me as well.  Skin tears dressed and cleaned.  Pt advised to f/u with her PMD and dentist- she has no loose teeth on exam and no facial fractures, but there is some bleeding around her upper central incisors.  Discharged with strict return precautions.  Pt agreeable with plan.     Alfonzo Beers, MD 06/15/15 1032

## 2015-06-14 NOTE — ED Notes (Signed)
Patient transported to CT 

## 2015-06-14 NOTE — ED Notes (Signed)
Ice applied to left knee and face

## 2015-06-14 NOTE — ED Notes (Signed)
MD at bedside. 

## 2015-06-14 NOTE — Discharge Instructions (Signed)
Return to the ED with any concerns including vomiting, seizure activity, increased pain or swelling, pus draining, redness around wounds, decreased level of alertness/lethargy, or any other alarming symptoms

## 2015-06-14 NOTE — ED Notes (Signed)
Tripped and fell. Landed on her face. Swelling to her nose, abrasions to her nose and lip. Abrasions, skin tears and bruising to her right arm and right leg. Swelling to her left knee.

## 2015-06-17 DIAGNOSIS — T148 Other injury of unspecified body region: Secondary | ICD-10-CM | POA: Diagnosis not present

## 2015-06-17 DIAGNOSIS — S0031XD Abrasion of nose, subsequent encounter: Secondary | ICD-10-CM | POA: Diagnosis not present

## 2015-06-17 DIAGNOSIS — S81801A Unspecified open wound, right lower leg, initial encounter: Secondary | ICD-10-CM | POA: Diagnosis not present

## 2015-07-04 ENCOUNTER — Other Ambulatory Visit: Payer: Self-pay | Admitting: Interventional Cardiology

## 2015-07-07 ENCOUNTER — Ambulatory Visit (INDEPENDENT_AMBULATORY_CARE_PROVIDER_SITE_OTHER): Payer: Medicare Other | Admitting: *Deleted

## 2015-07-07 DIAGNOSIS — I5022 Chronic systolic (congestive) heart failure: Secondary | ICD-10-CM

## 2015-07-07 DIAGNOSIS — I429 Cardiomyopathy, unspecified: Secondary | ICD-10-CM

## 2015-07-07 NOTE — Progress Notes (Signed)
Remote ICD transmission.   

## 2015-07-13 LAB — CUP PACEART REMOTE DEVICE CHECK
Brady Statistic AP VS Percent: 0.02 %
Brady Statistic AS VP Percent: 97.71 %
Brady Statistic RA Percent Paced: 2.15 %
Brady Statistic RV Percent Paced: 99.84 %
HighPow Impedance: 304 Ohm
HighPow Impedance: 45 Ohm
HighPow Impedance: 62 Ohm
Lead Channel Impedance Value: 380 Ohm
Lead Channel Impedance Value: 399 Ohm
Lead Channel Impedance Value: 836 Ohm
Lead Channel Pacing Threshold Amplitude: 0.625 V
Lead Channel Pacing Threshold Amplitude: 1.125 V
Lead Channel Pacing Threshold Pulse Width: 0.4 ms
Lead Channel Sensing Intrinsic Amplitude: 16.625 mV
Lead Channel Sensing Intrinsic Amplitude: 2.875 mV
Lead Channel Sensing Intrinsic Amplitude: 2.875 mV
Lead Channel Setting Pacing Amplitude: 2 V
Lead Channel Setting Pacing Pulse Width: 1 ms
Lead Channel Setting Pacing Pulse Width: 1 ms
MDC IDC MSMT BATTERY VOLTAGE: 2.67 V
MDC IDC MSMT LEADCHNL LV IMPEDANCE VALUE: 589 Ohm
MDC IDC MSMT LEADCHNL LV PACING THRESHOLD PULSEWIDTH: 1 ms
MDC IDC MSMT LEADCHNL RV IMPEDANCE VALUE: 437 Ohm
MDC IDC MSMT LEADCHNL RV PACING THRESHOLD AMPLITUDE: 2.5 V
MDC IDC MSMT LEADCHNL RV PACING THRESHOLD PULSEWIDTH: 0.4 ms
MDC IDC MSMT LEADCHNL RV SENSING INTR AMPL: 16.625 mV
MDC IDC SESS DTM: 20160811062608
MDC IDC SET LEADCHNL LV PACING AMPLITUDE: 2 V
MDC IDC SET LEADCHNL RV PACING AMPLITUDE: 3.5 V
MDC IDC SET LEADCHNL RV SENSING SENSITIVITY: 0.3 mV
MDC IDC SET ZONE DETECTION INTERVAL: 350 ms
MDC IDC STAT BRADY AP VP PERCENT: 2.13 %
MDC IDC STAT BRADY AS VS PERCENT: 0.14 %
Zone Setting Detection Interval: 300 ms
Zone Setting Detection Interval: 350 ms
Zone Setting Detection Interval: 450 ms

## 2015-07-14 DIAGNOSIS — Z5181 Encounter for therapeutic drug level monitoring: Secondary | ICD-10-CM | POA: Diagnosis not present

## 2015-07-14 DIAGNOSIS — Z79891 Long term (current) use of opiate analgesic: Secondary | ICD-10-CM | POA: Diagnosis not present

## 2015-07-28 ENCOUNTER — Encounter: Payer: Self-pay | Admitting: Cardiology

## 2015-08-01 DIAGNOSIS — Z23 Encounter for immunization: Secondary | ICD-10-CM | POA: Diagnosis not present

## 2015-08-08 ENCOUNTER — Encounter: Payer: Self-pay | Admitting: Internal Medicine

## 2015-09-05 ENCOUNTER — Other Ambulatory Visit: Payer: Self-pay | Admitting: Interventional Cardiology

## 2015-09-06 ENCOUNTER — Telehealth: Payer: Self-pay | Admitting: Internal Medicine

## 2015-09-06 NOTE — Telephone Encounter (Signed)
Spoke with patient and let her know his office note says 6 months but she says she has been being followed yearly with Dr [name].  She will continue to follow remotely and keep her office follow ups for a year

## 2015-09-06 NOTE — Telephone Encounter (Signed)
New Message  Pt calling to clarify when pt is to come back. Per last OV (03/2015) AVS- Dr [name] wants pt back in 6 months. Recall is in the system from 1 year f/u for 03/2016. Pt stated that normally she f/u yearly. Please call back and discuss.

## 2015-09-12 NOTE — Patient Outreach (Signed)
[facility]) Care Management  09/12/2015  NANA COHN 1932/11/24 [id number]   Referral from [employer] Tier 2 List, assigned Jon Billings, RN to outreach for [place] Management services.  Thanks, [name] Freshwater. [place], [place] Assistant Phone: [phone] Fax: [phone]

## 2015-09-15 ENCOUNTER — Other Ambulatory Visit: Payer: Self-pay

## 2015-09-15 NOTE — Patient Outreach (Signed)
[facility]) Care Management  09/15/2015  [name] 1932-06-21 [id number]  Telephone call to patient for Tier 2 referral. Spoke with patient she was not really wanting to talk but stated that she is doing ok. Explained [facility] Care Management Services.  She reports she is getting to her appointments and not having any trouble with her medications.  Patient declined services but is receptive to receiving brochure for future reference.    Plan: RN Health Coach will send patient letter and brochure. RN Health Coach will send in basket for case closure to [place]line [place].   RN Health Coach will send primary doctor a closure letter.  Jone Baseman, RN, MSN [place] [phone]

## 2015-09-19 NOTE — Patient Outreach (Signed)
[facility]) Care Management  09/19/2015  NASHAI VANDENBROEK [birth year] [id number]   Notification from Jon Billings, RN to close case due to patient refused [place] Management services.  Thanks, [name] Freshwater. [place], [place] Assistant Phone: [phone] Fax: [phone]

## 2015-09-21 ENCOUNTER — Encounter: Payer: Self-pay | Admitting: *Deleted

## 2015-09-21 ENCOUNTER — Other Ambulatory Visit: Payer: Self-pay | Admitting: *Deleted

## 2015-09-21 NOTE — Patient Outreach (Signed)
I talked to Mrs. Lupton today to screen her for [facility] care management services. Her daughter works at [employer] and is her primary contact person. Mrs. Ordonez lives alone and is independent. She is comfortable driving in her neighborhood but doesn't like driving down [address] to the hospital area. She sees Dr. Harrington Challenger annually. She last saw him last summer when she had an accident in her yard. A yellow jacket started buzzing her when she was gardening and she backed up to get away and caught her foot on the drive way and fell. She sustained some injuries and was seen. She has not had any falls since then. She walks without a cane. She has a hx of several compression fractures and is being treated for osteoporosis. She is also going to pain management for her chronic back pain and polymyalgia rheumatica. She did have a hx of HF but after her valve repair surgery she has not had any further problem.  We decided at this time she does not have any need for care management services. I did suggest to her that it would be beneficial for her to weigh every day so she can recognize a wt gain that may mean she is having some heart dysfunction. She was receptive of this suggestion. I advised her I would send her a letter and our information for future reference. I emphasized that she should call Dr. Harrington Challenger or Dr. Miachel Roux if any medical problems arise early so she can be seen, treated and avoid complications and hospitalizations.  Deloria Lair [facility] [place] [phone]

## 2015-10-06 ENCOUNTER — Ambulatory Visit (INDEPENDENT_AMBULATORY_CARE_PROVIDER_SITE_OTHER): Payer: Medicare Other | Admitting: *Deleted

## 2015-10-06 DIAGNOSIS — I429 Cardiomyopathy, unspecified: Secondary | ICD-10-CM | POA: Diagnosis not present

## 2015-10-06 DIAGNOSIS — I5022 Chronic systolic (congestive) heart failure: Secondary | ICD-10-CM

## 2015-10-06 NOTE — Progress Notes (Signed)
Remote ICD transmission.   

## 2015-10-07 ENCOUNTER — Encounter: Payer: Self-pay | Admitting: Cardiology

## 2015-10-07 LAB — CUP PACEART REMOTE DEVICE CHECK
Brady Statistic AP VS Percent: 0.03 %
Brady Statistic AS VS Percent: 0.21 %
Brady Statistic RV Percent Paced: 99.76 %
Date Time Interrogation Session: 20161110051710
HIGH POWER IMPEDANCE MEASURED VALUE: 61 Ohm
HighPow Impedance: 323 Ohm
HighPow Impedance: 46 Ohm
Implantable Lead Implant Date: 20080612
Implantable Lead Location: 753858
Implantable Lead Location: 753860
Implantable Lead Model: 5076
Implantable Lead Model: 6947
Lead Channel Impedance Value: 437 Ohm
Lead Channel Impedance Value: 456 Ohm
Lead Channel Impedance Value: 589 Ohm
Lead Channel Pacing Threshold Amplitude: 0.625 V
Lead Channel Pacing Threshold Amplitude: 2.5 V
Lead Channel Pacing Threshold Pulse Width: 0.4 ms
Lead Channel Sensing Intrinsic Amplitude: 14.625 mV
Lead Channel Sensing Intrinsic Amplitude: 14.625 mV
Lead Channel Sensing Intrinsic Amplitude: 2.875 mV
Lead Channel Setting Pacing Amplitude: 2 V
Lead Channel Setting Pacing Amplitude: 3.5 V
Lead Channel Setting Pacing Pulse Width: 1 ms
Lead Channel Setting Sensing Sensitivity: 0.3 mV
MDC IDC LEAD IMPLANT DT: 20080612
MDC IDC LEAD IMPLANT DT: 20080612
MDC IDC LEAD LOCATION: 753859
MDC IDC LEAD MODEL: 4194
MDC IDC MSMT BATTERY VOLTAGE: 2.63 V
MDC IDC MSMT LEADCHNL LV IMPEDANCE VALUE: 855 Ohm
MDC IDC MSMT LEADCHNL LV PACING THRESHOLD AMPLITUDE: 0.875 V
MDC IDC MSMT LEADCHNL LV PACING THRESHOLD PULSEWIDTH: 1 ms
MDC IDC MSMT LEADCHNL RA IMPEDANCE VALUE: 380 Ohm
MDC IDC MSMT LEADCHNL RA SENSING INTR AMPL: 2.875 mV
MDC IDC MSMT LEADCHNL RV PACING THRESHOLD PULSEWIDTH: 0.4 ms
MDC IDC SET LEADCHNL LV PACING AMPLITUDE: 2 V
MDC IDC SET LEADCHNL LV PACING PULSEWIDTH: 1 ms
MDC IDC STAT BRADY AP VP PERCENT: 2.56 %
MDC IDC STAT BRADY AS VP PERCENT: 97.21 %
MDC IDC STAT BRADY RA PERCENT PACED: 2.59 %

## 2015-11-07 ENCOUNTER — Ambulatory Visit (INDEPENDENT_AMBULATORY_CARE_PROVIDER_SITE_OTHER): Payer: Medicare Other | Admitting: *Deleted

## 2015-11-07 DIAGNOSIS — Z95810 Presence of automatic (implantable) cardiac defibrillator: Secondary | ICD-10-CM

## 2015-11-07 NOTE — Progress Notes (Signed)
Remote ICD transmission.   

## 2015-11-08 ENCOUNTER — Encounter: Payer: Self-pay | Admitting: Cardiology

## 2015-11-08 LAB — CUP PACEART REMOTE DEVICE CHECK
Battery Voltage: 2.63 V
Brady Statistic AP VP Percent: 3.34 %
Brady Statistic AS VS Percent: 0.13 %
Brady Statistic RA Percent Paced: 3.38 %
Date Time Interrogation Session: 20161212083628
HighPow Impedance: 323 Ohm
HighPow Impedance: 44 Ohm
HighPow Impedance: 57 Ohm
Implantable Lead Implant Date: 20080612
Implantable Lead Location: 753859
Implantable Lead Model: 4194
Implantable Lead Model: 5076
Implantable Lead Model: 6947
Lead Channel Impedance Value: 380 Ohm
Lead Channel Impedance Value: 399 Ohm
Lead Channel Impedance Value: 399 Ohm
Lead Channel Impedance Value: 855 Ohm
Lead Channel Pacing Threshold Amplitude: 1.25 V
Lead Channel Pacing Threshold Amplitude: 2.5 V
Lead Channel Pacing Threshold Pulse Width: 0.4 ms
Lead Channel Pacing Threshold Pulse Width: 0.4 ms
Lead Channel Sensing Intrinsic Amplitude: 15.75 mV
Lead Channel Sensing Intrinsic Amplitude: 15.75 mV
Lead Channel Setting Pacing Amplitude: 2 V
Lead Channel Setting Pacing Amplitude: 2 V
Lead Channel Setting Pacing Pulse Width: 1 ms
Lead Channel Setting Pacing Pulse Width: 1 ms
MDC IDC LEAD IMPLANT DT: 20080612
MDC IDC LEAD IMPLANT DT: 20080612
MDC IDC LEAD LOCATION: 753858
MDC IDC LEAD LOCATION: 753860
MDC IDC MSMT LEADCHNL LV IMPEDANCE VALUE: 589 Ohm
MDC IDC MSMT LEADCHNL LV PACING THRESHOLD PULSEWIDTH: 1 ms
MDC IDC MSMT LEADCHNL RA PACING THRESHOLD AMPLITUDE: 0.625 V
MDC IDC MSMT LEADCHNL RA SENSING INTR AMPL: 2.875 mV
MDC IDC MSMT LEADCHNL RA SENSING INTR AMPL: 2.875 mV
MDC IDC SET LEADCHNL RV PACING AMPLITUDE: 3.5 V
MDC IDC SET LEADCHNL RV SENSING SENSITIVITY: 0.3 mV
MDC IDC STAT BRADY AP VS PERCENT: 0.04 %
MDC IDC STAT BRADY AS VP PERCENT: 96.49 %
MDC IDC STAT BRADY RV PERCENT PACED: 99.83 %

## 2015-12-08 ENCOUNTER — Ambulatory Visit (INDEPENDENT_AMBULATORY_CARE_PROVIDER_SITE_OTHER): Payer: Medicare Other | Admitting: *Deleted

## 2015-12-08 DIAGNOSIS — Z95810 Presence of automatic (implantable) cardiac defibrillator: Secondary | ICD-10-CM

## 2015-12-08 NOTE — Progress Notes (Signed)
Remote ICD transmission.   

## 2015-12-13 DIAGNOSIS — M353 Polymyalgia rheumatica: Secondary | ICD-10-CM | POA: Diagnosis not present

## 2015-12-13 DIAGNOSIS — E782 Mixed hyperlipidemia: Secondary | ICD-10-CM | POA: Diagnosis not present

## 2015-12-13 DIAGNOSIS — D509 Iron deficiency anemia, unspecified: Secondary | ICD-10-CM | POA: Diagnosis not present

## 2015-12-13 DIAGNOSIS — K219 Gastro-esophageal reflux disease without esophagitis: Secondary | ICD-10-CM | POA: Diagnosis not present

## 2015-12-13 DIAGNOSIS — E039 Hypothyroidism, unspecified: Secondary | ICD-10-CM | POA: Diagnosis not present

## 2015-12-13 DIAGNOSIS — Z0000 Encounter for general adult medical examination without abnormal findings: Secondary | ICD-10-CM | POA: Diagnosis not present

## 2015-12-13 DIAGNOSIS — I10 Essential (primary) hypertension: Secondary | ICD-10-CM | POA: Diagnosis not present

## 2015-12-16 LAB — CUP PACEART REMOTE DEVICE CHECK
Battery Voltage: 2.62 V
Brady Statistic AP VS Percent: 0.02 %
Brady Statistic RA Percent Paced: 2.66 %
Brady Statistic RV Percent Paced: 99.75 %
Date Time Interrogation Session: 20170112093627
HIGH POWER IMPEDANCE MEASURED VALUE: 53 Ohm
HighPow Impedance: 342 Ohm
HighPow Impedance: 41 Ohm
Implantable Lead Implant Date: 20080612
Implantable Lead Implant Date: 20080612
Implantable Lead Location: 753858
Implantable Lead Location: 753860
Implantable Lead Model: 4194
Implantable Lead Model: 5076
Implantable Lead Model: 6947
Lead Channel Impedance Value: 380 Ohm
Lead Channel Impedance Value: 437 Ohm
Lead Channel Pacing Threshold Amplitude: 0.625 V
Lead Channel Pacing Threshold Amplitude: 1.125 V
Lead Channel Pacing Threshold Amplitude: 2.5 V
Lead Channel Pacing Threshold Pulse Width: 0.4 ms
Lead Channel Pacing Threshold Pulse Width: 0.4 ms
Lead Channel Pacing Threshold Pulse Width: 1 ms
Lead Channel Sensing Intrinsic Amplitude: 14.375 mV
Lead Channel Sensing Intrinsic Amplitude: 14.375 mV
Lead Channel Sensing Intrinsic Amplitude: 2.75 mV
Lead Channel Sensing Intrinsic Amplitude: 2.75 mV
Lead Channel Setting Pacing Amplitude: 3.5 V
Lead Channel Setting Pacing Pulse Width: 1 ms
Lead Channel Setting Sensing Sensitivity: 0.3 mV
MDC IDC LEAD IMPLANT DT: 20080612
MDC IDC LEAD LOCATION: 753859
MDC IDC MSMT LEADCHNL LV IMPEDANCE VALUE: 532 Ohm
MDC IDC MSMT LEADCHNL LV IMPEDANCE VALUE: 779 Ohm
MDC IDC MSMT LEADCHNL RA IMPEDANCE VALUE: 342 Ohm
MDC IDC SET LEADCHNL LV PACING AMPLITUDE: 2 V
MDC IDC SET LEADCHNL LV PACING PULSEWIDTH: 1 ms
MDC IDC SET LEADCHNL RA PACING AMPLITUDE: 2 V
MDC IDC STAT BRADY AP VP PERCENT: 2.63 %
MDC IDC STAT BRADY AS VP PERCENT: 97.12 %
MDC IDC STAT BRADY AS VS PERCENT: 0.23 %

## 2015-12-23 ENCOUNTER — Encounter: Payer: Self-pay | Admitting: Cardiology

## 2016-01-03 ENCOUNTER — Telehealth: Payer: Self-pay | Admitting: Cardiology

## 2016-01-03 DIAGNOSIS — M109 Gout, unspecified: Secondary | ICD-10-CM | POA: Diagnosis not present

## 2016-01-03 DIAGNOSIS — L0390 Cellulitis, unspecified: Secondary | ICD-10-CM | POA: Diagnosis not present

## 2016-01-03 NOTE — Telephone Encounter (Signed)
Informed pt that her device reached ERI and that I would have scheduler call and schedule her appt w/ MD / PA / NP to come into the office to schedule an appt to talk about the procedure. Pt verbalized understanding.

## 2016-01-05 ENCOUNTER — Encounter: Payer: Self-pay | Admitting: Internal Medicine

## 2016-01-19 ENCOUNTER — Encounter: Payer: Medicare Other | Admitting: Physician Assistant

## 2016-01-31 ENCOUNTER — Other Ambulatory Visit: Payer: Self-pay

## 2016-01-31 ENCOUNTER — Other Ambulatory Visit: Payer: Self-pay | Admitting: Interventional Cardiology

## 2016-01-31 MED ORDER — FUROSEMIDE 40 MG PO TABS: ORAL_TABLET | ORAL | Status: DC

## 2016-02-07 ENCOUNTER — Encounter: Payer: Self-pay | Admitting: Internal Medicine

## 2016-02-07 ENCOUNTER — Ambulatory Visit (INDEPENDENT_AMBULATORY_CARE_PROVIDER_SITE_OTHER): Payer: Medicare Other | Admitting: Internal Medicine

## 2016-02-07 VITALS — BP 120/50 | HR 80 | Ht <= 58 in | Wt 116.4 lb

## 2016-02-07 DIAGNOSIS — I429 Cardiomyopathy, unspecified: Secondary | ICD-10-CM | POA: Diagnosis not present

## 2016-02-07 DIAGNOSIS — Z95810 Presence of automatic (implantable) cardiac defibrillator: Secondary | ICD-10-CM

## 2016-02-07 DIAGNOSIS — I5022 Chronic systolic (congestive) heart failure: Secondary | ICD-10-CM

## 2016-02-07 NOTE — Progress Notes (Signed)
HPI Mrs. Betteridge returns today for ICD followup. She is a pleasant 82 yo woman with a longstanding h/o chronic systolic heart failure, LBBB, HTN and severe arthritis, s/p BiV ICD implant. In the interim, she has been very stable from a CHF perspective. Her biggest problem has been related to arthritis and bone loss with multiple vertebral compression fractures. She has been on chronic steroid therapy for PMR.  She continues to complain of back pain which limits her. She has reached ERI on her ICD. Previously her EF had normalized. She has never received an ICD shock. Allergies  Allergen Reactions  . Pertussis Vaccines Swelling  . Sulfa Antibiotics Nausea Only  . Amoxicillin Rash     Current Outpatient Prescriptions  Medication Sig Dispense Refill  . aspirin 81 MG tablet Take 81 mg by mouth daily.      Marland Kitchen atorvastatin (LIPITOR) 40 MG tablet Take 40 mg by mouth daily.    . Calcium Carbonate (CALTRATE 600 PO) Take 1 tablet by mouth 2 (two) times daily.     . Cholecalciferol (VITAMIN D3) 1000 UNITS CAPS Take 1 capsule by mouth daily.     Marland Kitchen denosumab (PROLIA) 60 MG/ML SOLN injection Inject 60 mg into the skin every 6 (six) months. Administer in upper arm, thigh, or abdomen    . furosemide (LASIX) 40 MG tablet Take 40 mg by mouth every morning. Take 1/2 tablet by mouth in the evening    . HYDROcodone-acetaminophen (NORCO/VICODIN) 5-325 MG per tablet Take 1 tablet by mouth every 6 (six) hours as needed. for pain  0  . levothyroxine (SYNTHROID, LEVOTHROID) 75 MCG tablet Take 1 tablet (75 mcg total) by mouth daily. 90 tablet 1  . lisinopril (PRINIVIL,ZESTRIL) 5 MG tablet Take 5 mg by mouth daily.    . methylPREDNISolone (MEDROL) 2 MG tablet Take 2 mg by mouth daily.      . metoprolol succinate (TOPROL-XL) 100 MG 24 hr tablet Take 100 mg by mouth daily. Take with or immediately following a meal.    . Multiple Vitamin (MULTIVITAMIN PO) Take 1 tablet by mouth daily.     Marland Kitchen omeprazole (PRILOSEC  OTC) 20 MG tablet Take 20 mg by mouth 2 (two) times daily.     . potassium chloride SA (K-DUR,KLOR-CON) 20 MEQ tablet Take 20 mEq by mouth every morning. Take 1/2 tablet by mouth in the evening     No current facility-administered medications for this visit.     Past Medical History  Diagnosis Date  . GERD (gastroesophageal reflux disease)   . Chronic low back pain   . Polymyalgia ([place])     rheumatica-steroids per Rheum  . Insomnia   . Anxiety   . Hyperlipidemia   . HTN (hypertension)   . Chronic anemia   . Systolic heart failure   . Pacemaker   . Hiatal hernia   . Hypercholesteremia   . Colon polyps   . Aneurysm ([place])   . Allergic rhinitis   . Left heart failure ([place])   . Anemia     iron deficient  . Hypothyroidism   . Osteoporosis     S/P forteo treatment as of 02/2009  . Basal cell carcinoma     ROS:   All systems reviewed and negative except as noted in the HPI.   Past Surgical History  Procedure Laterality Date  . Tonsillectomy    . Abdominal hysterectomy    . Foot surgery    . Thyroidectomy    .  Mitral valve repair    . Cardiac catheterization  10/16/06  . Cataract extraction    . Angioplasty    . Skin cancer removal    . Back surgery    . Defibulator implant       Family History  Problem Relation Age of Onset  . Colon cancer Brother   . Colon cancer Sister   . Breast cancer Sister   . Stroke Mother     in her 39's  . Cancer Father 71    secondary     Social History   Social History  . Marital Status: Widowed    Spouse Name: N/A  . Number of Children: N/A  . Years of Education: N/A   Occupational History  . Not on file.   Social History Main Topics  . Smoking status: Never Smoker   . Smokeless tobacco: Not on file  . Alcohol Use: No  . Drug Use: Not on file  . Sexual Activity: Not on file   Other Topics Concern  . Not on file   Social History Narrative     BP 120/50 mmHg  Pulse 80  Ht 4\' 8"  (1.422 m)  Wt 116 lb 6.4 oz  (52.799 kg)  BMI 26.11 kg/m2  Physical Exam:  stable appearing elderly woman, NAD HEENT: Unremarkable Neck:  6 cm JVD, no thyromegally Back:  No CVA tenderness Lungs:  Clear with no wheezes, well healed PM incision. HEART:  Regular rate rhythm, no murmurs, no rubs, no clicks Abd:  soft, positive bowel sounds, no organomegally, no rebound, no guarding Ext:  2 plus pulses, no edema, no cyanosis, no clubbing Skin:  No rashes no nodules Neuro:  CN II through XII intact, motor grossly intact  ECG - NSR with biv pacing  DEVICE  Normal device function.  See PaceArt for details.   Assess/Plan: 1. Chronic systolic heart failure - her EF had normalized at her last echo over 4 years ago. Will recheck and if her EF remains above 35%, will plan to place a BiV PPM 2. ICD - her BiV device has reached ERI. Will plan a gen change with a down grade to a BiV PPM. 3. HTN - her blood pressure is well controlled. Will continue her current meds.  Sheridyn Canino,M.D. 3.

## 2016-02-07 NOTE — Patient Instructions (Addendum)
Medication Instructions:  Your physician recommends that you continue on your current medications as directed. Please refer to the Current Medication list given to you today.   Labwork: Your physician recommends that you return for lab work---will call with time pre procedure   Testing/Procedures: Your physician has requested that you have an echocardiogram. Echocardiography is a painless test that uses sound waves to create images of your heart. It provides your doctor with information about the size and shape of your heart and how well your heart's chambers and valves are working. This procedure takes approximately one hour. There are no restrictions for this procedure.  Will schedule generator change after the echo.  You will go to the [facility] for the generator change.  Time will be set after echo    Follow-Up:  Your physician recommends that you schedule a follow-up appointment will be after generator change     Any Other Special Instructions Will Be Listed Below (If Applicable).     If you need a refill on your cardiac medications before your next appointment, please call your pharmacy.

## 2016-02-21 ENCOUNTER — Other Ambulatory Visit: Payer: Self-pay

## 2016-02-21 ENCOUNTER — Ambulatory Visit (HOSPITAL_COMMUNITY): Payer: Medicare Other | Attending: Cardiology

## 2016-02-21 DIAGNOSIS — I340 Nonrheumatic mitral (valve) insufficiency: Secondary | ICD-10-CM | POA: Diagnosis not present

## 2016-02-21 DIAGNOSIS — E785 Hyperlipidemia, unspecified: Secondary | ICD-10-CM | POA: Diagnosis not present

## 2016-02-21 DIAGNOSIS — I110 Hypertensive heart disease with heart failure: Secondary | ICD-10-CM | POA: Insufficient documentation

## 2016-02-21 DIAGNOSIS — I5022 Chronic systolic (congestive) heart failure: Secondary | ICD-10-CM

## 2016-02-21 DIAGNOSIS — Z95810 Presence of automatic (implantable) cardiac defibrillator: Secondary | ICD-10-CM | POA: Insufficient documentation

## 2016-02-21 DIAGNOSIS — I447 Left bundle-branch block, unspecified: Secondary | ICD-10-CM | POA: Diagnosis not present

## 2016-02-21 DIAGNOSIS — I429 Cardiomyopathy, unspecified: Secondary | ICD-10-CM

## 2016-04-06 ENCOUNTER — Telehealth: Payer: Self-pay | Admitting: Internal Medicine

## 2016-04-06 NOTE — Telephone Encounter (Signed)
Returned call to patient and let her know she did not have to stop the Aspirin but could if she would like

## 2016-04-06 NOTE — Telephone Encounter (Signed)
[name] is wanting to know should she stop taking her aspirin before getting pacemaker placed . Thanks

## 2016-04-09 ENCOUNTER — Telehealth: Payer: Self-pay | Admitting: Internal Medicine

## 2016-04-09 NOTE — Telephone Encounter (Signed)
Spoke with patient and she is going to have her labs done at the hospital tomorrow. She is aware to be at the hospital at 7:30am for the case at 9:30

## 2016-04-09 NOTE — Telephone Encounter (Signed)
New Message  Pt has questions about her procedure, scheduled for tomorrow- 5/16. Please call back and discuss.

## 2016-04-10 ENCOUNTER — Encounter (HOSPITAL_COMMUNITY)
Admission: RE | Disposition: A | Discharge status: Home / Self Care | Payer: Self-pay | Source: Ambulatory Visit | Attending: Internal Medicine

## 2016-04-10 ENCOUNTER — Ambulatory Visit (HOSPITAL_COMMUNITY)
Admission: RE | Admit: 2016-04-10 | Discharge: 2016-04-10 | Disposition: A | Discharge status: Home / Self Care | Payer: Medicare Other | Source: Ambulatory Visit | Attending: Internal Medicine | Admitting: Internal Medicine

## 2016-04-10 ENCOUNTER — Encounter (HOSPITAL_COMMUNITY): Payer: Self-pay | Admitting: Internal Medicine

## 2016-04-10 DIAGNOSIS — F419 Anxiety disorder, unspecified: Secondary | ICD-10-CM | POA: Insufficient documentation

## 2016-04-10 DIAGNOSIS — D649 Anemia, unspecified: Secondary | ICD-10-CM | POA: Insufficient documentation

## 2016-04-10 DIAGNOSIS — Z86010 Personal history of colonic polyps: Secondary | ICD-10-CM | POA: Diagnosis not present

## 2016-04-10 DIAGNOSIS — E7800 Pure hypercholesterolemia, unspecified: Secondary | ICD-10-CM | POA: Diagnosis not present

## 2016-04-10 DIAGNOSIS — G4700 Insomnia, unspecified: Secondary | ICD-10-CM | POA: Diagnosis not present

## 2016-04-10 DIAGNOSIS — I447 Left bundle-branch block, unspecified: Secondary | ICD-10-CM | POA: Diagnosis not present

## 2016-04-10 DIAGNOSIS — M810 Age-related osteoporosis without current pathological fracture: Secondary | ICD-10-CM | POA: Diagnosis not present

## 2016-04-10 DIAGNOSIS — M1990 Unspecified osteoarthritis, unspecified site: Secondary | ICD-10-CM | POA: Insufficient documentation

## 2016-04-10 DIAGNOSIS — E039 Hypothyroidism, unspecified: Secondary | ICD-10-CM | POA: Diagnosis not present

## 2016-04-10 DIAGNOSIS — Z880 Allergy status to penicillin: Secondary | ICD-10-CM | POA: Diagnosis not present

## 2016-04-10 DIAGNOSIS — E785 Hyperlipidemia, unspecified: Secondary | ICD-10-CM | POA: Insufficient documentation

## 2016-04-10 DIAGNOSIS — M545 Low back pain: Secondary | ICD-10-CM | POA: Diagnosis not present

## 2016-04-10 DIAGNOSIS — Z95810 Presence of automatic (implantable) cardiac defibrillator: Secondary | ICD-10-CM | POA: Diagnosis present

## 2016-04-10 DIAGNOSIS — G8929 Other chronic pain: Secondary | ICD-10-CM | POA: Diagnosis not present

## 2016-04-10 DIAGNOSIS — Z85828 Personal history of other malignant neoplasm of skin: Secondary | ICD-10-CM | POA: Insufficient documentation

## 2016-04-10 DIAGNOSIS — I5022 Chronic systolic (congestive) heart failure: Secondary | ICD-10-CM | POA: Diagnosis not present

## 2016-04-10 DIAGNOSIS — I429 Cardiomyopathy, unspecified: Secondary | ICD-10-CM | POA: Diagnosis not present

## 2016-04-10 DIAGNOSIS — Z45010 Encounter for checking and testing of cardiac pacemaker pulse generator [battery]: Secondary | ICD-10-CM | POA: Diagnosis not present

## 2016-04-10 DIAGNOSIS — I110 Hypertensive heart disease with heart failure: Secondary | ICD-10-CM | POA: Insufficient documentation

## 2016-04-10 DIAGNOSIS — Z7982 Long term (current) use of aspirin: Secondary | ICD-10-CM | POA: Diagnosis not present

## 2016-04-10 DIAGNOSIS — Z7952 Long term (current) use of systemic steroids: Secondary | ICD-10-CM | POA: Insufficient documentation

## 2016-04-10 DIAGNOSIS — Z882 Allergy status to sulfonamides status: Secondary | ICD-10-CM | POA: Insufficient documentation

## 2016-04-10 DIAGNOSIS — M353 Polymyalgia rheumatica: Secondary | ICD-10-CM | POA: Insufficient documentation

## 2016-04-10 HISTORY — PX: EP IMPLANTABLE DEVICE: SHX172B

## 2016-04-10 LAB — CBC
HEMATOCRIT: 33.6 % — AB (ref 36.0–46.0)
Hemoglobin: 10.8 g/dL — ABNORMAL LOW (ref 12.0–15.0)
MCH: 30.5 pg (ref 26.0–34.0)
MCHC: 32.1 g/dL (ref 30.0–36.0)
MCV: 94.9 fL (ref 78.0–100.0)
PLATELETS: 241 10*3/uL (ref 150–400)
RBC: 3.54 MIL/uL — AB (ref 3.87–5.11)
RDW: 14.3 % (ref 11.5–15.5)
WBC: 9.7 10*3/uL (ref 4.0–10.5)

## 2016-04-10 LAB — BASIC METABOLIC PANEL
Anion gap: 12 (ref 5–15)
BUN: 38 mg/dL — AB (ref 6–20)
CO2: 23 mmol/L (ref 22–32)
Calcium: 9.5 mg/dL (ref 8.9–10.3)
Chloride: 106 mmol/L (ref 101–111)
Creatinine, Ser: 1.28 mg/dL — ABNORMAL HIGH (ref 0.44–1.00)
GFR calc Af Amer: 44 mL/min — ABNORMAL LOW (ref >60)
GFR, EST NON AFRICAN AMERICAN: 38 mL/min — AB (ref >60)
GLUCOSE: 89 mg/dL (ref 65–99)
POTASSIUM: 4.7 mmol/L (ref 3.5–5.1)
Sodium: 141 mmol/L (ref 135–145)

## 2016-04-10 LAB — SURGICAL PCR SCREEN
MRSA, PCR: NEGATIVE
Staphylococcus aureus: NEGATIVE

## 2016-04-10 SURGERY — BIV PACEMAKER INSERTION CRT-P
Anesthesia: LOCAL

## 2016-04-10 MED ORDER — FENTANYL CITRATE (PF) 100 MCG/2ML IJ SOLN
INTRAMUSCULAR | Status: DC | PRN
  Administered 2016-04-10 (×2): 12.5 ug via INTRAVENOUS

## 2016-04-10 MED ORDER — HEPARIN (PORCINE) IN NACL 2-0.9 UNIT/ML-% IJ SOLN
INTRAMUSCULAR | Status: AC
  Filled 2016-04-10: qty 1000

## 2016-04-10 MED ORDER — MUPIROCIN 2 % EX OINT
1.0000 "application " | TOPICAL_OINTMENT | Freq: Once | CUTANEOUS | Status: AC
  Administered 2016-04-10: 1 via TOPICAL

## 2016-04-10 MED ORDER — SODIUM CHLORIDE 0.9 % IV SOLN
INTRAVENOUS | Status: DC
  Administered 2016-04-10: 09:00:00 via INTRAVENOUS

## 2016-04-10 MED ORDER — LIDOCAINE HCL (PF) 1 % IJ SOLN
INTRAMUSCULAR | Status: AC
  Filled 2016-04-10: qty 60

## 2016-04-10 MED ORDER — MIDAZOLAM HCL 5 MG/5ML IJ SOLN
INTRAMUSCULAR | Status: DC | PRN
  Administered 2016-04-10 (×2): 1 mg via INTRAVENOUS

## 2016-04-10 MED ORDER — VANCOMYCIN HCL IN DEXTROSE 1-5 GM/200ML-% IV SOLN
INTRAVENOUS | Status: AC
  Filled 2016-04-10: qty 200

## 2016-04-10 MED ORDER — MIDAZOLAM HCL 5 MG/5ML IJ SOLN
INTRAMUSCULAR | Status: AC
  Filled 2016-04-10: qty 5

## 2016-04-10 MED ORDER — SODIUM CHLORIDE 0.9 % IR SOLN
Status: AC
  Filled 2016-04-10: qty 2

## 2016-04-10 MED ORDER — MUPIROCIN 2 % EX OINT
TOPICAL_OINTMENT | CUTANEOUS | Status: AC
  Filled 2016-04-10: qty 22

## 2016-04-10 MED ORDER — ONDANSETRON HCL 4 MG/2ML IJ SOLN: 4.0000 mg | Freq: Four times a day (QID) | INTRAMUSCULAR | Status: DC | PRN

## 2016-04-10 MED ORDER — ACETAMINOPHEN 325 MG PO TABS
325.0000 mg | ORAL_TABLET | ORAL | Status: DC | PRN
  Filled 2016-04-10: qty 2

## 2016-04-10 MED ORDER — FENTANYL CITRATE (PF) 100 MCG/2ML IJ SOLN
INTRAMUSCULAR | Status: AC
  Filled 2016-04-10: qty 2

## 2016-04-10 MED ORDER — LIDOCAINE HCL (PF) 1 % IJ SOLN
INTRAMUSCULAR | Status: DC | PRN
  Administered 2016-04-10: 22 mL via INTRADERMAL

## 2016-04-10 MED ORDER — CHLORHEXIDINE GLUCONATE 4 % EX LIQD
60.0000 mL | Freq: Once | CUTANEOUS | Status: DC
  Filled 2016-04-10: qty 60

## 2016-04-10 MED ORDER — SODIUM CHLORIDE 0.9 % IR SOLN
80.0000 mg | Status: AC
  Administered 2016-04-10: 80 mg

## 2016-04-10 MED ORDER — VANCOMYCIN HCL IN DEXTROSE 1-5 GM/200ML-% IV SOLN
1000.0000 mg | INTRAVENOUS | Status: AC
  Administered 2016-04-10: 1000 mg via INTRAVENOUS

## 2016-04-10 SURGICAL SUPPLY — 4 items
CABLE SURGICAL S-101-97-12 (CABLE) ×1 IMPLANT
PAD DEFIB LIFELINK (PAD) ×1 IMPLANT
PPM CONSULTA CRT-P C4TR01 (Pacemaker) ×1 IMPLANT
TRAY PACEMAKER INSERTION (PACKS) ×2 IMPLANT

## 2016-04-10 NOTE — H&P (Signed)
HPI [name] returns today for ICD followup. She is a pleasant 82 yo woman with a longstanding h/o chronic systolic heart failure, LBBB, HTN and severe arthritis, s/p BiV ICD implant. In the interim, she has been very stable from a CHF perspective. Her biggest problem has been related to arthritis and bone loss with multiple vertebral compression fractures. She has been on chronic steroid therapy for PMR. She continues to complain of back pain which limits her. She has reached ERI on her ICD. Previously her EF had normalized. She has never received an ICD shock. Allergies  Allergen Reactions  . Pertussis Vaccines Swelling  . Sulfa Antibiotics Nausea Only  . Amoxicillin Rash     Current Outpatient Prescriptions  Medication Sig Dispense Refill  . aspirin 81 MG tablet Take 81 mg by mouth daily.     Marland Kitchen atorvastatin (LIPITOR) 40 MG tablet Take 40 mg by mouth daily.    . Calcium Carbonate (CALTRATE 600 PO) Take 1 tablet by mouth 2 (two) times daily.     . Cholecalciferol (VITAMIN D3) 1000 UNITS CAPS Take 1 capsule by mouth daily.     Marland Kitchen denosumab (PROLIA) 60 MG/ML SOLN injection Inject 60 mg into the skin every 6 (six) months. Administer in upper arm, thigh, or abdomen    . furosemide (LASIX) 40 MG tablet Take 40 mg by mouth every morning. Take 1/2 tablet by mouth in the evening    . HYDROcodone-acetaminophen (NORCO/VICODIN) 5-325 MG per tablet Take 1 tablet by mouth every 6 (six) hours as needed. for pain  0  . levothyroxine (SYNTHROID, LEVOTHROID) 75 MCG tablet Take 1 tablet (75 mcg total) by mouth daily. 90 tablet 1  . lisinopril (PRINIVIL,ZESTRIL) 5 MG tablet Take 5 mg by mouth daily.    . methylPREDNISolone (MEDROL) 2 MG tablet Take 2 mg by mouth daily.     . metoprolol succinate (TOPROL-XL) 100 MG 24 hr tablet Take 100 mg by mouth daily. Take with or immediately following a meal.    . Multiple Vitamin  (MULTIVITAMIN PO) Take 1 tablet by mouth daily.     Marland Kitchen omeprazole (PRILOSEC OTC) 20 MG tablet Take 20 mg by mouth 2 (two) times daily.     . potassium chloride SA (K-DUR,KLOR-CON) 20 MEQ tablet Take 20 mEq by mouth every morning. Take 1/2 tablet by mouth in the evening     No current facility-administered medications for this visit.     Past Medical History  Diagnosis Date  . GERD (gastroesophageal reflux disease)   . Chronic low back pain   . Polymyalgia ([place])     rheumatica-steroids per Rheum  . Insomnia   . Anxiety   . Hyperlipidemia   . HTN (hypertension)   . Chronic anemia   . Systolic heart failure   . Pacemaker   . Hiatal hernia   . Hypercholesteremia   . Colon polyps   . Aneurysm ([place])   . Allergic rhinitis   . Left heart failure ([place])   . Anemia     iron deficient  . Hypothyroidism   . Osteoporosis     S/P forteo treatment as of 02/2009  . Basal cell carcinoma     ROS:  All systems reviewed and negative except as noted in the HPI.   Past Surgical History  Procedure Laterality Date  . Tonsillectomy    . Abdominal hysterectomy    . Foot surgery    . Thyroidectomy    .  Mitral valve repair    . Cardiac catheterization  10/16/06  . Cataract extraction    . Angioplasty    . Skin cancer removal    . Back surgery    . Defibulator implant       Family History  Problem Relation Age of Onset  . Colon cancer Brother   . Colon cancer Sister   . Breast cancer Sister   . Stroke Mother     in her 66's  . Cancer Father 1    secondary     Social History   Social History  . Marital Status: Widowed    Spouse Name: N/A  . Number of Children: N/A  . Years of Education: N/A   Occupational History  . Not on file.   Social History Main Topics  . Smoking  status: Never Smoker   . Smokeless tobacco: Not on file  . Alcohol Use: No  . Drug Use: Not on file  . Sexual Activity: Not on file   Other Topics Concern  . Not on file   Social History Narrative     BP 120/50 mmHg  Pulse 80  Ht 4\' 8"  (1.422 m)  Wt 116 lb 6.4 oz (52.799 kg)  BMI 26.11 kg/m2  Physical Exam:  stable appearing elderly woman, NAD HEENT: Unremarkable Neck: 6 cm JVD, no thyromegally Back: No CVA tenderness Lungs: Clear with no wheezes, well healed PM incision. HEART: Regular rate rhythm, no murmurs, no rubs, no clicks Abd: soft, positive bowel sounds, no organomegally, no rebound, no guarding Ext: 2 plus pulses, no edema, no cyanosis, no clubbing Skin: No rashes no nodules Neuro: CN II through XII intact, motor grossly intact  ECG - NSR with biv pacing  DEVICE  Normal device function. See PaceArt for details.   Assess/Plan: 1. Chronic systolic heart failure - her EF had normalized at her last echo over 4 years ago. Will recheck and if her EF remains above 35%, will plan to place a BiV PPM 2. ICD - her BiV device has reached ERI. Will plan a gen change with a down grade to a BiV PPM. 3. HTN - her blood pressure is well controlled. Will continue her current meds.  Ponciano Ort.       EP   Patient seen and examined. Agree with above. Since her last visit, her EF has improved and normalized. Will plan to change out her ICD for a biv PPM.  Mikle Bosworth.D.

## 2016-04-10 NOTE — H&P (Signed)
  ICD Criteria  Current LVEF:55%. Within 12 months prior to implant: Yes   Heart failure history: Yes, Class II  Cardiomyopathy history: Yes, Non-Ischemic Cardiomyopathy.  Atrial Fibrillation/Atrial Flutter: No.  Ventricular tachycardia history: No.  Cardiac arrest history: No.  History of syndromes with risk of sudden death: No.  Previous ICD: Yes, Reason for ICD:  Primary prevention.  Current ICD indication: Primary  PPM indication: Yes. Pacing type: Both. Greater than 40% RV pacing requirement anticipated. Indication: Complete Heart Block   Class I or II Bradycardia indication present: Yes  Beta Blocker therapy for 3 or more months: Yes, prescribed.   Ace Inhibitor/ARB therapy for 3 or more months: Yes, prescribed.

## 2016-04-10 NOTE — Discharge Instructions (Signed)
Pacemaker Battery Change, Care After Refer to this sheet in the next few weeks. These instructions provide you with information on caring for yourself after your procedure. Your health care provider may also give you more specific instructions. Your treatment has been planned according to current medical practices, but problems sometimes occur. Call your health care provider if you have any problems or questions after your procedure. WHAT TO EXPECT AFTER THE PROCEDURE After your procedure, it is typical to have the following sensations:  Soreness at the pacemaker site. HOME CARE INSTRUCTIONS   Keep the incision clean and dry.  Remove outer dressing tomorrow and do no get site wet for 10days  For the first week after the replacement, avoid stretching motions that pull at the incision site, and avoid heavy exercise with the arm that is on the same side as the incision.  Take medicines only as directed by your health care provider.  Keep all follow-up visits as directed by your health care provider. SEEK MEDICAL CARE IF:   You have pain at the incision site that is not relieved by over-the-counter or prescription medicine.  There is drainage or pus from the incision site.  There is swelling larger than a lime at the incision site.  You develop red streaking that extends above or below the incision site.  You feel brief, intermittent palpitations, light-headedness, or any symptoms that you feel might be related to your heart. SEEK IMMEDIATE MEDICAL CARE IF:   You experience chest pain that is different than the pain at the pacemaker site.  You experience shortness of breath.  You have palpitations or irregular heartbeat.  You have light-headedness that does not go away quickly.  You faint.  You have pain that gets worse and is not relieved by medicine.   This information is not intended to replace advice given to you by your health care provider. Make sure you discuss any  questions you have with your health care provider.   Document Released: 09/02/2013 Document Revised: 12/03/2014 Document Reviewed: 09/02/2013 Elsevier Interactive Patient Education [employer].

## 2016-04-11 MED FILL — Heparin Sodium (Porcine) 2 Unit/ML in Sodium Chloride 0.9%: INTRAMUSCULAR | Qty: 500 | Status: AC

## 2016-04-19 ENCOUNTER — Ambulatory Visit (INDEPENDENT_AMBULATORY_CARE_PROVIDER_SITE_OTHER): Payer: Medicare Other | Admitting: *Deleted

## 2016-04-19 ENCOUNTER — Encounter: Payer: Self-pay | Admitting: Internal Medicine

## 2016-04-19 DIAGNOSIS — I5022 Chronic systolic (congestive) heart failure: Secondary | ICD-10-CM

## 2016-04-19 DIAGNOSIS — I429 Cardiomyopathy, unspecified: Secondary | ICD-10-CM

## 2016-04-19 LAB — CUP PACEART INCLINIC DEVICE CHECK
Brady Statistic AP VP Percent: 0.38 %
Brady Statistic AP VS Percent: 0.01 %
Brady Statistic AS VS Percent: 0.3 %
Brady Statistic RV Percent Paced: 99.68 %
Implantable Lead Implant Date: 20080612
Implantable Lead Implant Date: 20080612
Implantable Lead Location: 753859
Implantable Lead Model: 6947
Lead Channel Impedance Value: 304 Ohm
Lead Channel Impedance Value: 342 Ohm
Lead Channel Impedance Value: 380 Ohm
Lead Channel Impedance Value: 494 Ohm
Lead Channel Impedance Value: 551 Ohm
Lead Channel Impedance Value: 817 Ohm
Lead Channel Pacing Threshold Amplitude: 1.375 V
Lead Channel Pacing Threshold Amplitude: 2 V
Lead Channel Pacing Threshold Pulse Width: 0.4 ms
Lead Channel Sensing Intrinsic Amplitude: 16.125 mV
Lead Channel Sensing Intrinsic Amplitude: 2.625 mV
Lead Channel Setting Pacing Amplitude: 2 V
Lead Channel Setting Pacing Amplitude: 3 V
Lead Channel Setting Pacing Pulse Width: 0.8 ms
Lead Channel Setting Pacing Pulse Width: 0.8 ms
MDC IDC LEAD IMPLANT DT: 20080612
MDC IDC LEAD LOCATION: 753858
MDC IDC LEAD LOCATION: 753860
MDC IDC LEAD MODEL: 4194
MDC IDC MSMT BATTERY VOLTAGE: 3.08 V
MDC IDC MSMT LEADCHNL LV IMPEDANCE VALUE: 399 Ohm
MDC IDC MSMT LEADCHNL LV IMPEDANCE VALUE: 665 Ohm
MDC IDC MSMT LEADCHNL LV PACING THRESHOLD PULSEWIDTH: 0.8 ms
MDC IDC MSMT LEADCHNL RA IMPEDANCE VALUE: 304 Ohm
MDC IDC MSMT LEADCHNL RA PACING THRESHOLD AMPLITUDE: 0.625 V
MDC IDC MSMT LEADCHNL RA SENSING INTR AMPL: 2.625 mV
MDC IDC MSMT LEADCHNL RV PACING THRESHOLD PULSEWIDTH: 0.4 ms
MDC IDC MSMT LEADCHNL RV SENSING INTR AMPL: 14.625 mV
MDC IDC SESS DTM: 20170525171122
MDC IDC SET LEADCHNL RA PACING AMPLITUDE: 1.5 V
MDC IDC SET LEADCHNL RV SENSING SENSITIVITY: 2.8 mV
MDC IDC STAT BRADY AS VP PERCENT: 99.3 %
MDC IDC STAT BRADY RA PERCENT PACED: 0.39 %

## 2016-04-19 NOTE — Progress Notes (Signed)
Wound check appointment. Steri-strips removed. Wound without redness or edema. Incision edges approximated, wound well healed. Stitch removed from mid-incision, area dressed with antibiotic ointment and (3) steri-strips (patient to remove in 3 days).  Normal device function. Thresholds, sensing, and impedances consistent with implant measurements. Device programmed at appropriate safety margins. LV output decreased to 2.0V @ 0.84ms (+1.0V safety margin) and RV/LV capture management was d/c'd due to d.stim. Histogram distribution appropriate for patient and level of activity. No mode switches or high ventricular rates noted. Patient educated about wound care, and arm mobility. ROV in 3 months with GT.

## 2016-06-04 ENCOUNTER — Other Ambulatory Visit: Payer: Self-pay | Admitting: Interventional Cardiology

## 2016-06-04 ENCOUNTER — Other Ambulatory Visit: Payer: Self-pay | Admitting: *Deleted

## 2016-06-04 DIAGNOSIS — I5022 Chronic systolic (congestive) heart failure: Secondary | ICD-10-CM

## 2016-06-04 MED ORDER — POTASSIUM CHLORIDE CRYS ER 20 MEQ PO TBCR: EXTENDED_RELEASE_TABLET | ORAL | Status: DC

## 2016-07-11 ENCOUNTER — Encounter: Payer: Self-pay | Admitting: Internal Medicine

## 2016-07-11 ENCOUNTER — Ambulatory Visit (INDEPENDENT_AMBULATORY_CARE_PROVIDER_SITE_OTHER): Payer: Medicare Other | Admitting: Internal Medicine

## 2016-07-11 VITALS — BP 164/62 | HR 73 | Ht <= 58 in | Wt 114.8 lb

## 2016-07-11 DIAGNOSIS — I5022 Chronic systolic (congestive) heart failure: Secondary | ICD-10-CM

## 2016-07-11 DIAGNOSIS — Z95810 Presence of automatic (implantable) cardiac defibrillator: Secondary | ICD-10-CM | POA: Diagnosis not present

## 2016-07-11 DIAGNOSIS — I429 Cardiomyopathy, unspecified: Secondary | ICD-10-CM

## 2016-07-11 LAB — CUP PACEART INCLINIC DEVICE CHECK
Battery Remaining Longevity: 87 mo
Brady Statistic AP VS Percent: 0.02 %
Brady Statistic AS VS Percent: 0.26 %
Brady Statistic RV Percent Paced: 99.72 %
Date Time Interrogation Session: 20170816125414
Implantable Lead Location: 753859
Implantable Lead Model: 5076
Implantable Lead Model: 6947
Lead Channel Impedance Value: 304 Ohm
Lead Channel Impedance Value: 399 Ohm
Lead Channel Impedance Value: 551 Ohm
Lead Channel Pacing Threshold Amplitude: 1.375 V
Lead Channel Pacing Threshold Amplitude: 2 V
Lead Channel Pacing Threshold Pulse Width: 0.4 ms
Lead Channel Pacing Threshold Pulse Width: 0.4 ms
Lead Channel Pacing Threshold Pulse Width: 0.8 ms
Lead Channel Sensing Intrinsic Amplitude: 14.75 mV
Lead Channel Setting Pacing Amplitude: 1.5 V
Lead Channel Setting Pacing Amplitude: 2 V
Lead Channel Setting Pacing Pulse Width: 0.4 ms
Lead Channel Setting Pacing Pulse Width: 0.6 ms
MDC IDC LEAD IMPLANT DT: 20080612
MDC IDC LEAD IMPLANT DT: 20080612
MDC IDC LEAD IMPLANT DT: 20080612
MDC IDC LEAD LOCATION: 753858
MDC IDC LEAD LOCATION: 753860
MDC IDC LEAD MODEL: 4194
MDC IDC MSMT BATTERY VOLTAGE: 3.03 V
MDC IDC MSMT LEADCHNL LV IMPEDANCE VALUE: 399 Ohm
MDC IDC MSMT LEADCHNL LV IMPEDANCE VALUE: 513 Ohm
MDC IDC MSMT LEADCHNL LV IMPEDANCE VALUE: 665 Ohm
MDC IDC MSMT LEADCHNL LV IMPEDANCE VALUE: 798 Ohm
MDC IDC MSMT LEADCHNL RA IMPEDANCE VALUE: 304 Ohm
MDC IDC MSMT LEADCHNL RA IMPEDANCE VALUE: 342 Ohm
MDC IDC MSMT LEADCHNL RA PACING THRESHOLD AMPLITUDE: 0.625 V
MDC IDC MSMT LEADCHNL RA SENSING INTR AMPL: 2.625 mV
MDC IDC SET LEADCHNL RV PACING AMPLITUDE: 3 V
MDC IDC SET LEADCHNL RV SENSING SENSITIVITY: 2.8 mV
MDC IDC STAT BRADY AP VP PERCENT: 9.43 %
MDC IDC STAT BRADY AS VP PERCENT: 90.29 %
MDC IDC STAT BRADY RA PERCENT PACED: 9.45 %

## 2016-07-11 NOTE — Patient Instructions (Signed)
Medication Instructions:  Your physician recommends that you continue on your current medications as directed. Please refer to the Current Medication list given to you today.   Labwork: None ordered  Testing/Procedures: None ordered  Follow-Up: Remote monitoring is used to monitor your Pacemaker of ICD from home. This monitoring reduces the number of office visits required to check your device to one time per year. It allows [place] to keep an eye on the functioning of your device to ensure it is working properly. You are scheduled for a device check from home on 10/10/16. You may send your transmission at any time that day. If you have a wireless device, the transmission will be sent automatically. After your physician reviews your transmission, you will receive a postcard with your next transmission date.   Your physician wants you to follow-up in: 1 year with Dr.Taylor You will receive a reminder letter in the mail two months in advance. If you don't receive a letter, please call our office to schedule the follow-up appointment.     Any Other Special Instructions Will Be Listed Below (If Applicable).     If you need a refill on your cardiac medications before your next appointment, please call your pharmacy.

## 2016-07-11 NOTE — Progress Notes (Signed)
HPI [name] returns today for ICD followup. She is a pleasant 82 yo woman with a longstanding h/o chronic systolic heart failure, LBBB, HTN and severe arthritis, s/p BiV PM implant. In the interim, she has been very stable from a CHF perspective. Her biggest problem has been related to arthritis and bone loss with multiple vertebral compression fractures. She has been on chronic steroid therapy for PMR. Previously her EF had normalized and for this reason she had a BIV PM. She c/o being anxious after driving down [employer]. Allergies  Allergen Reactions  . Pertussis Vaccines Swelling  . Sulfa Antibiotics Nausea Only  . Amoxicillin Rash and Other (See Comments)    Has patient had a PCN reaction causing immediate rash, facial/tongue/throat swelling, SOB or lightheadedness with hypotension: yes Has patient had a PCN reaction causing severe rash involving mucus membranes or skin necrosis: no Has patient had a PCN reaction that required hospitalization no Has patient had a PCN reaction occurring within the last 10 years: yes If all of the above answers are "NO", then may proceed with Cephalosporin use.      Current Outpatient Prescriptions  Medication Sig Dispense Refill  . aspirin EC 81 MG tablet Take 81 mg by mouth daily.    Marland Kitchen atorvastatin (LIPITOR) 40 MG tablet Take 40 mg by mouth daily.    . Calcium Carbonate (CALTRATE 600 PO) Take 600 mg by mouth 2 (two) times daily.     . Cholecalciferol (VITAMIN D3) 1000 UNITS CAPS Take 1,000 Units by mouth daily.     Marland Kitchen COLCRYS 0.6 MG tablet Take 0.6 mg by mouth 2 (two) times daily as needed (For gout flare-ups.).   0  . furosemide (LASIX) 40 MG tablet Take 20-40 mg by mouth 2 (two) times daily. Take one tablet in the morning and half a tablet in the evening.    Marland Kitchen HYDROcodone-acetaminophen (NORCO/VICODIN) 5-325 MG per tablet Take 1 tablet by mouth every 6 (six) hours as needed (For pain.).   0  . levothyroxine (SYNTHROID, LEVOTHROID) 75 MCG  tablet Take 1 tablet (75 mcg total) by mouth daily. 90 tablet 1  . lisinopril (PRINIVIL,ZESTRIL) 5 MG tablet Take 5 mg by mouth daily.    . methylPREDNISolone (MEDROL) 4 MG tablet Take 2 mg by mouth daily.    . metoprolol succinate (TOPROL-XL) 100 MG 24 hr tablet Take 100 mg by mouth daily. Take with or immediately following a meal.    . Multiple Vitamin (MULTIVITAMIN PO) Take 1 tablet by mouth daily.     Marland Kitchen omeprazole (PRILOSEC OTC) 20 MG tablet Take 20 mg by mouth 2 (two) times daily.     . potassium chloride SA (K-DUR,KLOR-CON) 20 MEQ tablet Take one tablet in the morning and half a tablet in the evening. 135 tablet 3   No current facility-administered medications for this visit.      Past Medical History:  Diagnosis Date  . Allergic rhinitis   . Anemia    iron deficient  . Aneurysm ([place])   . Anxiety   . Basal cell carcinoma   . Chronic anemia   . Chronic low back pain   . Colon polyps   . GERD (gastroesophageal reflux disease)   . Hiatal hernia   . HTN (hypertension)   . Hypercholesteremia   . Hyperlipidemia   . Hypothyroidism   . Insomnia   . Left heart failure ([place])   . Osteoporosis    S/P forteo treatment as of  02/2009  . Pacemaker   . Polymyalgia ([place])    rheumatica-steroids per Rheum  . Systolic heart failure     ROS:   All systems reviewed and negative except as noted in the HPI.   Past Surgical History:  Procedure Laterality Date  . ABDOMINAL HYSTERECTOMY    . ANGIOPLASTY    . BACK SURGERY    . CARDIAC CATHETERIZATION  10/16/06  . CATARACT EXTRACTION    . defibulator implant    . EP IMPLANTABLE DEVICE N/A 04/10/2016   Procedure: BiV Pacemaker Insertion CRT-P;  Surgeon: Evans Lance, MD;  Location: [facility];  Service: Cardiovascular;  Laterality: N/A;  . FOOT SURGERY    . MITRAL VALVE REPAIR    . skin cancer removal    . THYROIDECTOMY    . TONSILLECTOMY       Family History  Problem Relation Age of Onset  . Stroke Mother     in her  68's  . Cancer Father 54    secondary  . Colon cancer Brother   . Colon cancer Sister   . Breast cancer Sister      Social History   Social History  . Marital status: Widowed    Spouse name: N/A  . Number of children: N/A  . Years of education: N/A   Occupational History  . Not on file.   Social History Main Topics  . Smoking status: Never Smoker  . Smokeless tobacco: Not on file  . Alcohol use No  . Drug use: Unknown  . Sexual activity: Not on file   Other Topics Concern  . Not on file   Social History Narrative  . No narrative on file     BP (!) 164/62   Pulse 73   Ht 4' 8.5" (1.435 m)   Wt 114 lb 12.8 oz (52.1 kg)   BMI 25.28 kg/m   Physical Exam:  stable appearing elderly woman, NAD HEENT: Unremarkable Neck:  6 cm JVD, no thyromegally Back:  No CVA tenderness Lungs:  Clear with no wheezes, well healed PM incision. HEART:  Regular rate rhythm, no murmurs, no rubs, no clicks Abd:  soft, positive bowel sounds, no organomegally, no rebound, no guarding Ext:  2 plus pulses, no edema, no cyanosis, no clubbing Skin:  No rashes no nodules Neuro:  CN II through XII intact, motor grossly intact  ECG - NSR with biv pacing  DEVICE  Normal device function.  See PaceArt for details.   Assess/Plan: 1. Chronic systolic heart failure - her EF had normalized at her last echo several months ago and she has had a BiV PPM placed. 2. PPM - her Medtronic BiV PPM is working normally. Will reduced her output to maximize battery longevity. 3. HTN - her blood pressure is well controlled. Will continue her current meds.  Mickael Mcnutt,M.D. 3.

## 2016-08-02 DIAGNOSIS — Z23 Encounter for immunization: Secondary | ICD-10-CM | POA: Diagnosis not present

## 2016-10-02 ENCOUNTER — Other Ambulatory Visit: Payer: Self-pay | Admitting: Family Medicine

## 2016-10-02 DIAGNOSIS — Z1231 Encounter for screening mammogram for malignant neoplasm of breast: Secondary | ICD-10-CM

## 2016-10-09 ENCOUNTER — Encounter (HOSPITAL_COMMUNITY): Payer: Self-pay | Admitting: Emergency Medicine

## 2016-10-09 ENCOUNTER — Emergency Department (HOSPITAL_COMMUNITY): Payer: Medicare Other

## 2016-10-09 DIAGNOSIS — I110 Hypertensive heart disease with heart failure: Secondary | ICD-10-CM | POA: Diagnosis not present

## 2016-10-09 DIAGNOSIS — R002 Palpitations: Secondary | ICD-10-CM | POA: Diagnosis not present

## 2016-10-09 DIAGNOSIS — Z85828 Personal history of other malignant neoplasm of skin: Secondary | ICD-10-CM | POA: Diagnosis not present

## 2016-10-09 DIAGNOSIS — R0789 Other chest pain: Secondary | ICD-10-CM | POA: Diagnosis present

## 2016-10-09 DIAGNOSIS — E039 Hypothyroidism, unspecified: Secondary | ICD-10-CM | POA: Diagnosis not present

## 2016-10-09 DIAGNOSIS — I5022 Chronic systolic (congestive) heart failure: Secondary | ICD-10-CM | POA: Insufficient documentation

## 2016-10-09 DIAGNOSIS — Z950 Presence of cardiac pacemaker: Secondary | ICD-10-CM | POA: Insufficient documentation

## 2016-10-09 DIAGNOSIS — Z7982 Long term (current) use of aspirin: Secondary | ICD-10-CM | POA: Insufficient documentation

## 2016-10-09 DIAGNOSIS — R0602 Shortness of breath: Secondary | ICD-10-CM | POA: Diagnosis not present

## 2016-10-09 LAB — BASIC METABOLIC PANEL
ANION GAP: 9 (ref 5–15)
BUN: 37 mg/dL — ABNORMAL HIGH (ref 6–20)
CO2: 24 mmol/L (ref 22–32)
Calcium: 9.3 mg/dL (ref 8.9–10.3)
Chloride: 105 mmol/L (ref 101–111)
Creatinine, Ser: 1.23 mg/dL — ABNORMAL HIGH (ref 0.44–1.00)
GFR, EST AFRICAN AMERICAN: 46 mL/min — AB (ref >60)
GFR, EST NON AFRICAN AMERICAN: 40 mL/min — AB (ref >60)
Glucose, Bld: 137 mg/dL — ABNORMAL HIGH (ref 65–99)
POTASSIUM: 5.1 mmol/L (ref 3.5–5.1)
SODIUM: 138 mmol/L (ref 135–145)

## 2016-10-09 LAB — I-STAT TROPONIN, ED: TROPONIN I, POC: 0 ng/mL (ref 0.00–0.08)

## 2016-10-09 LAB — CBC
HEMATOCRIT: 34.1 % — AB (ref 36.0–46.0)
HEMOGLOBIN: 10.9 g/dL — AB (ref 12.0–15.0)
MCH: 29.8 pg (ref 26.0–34.0)
MCHC: 32 g/dL (ref 30.0–36.0)
MCV: 93.2 fL (ref 78.0–100.0)
Platelets: 312 10*3/uL (ref 150–400)
RBC: 3.66 MIL/uL — ABNORMAL LOW (ref 3.87–5.11)
RDW: 14.3 % (ref 11.5–15.5)
WBC: 10.6 10*3/uL — AB (ref 4.0–10.5)

## 2016-10-09 NOTE — ED Triage Notes (Signed)
Pt arrives with chest pain, states "doesn't feel right." Pt describes pain as a pressure, states "thumping" in pacemaker. States "thumping" has been going on intermittently since pacemaker was replaced, but worse in the last week. Reports generalized weakness and nausea. Denies SHOB, sweating. Bilateral lower extremity swelling.

## 2016-10-10 ENCOUNTER — Telehealth: Payer: Self-pay | Admitting: Cardiology

## 2016-10-10 ENCOUNTER — Encounter: Payer: Medicare Other | Admitting: *Deleted

## 2016-10-10 ENCOUNTER — Emergency Department (HOSPITAL_COMMUNITY)
Admission: EM | Admit: 2016-10-10 | Discharge: 2016-10-10 | Disposition: A | Discharge status: Home / Self Care | Payer: Medicare Other | Attending: Emergency Medicine | Admitting: Emergency Medicine

## 2016-10-10 DIAGNOSIS — R002 Palpitations: Secondary | ICD-10-CM | POA: Diagnosis not present

## 2016-10-10 MED ORDER — LORAZEPAM 1 MG PO TABS
1.0000 mg | ORAL_TABLET | Freq: Once | ORAL | Status: AC
  Administered 2016-10-10: 1 mg via ORAL
  Filled 2016-10-10: qty 1

## 2016-10-10 NOTE — ED Notes (Signed)
medtronic pacemaker interrogated - awaiting results.

## 2016-10-10 NOTE — ED Provider Notes (Signed)
[facility] DEPT Provider Note   CSN: [id number] Arrival date & time: 10/09/16  2211  By signing my name below, I, [name], attest that this documentation has been prepared under the direction and in the presence of Merryl Hacker, MD  Electronically Signed: Delton Thomas, ED Scribe. 10/10/16. 2:31 AM.   History   Chief Complaint Chief Complaint  Patient presents with  . Chest Pain   The history is provided by the patient. No language interpreter was used.   HPI Comments:  [name] is a 82 y.o. female, with a PSHx of a pacemaker insertion on 04/10/16, who presents to the Emergency Department complaining of sudden onset, intermittent episodes of chest discomfort and palpitations x 5 days. Pt states her pacemaker has been intermittently "thumping" every time she has chest discomfort. She notes her symptoms can occur when she is stressed as well.  Symptoms last for seconds at a time. Tonight she sat up from the couch and had worsening symptoms. No alleviating or exacerbating factors noted. Pt takes lasix on a daily basis. Pt denies SOB, nausea, vomiting, abdominal pain and any other symptoms/complaints at this time. Pt is followed by Dr. Irish Lack and Dr. [name].  Past Medical History:  Diagnosis Date  . Allergic rhinitis   . Anemia    iron deficient  . Aneurysm ([place])   . Anxiety   . Basal cell carcinoma   . Chronic anemia   . Chronic low back pain   . Colon polyps   . GERD (gastroesophageal reflux disease)   . Hiatal hernia   . HTN (hypertension)   . Hypercholesteremia   . Hyperlipidemia   . Hypothyroidism   . Insomnia   . Left heart failure ([place])   . Osteoporosis    S/P forteo treatment as of 02/2009  . Pacemaker   . Polymyalgia ([place])    rheumatica-steroids per Rheum  . Systolic heart failure     Patient Active Problem List   Diagnosis Date Noted  . Cardiomyopathy ([place]) 01/25/2014  . Mitral valve disorder 01/25/2014  . Combined hyperlipidemia  01/25/2014  . Biventricular implantable cardioverter-defibrillator in situ 04/24/2011  . Chronic systolic heart failure ([place]) 04/24/2011  . Hypertension, benign 04/24/2011  . Dyslipidemia 04/24/2011    Past Surgical History:  Procedure Laterality Date  . ABDOMINAL HYSTERECTOMY    . ANGIOPLASTY    . BACK SURGERY    . CARDIAC CATHETERIZATION  10/16/06  . CATARACT EXTRACTION    . defibulator implant    . EP IMPLANTABLE DEVICE N/A 04/10/2016   Procedure: BiV Pacemaker Insertion CRT-P;  Surgeon: Evans Lance, MD;  Location: [facility];  Service: Cardiovascular;  Laterality: N/A;  . FOOT SURGERY    . MITRAL VALVE REPAIR    . skin cancer removal    . THYROIDECTOMY    . TONSILLECTOMY      OB History    No data available       Home Medications    Prior to Admission medications   Medication Sig Start Date End Date Taking? Authorizing Provider  aspirin EC 81 MG tablet Take 81 mg by mouth daily.    Historical Provider, MD  atorvastatin (LIPITOR) 40 MG tablet Take 40 mg by mouth daily. 04/06/15   Historical Provider, MD  Calcium Carbonate (CALTRATE 600 PO) Take 600 mg by mouth 2 (two) times daily.     Historical Provider, MD  Cholecalciferol (VITAMIN D3) 1000 UNITS CAPS Take 1,000 Units by mouth daily.  Historical Provider, MD  COLCRYS 0.6 MG tablet Take 0.6 mg by mouth 2 (two) times daily as needed (For gout flare-ups.).  01/08/16   Historical Provider, MD  furosemide (LASIX) 40 MG tablet Take 20-40 mg by mouth 2 (two) times daily. Take one tablet in the morning and half a tablet in the evening.    Historical Provider, MD  HYDROcodone-acetaminophen (NORCO/VICODIN) 5-325 MG per tablet Take 1 tablet by mouth every 6 (six) hours as needed (For pain.).  03/30/15   Historical Provider, MD  levothyroxine (SYNTHROID, LEVOTHROID) 75 MCG tablet Take 1 tablet (75 mcg total) by mouth daily. 11/03/14   Jettie Booze, MD  lisinopril (PRINIVIL,ZESTRIL) 5 MG tablet Take 5 mg by mouth  daily.    Historical Provider, MD  methylPREDNISolone (MEDROL) 4 MG tablet Take 2 mg by mouth daily.    Historical Provider, MD  metoprolol succinate (TOPROL-XL) 100 MG 24 hr tablet Take 100 mg by mouth daily. Take with or immediately following a meal.    Historical Provider, MD  Multiple Vitamin (MULTIVITAMIN PO) Take 1 tablet by mouth daily.     Historical Provider, MD  omeprazole (PRILOSEC OTC) 20 MG tablet Take 20 mg by mouth 2 (two) times daily.     Historical Provider, MD  potassium chloride SA (K-DUR,KLOR-CON) 20 MEQ tablet Take one tablet in the morning and half a tablet in the evening. 06/04/16   Jettie Booze, MD    Family History Family History  Problem Relation Age of Onset  . Stroke Mother     in her 49's  . Cancer Father 57    secondary  . Colon cancer Brother   . Colon cancer Sister   . Breast cancer Sister     Social History Social History  Substance Use Topics  . Smoking status: Never Smoker  . Smokeless tobacco: Not on file  . Alcohol use No     Allergies   Pertussis vaccines; Sulfa antibiotics; and Amoxicillin   Review of Systems Review of Systems  Constitutional: Negative for fever.  Respiratory: Negative for shortness of breath.   Cardiovascular: Positive for chest pain, palpitations and leg swelling.  Gastrointestinal: Negative for abdominal pain, nausea and vomiting.  Psychiatric/Behavioral: The patient is nervous/anxious.   All other systems reviewed and are negative.    Physical Exam Updated Vital Signs BP 186/70   Pulse 109   Temp 97.3 F (36.3 C)   Resp 18   SpO2 100%   Physical Exam  Constitutional: She is oriented to person, place, and time. She appears well-developed and well-nourished. No distress.  Elderly  HENT:  Head: Normocephalic and atraumatic.  Eyes: Pupils are equal, round, and reactive to light.  Cardiovascular: Regular rhythm.   Murmur heard. Tachycardia  Pulmonary/Chest: Effort normal and breath sounds  normal. No respiratory distress. She has no wheezes.  Midline sternotomy scar well-healed  Abdominal: Soft. Bowel sounds are normal. There is no tenderness. There is no guarding.  Musculoskeletal: She exhibits edema.  1+ pitting edema bilateral lower extremities  Neurological: She is alert and oriented to person, place, and time.  Skin: Skin is warm and dry.  Psychiatric: She has a normal mood and affect.  Nursing note and vitals reviewed.    ED Treatments / Results  DIAGNOSTIC STUDIES:  Oxygen Saturation is 98% on RA, normal by my interpretation.    COORDINATION OF CARE:  2:26 AM Discussed treatment plan with pt at bedside and pt agreed to plan.  Labs (all  labs ordered are listed, but only abnormal results are displayed) Labs Reviewed  BASIC METABOLIC PANEL - Abnormal; Notable for the following:       Result Value   Glucose, Bld 137 (*)    BUN 37 (*)    Creatinine, Ser 1.23 (*)    GFR calc non Af Amer 40 (*)    GFR calc Af Amer 46 (*)    All other components within normal limits  CBC - Abnormal; Notable for the following:    WBC 10.6 (*)    RBC 3.66 (*)    Hemoglobin 10.9 (*)    HCT 34.1 (*)    All other components within normal limits  I-STAT TROPOININ, ED    EKG  EKG Interpretation  Date/Time:  Tuesday October 09 2016 22:15:31 EST Ventricular Rate:  116 PR Interval:  132 QRS Duration: 142 QT Interval:  378 QTC Calculation: 525 R Axis:   -113 Text Interpretation:  Atrial-sensed ventricular-paced rhythm Abnormal ECG Confirmed by Dina Rich  MD, Loma Sousa ([id number]) on 10/10/2016 2:29:31 AM       Radiology Dg Chest 2 View  Result Date: 10/09/2016 CLINICAL DATA:  Acute onset of shortness of breath, generalized chest discomfort and palpitations. Initial encounter. EXAM: CHEST  2 VIEW COMPARISON:  Chest radiograph performed 09/11/2011 FINDINGS: The lungs are well-aerated. Minimal bibasilar atelectasis is noted. There is no evidence of pleural effusion or  pneumothorax. The heart is mildly enlarged. The patient is status post median sternotomy. A valve replacement is noted. A pacemaker/AICD is noted overlying the left chest wall, with leads ending overlying the right atrium, right ventricle and coronary sinus. No acute osseous abnormalities are seen. The patient is status post vertebroplasty at multiple levels along the mid to lower thoracic spine, with numerous underlying chronic compression deformities. Diffuse calcification is seen along the abdominal aorta. IMPRESSION: 1. Minimal bibasilar atelectasis noted.  Mild cardiomegaly. 2. Diffuse aortic atherosclerosis noted. Electronically Signed   By: Garald Balding M.D.   On: 10/09/2016 23:02    Procedures Procedures (including critical care time)  Medications Ordered in ED Medications  LORazepam (ATIVAN) tablet 1 mg (not administered)     Initial Impression / Assessment and Plan / ED Course  I have reviewed the triage vital signs and the nursing notes.  Pertinent labs & imaging results that were available during my care of the patient were reviewed by me and considered in my medical decision making (see chart for details).  Clinical Course     Patient presents with intermittent episodes of palpitations and chest discomfort for the last 5 days. Also reports that she feels like her pacemaker is thumping. She is nontoxic. EKG is nonischemic. Chest x-ray is reassuring. Initial troponin is negative. Report of a pacemaker interrogation from [employer] staff over the phone shows appropriate pacemaker functioning. However, she did have 2 episodes of tachycardia in the 160s to 170s earlier this evening. Both less than 10 seconds. I discussed the workup with the patient and her daughter. She is very anxious about her symptoms. I feel her symptoms are likely related to these episodes of tachycardia. She has been without symptoms in the emergency room. She is mildly tachycardic. She may need adjustments in her  medications for symptom management. I will defer this to cardiology evaluation. I have encouraged patient to follow-up later today with cardiology.  After history, exam, and medical workup I feel the patient has been appropriately medically screened and is safe for discharge home. Pertinent diagnoses were discussed  with the patient. Patient was given return precautions.   Final Clinical Impressions(s) / ED Diagnoses   Final diagnoses:  Palpitations    New Prescriptions New Prescriptions   No medications on file   I personally performed the services described in this documentation, which was scribed in my presence. The recorded information has been reviewed and is accurate.     Merryl Hacker, MD 10/10/16 [phone]

## 2016-10-10 NOTE — Telephone Encounter (Signed)
Spoke with pt and reminded pt of remote transmission that is due today. Pt verbalized understanding.   

## 2016-10-10 NOTE — Progress Notes (Signed)
Cardiology Office Note Date:  10/11/2016  Patient ID:  Faryal, Marxen 01-08-1932, MRN [id number] PCP:  Melinda Crutch, MD  Electrophysiologist: Dr. [name]   Chief Complaint: post ER visit  History of Present Illness: KAHLIYAH DICK is a 82 y.o. female with history of VHD w/MV repair done in chronic CHF (systolic) last echo mention grade 2 DD with normalized EF, LBBB w/CRT-P, HTN, severe arthritis, PMR chronically on steroids, issues with bone loss and multiple compression fractures comes in to the office today to be seen for Dr. [name].  She was last seen by him only a few months ago in August, at that time doing well.     The patient states that after her device was reprogrammed in August the "thumping" started again, she figured it was something she would just need to live with though at times was almost unbearable, especially when laying on her left side. No SOB, no near syncope or syncope.  Yesterday when she went tot he ER she reported that it was thumping so hard she thought it was going to come out of her throat, made her feel some pressure in her chest and a it if a headache.    Today with her PPM check she had + diaphragmatic stim reproducing the thumping.    Device information: MDT CRT-P, implanted 05/08/07, gen change (downgrade from ICD to pacer) 04/10/16, Dr. [name]   Past Medical History:  Diagnosis Date  . Allergic rhinitis   . Anemia    iron deficient  . Aneurysm ([place])   . Anxiety   . Basal cell carcinoma   . Chronic anemia   . Chronic low back pain   . Colon polyps   . GERD (gastroesophageal reflux disease)   . Hiatal hernia   . HTN (hypertension)   . Hypercholesteremia   . Hyperlipidemia   . Hypothyroidism   . Insomnia   . Left heart failure ([place])   . Osteoporosis    S/P forteo treatment as of 02/2009  . Pacemaker   . Polymyalgia ([place])    rheumatica-steroids per Rheum  . Systolic heart failure     Past Surgical History:  Procedure Laterality Date  .  ABDOMINAL HYSTERECTOMY    . ANGIOPLASTY    . BACK SURGERY    . CARDIAC CATHETERIZATION  10/16/06  . CATARACT EXTRACTION    . defibulator implant    . EP IMPLANTABLE DEVICE N/A 04/10/2016   Procedure: BiV Pacemaker Insertion CRT-P;  Surgeon: Evans Lance, MD;  Location: [facility];  Service: Cardiovascular;  Laterality: N/A;  . FOOT SURGERY    . MITRAL VALVE REPAIR    . skin cancer removal    . THYROIDECTOMY    . TONSILLECTOMY      Current Outpatient Prescriptions  Medication Sig Dispense Refill  . aspirin EC 81 MG tablet Take 81 mg by mouth daily.    Marland Kitchen atorvastatin (LIPITOR) 40 MG tablet Take 40 mg by mouth daily.    . Calcium Carbonate (CALTRATE 600 PO) Take 600 mg by mouth 2 (two) times daily.     . Cholecalciferol (VITAMIN D3) 1000 UNITS CAPS Take 1,000 Units by mouth daily.     Marland Kitchen COLCRYS 0.6 MG tablet Take 0.6 mg by mouth 2 (two) times daily as needed (For gout flare-ups.).   0  . furosemide (LASIX) 40 MG tablet Take 20-40 mg by mouth 2 (two) times daily. Take one tablet in the morning and half a  tablet in the evening.    Marland Kitchen HYDROcodone-acetaminophen (NORCO/VICODIN) 5-325 MG per tablet Take 1 tablet by mouth every 6 (six) hours as needed (For pain.).   0  . levothyroxine (SYNTHROID, LEVOTHROID) 75 MCG tablet Take 1 tablet (75 mcg total) by mouth daily. 90 tablet 1  . lisinopril (PRINIVIL,ZESTRIL) 5 MG tablet Take 5 mg by mouth daily.    . methylPREDNISolone (MEDROL) 4 MG tablet Take 2 mg by mouth daily.    . metoprolol succinate (TOPROL-XL) 100 MG 24 hr tablet Take 100 mg by mouth daily. Take with or immediately following a meal.    . Multiple Vitamin (MULTIVITAMIN PO) Take 1 tablet by mouth daily.     Marland Kitchen omeprazole (PRILOSEC OTC) 20 MG tablet Take 20 mg by mouth 2 (two) times daily.     . potassium chloride SA (K-DUR,KLOR-CON) 20 MEQ tablet Take one tablet in the morning and half a tablet in the evening. 135 tablet 3   No current facility-administered medications for this  visit.     Allergies:   Pertussis vaccines; Sulfa antibiotics; and Amoxicillin   Social History:  The patient  reports that she has never smoked. She does not have any smokeless tobacco history on file. She reports that she does not drink alcohol.   Family History:  The patient's family history includes Breast cancer in her sister; Cancer (age of onset: 90) in her father; Colon cancer in her brother and sister; Stroke in her mother.  ROS:  Please see the history of present illness.  All other systems are reviewed and otherwise negative.   PHYSICAL EXAM:  VS:  BP (!) 158/74   Pulse 74   Ht 4' 8.5" (1.435 m)   Wt 113 lb (51.3 kg)   BMI 24.89 kg/m  BMI: Body mass index is 24.89 kg/m. Well nourished, well developed, in no acute distress  HEENT: normocephalic, atraumatic  Neck: no JVD, carotid bruits or masses Cardiac:  RRR no significant murmurs, no rubs, or gallops Lungs:  clear to auscultation bilaterally, no wheezing, rhonchi or rales  Abd: soft, nontender MS: no deformity or atrophy Ext: 1++ ankle edema  Skin: warm and dry, no rash Neuro:  No gross deficits appreciated Psych: euthymic mood, full affect  PPM site is stable, no tethering or discomfort   EKG:  Done 10/09/16, reviewed by myself today is ST 116bpm V paced PPM interrogation today is reviewed by myself: stable battery and lead measurements.  No episodes since cleared yesterday.  99% V (BiVe)  Pacing, reprogrammed RV/LV to fixed out puts to eliminate diaphragmatic stim.  After reprogramming could no longer reproduce even when lying on her left side.   02/21/16: TTE Study Conclusions - Left ventricle: The cavity size was normal. Wall thickness was   normal. Systolic function was normal. The estimated ejection   fraction was in the range of 55% to 60%. Septal bounce consistent   with prior cardiac surgery. Wall motion was normal; there were no   regional wall motion abnormalities. Features are consistent with   a  pseudonormal left ventricular filling pattern, with concomitant   abnormal relaxation and increased filling pressure (grade 2   diastolic dysfunction). - Aortic valve: There was no stenosis. - Mitral valve: Status post mitral valve repair. No significant   stenosis. There was trivial regurgitation. - Left atrium: The atrium was mildly dilated. - Right ventricle: The cavity size was normal. Pacer wire or   catheter noted in right ventricle. Systolic function was  mildly   reduced. - Tricuspid valve: Peak RV-RA gradient (S): 27 mm Hg. - Pulmonary arteries: PA peak pressure: 30 mm Hg (S). - Inferior vena cava: The vessel was normal in size. The   respirophasic diameter changes were in the normal range (= 50%),   consistent with normal central venous pressure. Impressions: - Normal LV size with EF 55-60%. Septal bounce consistent with   prior cardiac surgery. Normal RV size with mildly decreased   systolic function. S/p mitral valve repair with trivial MR and no   significant stenosis.  04/10/06: [facility] IMPRESSION:  1. No significant coronary artery disease to explain decreased left      ventricular function.  Moderate atherosclerosis in an ostial OM1 as      noted above.  2. Normal left ventricular end-diastolic pressure, severely reduced      left ventricular systolic contraction with an estimated ejection      fraction of 15-20%.  3. No renal artery stenosis.  Recent Labs: 10/09/2016: BUN 37; Creatinine, Ser 1.23; Hemoglobin 10.9; Platelets 312; Potassium 5.1; Sodium 138  No results found for requested labs within last 8760 hours.   Estimated Creatinine Clearance: 23.9 mL/min (by C-G formula based on SCr of 1.23 mg/dL (H)).   Wt Readings from Last 3 Encounters:  10/11/16 113 lb (51.3 kg)  07/11/16 114 lb 12.8 oz (52.1 kg)  04/10/16 118 lb (53.5 kg)     Other studies reviewed: Additional studies/records reviewed today include: summarized above  ASSESSMENT AND PLAN:  1. CP      Possibly secondary to ST with diaphragmatic stimulation     Trop 10/09/16 was 0.00, paced EKG        Discussed getting stress testing done vs monitoring symptoms now after device reprogramming.  At this time she prefers to hold off on stress testing to see how she feels without the "thumping"  2. VHD w/ MV repair     Stable by echo  3. Hx of NICM improved LVEF w/CRT-P     normal device function, reprogrammed to eliminate diaphragmatic stimulation     Optivol is below threshold but increasing, she has some ankle edema, cxr was clear     Increase her furosemide from 40 AM, 20PM to 40mg  BID for 2 days then resume her ususal         4. PAFlutter < 1 minute noted on ER transmission that had occurred on 08/28/16 (the only event since August interrogation)     Note other brief episodes of AHR with last interrogation with Dr. [name] as well (no EGMs to evaluate)     Will have her do a remote transmission in 1 month to evaluate/monitor further   Disposition: 6 months with Dr. [name], sooner if needed.  Should she have any recurrent symptoms to notify us and we will plan for stress testing, any further thumping to notify us for further reprogramming as well. To return to the ED if needed.  Current medicines are reviewed at length with the patient today.  The patient did not have any concerns regarding medicines.  Haywood Lasso, PA-C 10/11/2016 1:56 PM     [address] [place] [address] [phone] (office)  [phone] (fax)

## 2016-10-10 NOTE — Discharge Instructions (Signed)
You were seen today for palpitations and chest discomfort. Your pacemaker appears to be working appropriately. Your workup in the ER is reassuring. You did have 2 episodes earlier this evening where your heart rate got very rapid for less than 10 seconds. This could be the cause of your symptoms. Follow-up with your cardiologist very closely. If you have any new or worsening symptoms she needs to be reevaluated.

## 2016-10-10 NOTE — ED Notes (Signed)
Per [employer] staff, pt's pacemaker is functioning appropriately. Pt was noted to have had 2 episodes of tachycardia lasting less then 10 seconds. Dr. Dina Rich made aware

## 2016-10-11 ENCOUNTER — Ambulatory Visit (INDEPENDENT_AMBULATORY_CARE_PROVIDER_SITE_OTHER): Payer: Medicare Other | Admitting: Physician Assistant

## 2016-10-11 VITALS — BP 158/74 | HR 74 | Ht <= 58 in | Wt 113.0 lb

## 2016-10-11 DIAGNOSIS — I340 Nonrheumatic mitral (valve) insufficiency: Secondary | ICD-10-CM

## 2016-10-11 DIAGNOSIS — I208 Other forms of angina pectoris: Secondary | ICD-10-CM | POA: Diagnosis not present

## 2016-10-11 DIAGNOSIS — I4892 Unspecified atrial flutter: Secondary | ICD-10-CM | POA: Diagnosis not present

## 2016-10-11 DIAGNOSIS — I209 Angina pectoris, unspecified: Secondary | ICD-10-CM

## 2016-10-11 DIAGNOSIS — I5022 Chronic systolic (congestive) heart failure: Secondary | ICD-10-CM

## 2016-10-11 NOTE — Patient Instructions (Addendum)
Medication Instructions:   1. TODAY AND  FRIDAY ONLY  TAKE LASIX 40 MG TWICE A DAY   2. THEN RESUME BACK TO LASIX 40 MG IN AM AND 20 MG IN PM   If you need a refill on your cardiac medications before your next appointment, please call your pharmacy.  Labwork: NONE ORDERED  TODAY    Testing/Procedures: NONE ORDERED  TODAY    Follow-Up: Your physician wants you to follow-up in:  IN [place] will receive a reminder letter in the mail two months in advance. If you don't receive a letter, please call our office to schedule the follow-up appointment.   Remote monitoring is used to monitor your Pacemaker of ICD from home. This monitoring reduces the number of office visits required to check your device to one time per year. It allows [place] to keep an eye on the functioning of your device to ensure it is working properly. You are scheduled for a device check from home on . 11/09/16..You may send your transmission at any time that day. If you have a wireless device, the transmission will be sent automatically. After your physician reviews your transmission, you will receive a postcard with your next transmission date.     Any Other Special Instructions Will Be Listed Below (If Applicable).

## 2016-10-12 LAB — CUP PACEART INCLINIC DEVICE CHECK
Brady Statistic AP VS Percent: 0.02 %
Brady Statistic AS VP Percent: 91.72 %
Brady Statistic AS VS Percent: 0.13 %
Date Time Interrogation Session: 20171117001243
Implantable Lead Implant Date: 20080612
Implantable Lead Implant Date: 20080612
Implantable Lead Location: 753858
Implantable Lead Model: 6947
Lead Channel Impedance Value: 399 Ohm
Lead Channel Impedance Value: 513 Ohm
Lead Channel Impedance Value: 589 Ohm
Lead Channel Pacing Threshold Amplitude: 1.25 V
Lead Channel Pacing Threshold Pulse Width: 0.4 ms
Lead Channel Pacing Threshold Pulse Width: 0.4 ms
Lead Channel Sensing Intrinsic Amplitude: 14.25 mV
Lead Channel Sensing Intrinsic Amplitude: 15.75 mV
Lead Channel Setting Pacing Amplitude: 2 V
Lead Channel Setting Pacing Amplitude: 3.5 V
Lead Channel Setting Pacing Pulse Width: 1 ms
MDC IDC LEAD IMPLANT DT: 20080612
MDC IDC LEAD LOCATION: 753859
MDC IDC LEAD LOCATION: 753860
MDC IDC LEAD MODEL: 4194
MDC IDC MSMT BATTERY REMAINING LONGEVITY: 67 mo
MDC IDC MSMT BATTERY VOLTAGE: 3.01 V
MDC IDC MSMT LEADCHNL LV IMPEDANCE VALUE: 399 Ohm
MDC IDC MSMT LEADCHNL LV IMPEDANCE VALUE: 703 Ohm
MDC IDC MSMT LEADCHNL LV IMPEDANCE VALUE: 836 Ohm
MDC IDC MSMT LEADCHNL RA IMPEDANCE VALUE: 304 Ohm
MDC IDC MSMT LEADCHNL RA IMPEDANCE VALUE: 361 Ohm
MDC IDC MSMT LEADCHNL RA PACING THRESHOLD AMPLITUDE: 0.5 V
MDC IDC MSMT LEADCHNL RA SENSING INTR AMPL: 2.625 mV
MDC IDC MSMT LEADCHNL RA SENSING INTR AMPL: 2.625 mV
MDC IDC MSMT LEADCHNL RV IMPEDANCE VALUE: 304 Ohm
MDC IDC MSMT LEADCHNL RV PACING THRESHOLD AMPLITUDE: 1.75 V
MDC IDC MSMT LEADCHNL RV PACING THRESHOLD PULSEWIDTH: 1 ms
MDC IDC PG IMPLANT DT: 20170516
MDC IDC SET LEADCHNL LV PACING PULSEWIDTH: 0.4 ms
MDC IDC SET LEADCHNL RA PACING AMPLITUDE: 1.5 V
MDC IDC SET LEADCHNL RV SENSING SENSITIVITY: 2.8 mV
MDC IDC STAT BRADY AP VP PERCENT: 8.13 %
MDC IDC STAT BRADY RA PERCENT PACED: 8.08 %
MDC IDC STAT BRADY RV PERCENT PACED: 99.15 %

## 2016-11-20 ENCOUNTER — Other Ambulatory Visit: Payer: Self-pay | Admitting: Interventional Cardiology

## 2016-11-20 DIAGNOSIS — I5022 Chronic systolic (congestive) heart failure: Secondary | ICD-10-CM

## 2016-12-10 ENCOUNTER — Ambulatory Visit
Admission: RE | Admit: 2016-12-10 | Discharge: 2016-12-10 | Disposition: A | Discharge status: Home / Self Care | Payer: Medicare Other | Source: Ambulatory Visit | Attending: Family Medicine | Admitting: Family Medicine

## 2016-12-10 DIAGNOSIS — Z1231 Encounter for screening mammogram for malignant neoplasm of breast: Secondary | ICD-10-CM | POA: Diagnosis not present

## 2016-12-27 DIAGNOSIS — D509 Iron deficiency anemia, unspecified: Secondary | ICD-10-CM | POA: Diagnosis not present

## 2016-12-27 DIAGNOSIS — I10 Essential (primary) hypertension: Secondary | ICD-10-CM | POA: Diagnosis not present

## 2016-12-27 DIAGNOSIS — E782 Mixed hyperlipidemia: Secondary | ICD-10-CM | POA: Diagnosis not present

## 2016-12-27 DIAGNOSIS — E039 Hypothyroidism, unspecified: Secondary | ICD-10-CM | POA: Diagnosis not present

## 2016-12-27 DIAGNOSIS — R109 Unspecified abdominal pain: Secondary | ICD-10-CM | POA: Diagnosis not present

## 2016-12-27 DIAGNOSIS — Z0000 Encounter for general adult medical examination without abnormal findings: Secondary | ICD-10-CM | POA: Diagnosis not present

## 2016-12-27 DIAGNOSIS — M353 Polymyalgia rheumatica: Secondary | ICD-10-CM | POA: Diagnosis not present

## 2016-12-27 DIAGNOSIS — M109 Gout, unspecified: Secondary | ICD-10-CM | POA: Diagnosis not present

## 2017-01-04 DIAGNOSIS — R109 Unspecified abdominal pain: Secondary | ICD-10-CM | POA: Diagnosis not present

## 2017-01-09 ENCOUNTER — Other Ambulatory Visit (HOSPITAL_COMMUNITY): Payer: Self-pay | Admitting: Family Medicine

## 2017-01-09 DIAGNOSIS — R109 Unspecified abdominal pain: Secondary | ICD-10-CM

## 2017-01-10 ENCOUNTER — Ambulatory Visit (INDEPENDENT_AMBULATORY_CARE_PROVIDER_SITE_OTHER): Payer: Medicare Other | Admitting: *Deleted

## 2017-01-10 ENCOUNTER — Telehealth: Payer: Self-pay | Admitting: Cardiology

## 2017-01-10 DIAGNOSIS — I429 Cardiomyopathy, unspecified: Secondary | ICD-10-CM | POA: Diagnosis not present

## 2017-01-10 NOTE — Telephone Encounter (Signed)
Spoke with pt and reminded pt of remote transmission that is due today. Pt verbalized understanding.   

## 2017-01-10 NOTE — Progress Notes (Signed)
Remote pacemaker transmission.   

## 2017-01-15 ENCOUNTER — Encounter: Payer: Self-pay | Admitting: Cardiology

## 2017-01-16 ENCOUNTER — Encounter (HOSPITAL_COMMUNITY)
Admission: RE | Admit: 2017-01-16 | Discharge: 2017-01-16 | Disposition: A | Discharge status: Home / Self Care | Payer: Medicare Other | Source: Ambulatory Visit | Attending: Family Medicine | Admitting: Family Medicine

## 2017-01-16 DIAGNOSIS — R109 Unspecified abdominal pain: Secondary | ICD-10-CM

## 2017-01-16 MED ORDER — TECHNETIUM TC 99M MEBROFENIN IV KIT
5.0000 | PACK | Freq: Once | INTRAVENOUS | Status: AC | PRN
  Administered 2017-01-16: 5 via INTRAVENOUS

## 2017-01-18 LAB — CUP PACEART REMOTE DEVICE CHECK
Battery Remaining Longevity: 59 mo
Battery Voltage: 3 V
Brady Statistic AP VP Percent: 7.08 %
Brady Statistic AS VS Percent: 0.13 %
Brady Statistic RV Percent Paced: 99.7 %
Implantable Lead Implant Date: 20080612
Implantable Lead Location: 753859
Implantable Lead Model: 4194
Implantable Lead Model: 5076
Lead Channel Impedance Value: 361 Ohm
Lead Channel Impedance Value: 361 Ohm
Lead Channel Impedance Value: 380 Ohm
Lead Channel Impedance Value: 494 Ohm
Lead Channel Impedance Value: 665 Ohm
Lead Channel Pacing Threshold Amplitude: 0.625 V
Lead Channel Pacing Threshold Amplitude: 1.5 V
Lead Channel Pacing Threshold Amplitude: 2.375 V
Lead Channel Pacing Threshold Pulse Width: 0.4 ms
Lead Channel Pacing Threshold Pulse Width: 0.4 ms
Lead Channel Sensing Intrinsic Amplitude: 14.375 mV
Lead Channel Sensing Intrinsic Amplitude: 2.875 mV
Lead Channel Setting Pacing Amplitude: 1.5 V
Lead Channel Setting Pacing Amplitude: 3.5 V
Lead Channel Setting Pacing Pulse Width: 0.4 ms
Lead Channel Setting Pacing Pulse Width: 1 ms
Lead Channel Setting Sensing Sensitivity: 2.8 mV
MDC IDC LEAD IMPLANT DT: 20080612
MDC IDC LEAD IMPLANT DT: 20080612
MDC IDC LEAD LOCATION: 753858
MDC IDC LEAD LOCATION: 753860
MDC IDC MSMT LEADCHNL LV IMPEDANCE VALUE: 570 Ohm
MDC IDC MSMT LEADCHNL LV IMPEDANCE VALUE: 817 Ohm
MDC IDC MSMT LEADCHNL RA IMPEDANCE VALUE: 304 Ohm
MDC IDC MSMT LEADCHNL RA SENSING INTR AMPL: 2.875 mV
MDC IDC MSMT LEADCHNL RV IMPEDANCE VALUE: 418 Ohm
MDC IDC MSMT LEADCHNL RV PACING THRESHOLD PULSEWIDTH: 0.4 ms
MDC IDC MSMT LEADCHNL RV SENSING INTR AMPL: 14.375 mV
MDC IDC PG IMPLANT DT: 20170516
MDC IDC SESS DTM: 20180216005426
MDC IDC SET LEADCHNL LV PACING AMPLITUDE: 2 V
MDC IDC STAT BRADY AP VS PERCENT: 0.01 %
MDC IDC STAT BRADY AS VP PERCENT: 92.78 %
MDC IDC STAT BRADY RA PERCENT PACED: 7.08 %

## 2017-03-11 ENCOUNTER — Other Ambulatory Visit: Payer: Self-pay | Admitting: Interventional Cardiology

## 2017-03-11 DIAGNOSIS — I5022 Chronic systolic (congestive) heart failure: Secondary | ICD-10-CM

## 2017-04-01 ENCOUNTER — Encounter: Payer: Self-pay | Admitting: Internal Medicine

## 2017-04-16 ENCOUNTER — Ambulatory Visit (INDEPENDENT_AMBULATORY_CARE_PROVIDER_SITE_OTHER): Payer: Medicare Other | Admitting: Internal Medicine

## 2017-04-16 ENCOUNTER — Encounter: Payer: Self-pay | Admitting: Internal Medicine

## 2017-04-16 VITALS — BP 152/78 | HR 79 | Ht <= 58 in | Wt 119.8 lb

## 2017-04-16 DIAGNOSIS — I429 Cardiomyopathy, unspecified: Secondary | ICD-10-CM | POA: Diagnosis not present

## 2017-04-16 DIAGNOSIS — I5022 Chronic systolic (congestive) heart failure: Secondary | ICD-10-CM

## 2017-04-16 DIAGNOSIS — Z95810 Presence of automatic (implantable) cardiac defibrillator: Secondary | ICD-10-CM

## 2017-04-16 LAB — CUP PACEART INCLINIC DEVICE CHECK
Battery Remaining Longevity: 51 mo
Battery Voltage: 3 V
Brady Statistic AP VP Percent: 7.09 %
Brady Statistic RA Percent Paced: 7.09 %
Brady Statistic RV Percent Paced: 99.66 %
Date Time Interrogation Session: 20180522105258
Implantable Lead Implant Date: 20080612
Implantable Lead Implant Date: 20080612
Implantable Lead Location: 753858
Implantable Lead Location: 753860
Implantable Lead Model: 4194
Implantable Lead Model: 6947
Implantable Pulse Generator Implant Date: 20170516
Lead Channel Impedance Value: 304 Ohm
Lead Channel Impedance Value: 361 Ohm
Lead Channel Impedance Value: 399 Ohm
Lead Channel Impedance Value: 551 Ohm
Lead Channel Impedance Value: 779 Ohm
Lead Channel Pacing Threshold Pulse Width: 0.4 ms
Lead Channel Pacing Threshold Pulse Width: 0.4 ms
Lead Channel Sensing Intrinsic Amplitude: 14.125 mV
Lead Channel Sensing Intrinsic Amplitude: 2.5 mV
Lead Channel Sensing Intrinsic Amplitude: 2.625 mV
Lead Channel Setting Pacing Amplitude: 2 V
MDC IDC LEAD IMPLANT DT: 20080612
MDC IDC LEAD LOCATION: 753859
MDC IDC MSMT LEADCHNL LV IMPEDANCE VALUE: 494 Ohm
MDC IDC MSMT LEADCHNL LV IMPEDANCE VALUE: 665 Ohm
MDC IDC MSMT LEADCHNL LV PACING THRESHOLD AMPLITUDE: 1.5 V
MDC IDC MSMT LEADCHNL LV PACING THRESHOLD PULSEWIDTH: 0.4 ms
MDC IDC MSMT LEADCHNL RA IMPEDANCE VALUE: 342 Ohm
MDC IDC MSMT LEADCHNL RA PACING THRESHOLD AMPLITUDE: 0.625 V
MDC IDC MSMT LEADCHNL RV IMPEDANCE VALUE: 304 Ohm
MDC IDC MSMT LEADCHNL RV PACING THRESHOLD AMPLITUDE: 2.375 V
MDC IDC MSMT LEADCHNL RV SENSING INTR AMPL: 13.625 mV
MDC IDC SET LEADCHNL LV PACING PULSEWIDTH: 0.4 ms
MDC IDC SET LEADCHNL RA PACING AMPLITUDE: 1.5 V
MDC IDC SET LEADCHNL RV PACING AMPLITUDE: 2.5 V
MDC IDC SET LEADCHNL RV PACING PULSEWIDTH: 0.8 ms
MDC IDC SET LEADCHNL RV SENSING SENSITIVITY: 2.8 mV
MDC IDC STAT BRADY AP VS PERCENT: 0.01 %
MDC IDC STAT BRADY AS VP PERCENT: 92.76 %
MDC IDC STAT BRADY AS VS PERCENT: 0.14 %

## 2017-04-16 NOTE — Progress Notes (Signed)
HPI [name] returns today for ongoing biv ppm followup. She is a pleasant 83 yo woman with a longstanding h/o chronic systolic heart failure, LBBB, HTN and severe arthritis, s/p BiV PM implant. She has had normalization of her LV function with Biv pacing. In the interim, she has been very stable from a CHF perspective. Her biggest problem has been related to arthritis and bone loss with multiple vertebral compression fractures. She has been on chronic steroid therapy for PMR.  Despite this, she has been active in her yard. She tries to keep her house and cooks and goes shopping. She has trouble walking.  Allergies  Allergen Reactions  . Pertussis Vaccines Swelling  . Sulfa Antibiotics Nausea Only  . Amoxicillin Rash and Other (See Comments)    Has patient had a PCN reaction causing immediate rash, facial/tongue/throat swelling, SOB or lightheadedness with hypotension: yes Has patient had a PCN reaction causing severe rash involving mucus membranes or skin necrosis: no Has patient had a PCN reaction that required hospitalization no Has patient had a PCN reaction occurring within the last 10 years: yes If all of the above answers are "NO", then may proceed with Cephalosporin use.      Current Outpatient Prescriptions  Medication Sig Dispense Refill  . aspirin EC 81 MG tablet Take 81 mg by mouth daily.    Marland Kitchen atorvastatin (LIPITOR) 40 MG tablet Take 40 mg by mouth daily.    . Calcium Carbonate (CALTRATE 600 PO) Take 600 mg by mouth 2 (two) times daily.     . Cholecalciferol (VITAMIN D3) 1000 UNITS CAPS Take 1,000 Units by mouth daily.     Marland Kitchen COLCRYS 0.6 MG tablet Take 0.6 mg by mouth 2 (two) times daily as needed (For gout flare-ups.).   0  . furosemide (LASIX) 40 MG tablet TAKE 1 TABLET BY MOUTH EVERY MORNING  AND TAKE 1/2 TABLET IN THE EVENING    . HYDROcodone-acetaminophen (NORCO/VICODIN) 5-325 MG per tablet Take 1 tablet by mouth every 6 (six) hours as needed (For pain.).   0  .  levothyroxine (SYNTHROID, LEVOTHROID) 75 MCG tablet Take 1 tablet (75 mcg total) by mouth daily. 90 tablet 1  . lisinopril (PRINIVIL,ZESTRIL) 5 MG tablet Take 5 mg by mouth daily.    . methylPREDNISolone (MEDROL) 4 MG tablet Take 2 mg by mouth daily.    . metoprolol succinate (TOPROL-XL) 100 MG 24 hr tablet Take 100 mg by mouth daily. Take with or immediately following a meal.    . Multiple Vitamin (MULTIVITAMIN PO) Take 1 tablet by mouth daily.     Marland Kitchen omeprazole (PRILOSEC OTC) 20 MG tablet Take 20 mg by mouth 2 (two) times daily.     . potassium chloride SA (K-DUR,KLOR-CON) 20 MEQ tablet Take one (1) tablet (20 meq total) by mouth each morning and half (1/2) tablet (10 meq total) by mouth each evening. 135 tablet 2   No current facility-administered medications for this visit.      Past Medical History:  Diagnosis Date  . Allergic rhinitis   . Anemia    iron deficient  . Aneurysm ([place])   . Anxiety   . Basal cell carcinoma   . Chronic anemia   . Chronic low back pain   . Colon polyps   . GERD (gastroesophageal reflux disease)   . Hiatal hernia   . HTN (hypertension)   . Hypercholesteremia   . Hyperlipidemia   . Hypothyroidism   . Insomnia   .  Left heart failure ([place])   . Osteoporosis    S/P forteo treatment as of 02/2009  . Pacemaker   . Polymyalgia ([place])    rheumatica-steroids per Rheum  . Systolic heart failure     ROS:   All systems reviewed and negative except as noted in the HPI.   Past Surgical History:  Procedure Laterality Date  . ABDOMINAL HYSTERECTOMY    . ANGIOPLASTY    . BACK SURGERY    . CARDIAC CATHETERIZATION  10/16/06  . CATARACT EXTRACTION    . defibulator implant    . EP IMPLANTABLE DEVICE N/A 04/10/2016   Procedure: BiV Pacemaker Insertion CRT-P;  Surgeon: Evans Lance, MD;  Location: [facility];  Service: Cardiovascular;  Laterality: N/A;  . FOOT SURGERY    . MITRAL VALVE REPAIR    . skin cancer removal    . THYROIDECTOMY    .  TONSILLECTOMY       Family History  Problem Relation Age of Onset  . Stroke Mother        in her 32's  . Cancer Father 59       secondary  . Colon cancer Brother   . Colon cancer Sister   . Breast cancer Sister      Social History   Social History  . Marital status: Widowed    Spouse name: N/A  . Number of children: N/A  . Years of education: N/A   Occupational History  . Not on file.   Social History Main Topics  . Smoking status: Never Smoker  . Smokeless tobacco: Never Used  . Alcohol use No  . Drug use: No  . Sexual activity: Not on file   Other Topics Concern  . Not on file   Social History Narrative  . No narrative on file     BP (!) 152/78   Pulse 79   Ht 4' 8.5" (1.435 m)   Wt 119 lb 12.8 oz (54.3 kg)   SpO2 99%   BMI 26.39 kg/m   Physical Exam:  stable appearing elderly woman, NAD HEENT: Unremarkable Neck:  6 cm JVD, no thyromegally Back:  No CVA tenderness Lungs:  Clear with no wheezes, well healed PM incision. HEART:  Regular rate rhythm, no murmurs, no rubs, no clicks Abd:  soft, positive bowel sounds, no organomegally, no rebound, no guarding Ext:  2 plus pulses, no edema, no cyanosis, no clubbing Skin:  No rashes no nodules Neuro:  CN II through XII intact, motor grossly intact  ECG - NSR with biv pacing  DEVICE  Normal device function.  See PaceArt for details.   Assess/Plan: 1. Chronic systolic heart failure - her EF had normalized and her Biv pacemaker is working. 2. PPM - her Medtronic BiV PPM is working normally. We have reduced her output to maximize battery longevity. 3. HTN - her blood pressure is well controlled. Will continue her current meds. 4. Arthritis - she is reasonably well controlled despite her multiple disc issues.   Mikle Bosworth.D.

## 2017-04-16 NOTE — Patient Instructions (Signed)
Medication Instructions:  Your physician recommends that you continue on your current medications as directed. Please refer to the Current Medication list given to you today.   Labwork: None Ordered   Testing/Procedures: None Ordered   Follow-Up: Your physician wants you to follow-up in: 1 year with Dr. [name]. You will receive a reminder letter in the mail two months in advance. If you don't receive a letter, please call our office to schedule the follow-up appointment.  Remote monitoring is used to monitor your Pacemaker from home. This monitoring reduces the number of office visits required to check your device to one time per year. It allows [place] to keep an eye on the functioning of your device to ensure it is working properly. You are scheduled for a device check from home on 07/16/17. You may send your transmission at any time that day. If you have a wireless device, the transmission will be sent automatically. After your physician reviews your transmission, you will receive a postcard with your next transmission date.    Any Other Special Instructions Will Be Listed Below (If Applicable).     If you need a refill on your cardiac medications before your next appointment, please call your pharmacy.

## 2017-07-16 ENCOUNTER — Ambulatory Visit (INDEPENDENT_AMBULATORY_CARE_PROVIDER_SITE_OTHER): Payer: Medicare Other | Admitting: *Deleted

## 2017-07-16 ENCOUNTER — Telehealth: Payer: Self-pay | Admitting: Cardiology

## 2017-07-16 DIAGNOSIS — I5022 Chronic systolic (congestive) heart failure: Secondary | ICD-10-CM | POA: Diagnosis not present

## 2017-07-16 DIAGNOSIS — I429 Cardiomyopathy, unspecified: Secondary | ICD-10-CM

## 2017-07-16 NOTE — Telephone Encounter (Signed)
Spoke with pt and reminded pt of remote transmission that is due today. Pt verbalized understanding.   

## 2017-07-17 LAB — CUP PACEART REMOTE DEVICE CHECK
Battery Remaining Longevity: 67 mo
Battery Voltage: 3.01 V
Brady Statistic AP VP Percent: 6.2 %
Brady Statistic AS VP Percent: 93.59 %
Brady Statistic AS VS Percent: 0.19 %
Implantable Lead Implant Date: 20080612
Implantable Lead Implant Date: 20080612
Implantable Lead Location: 753859
Implantable Lead Model: 5076
Implantable Lead Model: 6947
Lead Channel Impedance Value: 304 Ohm
Lead Channel Impedance Value: 304 Ohm
Lead Channel Impedance Value: 342 Ohm
Lead Channel Impedance Value: 627 Ohm
Lead Channel Pacing Threshold Amplitude: 0.625 V
Lead Channel Pacing Threshold Amplitude: 1.5 V
Lead Channel Pacing Threshold Amplitude: 2.375 V
Lead Channel Pacing Threshold Pulse Width: 0.4 ms
Lead Channel Pacing Threshold Pulse Width: 0.4 ms
Lead Channel Sensing Intrinsic Amplitude: 14.125 mV
Lead Channel Setting Pacing Amplitude: 2 V
Lead Channel Setting Pacing Pulse Width: 0.4 ms
MDC IDC LEAD IMPLANT DT: 20080612
MDC IDC LEAD LOCATION: 753858
MDC IDC LEAD LOCATION: 753860
MDC IDC MSMT LEADCHNL LV IMPEDANCE VALUE: 380 Ohm
MDC IDC MSMT LEADCHNL LV IMPEDANCE VALUE: 475 Ohm
MDC IDC MSMT LEADCHNL LV IMPEDANCE VALUE: 532 Ohm
MDC IDC MSMT LEADCHNL LV IMPEDANCE VALUE: 760 Ohm
MDC IDC MSMT LEADCHNL RA PACING THRESHOLD PULSEWIDTH: 0.4 ms
MDC IDC MSMT LEADCHNL RA SENSING INTR AMPL: 2.375 mV
MDC IDC MSMT LEADCHNL RA SENSING INTR AMPL: 2.375 mV
MDC IDC MSMT LEADCHNL RV IMPEDANCE VALUE: 399 Ohm
MDC IDC MSMT LEADCHNL RV SENSING INTR AMPL: 14.125 mV
MDC IDC PG IMPLANT DT: 20170516
MDC IDC SESS DTM: 20180821211243
MDC IDC SET LEADCHNL RA PACING AMPLITUDE: 1.5 V
MDC IDC SET LEADCHNL RV PACING AMPLITUDE: 2.5 V
MDC IDC SET LEADCHNL RV PACING PULSEWIDTH: 0.8 ms
MDC IDC SET LEADCHNL RV SENSING SENSITIVITY: 2.8 mV
MDC IDC STAT BRADY AP VS PERCENT: 0.02 %
MDC IDC STAT BRADY RA PERCENT PACED: 6.2 %
MDC IDC STAT BRADY RV PERCENT PACED: 99.53 %

## 2017-07-17 NOTE — Progress Notes (Signed)
Remote pacemaker transmission.   

## 2017-07-26 ENCOUNTER — Encounter: Payer: Self-pay | Admitting: Cardiology

## 2017-08-30 DIAGNOSIS — Z23 Encounter for immunization: Secondary | ICD-10-CM | POA: Diagnosis not present

## 2017-09-09 ENCOUNTER — Other Ambulatory Visit: Payer: Self-pay | Admitting: Interventional Cardiology

## 2017-10-14 ENCOUNTER — Other Ambulatory Visit: Payer: Self-pay | Admitting: Internal Medicine

## 2017-10-14 DIAGNOSIS — I5022 Chronic systolic (congestive) heart failure: Secondary | ICD-10-CM

## 2017-10-15 ENCOUNTER — Telehealth: Payer: Self-pay | Admitting: Cardiology

## 2017-10-15 ENCOUNTER — Ambulatory Visit (INDEPENDENT_AMBULATORY_CARE_PROVIDER_SITE_OTHER): Payer: Medicare Other | Admitting: *Deleted

## 2017-10-15 DIAGNOSIS — I429 Cardiomyopathy, unspecified: Secondary | ICD-10-CM

## 2017-10-15 DIAGNOSIS — I5022 Chronic systolic (congestive) heart failure: Secondary | ICD-10-CM

## 2017-10-15 NOTE — Telephone Encounter (Signed)
Confirmed remote transmission w/ pt.   

## 2017-10-16 LAB — CUP PACEART REMOTE DEVICE CHECK
Battery Voltage: 3.01 V
Brady Statistic AP VS Percent: 0.03 %
Brady Statistic AS VP Percent: 92.99 %
Implantable Lead Implant Date: 20080612
Implantable Lead Implant Date: 20080612
Implantable Lead Location: 753860
Implantable Lead Model: 4194
Implantable Lead Model: 5076
Implantable Lead Model: 6947
Implantable Pulse Generator Implant Date: 20170516
Lead Channel Impedance Value: 266 Ohm
Lead Channel Impedance Value: 323 Ohm
Lead Channel Impedance Value: 437 Ohm
Lead Channel Impedance Value: 722 Ohm
Lead Channel Pacing Threshold Pulse Width: 0.4 ms
Lead Channel Pacing Threshold Pulse Width: 0.4 ms
Lead Channel Pacing Threshold Pulse Width: 0.4 ms
Lead Channel Sensing Intrinsic Amplitude: 14.125 mV
Lead Channel Sensing Intrinsic Amplitude: 14.125 mV
Lead Channel Sensing Intrinsic Amplitude: 2.375 mV
Lead Channel Setting Pacing Amplitude: 2 V
Lead Channel Setting Pacing Pulse Width: 0.8 ms
MDC IDC LEAD IMPLANT DT: 20080612
MDC IDC LEAD LOCATION: 753858
MDC IDC LEAD LOCATION: 753859
MDC IDC MSMT BATTERY REMAINING LONGEVITY: 65 mo
MDC IDC MSMT LEADCHNL LV IMPEDANCE VALUE: 342 Ohm
MDC IDC MSMT LEADCHNL LV IMPEDANCE VALUE: 513 Ohm
MDC IDC MSMT LEADCHNL LV IMPEDANCE VALUE: 608 Ohm
MDC IDC MSMT LEADCHNL LV PACING THRESHOLD AMPLITUDE: 1.5 V
MDC IDC MSMT LEADCHNL RA PACING THRESHOLD AMPLITUDE: 0.5 V
MDC IDC MSMT LEADCHNL RA SENSING INTR AMPL: 2.375 mV
MDC IDC MSMT LEADCHNL RV IMPEDANCE VALUE: 285 Ohm
MDC IDC MSMT LEADCHNL RV IMPEDANCE VALUE: 361 Ohm
MDC IDC MSMT LEADCHNL RV PACING THRESHOLD AMPLITUDE: 2.375 V
MDC IDC SESS DTM: 20181120232751
MDC IDC SET LEADCHNL LV PACING PULSEWIDTH: 0.4 ms
MDC IDC SET LEADCHNL RA PACING AMPLITUDE: 1.5 V
MDC IDC SET LEADCHNL RV PACING AMPLITUDE: 2.5 V
MDC IDC SET LEADCHNL RV SENSING SENSITIVITY: 2.8 mV
MDC IDC STAT BRADY AP VP PERCENT: 6.8 %
MDC IDC STAT BRADY AS VS PERCENT: 0.18 %
MDC IDC STAT BRADY RA PERCENT PACED: 6.81 %
MDC IDC STAT BRADY RV PERCENT PACED: 99.59 %

## 2017-10-16 NOTE — Progress Notes (Signed)
Remote pacemaker transmission.   

## 2017-10-24 ENCOUNTER — Encounter: Payer: Self-pay | Admitting: Cardiology

## 2018-01-02 DIAGNOSIS — E790 Hyperuricemia without signs of inflammatory arthritis and tophaceous disease: Secondary | ICD-10-CM | POA: Diagnosis not present

## 2018-01-02 DIAGNOSIS — M4850XA Collapsed vertebra, not elsewhere classified, site unspecified, initial encounter for fracture: Secondary | ICD-10-CM | POA: Diagnosis not present

## 2018-01-02 DIAGNOSIS — M1A9XX1 Chronic gout, unspecified, with tophus (tophi): Secondary | ICD-10-CM | POA: Diagnosis not present

## 2018-01-02 DIAGNOSIS — M8000XG Age-related osteoporosis with current pathological fracture, unspecified site, subsequent encounter for fracture with delayed healing: Secondary | ICD-10-CM | POA: Diagnosis not present

## 2018-01-02 DIAGNOSIS — M5136 Other intervertebral disc degeneration, lumbar region: Secondary | ICD-10-CM | POA: Diagnosis not present

## 2018-01-02 DIAGNOSIS — M353 Polymyalgia rheumatica: Secondary | ICD-10-CM | POA: Diagnosis not present

## 2018-01-02 DIAGNOSIS — Z6821 Body mass index (BMI) 21.0-21.9, adult: Secondary | ICD-10-CM | POA: Diagnosis not present

## 2018-01-08 DIAGNOSIS — M810 Age-related osteoporosis without current pathological fracture: Secondary | ICD-10-CM | POA: Diagnosis not present

## 2018-01-08 DIAGNOSIS — Z0000 Encounter for general adult medical examination without abnormal findings: Secondary | ICD-10-CM | POA: Diagnosis not present

## 2018-01-08 DIAGNOSIS — D485 Neoplasm of uncertain behavior of skin: Secondary | ICD-10-CM | POA: Diagnosis not present

## 2018-01-08 DIAGNOSIS — D509 Iron deficiency anemia, unspecified: Secondary | ICD-10-CM | POA: Diagnosis not present

## 2018-01-08 DIAGNOSIS — E782 Mixed hyperlipidemia: Secondary | ICD-10-CM | POA: Diagnosis not present

## 2018-01-08 DIAGNOSIS — E039 Hypothyroidism, unspecified: Secondary | ICD-10-CM | POA: Diagnosis not present

## 2018-01-08 DIAGNOSIS — I10 Essential (primary) hypertension: Secondary | ICD-10-CM | POA: Diagnosis not present

## 2018-01-08 DIAGNOSIS — N183 Chronic kidney disease, stage 3 (moderate): Secondary | ICD-10-CM | POA: Diagnosis not present

## 2018-01-14 ENCOUNTER — Ambulatory Visit (INDEPENDENT_AMBULATORY_CARE_PROVIDER_SITE_OTHER): Payer: Medicare Other | Admitting: *Deleted

## 2018-01-14 DIAGNOSIS — I429 Cardiomyopathy, unspecified: Secondary | ICD-10-CM

## 2018-01-14 DIAGNOSIS — I5022 Chronic systolic (congestive) heart failure: Secondary | ICD-10-CM

## 2018-01-14 NOTE — Progress Notes (Signed)
Remote pacemaker transmission.   

## 2018-01-16 ENCOUNTER — Encounter: Payer: Self-pay | Admitting: Cardiology

## 2018-01-31 DIAGNOSIS — M4850XA Collapsed vertebra, not elsewhere classified, site unspecified, initial encounter for fracture: Secondary | ICD-10-CM | POA: Diagnosis not present

## 2018-01-31 DIAGNOSIS — M5136 Other intervertebral disc degeneration, lumbar region: Secondary | ICD-10-CM | POA: Diagnosis not present

## 2018-01-31 DIAGNOSIS — M8000XG Age-related osteoporosis with current pathological fracture, unspecified site, subsequent encounter for fracture with delayed healing: Secondary | ICD-10-CM | POA: Diagnosis not present

## 2018-01-31 DIAGNOSIS — E790 Hyperuricemia without signs of inflammatory arthritis and tophaceous disease: Secondary | ICD-10-CM | POA: Diagnosis not present

## 2018-01-31 DIAGNOSIS — M353 Polymyalgia rheumatica: Secondary | ICD-10-CM | POA: Diagnosis not present

## 2018-01-31 DIAGNOSIS — Z6822 Body mass index (BMI) 22.0-22.9, adult: Secondary | ICD-10-CM | POA: Diagnosis not present

## 2018-01-31 DIAGNOSIS — M1A9XX1 Chronic gout, unspecified, with tophus (tophi): Secondary | ICD-10-CM | POA: Diagnosis not present

## 2018-03-03 DIAGNOSIS — M1A9XX1 Chronic gout, unspecified, with tophus (tophi): Secondary | ICD-10-CM | POA: Diagnosis not present

## 2018-04-02 DIAGNOSIS — R609 Edema, unspecified: Secondary | ICD-10-CM | POA: Diagnosis not present

## 2018-04-02 DIAGNOSIS — Z6823 Body mass index (BMI) 23.0-23.9, adult: Secondary | ICD-10-CM | POA: Diagnosis not present

## 2018-04-02 DIAGNOSIS — M8000XG Age-related osteoporosis with current pathological fracture, unspecified site, subsequent encounter for fracture with delayed healing: Secondary | ICD-10-CM | POA: Diagnosis not present

## 2018-04-02 DIAGNOSIS — E790 Hyperuricemia without signs of inflammatory arthritis and tophaceous disease: Secondary | ICD-10-CM | POA: Diagnosis not present

## 2018-04-02 DIAGNOSIS — M1A9XX1 Chronic gout, unspecified, with tophus (tophi): Secondary | ICD-10-CM | POA: Diagnosis not present

## 2018-04-02 DIAGNOSIS — M353 Polymyalgia rheumatica: Secondary | ICD-10-CM | POA: Diagnosis not present

## 2018-04-02 DIAGNOSIS — M4850XA Collapsed vertebra, not elsewhere classified, site unspecified, initial encounter for fracture: Secondary | ICD-10-CM | POA: Diagnosis not present

## 2018-04-02 DIAGNOSIS — M5136 Other intervertebral disc degeneration, lumbar region: Secondary | ICD-10-CM | POA: Diagnosis not present

## 2018-04-15 ENCOUNTER — Ambulatory Visit (INDEPENDENT_AMBULATORY_CARE_PROVIDER_SITE_OTHER): Payer: Medicare Other | Admitting: *Deleted

## 2018-04-15 ENCOUNTER — Telehealth: Payer: Self-pay | Admitting: Cardiology

## 2018-04-15 DIAGNOSIS — I429 Cardiomyopathy, unspecified: Secondary | ICD-10-CM

## 2018-04-15 DIAGNOSIS — I5022 Chronic systolic (congestive) heart failure: Secondary | ICD-10-CM | POA: Diagnosis not present

## 2018-04-15 NOTE — Telephone Encounter (Signed)
Spoke with pt and reminded pt of remote transmission that is due today. Pt verbalized understanding.   

## 2018-04-16 ENCOUNTER — Encounter: Payer: Self-pay | Admitting: Cardiology

## 2018-04-16 NOTE — Progress Notes (Signed)
Remote pacemaker transmission.   

## 2018-04-25 LAB — CUP PACEART REMOTE DEVICE CHECK
Battery Remaining Longevity: 62 mo
Battery Voltage: 3.01 V
Brady Statistic AP VS Percent: 0.01 %
Brady Statistic RA Percent Paced: 7.88 %
Date Time Interrogation Session: 20190521232002
Implantable Lead Implant Date: 20080612
Implantable Lead Location: 753858
Implantable Lead Location: 753860
Implantable Lead Model: 4194
Implantable Lead Model: 6947
Implantable Pulse Generator Implant Date: 20170516
Lead Channel Impedance Value: 285 Ohm
Lead Channel Impedance Value: 361 Ohm
Lead Channel Impedance Value: 741 Ohm
Lead Channel Pacing Threshold Amplitude: 2.375 V
Lead Channel Pacing Threshold Pulse Width: 0.4 ms
Lead Channel Sensing Intrinsic Amplitude: 13.25 mV
Lead Channel Sensing Intrinsic Amplitude: 2.375 mV
Lead Channel Sensing Intrinsic Amplitude: 2.375 mV
Lead Channel Setting Pacing Amplitude: 1.5 V
Lead Channel Setting Sensing Sensitivity: 2.8 mV
MDC IDC LEAD IMPLANT DT: 20080612
MDC IDC LEAD IMPLANT DT: 20080612
MDC IDC LEAD LOCATION: 753859
MDC IDC MSMT LEADCHNL LV IMPEDANCE VALUE: 456 Ohm
MDC IDC MSMT LEADCHNL LV IMPEDANCE VALUE: 532 Ohm
MDC IDC MSMT LEADCHNL LV IMPEDANCE VALUE: 627 Ohm
MDC IDC MSMT LEADCHNL LV PACING THRESHOLD AMPLITUDE: 1.5 V
MDC IDC MSMT LEADCHNL LV PACING THRESHOLD PULSEWIDTH: 0.4 ms
MDC IDC MSMT LEADCHNL RA IMPEDANCE VALUE: 342 Ohm
MDC IDC MSMT LEADCHNL RA PACING THRESHOLD AMPLITUDE: 0.75 V
MDC IDC MSMT LEADCHNL RA PACING THRESHOLD PULSEWIDTH: 0.4 ms
MDC IDC MSMT LEADCHNL RV IMPEDANCE VALUE: 266 Ohm
MDC IDC MSMT LEADCHNL RV IMPEDANCE VALUE: 361 Ohm
MDC IDC MSMT LEADCHNL RV SENSING INTR AMPL: 13.25 mV
MDC IDC SET LEADCHNL LV PACING AMPLITUDE: 2 V
MDC IDC SET LEADCHNL LV PACING PULSEWIDTH: 0.4 ms
MDC IDC SET LEADCHNL RV PACING AMPLITUDE: 2.5 V
MDC IDC SET LEADCHNL RV PACING PULSEWIDTH: 0.8 ms
MDC IDC STAT BRADY AP VP PERCENT: 7.89 %
MDC IDC STAT BRADY AS VP PERCENT: 91.98 %
MDC IDC STAT BRADY AS VS PERCENT: 0.13 %
MDC IDC STAT BRADY RV PERCENT PACED: 99.62 %

## 2018-05-01 ENCOUNTER — Encounter: Payer: Self-pay | Admitting: Internal Medicine

## 2018-05-01 ENCOUNTER — Ambulatory Visit (INDEPENDENT_AMBULATORY_CARE_PROVIDER_SITE_OTHER): Payer: Medicare Other | Admitting: Internal Medicine

## 2018-05-01 VITALS — BP 120/70 | HR 76 | Ht <= 58 in | Wt 102.0 lb

## 2018-05-01 DIAGNOSIS — Z95810 Presence of automatic (implantable) cardiac defibrillator: Secondary | ICD-10-CM | POA: Diagnosis not present

## 2018-05-01 DIAGNOSIS — I429 Cardiomyopathy, unspecified: Secondary | ICD-10-CM

## 2018-05-01 DIAGNOSIS — I5022 Chronic systolic (congestive) heart failure: Secondary | ICD-10-CM | POA: Diagnosis not present

## 2018-05-01 LAB — CUP PACEART INCLINIC DEVICE CHECK
Battery Remaining Longevity: 62 mo
Brady Statistic AP VS Percent: 0.02 %
Brady Statistic AS VP Percent: 93.49 %
Brady Statistic AS VS Percent: 0.22 %
Brady Statistic RV Percent Paced: 99.47 %
Date Time Interrogation Session: 20190606144712
Implantable Lead Implant Date: 20080612
Implantable Lead Implant Date: 20080612
Implantable Lead Location: 753858
Implantable Lead Location: 753859
Implantable Lead Location: 753860
Implantable Lead Model: 6947
Implantable Pulse Generator Implant Date: 20170516
Lead Channel Impedance Value: 266 Ohm
Lead Channel Impedance Value: 323 Ohm
Lead Channel Impedance Value: 342 Ohm
Lead Channel Impedance Value: 418 Ohm
Lead Channel Impedance Value: 494 Ohm
Lead Channel Pacing Threshold Amplitude: 1.5 V
Lead Channel Pacing Threshold Pulse Width: 0.4 ms
Lead Channel Pacing Threshold Pulse Width: 0.4 ms
Lead Channel Sensing Intrinsic Amplitude: 12.875 mV
Lead Channel Sensing Intrinsic Amplitude: 2.25 mV
Lead Channel Sensing Intrinsic Amplitude: 2.375 mV
Lead Channel Setting Pacing Amplitude: 2.5 V
Lead Channel Setting Pacing Pulse Width: 0.4 ms
MDC IDC LEAD IMPLANT DT: 20080612
MDC IDC MSMT BATTERY VOLTAGE: 3.01 V
MDC IDC MSMT LEADCHNL LV IMPEDANCE VALUE: 570 Ohm
MDC IDC MSMT LEADCHNL LV IMPEDANCE VALUE: 684 Ohm
MDC IDC MSMT LEADCHNL LV PACING THRESHOLD PULSEWIDTH: 0.4 ms
MDC IDC MSMT LEADCHNL RA IMPEDANCE VALUE: 266 Ohm
MDC IDC MSMT LEADCHNL RA IMPEDANCE VALUE: 323 Ohm
MDC IDC MSMT LEADCHNL RA PACING THRESHOLD AMPLITUDE: 0.625 V
MDC IDC MSMT LEADCHNL RV PACING THRESHOLD AMPLITUDE: 2.375 V
MDC IDC MSMT LEADCHNL RV SENSING INTR AMPL: 14.375 mV
MDC IDC SET LEADCHNL LV PACING AMPLITUDE: 2 V
MDC IDC SET LEADCHNL RA PACING AMPLITUDE: 1.5 V
MDC IDC SET LEADCHNL RV PACING PULSEWIDTH: 0.8 ms
MDC IDC SET LEADCHNL RV SENSING SENSITIVITY: 2.8 mV
MDC IDC STAT BRADY AP VP PERCENT: 6.27 %
MDC IDC STAT BRADY RA PERCENT PACED: 6.27 %

## 2018-05-01 NOTE — Patient Instructions (Signed)
Medication Instructions:  Your physician recommends that you continue on your current medications as directed. Please refer to the Current Medication list given to you today.  Labwork: None ordered.  Testing/Procedures: None ordered.  Follow-Up: Your physician wants you to follow-up in: one year with Dr. [name].   You will receive a reminder letter in the mail two months in advance. If you don't receive a letter, please call our office to schedule the follow-up appointment.  Remote monitoring is used to monitor your Pacemaker from home. This monitoring reduces the number of office visits required to check your device to one time per year. It allows [place] to keep an eye on the functioning of your device to ensure it is working properly. You are scheduled for a device check from home on 07/15/2018. You may send your transmission at any time that day. If you have a wireless device, the transmission will be sent automatically. After your physician reviews your transmission, you will receive a postcard with your next transmission date.  Any Other Special Instructions Will Be Listed Below (If Applicable).  If you need a refill on your cardiac medications before your next appointment, please call your pharmacy.

## 2018-05-01 NOTE — Progress Notes (Signed)
HPI [name] returns today for ongoing biv ppm followup. She is a pleasant 84 yo woman with a longstanding h/o chronic systolic heart failure, LBBB, HTN and severe arthritis, s/p BiV PM implant. She has had normalization of her LV function with Biv pacing. In the interim, she has been very stable from a CHF perspective. Her biggest problem has been related to arthritis and bone loss with multiple vertebral compression fractures. She has been on chronic steroid therapy for PMR.  Her activity has gone down some but she still tries to get out and work in her yard.   Allergies  Allergen Reactions  . Pertussis Vaccines Swelling  . Sulfa Antibiotics Nausea Only  . Amoxicillin Rash and Other (See Comments)    Has patient had a PCN reaction causing immediate rash, facial/tongue/throat swelling, SOB or lightheadedness with hypotension: yes Has patient had a PCN reaction causing severe rash involving mucus membranes or skin necrosis: no Has patient had a PCN reaction that required hospitalization no Has patient had a PCN reaction occurring within the last 10 years: yes If all of the above answers are "NO", then may proceed with Cephalosporin use.      Current Outpatient Medications  Medication Sig Dispense Refill  . aspirin EC 81 MG tablet Take 81 mg by mouth daily.    Marland Kitchen atorvastatin (LIPITOR) 40 MG tablet Take 40 mg by mouth daily.    . Calcium Carbonate (CALTRATE 600 PO) Take 600 mg by mouth 2 (two) times daily.     . Cholecalciferol (VITAMIN D3) 1000 UNITS CAPS Take 1,000 Units by mouth daily.     Marland Kitchen COLCRYS 0.6 MG tablet Take 0.6 mg by mouth 2 (two) times daily as needed (For gout flare-ups.).   0  . furosemide (LASIX) 40 MG tablet TAKE 1 TABLET EVERY MORNING  AND TAKE 1/2 TABLET IN THE EVENING 135 tablet 3  . HYDROcodone-acetaminophen (NORCO/VICODIN) 5-325 MG per tablet Take 1 tablet by mouth every 6 (six) hours as needed (For pain.).   0  . levothyroxine (SYNTHROID, LEVOTHROID) 75  MCG tablet Take 1 tablet (75 mcg total) by mouth daily. 90 tablet 1  . lisinopril (PRINIVIL,ZESTRIL) 5 MG tablet Take 5 mg by mouth daily.    . methylPREDNISolone (MEDROL) 4 MG tablet Take 2 mg by mouth daily.    . metoprolol succinate (TOPROL-XL) 100 MG 24 hr tablet Take 100 mg by mouth daily. Take with or immediately following a meal.    . Multiple Vitamin (MULTIVITAMIN PO) Take 1 tablet by mouth daily.     Marland Kitchen omeprazole (PRILOSEC OTC) 20 MG tablet Take 20 mg by mouth 2 (two) times daily.     . potassium chloride SA (KLOR-CON M20) 20 MEQ tablet Take one (1) tablet by mouth every morning and half (1/2) tablet by mouth every evening. 135 tablet 2   No current facility-administered medications for this visit.      Past Medical History:  Diagnosis Date  . Allergic rhinitis   . Anemia    iron deficient  . Aneurysm ([place])   . Anxiety   . Basal cell carcinoma   . Chronic anemia   . Chronic low back pain   . Colon polyps   . GERD (gastroesophageal reflux disease)   . Hiatal hernia   . HTN (hypertension)   . Hypercholesteremia   . Hyperlipidemia   . Hypothyroidism   . Insomnia   . Left heart failure ([place])   . Osteoporosis  S/P forteo treatment as of 02/2009  . Pacemaker   . Polymyalgia ([place])    rheumatica-steroids per Rheum  . Systolic heart failure     ROS:   All systems reviewed and negative except as noted in the HPI.   Past Surgical History:  Procedure Laterality Date  . ABDOMINAL HYSTERECTOMY    . ANGIOPLASTY    . BACK SURGERY    . CARDIAC CATHETERIZATION  10/16/06  . CATARACT EXTRACTION    . defibulator implant    . EP IMPLANTABLE DEVICE N/A 04/10/2016   Procedure: BiV Pacemaker Insertion CRT-P;  Surgeon: Evans Lance, MD;  Location: [facility];  Service: Cardiovascular;  Laterality: N/A;  . FOOT SURGERY    . MITRAL VALVE REPAIR    . skin cancer removal    . THYROIDECTOMY    . TONSILLECTOMY       Family History  Problem Relation Age of Onset  .  Stroke Mother        in her 82's  . Cancer Father 75       secondary  . Colon cancer Brother   . Colon cancer Sister   . Breast cancer Sister      Social History   Socioeconomic History  . Marital status: Widowed    Spouse name: Not on file  . Number of children: Not on file  . Years of education: Not on file  . Highest education level: Not on file  Occupational History  . Not on file  Social Needs  . Financial resource strain: Not on file  . Food insecurity:    Worry: Not on file    Inability: Not on file  . Transportation needs:    Medical: Not on file    Non-medical: Not on file  Tobacco Use  . Smoking status: Never Smoker  . Smokeless tobacco: Never Used  Substance and Sexual Activity  . Alcohol use: No  . Drug use: No  . Sexual activity: Not on file  Lifestyle  . Physical activity:    Days per week: Not on file    Minutes per session: Not on file  . Stress: Not on file  Relationships  . Social connections:    Talks on phone: Not on file    Gets together: Not on file    Attends religious service: Not on file    Active member of club or organization: Not on file    Attends meetings of clubs or organizations: Not on file    Relationship status: Not on file  . Intimate partner violence:    Fear of current or ex partner: Not on file    Emotionally abused: Not on file    Physically abused: Not on file    Forced sexual activity: Not on file  Other Topics Concern  . Not on file  Social History Narrative  . Not on file     BP 120/70   Pulse 76   Ht 4' 8.5" (1.435 m)   Wt 102 lb (46.3 kg)   BMI 22.46 kg/m   Physical Exam:  Chronically ill appearing elderly woman, NAD HEENT: Unremarkable Neck:  6 cm JVD, no thyromegally Lymphatics:  No adenopathy Back:  No CVA tenderness Lungs:  Clear with no wheezes HEART:  Regular rate rhythm, no murmurs, no rubs, no clicks Abd:  soft, positive bowel sounds, no organomegally, no rebound, no guarding Ext:  2  plus pulses, no edema, no cyanosis, no clubbing Skin:  No rashes no nodules Neuro:  CN II through XII intact, motor grossly intact  EKG - NSR with AV pacing  DEVICE  Normal device function.  See PaceArt for details.   Assess/Plan: 1. CHB - she is asymptomatic, s/p biv ppm 2. Chronic systolic heart failure - she has had normalization of her LV function. She will continue her current meds. 3. PPM - interogation of her medtronic biv PPM demonstrates normal device function. We will follow.  Mikle Bosworth.D.

## 2018-07-07 DIAGNOSIS — E790 Hyperuricemia without signs of inflammatory arthritis and tophaceous disease: Secondary | ICD-10-CM | POA: Diagnosis not present

## 2018-07-07 DIAGNOSIS — M8000XG Age-related osteoporosis with current pathological fracture, unspecified site, subsequent encounter for fracture with delayed healing: Secondary | ICD-10-CM | POA: Diagnosis not present

## 2018-07-07 DIAGNOSIS — M353 Polymyalgia rheumatica: Secondary | ICD-10-CM | POA: Diagnosis not present

## 2018-07-07 DIAGNOSIS — R609 Edema, unspecified: Secondary | ICD-10-CM | POA: Diagnosis not present

## 2018-07-07 DIAGNOSIS — D649 Anemia, unspecified: Secondary | ICD-10-CM | POA: Diagnosis not present

## 2018-07-07 DIAGNOSIS — D539 Nutritional anemia, unspecified: Secondary | ICD-10-CM | POA: Diagnosis not present

## 2018-07-07 DIAGNOSIS — M5136 Other intervertebral disc degeneration, lumbar region: Secondary | ICD-10-CM | POA: Diagnosis not present

## 2018-07-07 DIAGNOSIS — Z6822 Body mass index (BMI) 22.0-22.9, adult: Secondary | ICD-10-CM | POA: Diagnosis not present

## 2018-07-07 DIAGNOSIS — M4850XA Collapsed vertebra, not elsewhere classified, site unspecified, initial encounter for fracture: Secondary | ICD-10-CM | POA: Diagnosis not present

## 2018-07-07 DIAGNOSIS — M1A9XX1 Chronic gout, unspecified, with tophus (tophi): Secondary | ICD-10-CM | POA: Diagnosis not present

## 2018-07-09 DIAGNOSIS — M8000XG Age-related osteoporosis with current pathological fracture, unspecified site, subsequent encounter for fracture with delayed healing: Secondary | ICD-10-CM | POA: Diagnosis not present

## 2018-07-15 ENCOUNTER — Telehealth: Payer: Self-pay | Admitting: Cardiology

## 2018-07-15 ENCOUNTER — Ambulatory Visit (INDEPENDENT_AMBULATORY_CARE_PROVIDER_SITE_OTHER): Payer: Medicare Other | Admitting: *Deleted

## 2018-07-15 DIAGNOSIS — I5022 Chronic systolic (congestive) heart failure: Secondary | ICD-10-CM

## 2018-07-15 DIAGNOSIS — I429 Cardiomyopathy, unspecified: Secondary | ICD-10-CM

## 2018-07-15 NOTE — Telephone Encounter (Signed)
Spoke with pt and reminded pt of remote transmission that is due today. Pt verbalized understanding.   

## 2018-07-15 NOTE — Progress Notes (Signed)
Remote pacemaker transmission.   

## 2018-08-18 LAB — CUP PACEART REMOTE DEVICE CHECK
Brady Statistic AP VS Percent: 0.01 %
Brady Statistic AS VS Percent: 0.09 %
Date Time Interrogation Session: 20190820232102
Implantable Lead Implant Date: 20080612
Implantable Lead Implant Date: 20080612
Implantable Lead Location: 753858
Implantable Lead Location: 753860
Implantable Lead Model: 6947
Lead Channel Impedance Value: 266 Ohm
Lead Channel Impedance Value: 342 Ohm
Lead Channel Impedance Value: 361 Ohm
Lead Channel Impedance Value: 513 Ohm
Lead Channel Impedance Value: 722 Ohm
Lead Channel Pacing Threshold Amplitude: 1.5 V
Lead Channel Pacing Threshold Amplitude: 2.375 V
Lead Channel Pacing Threshold Pulse Width: 0.4 ms
Lead Channel Sensing Intrinsic Amplitude: 13 mV
Lead Channel Sensing Intrinsic Amplitude: 2.375 mV
Lead Channel Setting Pacing Amplitude: 1.5 V
Lead Channel Setting Pacing Amplitude: 2 V
Lead Channel Setting Pacing Amplitude: 2.5 V
MDC IDC LEAD IMPLANT DT: 20080612
MDC IDC LEAD LOCATION: 753859
MDC IDC MSMT BATTERY REMAINING LONGEVITY: 57 mo
MDC IDC MSMT BATTERY VOLTAGE: 3 V
MDC IDC MSMT LEADCHNL LV IMPEDANCE VALUE: 342 Ohm
MDC IDC MSMT LEADCHNL LV IMPEDANCE VALUE: 437 Ohm
MDC IDC MSMT LEADCHNL LV IMPEDANCE VALUE: 608 Ohm
MDC IDC MSMT LEADCHNL RA IMPEDANCE VALUE: 285 Ohm
MDC IDC MSMT LEADCHNL RA PACING THRESHOLD AMPLITUDE: 0.5 V
MDC IDC MSMT LEADCHNL RA PACING THRESHOLD PULSEWIDTH: 0.4 ms
MDC IDC MSMT LEADCHNL RA SENSING INTR AMPL: 2.375 mV
MDC IDC MSMT LEADCHNL RV PACING THRESHOLD PULSEWIDTH: 0.4 ms
MDC IDC MSMT LEADCHNL RV SENSING INTR AMPL: 13 mV
MDC IDC PG IMPLANT DT: 20170516
MDC IDC SET LEADCHNL LV PACING PULSEWIDTH: 0.4 ms
MDC IDC SET LEADCHNL RV PACING PULSEWIDTH: 0.8 ms
MDC IDC SET LEADCHNL RV SENSING SENSITIVITY: 2.8 mV
MDC IDC STAT BRADY AP VP PERCENT: 5.51 %
MDC IDC STAT BRADY AS VP PERCENT: 94.39 %
MDC IDC STAT BRADY RA PERCENT PACED: 5.51 %
MDC IDC STAT BRADY RV PERCENT PACED: 99.8 %

## 2018-09-09 DIAGNOSIS — Z23 Encounter for immunization: Secondary | ICD-10-CM | POA: Diagnosis not present

## 2018-10-14 ENCOUNTER — Ambulatory Visit (INDEPENDENT_AMBULATORY_CARE_PROVIDER_SITE_OTHER): Payer: Medicare Other

## 2018-10-14 ENCOUNTER — Telehealth: Payer: Self-pay

## 2018-10-14 DIAGNOSIS — I5022 Chronic systolic (congestive) heart failure: Secondary | ICD-10-CM

## 2018-10-14 DIAGNOSIS — I429 Cardiomyopathy, unspecified: Secondary | ICD-10-CM

## 2018-10-14 NOTE — Progress Notes (Signed)
Remote pacemaker transmission.   

## 2018-10-14 NOTE — Telephone Encounter (Signed)
Spoke with pt and reminded pt of remote transmission that is due today. Pt verbalized understanding.   

## 2018-10-21 DIAGNOSIS — D485 Neoplasm of uncertain behavior of skin: Secondary | ICD-10-CM | POA: Diagnosis not present

## 2018-10-21 DIAGNOSIS — C4401 Basal cell carcinoma of skin of lip: Secondary | ICD-10-CM | POA: Diagnosis not present

## 2018-11-06 DIAGNOSIS — R609 Edema, unspecified: Secondary | ICD-10-CM | POA: Diagnosis not present

## 2018-11-06 DIAGNOSIS — M4850XA Collapsed vertebra, not elsewhere classified, site unspecified, initial encounter for fracture: Secondary | ICD-10-CM | POA: Diagnosis not present

## 2018-11-06 DIAGNOSIS — M353 Polymyalgia rheumatica: Secondary | ICD-10-CM | POA: Diagnosis not present

## 2018-11-06 DIAGNOSIS — M5136 Other intervertebral disc degeneration, lumbar region: Secondary | ICD-10-CM | POA: Diagnosis not present

## 2018-11-06 DIAGNOSIS — E790 Hyperuricemia without signs of inflammatory arthritis and tophaceous disease: Secondary | ICD-10-CM | POA: Diagnosis not present

## 2018-11-06 DIAGNOSIS — Z6822 Body mass index (BMI) 22.0-22.9, adult: Secondary | ICD-10-CM | POA: Diagnosis not present

## 2018-11-06 DIAGNOSIS — M1A9XX1 Chronic gout, unspecified, with tophus (tophi): Secondary | ICD-10-CM | POA: Diagnosis not present

## 2018-11-06 DIAGNOSIS — M8000XG Age-related osteoporosis with current pathological fracture, unspecified site, subsequent encounter for fracture with delayed healing: Secondary | ICD-10-CM | POA: Diagnosis not present

## 2018-12-09 LAB — CUP PACEART REMOTE DEVICE CHECK
Battery Remaining Longevity: 55 mo
Battery Voltage: 3 V
Brady Statistic AP VP Percent: 4.33 %
Brady Statistic AP VS Percent: 0 %
Brady Statistic AS VP Percent: 95.57 %
Brady Statistic AS VS Percent: 0.09 %
Brady Statistic RA Percent Paced: 4.34 %
Brady Statistic RV Percent Paced: 99.8 %
Date Time Interrogation Session: 20191119220832
Implantable Lead Implant Date: 20080612
Implantable Lead Implant Date: 20080612
Implantable Lead Implant Date: 20080612
Implantable Lead Location: 753858
Implantable Lead Location: 753859
Implantable Lead Location: 753860
Implantable Lead Model: 4194
Implantable Lead Model: 5076
Implantable Lead Model: 6947
Implantable Pulse Generator Implant Date: 20170516
Lead Channel Impedance Value: 266 Ohm
Lead Channel Impedance Value: 285 Ohm
Lead Channel Impedance Value: 323 Ohm
Lead Channel Impedance Value: 342 Ohm
Lead Channel Impedance Value: 342 Ohm
Lead Channel Impedance Value: 456 Ohm
Lead Channel Impedance Value: 513 Ohm
Lead Channel Impedance Value: 627 Ohm
Lead Channel Impedance Value: 741 Ohm
Lead Channel Pacing Threshold Amplitude: 0.5 V
Lead Channel Pacing Threshold Amplitude: 1.5 V
Lead Channel Pacing Threshold Amplitude: 2.375 V
Lead Channel Pacing Threshold Pulse Width: 0.4 ms
Lead Channel Pacing Threshold Pulse Width: 0.4 ms
Lead Channel Pacing Threshold Pulse Width: 0.4 ms
Lead Channel Sensing Intrinsic Amplitude: 13.75 mV
Lead Channel Sensing Intrinsic Amplitude: 13.75 mV
Lead Channel Sensing Intrinsic Amplitude: 2.5 mV
Lead Channel Sensing Intrinsic Amplitude: 2.5 mV
Lead Channel Setting Pacing Amplitude: 1.5 V
Lead Channel Setting Pacing Amplitude: 2 V
Lead Channel Setting Pacing Amplitude: 2.5 V
Lead Channel Setting Pacing Pulse Width: 0.4 ms
Lead Channel Setting Pacing Pulse Width: 0.8 ms
Lead Channel Setting Sensing Sensitivity: 2.8 mV

## 2018-12-24 DIAGNOSIS — C4401 Basal cell carcinoma of skin of lip: Secondary | ICD-10-CM | POA: Diagnosis not present

## 2019-01-13 ENCOUNTER — Ambulatory Visit (INDEPENDENT_AMBULATORY_CARE_PROVIDER_SITE_OTHER): Payer: Medicare Other

## 2019-01-13 DIAGNOSIS — I429 Cardiomyopathy, unspecified: Secondary | ICD-10-CM | POA: Diagnosis not present

## 2019-01-13 DIAGNOSIS — I5022 Chronic systolic (congestive) heart failure: Secondary | ICD-10-CM

## 2019-01-14 ENCOUNTER — Telehealth: Payer: Self-pay

## 2019-01-14 LAB — CUP PACEART REMOTE DEVICE CHECK
Battery Remaining Longevity: 56 mo
Battery Voltage: 3 V
Brady Statistic AP VP Percent: 4.47 %
Brady Statistic AP VS Percent: 0.01 %
Brady Statistic AS VP Percent: 95.34 %
Brady Statistic RA Percent Paced: 4.46 %
Implantable Lead Implant Date: 20080612
Implantable Lead Implant Date: 20080612
Implantable Lead Location: 753858
Implantable Lead Location: 753859
Implantable Lead Location: 753860
Implantable Lead Model: 5076
Implantable Lead Model: 6947
Implantable Pulse Generator Implant Date: 20170516
Lead Channel Impedance Value: 285 Ohm
Lead Channel Impedance Value: 304 Ohm
Lead Channel Impedance Value: 342 Ohm
Lead Channel Impedance Value: 361 Ohm
Lead Channel Impedance Value: 361 Ohm
Lead Channel Impedance Value: 532 Ohm
Lead Channel Pacing Threshold Amplitude: 1.5 V
Lead Channel Pacing Threshold Amplitude: 2.375 V
Lead Channel Pacing Threshold Pulse Width: 0.4 ms
Lead Channel Pacing Threshold Pulse Width: 0.4 ms
Lead Channel Sensing Intrinsic Amplitude: 12.875 mV
Lead Channel Sensing Intrinsic Amplitude: 2.875 mV
Lead Channel Sensing Intrinsic Amplitude: 2.875 mV
Lead Channel Setting Pacing Pulse Width: 0.4 ms
Lead Channel Setting Sensing Sensitivity: 2.8 mV
MDC IDC LEAD IMPLANT DT: 20080612
MDC IDC MSMT LEADCHNL LV IMPEDANCE VALUE: 475 Ohm
MDC IDC MSMT LEADCHNL LV IMPEDANCE VALUE: 646 Ohm
MDC IDC MSMT LEADCHNL LV IMPEDANCE VALUE: 741 Ohm
MDC IDC MSMT LEADCHNL RA PACING THRESHOLD AMPLITUDE: 0.625 V
MDC IDC MSMT LEADCHNL RA PACING THRESHOLD PULSEWIDTH: 0.4 ms
MDC IDC MSMT LEADCHNL RV SENSING INTR AMPL: 12.875 mV
MDC IDC SESS DTM: 20200219201635
MDC IDC SET LEADCHNL LV PACING AMPLITUDE: 2 V
MDC IDC SET LEADCHNL RA PACING AMPLITUDE: 1.5 V
MDC IDC SET LEADCHNL RV PACING AMPLITUDE: 2.5 V
MDC IDC SET LEADCHNL RV PACING PULSEWIDTH: 0.8 ms
MDC IDC STAT BRADY AS VS PERCENT: 0.18 %
MDC IDC STAT BRADY RV PERCENT PACED: 99.37 %

## 2019-01-14 NOTE — Telephone Encounter (Signed)
Left message for patient to remind of missed remote transmission.  

## 2019-01-15 DIAGNOSIS — M8000XG Age-related osteoporosis with current pathological fracture, unspecified site, subsequent encounter for fracture with delayed healing: Secondary | ICD-10-CM | POA: Diagnosis not present

## 2019-01-21 NOTE — Progress Notes (Signed)
Remote pacemaker transmission.   

## 2019-01-26 ENCOUNTER — Encounter: Payer: Self-pay | Admitting: Cardiology

## 2019-03-27 DIAGNOSIS — I10 Essential (primary) hypertension: Secondary | ICD-10-CM | POA: Diagnosis not present

## 2019-03-27 DIAGNOSIS — E039 Hypothyroidism, unspecified: Secondary | ICD-10-CM | POA: Diagnosis not present

## 2019-03-27 DIAGNOSIS — K219 Gastro-esophageal reflux disease without esophagitis: Secondary | ICD-10-CM | POA: Diagnosis not present

## 2019-03-27 DIAGNOSIS — D509 Iron deficiency anemia, unspecified: Secondary | ICD-10-CM | POA: Diagnosis not present

## 2019-03-27 DIAGNOSIS — N183 Chronic kidney disease, stage 3 (moderate): Secondary | ICD-10-CM | POA: Diagnosis not present

## 2019-03-27 DIAGNOSIS — R5381 Other malaise: Secondary | ICD-10-CM | POA: Diagnosis not present

## 2019-03-27 DIAGNOSIS — M353 Polymyalgia rheumatica: Secondary | ICD-10-CM | POA: Diagnosis not present

## 2019-03-27 DIAGNOSIS — E782 Mixed hyperlipidemia: Secondary | ICD-10-CM | POA: Diagnosis not present

## 2019-03-28 ENCOUNTER — Encounter (HOSPITAL_BASED_OUTPATIENT_CLINIC_OR_DEPARTMENT_OTHER): Payer: Self-pay

## 2019-03-28 ENCOUNTER — Inpatient Hospital Stay (HOSPITAL_BASED_OUTPATIENT_CLINIC_OR_DEPARTMENT_OTHER)
Admission: EM | Admit: 2019-03-28 | Discharge: 2019-03-31 | DRG: 683 | Disposition: A | Discharge status: Home / Self Care | Payer: Medicare Other | Attending: Internal Medicine | Admitting: Internal Medicine

## 2019-03-28 ENCOUNTER — Other Ambulatory Visit: Payer: Self-pay

## 2019-03-28 DIAGNOSIS — M545 Low back pain: Secondary | ICD-10-CM | POA: Diagnosis present

## 2019-03-28 DIAGNOSIS — D509 Iron deficiency anemia, unspecified: Secondary | ICD-10-CM | POA: Diagnosis present

## 2019-03-28 DIAGNOSIS — E785 Hyperlipidemia, unspecified: Secondary | ICD-10-CM | POA: Diagnosis present

## 2019-03-28 DIAGNOSIS — I130 Hypertensive heart and chronic kidney disease with heart failure and stage 1 through stage 4 chronic kidney disease, or unspecified chronic kidney disease: Secondary | ICD-10-CM | POA: Diagnosis present

## 2019-03-28 DIAGNOSIS — I5032 Chronic diastolic (congestive) heart failure: Secondary | ICD-10-CM | POA: Diagnosis not present

## 2019-03-28 DIAGNOSIS — G8929 Other chronic pain: Secondary | ICD-10-CM | POA: Diagnosis present

## 2019-03-28 DIAGNOSIS — E875 Hyperkalemia: Secondary | ICD-10-CM

## 2019-03-28 DIAGNOSIS — M353 Polymyalgia rheumatica: Secondary | ICD-10-CM | POA: Diagnosis present

## 2019-03-28 DIAGNOSIS — M109 Gout, unspecified: Secondary | ICD-10-CM | POA: Diagnosis present

## 2019-03-28 DIAGNOSIS — N183 Chronic kidney disease, stage 3 (moderate): Secondary | ICD-10-CM | POA: Diagnosis present

## 2019-03-28 DIAGNOSIS — Z8719 Personal history of other diseases of the digestive system: Secondary | ICD-10-CM

## 2019-03-28 DIAGNOSIS — E861 Hypovolemia: Secondary | ICD-10-CM | POA: Diagnosis present

## 2019-03-28 DIAGNOSIS — K219 Gastro-esophageal reflux disease without esophagitis: Secondary | ICD-10-CM | POA: Diagnosis present

## 2019-03-28 DIAGNOSIS — Z7982 Long term (current) use of aspirin: Secondary | ICD-10-CM

## 2019-03-28 DIAGNOSIS — E7800 Pure hypercholesterolemia, unspecified: Secondary | ICD-10-CM | POA: Diagnosis present

## 2019-03-28 DIAGNOSIS — E039 Hypothyroidism, unspecified: Secondary | ICD-10-CM | POA: Diagnosis present

## 2019-03-28 DIAGNOSIS — E872 Acidosis: Secondary | ICD-10-CM | POA: Diagnosis present

## 2019-03-28 DIAGNOSIS — Z79899 Other long term (current) drug therapy: Secondary | ICD-10-CM

## 2019-03-28 DIAGNOSIS — I5042 Chronic combined systolic (congestive) and diastolic (congestive) heart failure: Secondary | ICD-10-CM | POA: Diagnosis present

## 2019-03-28 DIAGNOSIS — Z79891 Long term (current) use of opiate analgesic: Secondary | ICD-10-CM

## 2019-03-28 DIAGNOSIS — Z20828 Contact with and (suspected) exposure to other viral communicable diseases: Secondary | ICD-10-CM | POA: Diagnosis present

## 2019-03-28 DIAGNOSIS — Z823 Family history of stroke: Secondary | ICD-10-CM

## 2019-03-28 DIAGNOSIS — Z90710 Acquired absence of both cervix and uterus: Secondary | ICD-10-CM | POA: Diagnosis not present

## 2019-03-28 DIAGNOSIS — M797 Fibromyalgia: Secondary | ICD-10-CM

## 2019-03-28 DIAGNOSIS — R778 Other specified abnormalities of plasma proteins: Secondary | ICD-10-CM | POA: Diagnosis not present

## 2019-03-28 DIAGNOSIS — M810 Age-related osteoporosis without current pathological fracture: Secondary | ICD-10-CM | POA: Diagnosis present

## 2019-03-28 DIAGNOSIS — R7989 Other specified abnormal findings of blood chemistry: Secondary | ICD-10-CM

## 2019-03-28 DIAGNOSIS — J309 Allergic rhinitis, unspecified: Secondary | ICD-10-CM | POA: Diagnosis present

## 2019-03-28 DIAGNOSIS — E876 Hypokalemia: Secondary | ICD-10-CM | POA: Diagnosis not present

## 2019-03-28 DIAGNOSIS — F419 Anxiety disorder, unspecified: Secondary | ICD-10-CM | POA: Diagnosis present

## 2019-03-28 DIAGNOSIS — E871 Hypo-osmolality and hyponatremia: Secondary | ICD-10-CM

## 2019-03-28 DIAGNOSIS — Z79890 Hormone replacement therapy (postmenopausal): Secondary | ICD-10-CM

## 2019-03-28 DIAGNOSIS — G4700 Insomnia, unspecified: Secondary | ICD-10-CM | POA: Diagnosis present

## 2019-03-28 DIAGNOSIS — Z803 Family history of malignant neoplasm of breast: Secondary | ICD-10-CM

## 2019-03-28 DIAGNOSIS — N179 Acute kidney failure, unspecified: Principal | ICD-10-CM

## 2019-03-28 DIAGNOSIS — I10 Essential (primary) hypertension: Secondary | ICD-10-CM

## 2019-03-28 DIAGNOSIS — Z800 Family history of malignant neoplasm of digestive organs: Secondary | ICD-10-CM

## 2019-03-28 DIAGNOSIS — Z85828 Personal history of other malignant neoplasm of skin: Secondary | ICD-10-CM

## 2019-03-28 DIAGNOSIS — Z03818 Encounter for observation for suspected exposure to other biological agents ruled out: Secondary | ICD-10-CM | POA: Diagnosis not present

## 2019-03-28 LAB — CBC WITH DIFFERENTIAL/PLATELET
Abs Immature Granulocytes: 0.04 10*3/uL (ref 0.00–0.07)
Basophils Absolute: 0 10*3/uL (ref 0.0–0.1)
Basophils Relative: 0 %
Eosinophils Absolute: 0 10*3/uL (ref 0.0–0.5)
Eosinophils Relative: 0 %
HCT: 30.3 % — ABNORMAL LOW (ref 36.0–46.0)
Hemoglobin: 9.8 g/dL — ABNORMAL LOW (ref 12.0–15.0)
Immature Granulocytes: 1 %
Lymphocytes Relative: 23 %
Lymphs Abs: 1.7 10*3/uL (ref 0.7–4.0)
MCH: 31.9 pg (ref 26.0–34.0)
MCHC: 32.3 g/dL (ref 30.0–36.0)
MCV: 98.7 fL (ref 80.0–100.0)
Monocytes Absolute: 0.6 10*3/uL (ref 0.1–1.0)
Monocytes Relative: 9 %
Neutro Abs: 4.9 10*3/uL (ref 1.7–7.7)
Neutrophils Relative %: 67 %
Platelets: 206 10*3/uL (ref 150–400)
RBC: 3.07 MIL/uL — ABNORMAL LOW (ref 3.87–5.11)
RDW: 14.3 % (ref 11.5–15.5)
WBC: 7.3 10*3/uL (ref 4.0–10.5)
nRBC: 0 % (ref 0.0–0.2)

## 2019-03-28 LAB — URINALYSIS, ROUTINE W REFLEX MICROSCOPIC
Bilirubin Urine: NEGATIVE
Glucose, UA: NEGATIVE mg/dL
Ketones, ur: 15 mg/dL — AB
Nitrite: NEGATIVE
Protein, ur: NEGATIVE mg/dL
Specific Gravity, Urine: <1.005 — ABNORMAL LOW (ref 1.005–1.030)
pH: 6 (ref 5.0–8.0)

## 2019-03-28 LAB — CREATININE, SERUM
Creatinine, Ser: 1.69 mg/dL — ABNORMAL HIGH (ref 0.44–1.00)
GFR calc Af Amer: 32 mL/min — ABNORMAL LOW (ref >60)
GFR calc non Af Amer: 27 mL/min — ABNORMAL LOW (ref >60)

## 2019-03-28 LAB — COMPREHENSIVE METABOLIC PANEL
ALT: 56 U/L — ABNORMAL HIGH (ref 0–44)
AST: 126 U/L — ABNORMAL HIGH (ref 15–41)
Albumin: 3.5 g/dL (ref 3.5–5.0)
Alkaline Phosphatase: 53 U/L (ref 38–126)
Anion gap: 13 (ref 5–15)
BUN: 69 mg/dL — ABNORMAL HIGH (ref 8–23)
CO2: 17 mmol/L — ABNORMAL LOW (ref 22–32)
Calcium: 8.1 mg/dL — ABNORMAL LOW (ref 8.9–10.3)
Chloride: 98 mmol/L (ref 98–111)
Creatinine, Ser: 1.91 mg/dL — ABNORMAL HIGH (ref 0.44–1.00)
GFR calc Af Amer: 27 mL/min — ABNORMAL LOW (ref >60)
GFR calc non Af Amer: 23 mL/min — ABNORMAL LOW (ref >60)
Glucose, Bld: 98 mg/dL (ref 70–99)
Potassium: 6 mmol/L — ABNORMAL HIGH (ref 3.5–5.1)
Sodium: 128 mmol/L — ABNORMAL LOW (ref 135–145)
Total Bilirubin: 0.5 mg/dL (ref 0.3–1.2)
Total Protein: 6.5 g/dL (ref 6.5–8.1)

## 2019-03-28 LAB — CBC
HCT: 29 % — ABNORMAL LOW (ref 36.0–46.0)
Hemoglobin: 9.5 g/dL — ABNORMAL LOW (ref 12.0–15.0)
MCH: 31.9 pg (ref 26.0–34.0)
MCHC: 32.8 g/dL (ref 30.0–36.0)
MCV: 97.3 fL (ref 80.0–100.0)
Platelets: 206 10*3/uL (ref 150–400)
RBC: 2.98 MIL/uL — ABNORMAL LOW (ref 3.87–5.11)
RDW: 14.1 % (ref 11.5–15.5)
WBC: 8.2 10*3/uL (ref 4.0–10.5)
nRBC: 0 % (ref 0.0–0.2)

## 2019-03-28 LAB — BASIC METABOLIC PANEL
Anion gap: 12 (ref 5–15)
BUN: 63 mg/dL — ABNORMAL HIGH (ref 8–23)
CO2: 14 mmol/L — ABNORMAL LOW (ref 22–32)
Calcium: 7.5 mg/dL — ABNORMAL LOW (ref 8.9–10.3)
Chloride: 103 mmol/L (ref 98–111)
Creatinine, Ser: 1.75 mg/dL — ABNORMAL HIGH (ref 0.44–1.00)
GFR calc Af Amer: 30 mL/min — ABNORMAL LOW (ref >60)
GFR calc non Af Amer: 26 mL/min — ABNORMAL LOW (ref >60)
Glucose, Bld: 83 mg/dL (ref 70–99)
Potassium: 5.5 mmol/L — ABNORMAL HIGH (ref 3.5–5.1)
Sodium: 129 mmol/L — ABNORMAL LOW (ref 135–145)

## 2019-03-28 LAB — TSH: TSH: 2.333 u[IU]/mL (ref 0.350–4.500)

## 2019-03-28 LAB — URINALYSIS, MICROSCOPIC (REFLEX)

## 2019-03-28 LAB — SARS CORONAVIRUS 2 AG (30 MIN TAT): SARS Coronavirus 2 Ag: NEGATIVE

## 2019-03-28 LAB — MAGNESIUM: Magnesium: 2.5 mg/dL — ABNORMAL HIGH (ref 1.7–2.4)

## 2019-03-28 LAB — TROPONIN I
Troponin I: 0.04 ng/mL (ref <0.03)
Troponin I: 0.1 ng/mL (ref <0.03)

## 2019-03-28 LAB — LIPASE, BLOOD: Lipase: 42 U/L (ref 11–51)

## 2019-03-28 LAB — BRAIN NATRIURETIC PEPTIDE: B Natriuretic Peptide: 463.4 pg/mL — ABNORMAL HIGH (ref 0.0–100.0)

## 2019-03-28 MED ORDER — PANTOPRAZOLE SODIUM 40 MG PO TBEC
40.0000 mg | DELAYED_RELEASE_TABLET | Freq: Every day | ORAL | Status: DC
  Administered 2019-03-29 – 2019-03-31 (×3): 40 mg via ORAL
  Filled 2019-03-28 (×3): qty 1

## 2019-03-28 MED ORDER — ATORVASTATIN CALCIUM 40 MG PO TABS: 40.0000 mg | ORAL_TABLET | Freq: Every day | ORAL | Status: DC

## 2019-03-28 MED ORDER — MULTIVITAMIN PO LIQD
Freq: Every day | ORAL | Status: DC
  Filled 2019-03-28 (×3): qty 15

## 2019-03-28 MED ORDER — CALCIUM CARBONATE 1500 (600 CA) MG PO TABS
600.0000 mg | ORAL_TABLET | Freq: Two times a day (BID) | ORAL | Status: DC
  Filled 2019-03-28: qty 1

## 2019-03-28 MED ORDER — VITAMIN D3 25 MCG (1000 UT) PO CAPS
1000.0000 [IU] | ORAL_CAPSULE | Freq: Every day | ORAL | Status: DC
  Filled 2019-03-28: qty 1

## 2019-03-28 MED ORDER — HYDROCODONE-ACETAMINOPHEN 5-325 MG PO TABS
1.0000 | ORAL_TABLET | Freq: Four times a day (QID) | ORAL | Status: DC | PRN
  Administered 2019-03-28 – 2019-03-30 (×4): 1 via ORAL
  Filled 2019-03-28 (×5): qty 1

## 2019-03-28 MED ORDER — HEPARIN SODIUM (PORCINE) 5000 UNIT/ML IJ SOLN
5000.0000 [IU] | Freq: Three times a day (TID) | INTRAMUSCULAR | Status: DC
  Administered 2019-03-28 – 2019-03-31 (×8): 5000 [IU] via SUBCUTANEOUS
  Filled 2019-03-28 (×8): qty 1

## 2019-03-28 MED ORDER — LEVOTHYROXINE SODIUM 75 MCG PO TABS
75.0000 ug | ORAL_TABLET | Freq: Every day | ORAL | Status: DC
  Administered 2019-03-29 – 2019-03-31 (×3): 75 ug via ORAL
  Filled 2019-03-28 (×3): qty 1

## 2019-03-28 MED ORDER — METHYLPREDNISOLONE 2 MG PO TABS
2.0000 mg | ORAL_TABLET | Freq: Every day | ORAL | Status: DC
  Filled 2019-03-28 (×2): qty 1

## 2019-03-28 MED ORDER — ASPIRIN EC 81 MG PO TBEC
81.0000 mg | DELAYED_RELEASE_TABLET | Freq: Every day | ORAL | Status: DC
  Administered 2019-03-29 – 2019-03-31 (×3): 81 mg via ORAL
  Filled 2019-03-28 (×3): qty 1

## 2019-03-28 MED ORDER — METOPROLOL SUCCINATE ER 100 MG PO TB24
100.0000 mg | ORAL_TABLET | Freq: Every day | ORAL | Status: DC
  Administered 2019-03-29 – 2019-03-31 (×3): 100 mg via ORAL
  Filled 2019-03-28 (×3): qty 1

## 2019-03-28 MED ORDER — SODIUM CHLORIDE 0.9 % IV BOLUS
500.0000 mL | Freq: Once | INTRAVENOUS | Status: AC
  Administered 2019-03-28: 500 mL via INTRAVENOUS

## 2019-03-28 MED ORDER — SODIUM CHLORIDE 0.9 % IV BOLUS
1000.0000 mL | Freq: Once | INTRAVENOUS | Status: AC
  Administered 2019-03-28: 1000 mL via INTRAVENOUS

## 2019-03-28 MED ORDER — OMEPRAZOLE MAGNESIUM 20 MG PO TBEC: 20.0000 mg | DELAYED_RELEASE_TABLET | Freq: Two times a day (BID) | ORAL | Status: DC

## 2019-03-28 NOTE — ED Notes (Signed)
Pt had bowel movement, unable to catch urine in process as clean catch.  Pt agrees to straight cath for specimen collection.

## 2019-03-28 NOTE — ED Provider Notes (Signed)
[facility] EMERGENCY DEPARTMENT Provider Note   CSN: [id number] Arrival date & time: 03/28/19  0932    History   Chief Complaint Chief Complaint  Patient presents with  . Weakness  . Generalized Body Aches    HPI [name] is a 85 y.o. female.  She lives independently.  She is complaining of feeling fatigued for about a week.  Decreased appetite.  Generalized weakness, some myalgias in her neck and back.  She had some labs done at her PCPs office at [facility] and was called today that they were abnormal and she needed to come to the emergency department for some IV fluids.  No cough no shortness of breath no fever no abdominal pain no nausea vomiting.  She is loose stools that she attributes to her gout medication.  She is noticed some dysuria and possibly a little hematuria recently.  No recent falls.  No known Covid exposures.     The history is provided by the patient.  Weakness  Severity:  Moderate Onset quality:  Gradual Duration:  6 days Timing:  Constant Progression:  Unchanged Chronicity:  New Context: not change in medication   Relieved by:  None tried Worsened by:  Activity Ineffective treatments:  Drinking fluids Associated symptoms: anorexia, difficulty walking, dysuria and myalgias   Associated symptoms: no abdominal pain, no chest pain, no cough, no numbness in extremities, no falls, no fever, no headaches, no loss of consciousness, no melena, no nausea, no shortness of breath, no stroke symptoms, no vision change and no vomiting     Past Medical History:  Diagnosis Date  . Allergic rhinitis   . Anemia    iron deficient  . Aneurysm ([place])   . Anxiety   . Basal cell carcinoma   . Chronic anemia   . Chronic low back pain   . Colon polyps   . GERD (gastroesophageal reflux disease)   . Hiatal hernia   . HTN (hypertension)   . Hypercholesteremia   . Hyperlipidemia   . Hypothyroidism   . Insomnia   . Left heart failure ([place])   . Osteoporosis     S/P forteo treatment as of 02/2009  . Pacemaker   . Polymyalgia ([place])    rheumatica-steroids per Rheum  . Systolic heart failure     Patient Active Problem List   Diagnosis Date Noted  . Cardiomyopathy ([place]) 01/25/2014  . Mitral valve disorder 01/25/2014  . Combined hyperlipidemia 01/25/2014  . Biventricular implantable cardioverter-defibrillator in situ 04/24/2011  . Chronic systolic heart failure ([place]) 04/24/2011  . Hypertension, benign 04/24/2011  . Dyslipidemia 04/24/2011    Past Surgical History:  Procedure Laterality Date  . ABDOMINAL HYSTERECTOMY    . ANGIOPLASTY    . BACK SURGERY    . CARDIAC CATHETERIZATION  10/16/06  . CATARACT EXTRACTION    . defibulator implant    . EP IMPLANTABLE DEVICE N/A 04/10/2016   Procedure: BiV Pacemaker Insertion CRT-P;  Surgeon: Evans Lance, MD;  Location: [facility];  Service: Cardiovascular;  Laterality: N/A;  . FOOT SURGERY    . MITRAL VALVE REPAIR    . skin cancer removal    . THYROIDECTOMY    . TONSILLECTOMY       OB History   No obstetric history on file.      Home Medications    Prior to Admission medications   Medication Sig Start Date End Date Taking? Authorizing Provider  aspirin EC 81 MG tablet Take  81 mg by mouth daily.    [provider]  atorvastatin (LIPITOR) 40 MG tablet Take 40 mg by mouth daily. 04/06/15   [provider]  Calcium Carbonate (CALTRATE 600 PO) Take 600 mg by mouth 2 (two) times daily.     [provider]  Cholecalciferol (VITAMIN D3) 1000 UNITS CAPS Take 1,000 Units by mouth daily.     [provider]  COLCRYS 0.6 MG tablet Take 0.6 mg by mouth 2 (two) times daily as needed (For gout flare-ups.).  01/08/16   [provider]  furosemide (LASIX) 40 MG tablet TAKE 1 TABLET EVERY MORNING  AND TAKE 1/2 TABLET IN THE EVENING 09/10/17   Jettie Booze, MD  HYDROcodone-acetaminophen (NORCO/VICODIN) 5-325 MG per tablet Take 1 tablet by mouth  every 6 (six) hours as needed (For pain.).  03/30/15   [provider]  levothyroxine (SYNTHROID, LEVOTHROID) 75 MCG tablet Take 1 tablet (75 mcg total) by mouth daily. 11/03/14   Jettie Booze, MD  lisinopril (PRINIVIL,ZESTRIL) 5 MG tablet Take 5 mg by mouth daily.    [provider]  methylPREDNISolone (MEDROL) 4 MG tablet Take 2 mg by mouth daily.    [provider]  metoprolol succinate (TOPROL-XL) 100 MG 24 hr tablet Take 100 mg by mouth daily. Take with or immediately following a meal.    [provider]  Multiple Vitamin (MULTIVITAMIN PO) Take 1 tablet by mouth daily.     [provider]  omeprazole (PRILOSEC OTC) 20 MG tablet Take 20 mg by mouth 2 (two) times daily.     [provider]  potassium chloride SA (KLOR-CON M20) 20 MEQ tablet Take one (1) tablet by mouth every morning and half (1/2) tablet by mouth every evening. 10/15/17   Evans Lance, MD    Family History Family History  Problem Relation Age of Onset  . Stroke Mother        in her 12's  . Cancer Father 33       secondary  . Colon cancer Brother   . Colon cancer Sister   . Breast cancer Sister     Social History Social History   Tobacco Use  . Smoking status: Never Smoker  . Smokeless tobacco: Never Used  Substance Use Topics  . Alcohol use: No  . Drug use: No     Allergies   Pertussis vaccines; Sulfa antibiotics; and Amoxicillin   Review of Systems Review of Systems  Constitutional: Positive for appetite change and fatigue. Negative for fever.  HENT: Negative for sore throat.   Eyes: Negative for visual disturbance.  Respiratory: Negative for cough and shortness of breath.   Cardiovascular: Negative for chest pain.  Gastrointestinal: Positive for anorexia. Negative for abdominal pain, melena, nausea and vomiting.  Genitourinary: Positive for dysuria and hematuria.  Musculoskeletal: Positive for myalgias. Negative for falls.  Skin:  Negative for rash.  Neurological: Positive for weakness. Negative for loss of consciousness and headaches.     Physical Exam Updated Vital Signs BP (!) 175/70 (BP Location: Right Arm)   Pulse 92   Resp 16   Ht 4\' 10"  (1.473 m)   Wt 46.3 kg   SpO2 98%   BMI 21.32 kg/m   Physical Exam Vitals signs and nursing note reviewed.  Constitutional:      General: She is not in acute distress.    Appearance: She is well-developed.  HENT:     Head: Normocephalic and atraumatic.  Eyes:  Conjunctiva/sclera: Conjunctivae normal.  Neck:     Musculoskeletal: Neck supple.  Cardiovascular:     Rate and Rhythm: Normal rate and regular rhythm.     Heart sounds: No murmur.  Pulmonary:     Effort: Pulmonary effort is normal. No respiratory distress.     Breath sounds: Normal breath sounds.  Abdominal:     Palpations: Abdomen is soft.     Tenderness: There is no abdominal tenderness.  Musculoskeletal: Normal range of motion.     Right lower leg: Edema (bilat ankles) present.     Left lower leg: Edema present.  Skin:    General: Skin is warm and dry.     Capillary Refill: Capillary refill takes less than 2 seconds.  Neurological:     General: No focal deficit present.     Mental Status: She is alert and oriented to person, place, and time.     Sensory: No sensory deficit.     Motor: No weakness.      ED Treatments / Results  Labs (all labs ordered are listed, but only abnormal results are displayed) Labs Reviewed  COMPREHENSIVE METABOLIC PANEL - Abnormal; Notable for the following components:      Result Value   Sodium 128 (*)    Potassium 6.0 (*)    CO2 17 (*)    BUN 69 (*)    Creatinine, Ser 1.91 (*)    Calcium 8.1 (*)    AST 126 (*)    ALT 56 (*)    GFR calc non Af Amer 23 (*)    GFR calc Af Amer 27 (*)    All other components within normal limits  TROPONIN I - Abnormal; Notable for the following components:   Troponin I 0.04 (*)    All other components within normal  limits  CBC WITH DIFFERENTIAL/PLATELET - Abnormal; Notable for the following components:   RBC 3.07 (*)    Hemoglobin 9.8 (*)    HCT 30.3 (*)    All other components within normal limits  MAGNESIUM - Abnormal; Notable for the following components:   Magnesium 2.5 (*)    All other components within normal limits  URINALYSIS, ROUTINE W REFLEX MICROSCOPIC - Abnormal; Notable for the following components:   Specific Gravity, Urine <1.005 (*)    Hgb urine dipstick SMALL (*)    Ketones, ur 15 (*)    Leukocytes,Ua SMALL (*)    All other components within normal limits  BASIC METABOLIC PANEL - Abnormal; Notable for the following components:   Sodium 129 (*)    Potassium 5.5 (*)    CO2 14 (*)    BUN 63 (*)    Creatinine, Ser 1.75 (*)    Calcium 7.5 (*)    GFR calc non Af Amer 26 (*)    GFR calc Af Amer 30 (*)    All other components within normal limits  TROPONIN I - Abnormal; Notable for the following components:   Troponin I 0.10 (*)    All other components within normal limits  URINALYSIS, MICROSCOPIC (REFLEX) - Abnormal; Notable for the following components:   Bacteria, UA FEW (*)    All other components within normal limits  BRAIN NATRIURETIC PEPTIDE - Abnormal; Notable for the following components:   B Natriuretic Peptide 463.4 (*)    All other components within normal limits  SARS CORONAVIRUS 2 (HOSP ORDER, PERFORMED IN [facility] VIA ABBOTT ID)  URINE CULTURE  LIPASE, BLOOD  TSH  CBC  CREATININE, SERUM  BASIC METABOLIC PANEL  CBC    EKG EKG Interpretation  Date/Time:  Saturday Mar 28 2019 09:49:01 EDT Ventricular Rate:  94 PR Interval:    QRS Duration: 156 QT Interval:  407 QTC Calculation: 509 R Axis:   -155 Text Interpretation:  Sinus rhythm Nonspecific intraventricular conduction delay Inferior infarct, old Probable anterolateral infarct, old Baseline wander in lead(s) V3 slower rate than prior 11/17 Confirmed by Aletta Edouard [id number]) on 03/28/2019  10:11:43 AM   Radiology No results found.  Procedures Procedures (including critical care time)  Medications Ordered in ED Medications  sodium chloride 0.9 % bolus 1,000 mL (has no administration in time range)     Initial Impression / Assessment and Plan / ED Course  I have reviewed the triage vital signs and the nursing notes.  Pertinent labs & imaging results that were available during my care of the patient were reviewed by me and considered in my medical decision making (see chart for details).  Clinical Course as of Mar 27 1836  Sat May 02, 452  [id number] 85 year old female here with generalized weakness x1 week.  Differential diagnosis includes dehydration, metabolic derangement, UTI, ACS   [MB]  1206 Patient's labs back and similar to yesterday and that she is hyponatremic hyperkalemic with an increase in her BUN and creatinine.  Her AST and ALT are also slightly elevated.  Her hemoglobin is low and a little bit lower than her baseline.  Her troponin is slightly elevated at 0.04.  She is received about a liter of fluid and again to give her a little bit more and then recheck her chemistries and troponin.   [MB]  1421 Electrolytes are somewhat improved and her potassium is gone from 6-5.5 and her creatinine is dropped to 1.75.  BUN is slightly improved.  She is received a liter and a half of fluid.   [MB]  6503 Patient's repeat troponin is gone from 0.04-0.1.  This will require further trending   [MB]  1502 The patient asked that I call her daughter and update her.  I talked to [facility] and she is in agreement with current plan of admit for continued management.   [MB]    Clinical Course User Index [MB] Hayden Rasmussen, MD   [name] was evaluated in Emergency Department on 03/28/2019 for the symptoms described in the history of present illness. She was evaluated in the context of the global COVID-19 pandemic, which necessitated consideration that the patient might be at  risk for infection with the SARS-CoV-2 virus that causes COVID-19. Institutional protocols and algorithms that pertain to the evaluation of patients at risk for COVID-19 are in a state of rapid change based on information released by regulatory bodies including the CDC and federal and state organizations. These policies and algorithms were followed during the patient's care in the ED.      Final Clinical Impressions(s) / ED Diagnoses   Final diagnoses:  AKI (acute kidney injury) ([place])  Hyperkalemia  Troponin level elevated    ED Discharge Orders    None       Hayden Rasmussen, MD 03/28/19 Bosie Helper

## 2019-03-28 NOTE — H&P (Signed)
History and Physical    [name] OHY:[id number] DOB: [birth year] DOA: 03/28/2019  PCP: Lawerance Cruel, MD   Patient coming from: Home   Chief Complaint: Generalized weakness.   HPI: [name] is a 85 y.o. female with medical history significant of recovered systolic (diastolic) heart failure that is post biventricular pacer, hypertension, severe osteoarthritis and fibromyalgia.  She has been developing progressive weakness for the last 7 days, generalized, moderate to severe in intensity, improved with rest, worse with exertion, no associated dyspnea, chest pain, PND orthopnea.  Positive pain at the base of the neck and occipital region, her GERD symptoms have been more severe, and her p.o. intake has significantly decreased.  Denies any fevers, chills or sick contacts.  Yesterday she was seen at urgent clinic due to generalized weakness, blood work was obtained and she was discharged home.  She was called back today with abnormal blood work and advised to go to the emergency department.  In Emergency department she received 1.5 L of normal saline with only slight improvement of her symptoms.  Her troponin was noted to be elevated, she was referred for admission for evaluation.   ED Course: Patient be very weak, deconditioned, not completely improved after fluid resuscitation with normal saline.  Troponin elevated 0.04 then 0.1.  Review of Systems:  1. General: No fevers, no chills, no weight gain or weight loss, generalized weakness as mentioned in history present illness. 2. ENT: No runny nose or sore throat, no hearing disturbances 3. Pulmonary: No dyspnea, cough, wheezing, or hemoptysis 4. Cardiovascular: No angina, claudication, lower extremity edema, pnd or orthopnea 5. Gastrointestinal: No nausea or vomiting, no diarrhea or constipation 6. Hematology: No easy bruisability or frequent infections 7. Urology: No dysuria, hematuria or increased urinary frequency 8.  Dermatology: No rashes. 9. Neurology: No seizures or paresthesias 10. Musculoskeletal: positive neck and occipital region pain, moderate in intensity, worse with movement.   Past Medical History:  Diagnosis Date  . Allergic rhinitis   . Anemia    iron deficient  . Aneurysm ([place])   . Anxiety   . Basal cell carcinoma   . Chronic anemia   . Chronic low back pain   . Colon polyps   . GERD (gastroesophageal reflux disease)   . Hiatal hernia   . HTN (hypertension)   . Hypercholesteremia   . Hyperlipidemia   . Hypothyroidism   . Insomnia   . Left heart failure ([place])   . Osteoporosis    S/P forteo treatment as of 02/2009  . Pacemaker   . Polymyalgia ([place])    rheumatica-steroids per Rheum  . Systolic heart failure     Past Surgical History:  Procedure Laterality Date  . ABDOMINAL HYSTERECTOMY    . ANGIOPLASTY    . BACK SURGERY    . CARDIAC CATHETERIZATION  10/16/06  . CATARACT EXTRACTION    . defibulator implant    . EP IMPLANTABLE DEVICE N/A 04/10/2016   Procedure: BiV Pacemaker Insertion CRT-P;  Surgeon: Evans Lance, MD;  Location: [facility];  Service: Cardiovascular;  Laterality: N/A;  . FOOT SURGERY    . MITRAL VALVE REPAIR    . skin cancer removal    . THYROIDECTOMY    . TONSILLECTOMY       reports that she has never smoked. She has never used smokeless tobacco. She reports that she does not drink alcohol or use drugs.  Allergies  Allergen Reactions  . Pertussis Vaccines Swelling  .  Sulfa Antibiotics Nausea Only  . Amoxicillin Rash and Other (See Comments)    Has patient had a PCN reaction causing immediate rash, facial/tongue/throat swelling, SOB or lightheadedness with hypotension: yes Has patient had a PCN reaction causing severe rash involving mucus membranes or skin necrosis: no Has patient had a PCN reaction that required hospitalization no Has patient had a PCN reaction occurring within the last 10 years: yes If all of the above answers are  "NO", then may proceed with Cephalosporin use.     Family History  Problem Relation Age of Onset  . Stroke Mother        in her 82's  . Cancer Father 71       secondary  . Colon cancer Brother   . Colon cancer Sister   . Breast cancer Sister      Prior to Admission medications   Medication Sig Start Date End Date Taking? Authorizing Provider  aspirin EC 81 MG tablet Take 81 mg by mouth daily.    [provider]  atorvastatin (LIPITOR) 40 MG tablet Take 40 mg by mouth daily. 04/06/15   [provider]  Calcium Carbonate (CALTRATE 600 PO) Take 600 mg by mouth 2 (two) times daily.     [provider]  Cholecalciferol (VITAMIN D3) 1000 UNITS CAPS Take 1,000 Units by mouth daily.     [provider]  COLCRYS 0.6 MG tablet Take 0.6 mg by mouth 2 (two) times daily as needed (For gout flare-ups.).  01/08/16   [provider]  furosemide (LASIX) 40 MG tablet TAKE 1 TABLET EVERY MORNING  AND TAKE 1/2 TABLET IN THE EVENING 09/10/17   Jettie Booze, MD  HYDROcodone-acetaminophen (NORCO/VICODIN) 5-325 MG per tablet Take 1 tablet by mouth every 6 (six) hours as needed (For pain.).  03/30/15   [provider]  levothyroxine (SYNTHROID, LEVOTHROID) 75 MCG tablet Take 1 tablet (75 mcg total) by mouth daily. 11/03/14   Jettie Booze, MD  lisinopril (PRINIVIL,ZESTRIL) 5 MG tablet Take 5 mg by mouth daily.    [provider]  methylPREDNISolone (MEDROL) 4 MG tablet Take 2 mg by mouth daily.    [provider]  metoprolol succinate (TOPROL-XL) 100 MG 24 hr tablet Take 100 mg by mouth daily. Take with or immediately following a meal.    [provider]  Multiple Vitamin (MULTIVITAMIN PO) Take 1 tablet by mouth daily.     [provider]  omeprazole (PRILOSEC OTC) 20 MG tablet Take 20 mg by mouth 2 (two) times daily.     [provider]  potassium chloride SA (KLOR-CON M20) 20 MEQ tablet Take one (1)  tablet by mouth every morning and half (1/2) tablet by mouth every evening. 10/15/17   Evans Lance, MD    Physical Exam: Vitals:   03/28/19 1330 03/28/19 1511 03/28/19 1548 03/28/19 1714  BP: 138/82 (!) 170/77 (!) 156/78 (!) 154/60  Pulse: 69 96 80 94  Resp: 19 17 16 18   Temp:   98 F (36.7 C) 97.7 F (36.5 C)  TempSrc:   Oral Oral  SpO2: 95% 98%  98%  Weight:    44.4 kg  Height:    4\' 8"  (1.422 m)    Vitals:   03/28/19 1330 03/28/19 1511 03/28/19 1548 03/28/19 1714  BP: 138/82 (!) 170/77 (!) 156/78 (!) 154/60  Pulse: 69 96 80 94  Resp: 19 17 16 18   Temp:   98 F (36.7 C) 97.7  F (36.5 C)  TempSrc:   Oral Oral  SpO2: 95% 98%  98%  Weight:    44.4 kg  Height:    4\' 8"  (1.422 m)   General: deconditioned and ill looking appearing.  Neurology: Awake and alert, non focal Head and Neck. Head normocephalic. Neck supple with no adenopathy or thyromegaly.   E ENT: mild pallor, no icterus, oral mucosa dry.  Cardiovascular: No JVD. S1-S2 present, rhythmic, no gallops, rubs, or murmurs. Trace lower extremity edema. Pulmonary: positive breath sounds bilaterally, adequate air movement, no wheezing, rhonchi or rales. Gastrointestinal. Abdomen with no organomegaly, non tender, no rebound or guarding Skin. No rashes Musculoskeletal: distal phalangeal hypertrophic deformities.     Labs on Admission: I have personally reviewed following labs and imaging studies  CBC: Recent Labs  Lab 03/28/19 1013  WBC 7.3  NEUTROABS 4.9  HGB 9.8*  HCT 30.3*  MCV 98.7  PLT 595   Basic Metabolic Panel: Recent Labs  Lab 03/28/19 1013 03/28/19 1339  NA 128* 129*  K 6.0* 5.5*  CL 98 103  CO2 17* 14*  GLUCOSE 98 83  BUN 69* 63*  CREATININE 1.91* 1.75*  CALCIUM 8.1* 7.5*  MG 2.5*  --    GFR: Estimated Creatinine Clearance: 14.7 mL/min (A) (by C-G formula based on SCr of 1.75 mg/dL (H)). Liver Function Tests: Recent Labs  Lab 03/28/19 1013  AST 126*  ALT 56*  ALKPHOS 53   BILITOT 0.5  PROT 6.5  ALBUMIN 3.5   Recent Labs  Lab 03/28/19 1013  LIPASE 42   No results for input(s): AMMONIA in the last 168 hours. Coagulation Profile: No results for input(s): INR, PROTIME in the last 168 hours. Cardiac Enzymes: Recent Labs  Lab 03/28/19 1013 03/28/19 1339  TROPONINI 0.04* 0.10*   BNP (last 3 results) No results for input(s): PROBNP in the last 8760 hours. HbA1C: No results for input(s): HGBA1C in the last 72 hours. CBG: No results for input(s): GLUCAP in the last 168 hours. Lipid Profile: No results for input(s): CHOL, HDL, LDLCALC, TRIG, CHOLHDL, LDLDIRECT in the last 72 hours. Thyroid Function Tests: Recent Labs    03/28/19 1013  TSH 2.333   Anemia Panel: No results for input(s): VITAMINB12, FOLATE, FERRITIN, TIBC, IRON, RETICCTPCT in the last 72 hours. Urine analysis:    Component Value Date/Time   COLORURINE YELLOW 03/28/2019 1006   APPEARANCEUR CLEAR 03/28/2019 1006   LABSPEC <1.005 (L) 03/28/2019 1006   PHURINE 6.0 03/28/2019 1006   GLUCOSEU NEGATIVE 03/28/2019 1006   HGBUR SMALL (A) 03/28/2019 1006   BILIRUBINUR NEGATIVE 03/28/2019 1006   KETONESUR 15 (A) 03/28/2019 1006   PROTEINUR NEGATIVE 03/28/2019 1006   UROBILINOGEN 0.2 10/18/2010 1650   NITRITE NEGATIVE 03/28/2019 1006   LEUKOCYTESUR SMALL (A) 03/28/2019 1006    Radiological Exams on Admission: No results found.  EKG: Independently reviewed.  94 bpm, a sensed, V paced right axis deviation, no ST segment or T wave changes.  Assessment/Plan Active Problems:   Hyponatremia   AKI (acute kidney injury) ([place])  85 year old female who presented with generalized weakness, persistent and worsening for the last 7 days, associated with poor oral intake, worsening reflux symptoms along with worsening arthritic/ muscular pain.  On her initial physical examination blood pressure is 136/78, heart rate 80, temperature 98, respiratory rate 16, oxygen saturation 90% on room air.  She  had mild pallor, dry mucous membranes, her lungs are clear to auscultation, heart S1-2 present regular, abdomen soft and trace  lower extremity edema.  Sodium 129, potassium 5.5 (from 6.0), chloride 103, bicarb 14, glucose 83, BUN 63, creatinine 1.75, AST 126, ALT 56, troponin 0.04, 0.10, white count 7.3, hemoglobin 9.8, hematocrit 30.3, platelets 206. SARS Covid 19 negative, urinalysis specific gravity less than 1.005, red cells 6-10, white cells 6-10.  Patient will be admitted to the hospital with a working diagnosis of acute kidney injury, complicated by hyperkalemia, metabolic acidosis related to hypovolemia/ overdiuresis.  1.  Acute kidney injury due to hypovolemia complicated by non-anion gap metabolic acidosis, hyponatremia and hyperkalemia.  Continue hydration with isotonic saline at 75 mL/h, patient has received already 1.5 L in the emergency department.  Her urine specific gravity is very low, suggesting overdiuresis, hold on furosemide.  Potassium is trending down, will give patient 2 doses of sodium zirconium.  2.  Chronic kidney disease stage 3B.  Her base creatinine is 1.2, 1.3, old records personally, calculated GFR 37, consistent with chronic kidney disease stage III.   3.  Hypertension and chronic recovered systolic (diastolic) heart failure.  Echocardiogram from 2017 showed left ventricle 55 to 60% ejection fraction with no significant valvulopathy.  Troponin elevation likely related to decreased GFR, no signs of acute coronary syndrome.  Continue telemetry monitoring, trend cardiac enzymes.  Continue metoprolol succinate.  Hold on lisinopril and furosemide due to worsening kidney function.  4.  Elevated liver enzymes.  Likely related to hypoperfusion due to hypovolemia, follow-up liver panel in the morning.  Patient with no encephalopathy.  5.  Musculoskeletal pain, fibromyalgia.  Patient will continue her oral analgesics, will add muscle relaxant low-dose 3 times daily with  cyclobenzaprine.  Physical therapy evaluation.  Continue home dose of prednisone.  Currently no indication for stress dose steroids.  6.  Dyslipidemia.  Hold on atorvastatin due to elevated liver enzymes.  7.  Hypothyroid.  Continue levothyroxine.  8. GERD. Continue omeprazole, and advance diet as tolerated.   DVT prophylaxis: enoxaparin  Code Status:  full  Family Communication: no family at the bedside   Disposition Plan: Telemetry   Consults called: none   Admission status: Inpatient.     Herbie Lehrmann Gerome Apley MD [facility]   03/28/2019, 5:49 PM

## 2019-03-28 NOTE — ED Triage Notes (Signed)
Sent by Dr Sabra Heck for abnormal labs collected yesterday, pt states low potassium, elevated BUN/CR.  Pt reports fatigue and generalized weakness, body aches for the past week.  Pt lives alone, with daughters assisting as needed.  A&O x3, denies nausea/vomiting/diarrhea although states at times when she gets up quickly she can be nauseous, but it subsides when she sits back down.  Has pacemaker.  Followed by cardiology,

## 2019-03-28 NOTE — ED Notes (Signed)
Report Called to receiving RN Trixie RN at [facility].  Awaiting transport

## 2019-03-28 NOTE — ED Notes (Addendum)
Troponin 0.04, results given to ED MD and RN

## 2019-03-28 NOTE — ED Notes (Signed)
Pt unable to provide urine specimen at this time

## 2019-03-28 NOTE — ED Notes (Signed)
Daughter left contact info if needed: Tana Felts Daughter [phone] (H) [phone] (C)  Consuella Lose (daughter) [phone]

## 2019-03-28 NOTE — Progress Notes (Addendum)
[name]  85 y.o., [birth year]   H/o CHF s/p BiVPPM, osteoporosis with multiple vertebral compression fractures in the past, on chronic steroids for PMR is referred to the ED by pcp due to hyponatremia, mild hyperkalemia without EKG changes and aki.  Patient  c/o feeling weak for a week. Had a tele visit with [facility], pcp checked her labs with above resolved, pcp thought she is dehydrated and referred her to ED for hydration, patient received 1.5liter ns, repeat labs with slight improvement. ,  troponin was checked 0.04 then 0.1, I have asked EDP to add on BNP. Patient does not have chest pain, No acute EKG changes. EDP think patient is very frail, she lives alone, she needs to be admitted to the hospital under observation status, continue hydration and repeat labs in am.  Patient is accepted to med /tele under observation status.  Please call flow manager upon patient's arrival.

## 2019-03-29 DIAGNOSIS — I5032 Chronic diastolic (congestive) heart failure: Secondary | ICD-10-CM

## 2019-03-29 LAB — BASIC METABOLIC PANEL
Anion gap: 11 (ref 5–15)
BUN: 50 mg/dL — ABNORMAL HIGH (ref 8–23)
CO2: 17 mmol/L — ABNORMAL LOW (ref 22–32)
Calcium: 8.2 mg/dL — ABNORMAL LOW (ref 8.9–10.3)
Chloride: 110 mmol/L (ref 98–111)
Creatinine, Ser: 1.4 mg/dL — ABNORMAL HIGH (ref 0.44–1.00)
GFR calc Af Amer: 40 mL/min — ABNORMAL LOW (ref >60)
GFR calc non Af Amer: 34 mL/min — ABNORMAL LOW (ref >60)
Glucose, Bld: 93 mg/dL (ref 70–99)
Potassium: 4.8 mmol/L (ref 3.5–5.1)
Sodium: 138 mmol/L (ref 135–145)

## 2019-03-29 LAB — CBC
HCT: 28.3 % — ABNORMAL LOW (ref 36.0–46.0)
Hemoglobin: 9.4 g/dL — ABNORMAL LOW (ref 12.0–15.0)
MCH: 32.3 pg (ref 26.0–34.0)
MCHC: 33.2 g/dL (ref 30.0–36.0)
MCV: 97.3 fL (ref 80.0–100.0)
Platelets: 206 10*3/uL (ref 150–400)
RBC: 2.91 MIL/uL — ABNORMAL LOW (ref 3.87–5.11)
RDW: 14.5 % (ref 11.5–15.5)
WBC: 8.8 10*3/uL (ref 4.0–10.5)
nRBC: 0 % (ref 0.0–0.2)

## 2019-03-29 LAB — TROPONIN I
Troponin I: 0.71 ng/mL (ref <0.03)
Troponin I: 0.46 ng/mL (ref <0.03)

## 2019-03-29 LAB — HEPATIC FUNCTION PANEL
ALT: 51 U/L — ABNORMAL HIGH (ref 0–44)
AST: 95 U/L — ABNORMAL HIGH (ref 15–41)
Albumin: 2.8 g/dL — ABNORMAL LOW (ref 3.5–5.0)
Alkaline Phosphatase: 50 U/L (ref 38–126)
Bilirubin, Direct: <0.1 mg/dL (ref 0.0–0.2)
Total Bilirubin: 0.8 mg/dL (ref 0.3–1.2)
Total Protein: 5.3 g/dL — ABNORMAL LOW (ref 6.5–8.1)

## 2019-03-29 LAB — URINE CULTURE: Culture: NO GROWTH

## 2019-03-29 MED ORDER — CALCIUM CARBONATE 1250 (500 CA) MG PO TABS
1.0000 | ORAL_TABLET | Freq: Two times a day (BID) | ORAL | Status: DC
  Administered 2019-03-30 (×2): 500 mg via ORAL
  Filled 2019-03-29 (×5): qty 1

## 2019-03-29 MED ORDER — SUCRALFATE 1 GM/10ML PO SUSP
1.0000 g | Freq: Three times a day (TID) | ORAL | Status: DC
  Administered 2019-03-29 – 2019-03-31 (×7): 1 g via ORAL
  Filled 2019-03-29 (×7): qty 10

## 2019-03-29 MED ORDER — SODIUM ZIRCONIUM CYCLOSILICATE 5 G PO PACK
5.0000 g | PACK | Freq: Two times a day (BID) | ORAL | Status: DC
  Administered 2019-03-29: 5 g via ORAL
  Filled 2019-03-29 (×2): qty 1

## 2019-03-29 MED ORDER — ALUM & MAG HYDROXIDE-SIMETH 200-200-20 MG/5ML PO SUSP
15.0000 mL | Freq: Once | ORAL | Status: AC
  Administered 2019-03-29: 15 mL via ORAL
  Filled 2019-03-29: qty 30

## 2019-03-29 MED ORDER — VITAMIN D 25 MCG (1000 UNIT) PO TABS
1000.0000 [IU] | ORAL_TABLET | Freq: Every day | ORAL | Status: DC
  Administered 2019-03-29 – 2019-03-31 (×3): 1000 [IU] via ORAL
  Filled 2019-03-29 (×3): qty 1

## 2019-03-29 MED ORDER — SIMETHICONE 80 MG PO CHEW
80.0000 mg | CHEWABLE_TABLET | Freq: Four times a day (QID) | ORAL | Status: DC | PRN
  Administered 2019-03-29 (×2): 80 mg via ORAL
  Filled 2019-03-29 (×2): qty 1

## 2019-03-29 MED ORDER — ADULT MULTIVITAMIN LIQUID CH
15.0000 mL | Freq: Every day | ORAL | Status: DC
  Administered 2019-03-30: 15 mL via ORAL
  Filled 2019-03-29 (×3): qty 15

## 2019-03-29 MED ORDER — METHYLPREDNISOLONE 4 MG PO TABS
2.0000 mg | ORAL_TABLET | Freq: Every day | ORAL | Status: DC
  Administered 2019-03-29 – 2019-03-31 (×3): 2 mg via ORAL
  Filled 2019-03-29 (×3): qty 1

## 2019-03-29 MED ORDER — SODIUM CHLORIDE 0.9 % IV SOLN
INTRAVENOUS | Status: DC
  Administered 2019-03-29: 05:00:00 via INTRAVENOUS

## 2019-03-29 MED ORDER — CYCLOBENZAPRINE HCL 5 MG PO TABS
5.0000 mg | ORAL_TABLET | Freq: Three times a day (TID) | ORAL | Status: DC
  Administered 2019-03-29 – 2019-03-31 (×7): 5 mg via ORAL
  Filled 2019-03-29 (×7): qty 1

## 2019-03-29 NOTE — Progress Notes (Signed)
PROGRESS NOTE    [name]  CZY:[id number] DOB: January 02, 1932 DOA: 03/28/2019 PCP: Lawerance Cruel, MD    Brief Narrative:  85 year old female who presented with generalized weakness, persistent and worsening for the last 7 days, associated with poor oral intake, worsening reflux symptoms along with worsening arthritic/ muscular pain.  On her initial physical examination blood pressure is 136/78, heart rate 80, temperature 98, respiratory rate 16, oxygen saturation 90% on room air.  She had mild pallor, dry mucous membranes, her lungs are clear to auscultation, heart S1-2 present regular, abdomen soft and trace lower extremity edema.  Sodium 129, potassium 5.5 (from 6.0), chloride 103, bicarb 14, glucose 83, BUN 63, creatinine 1.75, AST 126, ALT 56, troponin 0.04, 0.10, white count 7.3, hemoglobin 9.8, hematocrit 30.3, platelets 206. SARS Covid 19 negative, urinalysis specific gravity less than 1.005, red cells 6-10, white cells 6-10.  Patient will be admitted to the hospital with a working diagnosis of acute kidney injury, complicated by hyperkalemia, metabolic acidosis related to hypovolemia/ overdiuresis.  Assessment & Plan:   Active Problems:   Hyponatremia   AKI (acute kidney injury) ([place])  1. AKI on CKD stage 3b with hyponatremia, hyperkalemia and non gap metabolic acidosis. Improved renal function with serum cr down to 1,40, K at 4,8 and serum bicarbonate at 17. Will continue hydration with isotonic saline for the next 24 H. Continue to hold on diuretics and ace inh. Will hold on sodium zirconium for now.   2. Diastolic heart failure. Patient with recovered LV systolic function. No signs of acute exacerbation, tolerating IV fluids well. Continue blood pressure monitoring.   3. HTN. Continue to hold on lisinopril and furosemide.   4. Elevated liver enzymes. AST down to 95 and ALT down to 51, likely low perfusion related. Will continue IV fluids.   5. Troponin elevation. No chest  pain and ekg with V paced .Coontinue to trend troponin I. Possible troponin elevation related to renal failure and deceased GFR.   6. Dyspepsia. Continue antiacid therapy, will add sucralfate.  7. Hypothyroid. Continue levothyroxine.   8. Fibromyalgia. Continue flexeril and methylprednisolone (home dose)  DVT prophylaxis: heparin   Code Status: full Family Communication: no family at the bedside  Disposition Plan/ discharge barriers: pending clinically improvement   Body mass index is 21.81 kg/m. Malnutrition Type:      Malnutrition Characteristics:      Nutrition Interventions:     RN Pressure Injury Documentation:     Consultants:     Procedures:     Antimicrobials:       Subjective: Patient continue to have generalized weakness, no nausea or vomiting, positive gas sensation. NO chest pain. NO dyspnea.   Objective: Vitals:   03/29/19 0502 03/29/19 0527 03/29/19 1034 03/29/19 1132  BP: (!) 138/46 123/70 (!) 151/56 (!) 145/60  Pulse: 85 (!) 57 88 74  Resp:  18  20  Temp: 97.6 F (36.4 C) 98.3 F (36.8 C)  (!) 97.3 F (36.3 C)  TempSrc: Oral Oral  Oral  SpO2: 96% 99% 99% 100%  Weight:  44.1 kg    Height:        Intake/Output Summary (Last 24 hours) at 03/29/2019 1150 Last data filed at 03/29/2019 0826 Gross per 24 hour  Intake 840 ml  Output 1500 ml  Net -660 ml   Filed Weights   03/28/19 0952 03/28/19 1714 03/29/19 0527  Weight: 46.3 kg 44.4 kg 44.1 kg    Examination:   General: deconditioned  and ill looking appearing  Neurology: Awake and alert, non focal  E ENT: mild pallor, no icterus, oral mucosa moist Cardiovascular: No JVD. S1-S2 present, rhythmic, no gallops, rubs, or murmurs. No lower extremity edema. Pulmonary: positive breath sounds bilaterally, adequate air movement, no wheezing, rhonchi or rales. Gastrointestinal. Abdomen mild distended with no organomegaly, non tender, no rebound or guarding Skin. No rashes  Musculoskeletal: no joint deformities     Data Reviewed: I have personally reviewed following labs and imaging studies  CBC: Recent Labs  Lab 03/28/19 1013 03/28/19 1823 03/29/19 0513  WBC 7.3 8.2 8.8  NEUTROABS 4.9  --   --   HGB 9.8* 9.5* 9.4*  HCT 30.3* 29.0* 28.3*  MCV 98.7 97.3 97.3  PLT 206 206 213   Basic Metabolic Panel: Recent Labs  Lab 03/28/19 1013 03/28/19 1339 03/28/19 1823 03/29/19 0513  NA 128* 129*  --  138  K 6.0* 5.5*  --  4.8  CL 98 103  --  110  CO2 17* 14*  --  17*  GLUCOSE 98 83  --  93  BUN 69* 63*  --  50*  CREATININE 1.91* 1.75* 1.69* 1.40*  CALCIUM 8.1* 7.5*  --  8.2*  MG 2.5*  --   --   --    GFR: Estimated Creatinine Clearance: 18.3 mL/min (A) (by C-G formula based on SCr of 1.4 mg/dL (H)). Liver Function Tests: Recent Labs  Lab 03/28/19 1013 03/29/19 0513  AST 126* 95*  ALT 56* 51*  ALKPHOS 53 50  BILITOT 0.5 0.8  PROT 6.5 5.3*  ALBUMIN 3.5 2.8*   Recent Labs  Lab 03/28/19 1013  LIPASE 42   No results for input(s): AMMONIA in the last 168 hours. Coagulation Profile: No results for input(s): INR, PROTIME in the last 168 hours. Cardiac Enzymes: Recent Labs  Lab 03/28/19 1013 03/28/19 1339 03/29/19 0513  TROPONINI 0.04* 0.10* 0.71*   BNP (last 3 results) No results for input(s): PROBNP in the last 8760 hours. HbA1C: No results for input(s): HGBA1C in the last 72 hours. CBG: No results for input(s): GLUCAP in the last 168 hours. Lipid Profile: No results for input(s): CHOL, HDL, LDLCALC, TRIG, CHOLHDL, LDLDIRECT in the last 72 hours. Thyroid Function Tests: Recent Labs    03/28/19 1013  TSH 2.333   Anemia Panel: No results for input(s): VITAMINB12, FOLATE, FERRITIN, TIBC, IRON, RETICCTPCT in the last 72 hours.    Radiology Studies: I have reviewed all of the imaging during this hospital visit personally     Scheduled Meds: . aspirin EC  81 mg Oral Daily  . calcium carbonate  1 tablet Oral BID WC   . cholecalciferol  1,000 Units Oral Daily  . cyclobenzaprine  5 mg Oral TID  . heparin  5,000 Units Subcutaneous Q8H  . levothyroxine  75 mcg Oral QAC breakfast  . methylPREDNISolone  2 mg Oral Daily  . metoprolol succinate  100 mg Oral Daily  . multivitamin  15 mL Oral Daily  . pantoprazole  40 mg Oral Daily  . sodium zirconium cyclosilicate  5 g Oral BID   Continuous Infusions: . sodium chloride 75 mL/hr at 03/29/19 0452     LOS: 1 day         Gerome Apley, MD

## 2019-03-29 NOTE — Progress Notes (Signed)
Patient refused her multivitamin and Calcium.   Stated it upset her stomach.

## 2019-03-29 NOTE — Progress Notes (Signed)
Patient having a lot of gas pains.  Has taken GasX in past for it.  Page MD

## 2019-03-29 NOTE — Progress Notes (Signed)
PT Cancellation Note  Patient Details Name: [name] MRN: [id number] DOB: Sep 15, 1932   Cancelled Treatment:    Reason Eval/Treat Not Completed: (P) Medical issues which prohibited therapy Pt currently with up trending troponins. Will await for clearance from Cardiology or down trending troponins prior to Evaluation. PT will check back this afternoon.   Holliday Sheaffer B. Migdalia Dk PT, DPT Acute Rehabilitation Services Pager [phone] Office [phone]    [place] 03/29/2019, 8:17 AM

## 2019-03-30 LAB — BASIC METABOLIC PANEL
Anion gap: 7 (ref 5–15)
BUN: 29 mg/dL — ABNORMAL HIGH (ref 8–23)
CO2: 20 mmol/L — ABNORMAL LOW (ref 22–32)
Calcium: 8.1 mg/dL — ABNORMAL LOW (ref 8.9–10.3)
Chloride: 113 mmol/L — ABNORMAL HIGH (ref 98–111)
Creatinine, Ser: 0.95 mg/dL (ref 0.44–1.00)
GFR calc Af Amer: >60 mL/min (ref >60)
GFR calc non Af Amer: 55 mL/min — ABNORMAL LOW (ref >60)
Glucose, Bld: 85 mg/dL (ref 70–99)
Potassium: 4.8 mmol/L (ref 3.5–5.1)
Sodium: 140 mmol/L (ref 135–145)

## 2019-03-30 MED ORDER — AMLODIPINE BESYLATE 2.5 MG PO TABS: 5.0000 mg | ORAL_TABLET | Freq: Every day | ORAL | Status: DC

## 2019-03-30 MED ORDER — AMLODIPINE BESYLATE 2.5 MG PO TABS
2.5000 mg | ORAL_TABLET | Freq: Every day | ORAL | Status: DC
  Administered 2019-03-30 – 2019-03-31 (×2): 2.5 mg via ORAL
  Filled 2019-03-30 (×2): qty 1

## 2019-03-30 NOTE — Evaluation (Signed)
Physical Therapy Evaluation Patient Details Name: [name] MRN: [id number] DOB: [birth year] Today's Date: 03/30/2019   History of Present Illness  85 yo admitted with progressive weakness with AKI, hyponatremia, hyperkalemia. PMhx: HTN, CHf, fibromyalgia, pacemaker, severe OA  Clinical Impression  PT pleasant and reports feeling stronger than on admission. PT lives alone is normally independent including taking out her garbage cans and grocery shopping but reports a 3wk decline in function. Pt with generalized weakness and fatigue who will benefit from acute therapy to maximize mobility, safety and function to decrease burden of care. PT encouraged to use RW for out of house ambulation due to slight instability with gait with long hall distance and to walk daily with nursing acutely.   HR 98-106 with gait    Follow Up Recommendations Home health PT    Equipment Recommendations  None recommended by PT    Recommendations for Other Services       Precautions / Restrictions Precautions Precautions: Fall Restrictions Weight Bearing Restrictions: No      Mobility  Bed Mobility Overal bed mobility: Modified Independent             General bed mobility comments: bed flat with use of rail   Transfers Overall transfer level: Modified independent                  Ambulation/Gait Ambulation/Gait assistance: Supervision Gait Distance (Feet): 350 Feet Assistive device: None Gait Pattern/deviations: Decreased stride length;Step-through pattern;Trunk flexed;Wide base of support   Gait velocity interpretation: 1.31 - 2.62 ft/sec, indicative of limited community ambulator General Gait Details: pt with slow gait with one partial LOB able to recover on her own without AD, trunk flexed throughout  Stairs            Wheelchair Mobility    Modified Rankin (Stroke Patients Only)       Balance Overall balance assessment: Needs assistance   Sitting balance-Leahy  Scale: Good       Standing balance-Leahy Scale: Good                               Pertinent Vitals/Pain Pain Assessment: No/denies pain    Home Living Family/patient expects to be discharged to:: Private residence Living Arrangements: Alone Available Help at Discharge: Family;Available 24 hours/day Type of Home: House Home Access: Stairs to enter Entrance Stairs-Rails: None Entrance Stairs-Number of Steps: 2 Home Layout: One level Home Equipment: Environmental consultant - 2 wheels      Prior Function Level of Independence: Independent               Hand Dominance        Extremity/Trunk Assessment   Upper Extremity Assessment Upper Extremity Assessment: Generalized weakness    Lower Extremity Assessment Lower Extremity Assessment: Generalized weakness    Cervical / Trunk Assessment Cervical / Trunk Assessment: Kyphotic  Communication   Communication: No difficulties  Cognition Arousal/Alertness: Awake/alert Behavior During Therapy: WFL for tasks assessed/performed Overall Cognitive Status: Within Functional Limits for tasks assessed                                        General Comments      Exercises General Exercises - Lower Extremity Long Arc Quad: AROM;15 reps;Seated Hip Flexion/Marching: AROM;15 reps;Seated   Assessment/Plan    PT Assessment Patient needs continued PT  services  PT Problem List Decreased mobility;Decreased activity tolerance;Decreased strength;Decreased balance       PT Treatment Interventions Gait training;Therapeutic activities;Therapeutic exercise;Stair training;DME instruction;Functional mobility training;Balance training;Patient/family education    PT Goals (Current goals can be found in the Care Plan section)  Acute Rehab PT Goals Patient Stated Goal: return home and garden PT Goal Formulation: With patient Time For Goal Achievement: 04/13/19 Potential to Achieve Goals: Good    Frequency Min  3X/week   Barriers to discharge        Co-evaluation               AM-PAC PT "6 Clicks" Mobility  Outcome Measure Help needed turning from your back to your side while in a flat bed without using bedrails?: A Little Help needed moving from lying on your back to sitting on the side of a flat bed without using bedrails?: None Help needed moving to and from a bed to a chair (including a wheelchair)?: A Little Help needed standing up from a chair using your arms (e.g., wheelchair or bedside chair)?: A Little Help needed to walk in hospital room?: A Little Help needed climbing 3-5 steps with a railing? : A Little 6 Click Score: 19    End of Session Equipment Utilized During Treatment: Gait belt Activity Tolerance: Patient tolerated treatment well Patient left: in chair;with call bell/phone within reach;with chair alarm set Nurse Communication: Mobility status PT Visit Diagnosis: Other abnormalities of gait and mobility (R26.89);Muscle weakness (generalized) (M62.81)    Time: 5189-8421 PT Time Calculation (min) (ACUTE ONLY): 28 min   Charges:   PT Evaluation $PT Eval Moderate Complexity: 1 Mod          [place], PT Acute Rehabilitation Services Pager: [phone] Office: [place] 03/30/2019, 11:23 AM

## 2019-03-30 NOTE — Progress Notes (Signed)
PROGRESS NOTE    [name]  SHF:[id number] DOB: 1932-03-22 DOA: 03/28/2019 PCP: Lawerance Cruel, MD    Brief Narrative:  85 year old female who presented with generalized weakness, persistent and worsening for the last 7 days, associated with poor oral intake,worsening reflux symptoms along with worsening arthritic/muscular pain. On her initial physical examination blood pressure is 136/78, heart rate 80, temperature 98, respiratory rate 16, oxygen saturation 90% on room air. She had mild pallor, dry mucous membranes, her lungs are clear to auscultation, heart S1-2 present regular, abdomen soft and trace lower extremity edema.Sodium 129, potassium 5.5 (from 6.0), chloride 103, bicarb 14, glucose 83, BUN 63, creatinine 1.75, AST 126, ALT 56, troponin 0.04, 0.10, white count 7.3, hemoglobin 9.8, hematocrit 30.3, platelets 206. SARS Covid 19 negative,urinalysis specific gravity less than 1.005,red cells 6-10, white cells 6-10.  Patient will be admitted to the hospital with a working diagnosis ofacutekidney injury, complicated by hyperkalemia, metabolic acidosis related to hypovolemia/overdiuresis.   Assessment & Plan:   Active Problems:   Hyponatremia   AKI (acute kidney injury) ([place])  1. AKI on CKD stage 3b with hyponatremia, hyperkalemia and non gap metabolic acidosis. Today with no signs of volume overload, her renal function has improved with serum cr at 0.95, Na is 140, K at 4,8 and serum bicarbonate at 20. Improved po intake. Continue to hold on furosemide and K supplements. Will recommend to use diuretic therapy only as needed.   2. Diastolic heart failure/ 5027 echocardiogram with preserved LV systolic function 55 to 74% Currently with no signs of acute exacerbation. IV fluids have been discontinued. Continue blood pressure monitoring.   3. HTN. Blood pressure 142/73, 155/70 this am, will add low dose amlodipine, and continue close monitoring.   4. Elevated liver  enzymes. Clinically have resolved, follow as outpatient, avoid hyptension.    5. Troponin elevation. Patient has remained chest pain free, troponin I is trending down. Ruled out acute coronary syndrome.   6. Dyspepsia. Improved symptoms with antiacid therapy continue proton pump inhibition and sucralfate.   7. Hypothyroid. On levothyroxine.   8. Fibromyalgia. Tolerating well flexeril. Continue home dose of  Methylprednisolone.   DVT prophylaxis: heparin   Code Status: full Family Communication: no family at the bedside  Disposition Plan/ discharge barriers: pending clinically improvement    Body mass index is 21.79 kg/m. Malnutrition Type:      Malnutrition Characteristics:      Nutrition Interventions:     RN Pressure Injury Documentation:     Consultants:     Procedures:     Antimicrobials:       Subjective: Patient is feeling better, but not yet back to baseline, continue to have lower extremity weakness, no dyspnea or chest pain. Improved GERD symptoms and tolerating po well.   Objective: Vitals:   03/30/19 0615 03/30/19 0620 03/30/19 0809 03/30/19 1127  BP:  (!) 155/70  (!) 142/73  Pulse:  93 98 93  Resp:  18  16  Temp:  97.6 F (36.4 C)  (!) 97.4 F (36.3 C)  TempSrc:    Oral  SpO2:  97%  100%  Weight: 44.1 kg     Height:        Intake/Output Summary (Last 24 hours) at 03/30/2019 1213 Last data filed at 03/30/2019 1211 Gross per 24 hour  Intake 1282.41 ml  Output 2001 ml  Net -718.59 ml   Filed Weights   03/28/19 1714 03/29/19 0527 03/30/19 0615  Weight: 44.4 kg 44.1  kg 44.1 kg    Examination:   General: deconditioned  Neurology: Awake and alert, non focal  E ENT: mild pallor, no icterus, oral mucosa moist Cardiovascular: No JVD. S1-S2 present, rhythmic, no gallops, rubs, or murmurs. No lower extremity edema. Pulmonary: positive breath sounds bilaterally, adequate air movement, no wheezing, rhonchi or rales.  Gastrointestinal. Abdomen with no organomegaly, non tender, no rebound or guarding Skin. No rashes Musculoskeletal: no joint deformities     Data Reviewed: I have personally reviewed following labs and imaging studies  CBC: Recent Labs  Lab 03/28/19 1013 03/28/19 1823 03/29/19 0513  WBC 7.3 8.2 8.8  NEUTROABS 4.9  --   --   HGB 9.8* 9.5* 9.4*  HCT 30.3* 29.0* 28.3*  MCV 98.7 97.3 97.3  PLT 206 206 545   Basic Metabolic Panel: Recent Labs  Lab 03/28/19 1013 03/28/19 1339 03/28/19 1823 03/29/19 0513 03/30/19 0559  NA 128* 129*  --  138 140  K 6.0* 5.5*  --  4.8 4.8  CL 98 103  --  110 113*  CO2 17* 14*  --  17* 20*  GLUCOSE 98 83  --  93 85  BUN 69* 63*  --  50* 29*  CREATININE 1.91* 1.75* 1.69* 1.40* 0.95  CALCIUM 8.1* 7.5*  --  8.2* 8.1*  MG 2.5*  --   --   --   --    GFR: Estimated Creatinine Clearance: 26.9 mL/min (by C-G formula based on SCr of 0.95 mg/dL). Liver Function Tests: Recent Labs  Lab 03/28/19 1013 03/29/19 0513  AST 126* 95*  ALT 56* 51*  ALKPHOS 53 50  BILITOT 0.5 0.8  PROT 6.5 5.3*  ALBUMIN 3.5 2.8*   Recent Labs  Lab 03/28/19 1013  LIPASE 42   No results for input(s): AMMONIA in the last 168 hours. Coagulation Profile: No results for input(s): INR, PROTIME in the last 168 hours. Cardiac Enzymes: Recent Labs  Lab 03/28/19 1013 03/28/19 1339 03/29/19 0513 03/29/19 1348  TROPONINI 0.04* 0.10* 0.71* 0.46*   BNP (last 3 results) No results for input(s): PROBNP in the last 8760 hours. HbA1C: No results for input(s): HGBA1C in the last 72 hours. CBG: No results for input(s): GLUCAP in the last 168 hours. Lipid Profile: No results for input(s): CHOL, HDL, LDLCALC, TRIG, CHOLHDL, LDLDIRECT in the last 72 hours. Thyroid Function Tests: Recent Labs    03/28/19 1013  TSH 2.333   Anemia Panel: No results for input(s): VITAMINB12, FOLATE, FERRITIN, TIBC, IRON, RETICCTPCT in the last 72 hours.    Radiology Studies: I have  reviewed all of the imaging during this hospital visit personally     Scheduled Meds: . aspirin EC  81 mg Oral Daily  . calcium carbonate  1 tablet Oral BID WC  . cholecalciferol  1,000 Units Oral Daily  . cyclobenzaprine  5 mg Oral TID  . heparin  5,000 Units Subcutaneous Q8H  . levothyroxine  75 mcg Oral QAC breakfast  . methylPREDNISolone  2 mg Oral Daily  . metoprolol succinate  100 mg Oral Daily  . multivitamin  15 mL Oral Daily  . pantoprazole  40 mg Oral Daily  . sucralfate  1 g Oral TID WC & HS   Continuous Infusions:   LOS: 2 days        Cheryl Sahlin Gerome Apley, MD

## 2019-03-30 NOTE — Plan of Care (Signed)
  Problem: Education: Goal: Knowledge of General Education information will improve Description Including pain rating scale, medication(s)/side effects and non-pharmacologic comfort measures Outcome: Progressing   Problem: Health Behavior/Discharge Planning: Goal: Ability to manage health-related needs will improve Outcome: Progressing   

## 2019-03-31 MED ORDER — SUCRALFATE 1 GM/10ML PO SUSP: 1.0000 g | Freq: Three times a day (TID) | ORAL | 0 refills | Status: DC

## 2019-03-31 MED ORDER — AMLODIPINE BESYLATE 2.5 MG PO TABS: 2.5000 mg | ORAL_TABLET | Freq: Every day | ORAL | 0 refills | Status: DC

## 2019-03-31 NOTE — Progress Notes (Signed)
Patient refused her Calcium and multivitamin.  State it causes her to get an upset stomach

## 2019-03-31 NOTE — Progress Notes (Signed)
[place] choice offered, pt chose [facility] [place] agency, Dan with [facility] called for arrangements. Maia Breslow RN,MHA,BSN [phone]  [facility]  [phone]   [place] to my Favorites Quality of Patient Care Rating3 out of 5 stars Patient Survey Summary Rating4 out of [place]  [phone]   [place] to my [place]  out of 5 stars Patient Survey Summary Rating3 out of [place]  [phone]   [place] to my Favorites Quality of Patient Care Rating4 out of 5 stars Patient Survey Summary Rating4 out of 5 stars  [place]  [phone]   [place] to my Favorites Quality of Patient Care Rating4  out of 5 stars Patient Survey Summary Rating4 out of [place]  [phone]   [place] to my Favorites Quality of Patient Care Rating4 out of 5 stars Patient Survey Summary Rating4 out of 5 stars  [place]  [phone]   [place] to my Favorites Quality of Patient Care Rating4 out of 5 stars Patient Survey Summary Rating4 out of 5 stars  [facility] [place]  [phone]   [place] to my Favorites Quality of Patient Care Rating3  out of 5 stars Patient Survey Summary Rating4 out of [place]  [phone]   [place] to my Favorites Quality of Patient Care Rating3 out of 5 stars Patient Survey Summary Rating4 out of 5 stars  [facility]  [phone]) [phone]   Add [facility] to my Favorites Quality of Patient Care Rating4 out of 5 stars Not Available  [facility]  [phone]) [phone]   Add [facility] to my Favorites Quality of Patient Care  Rating3  out of 5 stars Patient Survey Summary Rating3 out of [place]  [phone]   [place] to my Favorites Quality of Patient Care Rating3  out of 5 stars Patient Survey Summary Rating4 out of [place]  [phone]   Add [facility] to my [place]  out of 5 stars Patient Survey Summary Rating3 out of [place]  [phone]   Add [place] to my Favorites Quality of Patient Care Rating3  out of [place]  [phone]

## 2019-03-31 NOTE — TOC Transition Note (Signed)
Transition of Care [facility]) - CM/SW Discharge Note   Patient Details  Name: [name] MRN: [id number] Date of Birth: 03-Dec-1931  Transition of Care [facility]) CM/SW Contact:  Royston Bake, RN Phone Number: 03/31/2019, 10:45 AM   Clinical Narrative:    Patient lives at home alone; supportive daughter; PCP: Lawerance Cruel, MD; has private insurance with Medicare; pharmacy of choice is [facility] and [facility] [employer]; DME - walker at home; [facility] choice offered, [facility] Dan with [facility] called for arrangements. DME - walker at home.   Final next level of care: [place] Barriers to Discharge: No Barriers Identified   Patient Goals and CMS Choice Patient states their goals for this hospitalization and ongoing recovery are:: to get stronger CMS Medicare.gov Compare Post Acute Care list provided to:: Patient Choice offered to / list presented to : Patient  Discharge Placement                       Discharge Plan and Services In-house Referral: NA Discharge Planning Services: CM Consult Post Acute Care Choice: NA          DME Arranged: N/A DME Agency: NA       HH Arranged: RN, PT, OT [place] Agency: [place] ([place]) Date HH Agency Contacted: 03/31/19 Time [place]: 1044 Representative spoke with at [place]: Hydrographic surveyor  Social Determinants of Health ([place]) Interventions     Readmission Risk Interventions No flowsheet data found.

## 2019-03-31 NOTE — Discharge Summary (Signed)
Physician Discharge Summary  [name] BSJ:[id number] DOB: Apr 08, 1932 DOA: 03/28/2019  PCP: Lawerance Cruel, MD  Admit date: 03/28/2019 Discharge date: 03/31/2019  Admitted From: Home  Disposition:  Home   Recommendations for Outpatient Follow-up and new medication changes:  1. Follow up with Dr. Harrington Challenger in 7 days.  2. Furosemide, potassium and lisinopril have been discontinued 3. Take furosemide only for signs of hypervolemia, weight gain 3 lbs in 24 H or 5 lbs in 7 days.  4. Patient has been placed on low dose amlodipine for blood pressure control.  5. Started on sucralfate for GERD for 15 days.   Home Health: yes   Equipment/Devices: has a walker at home    Discharge Condition: stable  CODE STATUS: full  Diet recommendation: heart healthy   Brief/Interim Summary: 85 year old female who presented with generalized weakness, persistent and worsening for the last 7 days, associated with poor oral intake,worsening reflux symptoms along with worsening arthritic/muscular pain. On her initial physical examination blood pressure is 136/78, heart rate 80, temperature 98, respiratory rate 16, oxygen saturation 90% on room air. She had mild pallor, dry mucous membranes, her lungs were clear to auscultation, heart S1-2 present regular, abdomen soft and trace lower extremity edema.Sodium 129, potassium 5.5, chloride 103, bicarb 14, glucose 83, BUN 63, creatinine 1.75, AST 126, ALT 56, troponin 0.04, 0.10, white count 7.3, hemoglobin 9.8, hematocrit 30.3, platelets 206. SARS Covid 19 negative,urinalysis specific gravity less than 1.005,red cells 6-10, white cells 6-10.  Patient was admitted to the hospital with a working diagnosis ofacutekidney injury, complicated by hyperkalemia, metabolic acidosis related to hypovolemia/overdiuresis.  1.  Acute kidney injury on chronic kidney disease stage IIIb with hyponatremia, hyperkalemia and non-gap metabolic acidosis.  Patient was admitted to the  medical ward, she was placed on remote telemetry monitor, received IV isotonic saline with improvement of her symptoms.  Her diuretics were discontinued along her potassium supplements.  She received sodium zirconium with improvement of her hyperkalemia.  Her discharge sodium is 140, potassium 4.8, bicarbonate 20, serum creatinine 0.95.  She was advised to discontinue furosemide, potassium supplements and lisinopril.  Peak creatinine reached 1.91 and K at 6.0.   2.  Diastolic heart failure, 7654 LV ejection fraction 55 to 60%.  Patient tolerated well IV fluids, patient was continued on metoprolol.  Discontinue lisinopril due to hyperkalemia and kidney injury.  Continue blood pressure control with low-dose amlodipine.  3.  Hypertension.  Initially her antihypertensive agents were held, then she was placed on amlodipine for blood pressure control with good toleration.  4.  Elevated liver enzymes.  Likely to liver hypoperfusion, resolved with supportive therapy with IV fluids.  5.  Troponin elevation.  Likely related to acute kidney injury, patient remained chest pain-free, ruled out for acute coronary syndrome.  Her peak troponin was 0.71.   6.  Dyspepsia and GERD.  Patient was continued on proton pump inhibitors, sucralfate was added toleration.  At discharge her symptoms have significantly improved.  7.  Hypothyroid.  Continue levothyroxine.  8.  Fibromyalgia.  She will continue methylprednisolone at her home dose.  She did receive cyclobenzaprine while hospitalized with good toleration.  Discharge Diagnoses:  Active Problems:   Hyponatremia   AKI (acute kidney injury) [facility])    Discharge Instructions   Allergies as of 03/31/2019      Reactions   Pertussis Vaccines Swelling   Sulfa Antibiotics Nausea Only   Amoxicillin Rash, Other (See Comments)   Has patient had a PCN  reaction causing immediate rash, facial/tongue/throat swelling, SOB or lightheadedness with hypotension: yes Has patient  had a PCN reaction causing severe rash involving mucus membranes or skin necrosis: no Has patient had a PCN reaction that required hospitalization no Has patient had a PCN reaction occurring within the last 10 years: yes If all of the above answers are "NO", then may proceed with Cephalosporin use.      Medication List    STOP taking these medications   furosemide 40 MG tablet Commonly known as:  LASIX   lisinopril 5 MG tablet Commonly known as:  ZESTRIL   potassium chloride SA 20 MEQ tablet Commonly known as:  Klor-Con M20     TAKE these medications   allopurinol 100 MG tablet Commonly known as:  ZYLOPRIM Take 100 mg by mouth daily.   amLODipine 2.5 MG tablet Commonly known as:  NORVASC Take 1 tablet (2.5 mg total) by mouth daily for 30 days.   aspirin EC 81 MG tablet Take 81 mg by mouth daily.   atorvastatin 40 MG tablet Commonly known as:  LIPITOR Take 40 mg by mouth daily.   CALTRATE 600 PO Take 600 mg by mouth 2 (two) times daily.   HYDROcodone-acetaminophen 5-325 MG tablet Commonly known as:  NORCO/VICODIN Take 1 tablet by mouth every 6 (six) hours as needed (For pain.).   levothyroxine 75 MCG tablet Commonly known as:  SYNTHROID Take 1 tablet (75 mcg total) by mouth daily.   methylPREDNISolone 2 MG tablet Commonly known as:  MEDROL Take 2 mg by mouth daily.   metoprolol succinate 100 MG 24 hr tablet Commonly known as:  TOPROL-XL Take 100 mg by mouth daily. Take with or immediately following a meal.   PriLOSEC OTC 20 MG tablet Generic drug:  omeprazole Take 20 mg by mouth 2 (two) times daily.   sucralfate 1 GM/10ML suspension Commonly known as:  CARAFATE Take 10 mLs (1 g total) by mouth 4 (four) times daily -  with meals and at bedtime for 15 days.   Vitamin D3 25 MCG (1000 UT) Caps Take 1,000 Units by mouth daily.       Allergies  Allergen Reactions  . Pertussis Vaccines Swelling  . Sulfa Antibiotics Nausea Only  . Amoxicillin Rash and  Other (See Comments)    Has patient had a PCN reaction causing immediate rash, facial/tongue/throat swelling, SOB or lightheadedness with hypotension: yes Has patient had a PCN reaction causing severe rash involving mucus membranes or skin necrosis: no Has patient had a PCN reaction that required hospitalization no Has patient had a PCN reaction occurring within the last 10 years: yes If all of the above answers are "NO", then may proceed with Cephalosporin use.     Consultations:     Procedures/Studies:  No results found.   Procedures:   Subjective: Patient is feeling well, close to her baseline, no nausea or vomiting, tolerating po well, no chest pain or dyspnea.   Discharge Exam: Vitals:   03/31/19 0432 03/31/19 0851  BP: (!) 133/55 (!) 127/53  Pulse: 70 86  Resp: 18   Temp: 98.2 F (36.8 C)   SpO2: 98% 100%   Vitals:   03/30/19 1127 03/30/19 1932 03/31/19 0432 03/31/19 0851  BP: (!) 142/73 (!) 135/55 (!) 133/55 (!) 127/53  Pulse: 93 78 70 86  Resp: 16 18 18    Temp: (!) 97.4 F (36.3 C) (!) 97.4 F (36.3 C) 98.2 F (36.8 C)   TempSrc: Oral Oral Oral  SpO2: 100% 98% 98% 100%  Weight:   44.6 kg   Height:        General: Not in pain or dyspnea.  Neurology: Awake and alert, non focal  E ENT: mild pallor, no icterus, oral mucosa moist Cardiovascular: No JVD. S1-S2 present, rhythmic, no gallops, rubs, or murmurs. Trace lower extremity edema. Pulmonary: vesicular breath sounds bilaterally, adequate air movement, no wheezing, rhonchi or rales. Gastrointestinal. Abdomen with no organomegaly, non tender, no rebound or guarding Skin. No rashes Musculoskeletal: no joint deformities   The results of significant diagnostics from this hospitalization (including imaging, microbiology, ancillary and laboratory) are listed below for reference.     Microbiology: Recent Results (from the past 240 hour(s))  Urine culture     Status: None   Collection Time: 03/28/19  10:06 AM  Result Value Ref Range Status   Specimen Description   Final    URINE, CATHETERIZED Performed at [facility], [address] [place], [place] [address]    Special Requests   Final    NONE Performed at [facility], [address] [place], [place] [address]    Culture   Final    NO GROWTH Performed at [facility], [place] [address], [place], [place] [address]    Report Status 03/29/2019 FINAL  Final  SARS Coronavirus 2 (Hosp order,Performed in [facility] lab via [employer] ID)     Status: None   Collection Time: 03/28/19  3:15 PM  Result Value Ref Range Status   SARS Coronavirus 2 ([employer] ID Now) NEGATIVE NEGATIVE Final    Comment: (NOTE) Interpretive Result Comment(s): COVID 19 Positive SARS CoV 2 target nucleic acids are DETECTED. The SARS CoV 2 RNA is generally detectable in upper and lower respiratory specimens during the acute phase of infection.  Positive results are indicative of active infection with SARS CoV 2.  Clinical correlation with patient history and other diagnostic information is necessary to determine patient infection status.  Positive results do not rule out bacterial infection or coinfection with other viruses. The expected result is Negative. COVID 19 Negative SARS CoV 2 target nucleic acids are NOT DETECTED. The SARS CoV 2 RNA is generally detectable in upper and lower respiratory specimens during the acute phase of infection.  Negative results do not preclude SARS CoV 2 infection, do not rule out coinfections with other pathogens, and should not be used as the sole basis for treatment or other patient management decisions.  Negative results must be combined with clinical  observations, patient history, and epidemiological information. The expected result is Negative. Invalid Presence or absence of SARS CoV 2 nucleic acids cannot be determined. Repeat testing was performed on the submitted specimen and  repeated Invalid results were obtained.  If clinically indicated, additional testing on a new specimen with an alternate test methodology [phone]) is advised.  The SARS CoV 2 RNA is generally detectable in upper and lower respiratory specimens during the acute phase of infection. The expected result is Negative. Fact Sheet for Patients:  GolfingFamily.no Fact Sheet for Healthcare Providers: https://www.hernandez-brewer.com/ This test is not yet approved or cleared by the [place] FDA and has been authorized for detection and/or diagnosis of SARS CoV 2 by FDA under an Emergency Use Authorization (EUA).  This EUA will remain in effect (meaning this test can be used) for the duration of the COVID19 d eclaration under Section 564(b)(1) of the Act, 21 U.S.C. section 4011587361 3(b)(1), unless the  authorization is terminated or revoked sooner. Performed at [facility], [address] [place], [place] [address]      Labs: BNP (last 3 results) Recent Labs    03/28/19 1013  BNP 193.7*   Basic Metabolic Panel: Recent Labs  Lab 03/28/19 1013 03/28/19 1339 03/28/19 1823 03/29/19 0513 03/30/19 0559  NA 128* 129*  --  138 140  K 6.0* 5.5*  --  4.8 4.8  CL 98 103  --  110 113*  CO2 17* 14*  --  17* 20*  GLUCOSE 98 83  --  93 85  BUN 69* 63*  --  50* 29*  CREATININE 1.91* 1.75* 1.69* 1.40* 0.95  CALCIUM 8.1* 7.5*  --  8.2* 8.1*  MG 2.5*  --   --   --   --    Liver Function Tests: Recent Labs  Lab 03/28/19 1013 03/29/19 0513  AST 126* 95*  ALT 56* 51*  ALKPHOS 53 50  BILITOT 0.5 0.8  PROT 6.5 5.3*  ALBUMIN 3.5 2.8*   Recent Labs  Lab 03/28/19 1013  LIPASE 42   No results for input(s): AMMONIA in the last 168 hours. CBC: Recent Labs  Lab 03/28/19 1013 03/28/19 1823 03/29/19 0513  WBC 7.3 8.2 8.8  NEUTROABS 4.9  --   --   HGB 9.8* 9.5* 9.4*  HCT 30.3* 29.0* 28.3*  MCV 98.7 97.3 97.3  PLT 206 206 206   Cardiac  Enzymes: Recent Labs  Lab 03/28/19 1013 03/28/19 1339 03/29/19 0513 03/29/19 1348  TROPONINI 0.04* 0.10* 0.71* 0.46*   BNP: Invalid input(s): POCBNP CBG: No results for input(s): GLUCAP in the last 168 hours. D-Dimer No results for input(s): DDIMER in the last 72 hours. Hgb A1c No results for input(s): HGBA1C in the last 72 hours. Lipid Profile No results for input(s): CHOL, HDL, LDLCALC, TRIG, CHOLHDL, LDLDIRECT in the last 72 hours. Thyroid function studies Recent Labs    03/28/19 1013  TSH 2.333   Anemia work up No results for input(s): VITAMINB12, FOLATE, FERRITIN, TIBC, IRON, RETICCTPCT in the last 72 hours. Urinalysis    Component Value Date/Time   COLORURINE YELLOW 03/28/2019 1006   APPEARANCEUR CLEAR 03/28/2019 1006   LABSPEC <1.005 (L) 03/28/2019 1006   PHURINE 6.0 03/28/2019 1006   GLUCOSEU NEGATIVE 03/28/2019 1006   HGBUR SMALL (A) 03/28/2019 1006   BILIRUBINUR NEGATIVE 03/28/2019 1006   KETONESUR 15 (A) 03/28/2019 1006   PROTEINUR NEGATIVE 03/28/2019 1006   UROBILINOGEN 0.2 10/18/2010 1650   NITRITE NEGATIVE 03/28/2019 1006   LEUKOCYTESUR SMALL (A) 03/28/2019 1006   Sepsis Labs Invalid input(s): PROCALCITONIN,  WBC,  LACTICIDVEN Microbiology Recent Results (from the past 240 hour(s))  Urine culture     Status: None   Collection Time: 03/28/19 10:06 AM  Result Value Ref Range Status   Specimen Description   Final    URINE, CATHETERIZED Performed at [facility], [address] [place], [place] [address]    Special Requests   Final    NONE Performed at [facility], [address] [place], [place] [address]    Culture   Final    NO GROWTH Performed at [facility], [place] [address], [place], [place] [address]    Report Status 03/29/2019 FINAL  Final  SARS Coronavirus 2 (Hosp order,Performed in [facility] lab via [employer] ID)     Status: None   Collection Time: 03/28/19  3:15 PM  Result Value Ref Range Status   SARS  Coronavirus 2 ([employer] ID Now) NEGATIVE NEGATIVE Final    Comment: (NOTE) Interpretive Result Comment(s): COVID 19 Positive SARS CoV 2 target nucleic acids are DETECTED. The SARS CoV 2 RNA is generally detectable in upper and lower respiratory specimens during the acute phase of infection.  Positive results are indicative of active infection with SARS CoV 2.  Clinical correlation with patient history and other diagnostic information is necessary to determine patient infection status.  Positive results do not rule out bacterial infection or coinfection with other viruses. The expected result is Negative. COVID 19 Negative SARS CoV 2 target nucleic acids are NOT DETECTED. The SARS CoV 2 RNA is generally detectable in upper and lower respiratory specimens during the acute phase of infection.  Negative results do not preclude SARS CoV 2 infection, do not rule out coinfections with other pathogens, and should not be used as the sole basis for treatment or other patient management decisions.  Negative results must be combined with clinical  observations, patient history, and epidemiological information. The expected result is Negative. Invalid Presence or absence of SARS CoV 2 nucleic acids cannot be determined. Repeat testing was performed on the submitted specimen and repeated Invalid results were obtained.  If clinically indicated, additional testing on a new specimen with an alternate test methodology [phone]) is advised.  The SARS CoV 2 RNA is generally detectable in upper and lower respiratory specimens during the acute phase of infection. The expected result is Negative. Fact Sheet for Patients:  GolfingFamily.no Fact Sheet for Healthcare Providers: https://www.hernandez-brewer.com/ This test is not yet approved or cleared by the [place] FDA and has been authorized for detection and/or diagnosis of SARS CoV 2 by FDA under an Emergency Use  Authorization (EUA).  This EUA will remain in effect (meaning this test can be used) for the duration of the COVID19 d eclaration under Section 564(b)(1) of the Act, 21 U.S.C. section [phone] 3(b)(1), unless the authorization is terminated or revoked sooner. Performed at [facility], [address], [place], [place] [address]      Time coordinating discharge: 45 minutes  SIGNED:   Tawni Millers, MD  [facility] 03/31/2019, 8:57 AM

## 2019-03-31 NOTE — Progress Notes (Signed)
Physical Therapy Treatment Patient Details Name: [name] MRN: [id number] DOB: 1932-03-19 Today's Date: 03/31/2019    History of Present Illness 85 yo admitted with progressive weakness with AKI, hyponatremia, hyperkalemia. PMhx: HTN, CHf, fibromyalgia, pacemaker, severe OA    PT Comments    Pt awaiting d/c home today, agreeable for gait and stair training before she goes. Pt is mod I from transfer from recliner and supervision for ambulation without AD for 450 feet. Pt is min guard for ascent/descent of 2 steps with L handed rail, pt requires increased effort to climb stairs. Upon return to room pt SaO2 100% on RA, however has 2/4 DoE. Pt to work with HHPT on balance and improving strength to return to PLOF.   Follow Up Recommendations  Home health PT     Equipment Recommendations  None recommended by PT       Precautions / Restrictions Precautions Precautions: Fall Restrictions Weight Bearing Restrictions: No    Mobility  Bed Mobility                  Transfers Overall transfer level: Modified independent               General transfer comment: up in chair on entry   Ambulation/Gait Ambulation/Gait assistance: Supervision Gait Distance (Feet): 450 Feet Assistive device: None Gait Pattern/deviations: Decreased stride length;Step-through pattern;Trunk flexed;Wide base of support Gait velocity: slowed Gait velocity interpretation: 1.31 - 2.62 ft/sec, indicative of limited community ambulator General Gait Details: slow, shuffling gait, with mild instability, however no overt LoB especially with initial gait, encouraged pt to utilize RW at least when she first gets up due to instability    Stairs Stairs: Yes Stairs assistance: Min guard Stair Management: One rail Left;Forwards Number of Stairs: 2 General stair comments: min guard for safety with ascent/descent of steps, increased time and effort        Balance Overall balance assessment: Needs  assistance   Sitting balance-Leahy Scale: Good       Standing balance-Leahy Scale: Good                              Cognition Arousal/Alertness: Awake/alert Behavior During Therapy: WFL for tasks assessed/performed Overall Cognitive Status: Within Functional Limits for tasks assessed                                           General Comments General comments (skin integrity, edema, etc.): Pt reports wanting to work with HHPT on balance      Pertinent Vitals/Pain Pain Assessment: Faces Faces Pain Scale: Hurts little more Pain Location: back  Pain Descriptors / Indicators: Aching;Grimacing Pain Intervention(s): Limited activity within patient's tolerance;Monitored during session;Repositioned           PT Goals (current goals can now be found in the care plan section) Acute Rehab PT Goals Patient Stated Goal: return home and garden PT Goal Formulation: With patient Time For Goal Achievement: 04/13/19 Potential to Achieve Goals: Good    Frequency    Min 3X/week      PT Plan Current plan remains appropriate       AM-PAC PT "6 Clicks" Mobility   Outcome Measure  Help needed turning from your back to your side while in a flat bed without using bedrails?: A Little Help needed moving from lying  on your back to sitting on the side of a flat bed without using bedrails?: None Help needed moving to and from a bed to a chair (including a wheelchair)?: A Little Help needed standing up from a chair using your arms (e.g., wheelchair or bedside chair)?: A Little Help needed to walk in hospital room?: A Little Help needed climbing 3-5 steps with a railing? : A Little 6 Click Score: 19    End of Session Equipment Utilized During Treatment: Gait belt Activity Tolerance: Patient tolerated treatment well Patient left: in chair;with call bell/phone within reach;with chair alarm set Nurse Communication: Mobility status PT Visit Diagnosis: Other  abnormalities of gait and mobility (R26.89);Muscle weakness (generalized) (M62.81)     Time: 9728-2060 PT Time Calculation (min) (ACUTE ONLY): 19 min  Charges:  $Gait Training: 8-22 mins                     Ravleen Ries B. Migdalia Dk PT, DPT Acute Rehabilitation Services Pager [phone] Office [phone]    [place] 03/31/2019, 1:59 PM

## 2019-03-31 NOTE — Progress Notes (Signed)
Discharge patient home with Daughter. Discussed discharge instructions with patient including medications and follow up appointments.  Instructions sent home with patient

## 2019-04-01 DIAGNOSIS — I130 Hypertensive heart and chronic kidney disease with heart failure and stage 1 through stage 4 chronic kidney disease, or unspecified chronic kidney disease: Secondary | ICD-10-CM | POA: Diagnosis not present

## 2019-04-01 DIAGNOSIS — N183 Chronic kidney disease, stage 3 (moderate): Secondary | ICD-10-CM | POA: Diagnosis not present

## 2019-04-01 DIAGNOSIS — E871 Hypo-osmolality and hyponatremia: Secondary | ICD-10-CM | POA: Diagnosis not present

## 2019-04-01 DIAGNOSIS — N179 Acute kidney failure, unspecified: Secondary | ICD-10-CM | POA: Diagnosis not present

## 2019-04-01 DIAGNOSIS — E860 Dehydration: Secondary | ICD-10-CM | POA: Diagnosis not present

## 2019-04-01 DIAGNOSIS — K219 Gastro-esophageal reflux disease without esophagitis: Secondary | ICD-10-CM | POA: Diagnosis not present

## 2019-04-01 DIAGNOSIS — I5030 Unspecified diastolic (congestive) heart failure: Secondary | ICD-10-CM | POA: Diagnosis not present

## 2019-04-01 DIAGNOSIS — E875 Hyperkalemia: Secondary | ICD-10-CM | POA: Diagnosis not present

## 2019-04-01 DIAGNOSIS — Z7982 Long term (current) use of aspirin: Secondary | ICD-10-CM | POA: Diagnosis not present

## 2019-04-01 DIAGNOSIS — Z950 Presence of cardiac pacemaker: Secondary | ICD-10-CM | POA: Diagnosis not present

## 2019-04-02 DIAGNOSIS — E875 Hyperkalemia: Secondary | ICD-10-CM | POA: Diagnosis not present

## 2019-04-02 DIAGNOSIS — E871 Hypo-osmolality and hyponatremia: Secondary | ICD-10-CM | POA: Diagnosis not present

## 2019-04-02 DIAGNOSIS — N179 Acute kidney failure, unspecified: Secondary | ICD-10-CM | POA: Diagnosis not present

## 2019-04-02 DIAGNOSIS — N183 Chronic kidney disease, stage 3 (moderate): Secondary | ICD-10-CM | POA: Diagnosis not present

## 2019-04-02 DIAGNOSIS — I130 Hypertensive heart and chronic kidney disease with heart failure and stage 1 through stage 4 chronic kidney disease, or unspecified chronic kidney disease: Secondary | ICD-10-CM | POA: Diagnosis not present

## 2019-04-02 DIAGNOSIS — I5030 Unspecified diastolic (congestive) heart failure: Secondary | ICD-10-CM | POA: Diagnosis not present

## 2019-04-07 DIAGNOSIS — E039 Hypothyroidism, unspecified: Secondary | ICD-10-CM | POA: Diagnosis not present

## 2019-04-07 DIAGNOSIS — D509 Iron deficiency anemia, unspecified: Secondary | ICD-10-CM | POA: Diagnosis not present

## 2019-04-07 DIAGNOSIS — E875 Hyperkalemia: Secondary | ICD-10-CM | POA: Diagnosis not present

## 2019-04-07 DIAGNOSIS — I5030 Unspecified diastolic (congestive) heart failure: Secondary | ICD-10-CM | POA: Diagnosis not present

## 2019-04-07 DIAGNOSIS — E871 Hypo-osmolality and hyponatremia: Secondary | ICD-10-CM | POA: Diagnosis not present

## 2019-04-07 DIAGNOSIS — R609 Edema, unspecified: Secondary | ICD-10-CM | POA: Diagnosis not present

## 2019-04-07 DIAGNOSIS — N183 Chronic kidney disease, stage 3 (moderate): Secondary | ICD-10-CM | POA: Diagnosis not present

## 2019-04-07 DIAGNOSIS — M810 Age-related osteoporosis without current pathological fracture: Secondary | ICD-10-CM | POA: Diagnosis not present

## 2019-04-07 DIAGNOSIS — I130 Hypertensive heart and chronic kidney disease with heart failure and stage 1 through stage 4 chronic kidney disease, or unspecified chronic kidney disease: Secondary | ICD-10-CM | POA: Diagnosis not present

## 2019-04-07 DIAGNOSIS — Z09 Encounter for follow-up examination after completed treatment for conditions other than malignant neoplasm: Secondary | ICD-10-CM | POA: Diagnosis not present

## 2019-04-07 DIAGNOSIS — N179 Acute kidney failure, unspecified: Secondary | ICD-10-CM | POA: Diagnosis not present

## 2019-04-07 DIAGNOSIS — E782 Mixed hyperlipidemia: Secondary | ICD-10-CM | POA: Diagnosis not present

## 2019-04-07 DIAGNOSIS — I10 Essential (primary) hypertension: Secondary | ICD-10-CM | POA: Diagnosis not present

## 2019-04-07 DIAGNOSIS — K219 Gastro-esophageal reflux disease without esophagitis: Secondary | ICD-10-CM | POA: Diagnosis not present

## 2019-04-09 DIAGNOSIS — N183 Chronic kidney disease, stage 3 (moderate): Secondary | ICD-10-CM | POA: Diagnosis not present

## 2019-04-09 DIAGNOSIS — N179 Acute kidney failure, unspecified: Secondary | ICD-10-CM | POA: Diagnosis not present

## 2019-04-09 DIAGNOSIS — I130 Hypertensive heart and chronic kidney disease with heart failure and stage 1 through stage 4 chronic kidney disease, or unspecified chronic kidney disease: Secondary | ICD-10-CM | POA: Diagnosis not present

## 2019-04-09 DIAGNOSIS — I5030 Unspecified diastolic (congestive) heart failure: Secondary | ICD-10-CM | POA: Diagnosis not present

## 2019-04-09 DIAGNOSIS — E871 Hypo-osmolality and hyponatremia: Secondary | ICD-10-CM | POA: Diagnosis not present

## 2019-04-09 DIAGNOSIS — E875 Hyperkalemia: Secondary | ICD-10-CM | POA: Diagnosis not present

## 2019-04-10 DIAGNOSIS — N183 Chronic kidney disease, stage 3 (moderate): Secondary | ICD-10-CM | POA: Diagnosis not present

## 2019-04-10 DIAGNOSIS — E875 Hyperkalemia: Secondary | ICD-10-CM | POA: Diagnosis not present

## 2019-04-10 DIAGNOSIS — N179 Acute kidney failure, unspecified: Secondary | ICD-10-CM | POA: Diagnosis not present

## 2019-04-10 DIAGNOSIS — I5030 Unspecified diastolic (congestive) heart failure: Secondary | ICD-10-CM | POA: Diagnosis not present

## 2019-04-10 DIAGNOSIS — I130 Hypertensive heart and chronic kidney disease with heart failure and stage 1 through stage 4 chronic kidney disease, or unspecified chronic kidney disease: Secondary | ICD-10-CM | POA: Diagnosis not present

## 2019-04-10 DIAGNOSIS — E871 Hypo-osmolality and hyponatremia: Secondary | ICD-10-CM | POA: Diagnosis not present

## 2019-04-14 ENCOUNTER — Encounter: Payer: Medicare Other | Admitting: *Deleted

## 2019-04-14 ENCOUNTER — Other Ambulatory Visit: Payer: Self-pay

## 2019-04-14 DIAGNOSIS — N183 Chronic kidney disease, stage 3 (moderate): Secondary | ICD-10-CM | POA: Diagnosis not present

## 2019-04-14 DIAGNOSIS — N179 Acute kidney failure, unspecified: Secondary | ICD-10-CM | POA: Diagnosis not present

## 2019-04-14 DIAGNOSIS — I5030 Unspecified diastolic (congestive) heart failure: Secondary | ICD-10-CM | POA: Diagnosis not present

## 2019-04-14 DIAGNOSIS — I130 Hypertensive heart and chronic kidney disease with heart failure and stage 1 through stage 4 chronic kidney disease, or unspecified chronic kidney disease: Secondary | ICD-10-CM | POA: Diagnosis not present

## 2019-04-14 DIAGNOSIS — E875 Hyperkalemia: Secondary | ICD-10-CM | POA: Diagnosis not present

## 2019-04-14 DIAGNOSIS — E871 Hypo-osmolality and hyponatremia: Secondary | ICD-10-CM | POA: Diagnosis not present

## 2019-04-15 ENCOUNTER — Telehealth: Payer: Self-pay

## 2019-04-15 NOTE — Telephone Encounter (Signed)
Unable to speak to patient to remind of missed remote transmission.  

## 2019-04-17 DIAGNOSIS — I130 Hypertensive heart and chronic kidney disease with heart failure and stage 1 through stage 4 chronic kidney disease, or unspecified chronic kidney disease: Secondary | ICD-10-CM | POA: Diagnosis not present

## 2019-04-17 DIAGNOSIS — E875 Hyperkalemia: Secondary | ICD-10-CM | POA: Diagnosis not present

## 2019-04-17 DIAGNOSIS — N183 Chronic kidney disease, stage 3 (moderate): Secondary | ICD-10-CM | POA: Diagnosis not present

## 2019-04-17 DIAGNOSIS — E871 Hypo-osmolality and hyponatremia: Secondary | ICD-10-CM | POA: Diagnosis not present

## 2019-04-17 DIAGNOSIS — I5030 Unspecified diastolic (congestive) heart failure: Secondary | ICD-10-CM | POA: Diagnosis not present

## 2019-04-17 DIAGNOSIS — N179 Acute kidney failure, unspecified: Secondary | ICD-10-CM | POA: Diagnosis not present

## 2019-04-21 ENCOUNTER — Other Ambulatory Visit: Payer: Self-pay

## 2019-04-21 ENCOUNTER — Emergency Department (HOSPITAL_BASED_OUTPATIENT_CLINIC_OR_DEPARTMENT_OTHER)
Admission: EM | Admit: 2019-04-21 | Discharge: 2019-04-21 | Disposition: A | Discharge status: Home / Self Care | Payer: Medicare Other | Attending: Emergency Medicine | Admitting: Emergency Medicine

## 2019-04-21 ENCOUNTER — Emergency Department (HOSPITAL_BASED_OUTPATIENT_CLINIC_OR_DEPARTMENT_OTHER): Payer: Medicare Other

## 2019-04-21 ENCOUNTER — Encounter (HOSPITAL_BASED_OUTPATIENT_CLINIC_OR_DEPARTMENT_OTHER): Payer: Self-pay | Admitting: Emergency Medicine

## 2019-04-21 DIAGNOSIS — R600 Localized edema: Secondary | ICD-10-CM | POA: Diagnosis not present

## 2019-04-21 DIAGNOSIS — R2240 Localized swelling, mass and lump, unspecified lower limb: Secondary | ICD-10-CM | POA: Diagnosis present

## 2019-04-21 DIAGNOSIS — N3000 Acute cystitis without hematuria: Secondary | ICD-10-CM | POA: Diagnosis not present

## 2019-04-21 DIAGNOSIS — I110 Hypertensive heart disease with heart failure: Secondary | ICD-10-CM | POA: Diagnosis not present

## 2019-04-21 DIAGNOSIS — M7989 Other specified soft tissue disorders: Secondary | ICD-10-CM | POA: Diagnosis not present

## 2019-04-21 DIAGNOSIS — E876 Hypokalemia: Secondary | ICD-10-CM | POA: Diagnosis not present

## 2019-04-21 DIAGNOSIS — I5022 Chronic systolic (congestive) heart failure: Secondary | ICD-10-CM | POA: Diagnosis not present

## 2019-04-21 DIAGNOSIS — E039 Hypothyroidism, unspecified: Secondary | ICD-10-CM | POA: Insufficient documentation

## 2019-04-21 DIAGNOSIS — R609 Edema, unspecified: Secondary | ICD-10-CM

## 2019-04-21 LAB — CBC WITH DIFFERENTIAL/PLATELET
Abs Immature Granulocytes: 0.07 10*3/uL (ref 0.00–0.07)
Basophils Absolute: 0.1 10*3/uL (ref 0.0–0.1)
Basophils Relative: 1 %
Eosinophils Absolute: 0.6 10*3/uL — ABNORMAL HIGH (ref 0.0–0.5)
Eosinophils Relative: 7 %
HCT: 27.3 % — ABNORMAL LOW (ref 36.0–46.0)
Hemoglobin: 8.8 g/dL — ABNORMAL LOW (ref 12.0–15.0)
Immature Granulocytes: 1 %
Lymphocytes Relative: 20 %
Lymphs Abs: 1.8 10*3/uL (ref 0.7–4.0)
MCH: 32.1 pg (ref 26.0–34.0)
MCHC: 32.2 g/dL (ref 30.0–36.0)
MCV: 99.6 fL (ref 80.0–100.0)
Monocytes Absolute: 1.1 10*3/uL — ABNORMAL HIGH (ref 0.1–1.0)
Monocytes Relative: 12 %
Neutro Abs: 5.2 10*3/uL (ref 1.7–7.7)
Neutrophils Relative %: 59 %
Platelets: 295 10*3/uL (ref 150–400)
RBC: 2.74 MIL/uL — ABNORMAL LOW (ref 3.87–5.11)
RDW: 14.4 % (ref 11.5–15.5)
WBC: 8.8 10*3/uL (ref 4.0–10.5)
nRBC: 0 % (ref 0.0–0.2)

## 2019-04-21 LAB — COMPREHENSIVE METABOLIC PANEL
ALT: 25 U/L (ref 0–44)
AST: 36 U/L (ref 15–41)
Albumin: 3.3 g/dL — ABNORMAL LOW (ref 3.5–5.0)
Alkaline Phosphatase: 76 U/L (ref 38–126)
Anion gap: 11 (ref 5–15)
BUN: 35 mg/dL — ABNORMAL HIGH (ref 8–23)
CO2: 20 mmol/L — ABNORMAL LOW (ref 22–32)
Calcium: 8.4 mg/dL — ABNORMAL LOW (ref 8.9–10.3)
Chloride: 94 mmol/L — ABNORMAL LOW (ref 98–111)
Creatinine, Ser: 0.98 mg/dL (ref 0.44–1.00)
GFR calc Af Amer: >60 mL/min (ref >60)
GFR calc non Af Amer: 53 mL/min — ABNORMAL LOW (ref >60)
Glucose, Bld: 92 mg/dL (ref 70–99)
Potassium: 4.6 mmol/L (ref 3.5–5.1)
Sodium: 125 mmol/L — ABNORMAL LOW (ref 135–145)
Total Bilirubin: 0.4 mg/dL (ref 0.3–1.2)
Total Protein: 6.3 g/dL — ABNORMAL LOW (ref 6.5–8.1)

## 2019-04-21 LAB — URINALYSIS, MICROSCOPIC (REFLEX): WBC, UA: >50 WBC/hpf (ref 0–5)

## 2019-04-21 LAB — URINALYSIS, ROUTINE W REFLEX MICROSCOPIC
Bilirubin Urine: NEGATIVE
Glucose, UA: NEGATIVE mg/dL
Ketones, ur: NEGATIVE mg/dL
Nitrite: NEGATIVE
Protein, ur: NEGATIVE mg/dL
Specific Gravity, Urine: <1.005 — ABNORMAL LOW (ref 1.005–1.030)
pH: 6 (ref 5.0–8.0)

## 2019-04-21 LAB — BRAIN NATRIURETIC PEPTIDE: B Natriuretic Peptide: 304.1 pg/mL — ABNORMAL HIGH (ref 0.0–100.0)

## 2019-04-21 LAB — TROPONIN I: Troponin I: <0.03 ng/mL (ref <0.03)

## 2019-04-21 MED ORDER — CEPHALEXIN 250 MG PO CAPS: 250.0000 mg | ORAL_CAPSULE | Freq: Two times a day (BID) | ORAL | 0 refills | Status: DC

## 2019-04-21 MED FILL — CEPHALEXIN 250 MG CAPSULE: 250 | 7 days supply | Qty: 14 | Fill #0

## 2019-04-21 NOTE — Discharge Instructions (Addendum)
Hold the norvasc.  Decrease your water intake, balanced fluids are OK.  Decrease the lasix as suggested by Dr Dolly Rias.  Follow up with your doctor as planned

## 2019-04-21 NOTE — ED Triage Notes (Signed)
Per daughter and pt, the pt was admitted on the beginning of the month for kidney failure, she was sent home on Lisinopril and since then her leg are getting progressively swollen and painful.

## 2019-04-21 NOTE — ED Provider Notes (Signed)
Lab results reviewed.  Hyponatremia noted. Pt has been increasing her water intake on top of the diuretics.  Will have her cut back on free water.  Decrease lasix dosing.  Edema could be related to the norvasc.  Will have pt hold that medication. Abx for uti.  Follow up with PCP as planned   Dorie Rank, MD 04/21/19 [phone]

## 2019-04-21 NOTE — ED Provider Notes (Signed)
Emergency Department Provider Note   I have reviewed the triage vital signs and the nursing notes.   HISTORY  Chief Complaint Leg Swelling   HPI [name] is a 85 y.o. female who presents emergency department today for leg swelling.  Patient was admitted to the hospital back in the beginning of the month for dehydration, acute kidney injury.  At that time she was discontinued off of her Lasix and changed to as needed.  They discontinued her potassium and lisinopril and switch her to amlodipine.  A couple weeks ago she started to have recurrent leg swelling.  She states this is progressively worsened over the last couple weeks.  She talked to her doctor about a week and a half ago and started Lasix back to 40 mg and subsequently increased it a few days ago to 60 mg to help with the swelling.  She states has not gotten any better.  She does not have any new shortness of breath or chest pain.  She has some persistent back pain is not new.  She has no abdominal symptoms.  She states that she has some vaginal itching intermittently but seems to be worse with urinating.  She has no actual urinary pain.  She has increased urination recently she has been drinking more fluids because she is told she was dehydrated.  No sick contacts.  No other associated symptoms.  No other medication changes.   No other associated or modifying symptoms.    Past Medical History:  Diagnosis Date  . Allergic rhinitis   . Anemia    iron deficient  . Aneurysm ([place])   . Anxiety   . Basal cell carcinoma   . Chronic anemia   . Chronic low back pain   . Colon polyps   . GERD (gastroesophageal reflux disease)   . Hiatal hernia   . HTN (hypertension)   . Hypercholesteremia   . Hyperlipidemia   . Hypothyroidism   . Insomnia   . Left heart failure ([place])   . Osteoporosis    S/P forteo treatment as of 02/2009  . Pacemaker   . Polymyalgia ([place])    rheumatica-steroids per Rheum  . Systolic heart failure      Patient Active Problem List   Diagnosis Date Noted  . Hyponatremia 03/28/2019  . AKI (acute kidney injury) ([place])   . Cardiomyopathy ([place]) 01/25/2014  . Mitral valve disorder 01/25/2014  . Combined hyperlipidemia 01/25/2014  . Biventricular implantable cardioverter-defibrillator in situ 04/24/2011  . Chronic systolic heart failure ([place]) 04/24/2011  . Hypertension, benign 04/24/2011  . Dyslipidemia 04/24/2011    Past Surgical History:  Procedure Laterality Date  . ABDOMINAL HYSTERECTOMY    . ANGIOPLASTY    . BACK SURGERY    . CARDIAC CATHETERIZATION  10/16/06  . CATARACT EXTRACTION    . defibulator implant    . EP IMPLANTABLE DEVICE N/A 04/10/2016   Procedure: BiV Pacemaker Insertion CRT-P;  Surgeon: Evans Lance, MD;  Location: [facility];  Service: Cardiovascular;  Laterality: N/A;  . FOOT SURGERY    . MITRAL VALVE REPAIR    . skin cancer removal    . THYROIDECTOMY    . TONSILLECTOMY      Current Outpatient Rx  . Order #: [id number] Class: Historical Med  . Order #: [id number] Class: Normal  . Order #: [id number] Class: Historical Med  . Order #: [id number] Class: Historical Med  . Order #: [id number] Class: Historical Med  . Order #: [id number] Class: Historical Med  .  Order #: [id number] Class: Historical Med  . Order #: [id number] Class: Normal  . Order #: [id number] Class: Historical Med  . Order #: [id number] Class: Historical Med  . Order #: [id number] Class: Historical Med  . Order #: [id number] Class: Normal    Allergies Pertussis vaccines; Sulfa antibiotics; and Amoxicillin  Family History  Problem Relation Age of Onset  . Stroke Mother        in her 32's  . Cancer Father 63       secondary  . Colon cancer Brother   . Colon cancer Sister   . Breast cancer Sister     Social History Social History   Tobacco Use  . Smoking status: Never Smoker  . Smokeless tobacco: Never Used  Substance Use Topics  . Alcohol use: No  . Drug use: No    Review of Systems   All other systems negative except as documented in the HPI. All pertinent positives and negatives as reviewed in the HPI. ____________________________________________   PHYSICAL EXAM:  VITAL SIGNS: ED Triage Vitals  Enc Vitals Group     BP 04/21/19 0556 (!) 171/49     Pulse Rate 04/21/19 0556 87     Resp 04/21/19 0556 18     Temp 04/21/19 0556 98.2 F (36.8 C)     Temp Source 04/21/19 0556 Oral     SpO2 04/21/19 0556 100 %     Weight 04/21/19 0557 98 lb 6.4 oz (44.6 kg)     Height 04/21/19 0557 4\' 8"  (1.422 m)     Head Circumference --      Peak Flow --      Pain Score 04/21/19 0557 8     Pain Loc --      Pain Edu? --      Excl. in [place]? --     Constitutional: Alert and oriented. Well appearing and in no acute distress. Eyes: Conjunctivae are normal. PERRL. EOMI. Head: Atraumatic. Nose: No congestion/rhinnorhea. Mouth/Throat: Mucous membranes are moist.  Oropharynx non-erythematous. Neck: No stridor.  No meningeal signs.   Cardiovascular: Normal rate, regular rhythm. Good peripheral circulation. Irregular heart sounds.   Respiratory: Normal respiratory effort.  No retractions. Lungs CTAB. Gastrointestinal: Soft and nontender. No distention.  Musculoskeletal: BLE edema with warmth and ttp. No gross deformities of extremities. Neurologic:  Normal speech and language. No gross focal neurologic deficits are appreciated.  Skin:  Skin is warm, dry and intact. No rash noted.  ____________________________________________   LABS (all labs ordered are listed, but only abnormal results are displayed)  Labs Reviewed  CBC WITH DIFFERENTIAL/PLATELET - Abnormal; Notable for the following components:      Result Value   RBC 2.74 (*)    Hemoglobin 8.8 (*)    HCT 27.3 (*)    Monocytes Absolute 1.1 (*)    Eosinophils Absolute 0.6 (*)    All other components within normal limits  COMPREHENSIVE METABOLIC PANEL  BRAIN NATRIURETIC PEPTIDE  TROPONIN I  URINALYSIS, ROUTINE W REFLEX  MICROSCOPIC   ____________________________________________  EKG   EKG Interpretation  Date/Time:  Tuesday Apr 21 2019 06:38:06 EDT Ventricular Rate:  81 PR Interval:    QRS Duration: 122 QT Interval:  423 QTC Calculation: 491 R Axis:   174 Text Interpretation:  Atrial-sensed ventricular-paced rhythm No further analysis attempted due to paced rhythm Baseline wander in lead(s) V5 V6 No significant change since last tracing Confirmed by Merrily Pew [phone]) on 04/21/2019 6:40:50 AM       ____________________________________________  RADIOLOGY  No results found.  ____________________________________________   PROCEDURES  Procedure(s) performed:   Procedures   ____________________________________________   INITIAL IMPRESSION / ASSESSMENT AND PLAN / ED COURSE  I suspect the patient probably is having side effect from amlodipine.  I do not think that Lasix is appropriate treatment for that she probably needs to stop it and switch to a different blood pressure medication.  She ready has an appointment with her PCP tomorrow.  However could be acute kidney injury again secondary to the fact that she did stop her Lasix recently and start back on it.  We will check her labs, BMP, troponin, urine chest x-ray and EKG.  If the stuff is all normal I would suggest stopping her amlodipine and start a new medication such as possibly a nitrate to help with the swelling.  Other possibilities would be clonidine.  Will defer to PCP to make this decision.  Something is abnormal in her labs and needs to be addressed in the hospital we will admit her however I think this will be unlikely.  Care transferred pending labs and reeval with likely discharge/dc norvasc/close PCP follow up.     Pertinent labs & imaging results that were available during my care of the patient were reviewed by me and considered in my medical decision making (see chart for details).    ____________________________________________  FINAL CLINICAL IMPRESSION(S) / ED DIAGNOSES  Final diagnoses:  None     MEDICATIONS GIVEN DURING THIS VISIT:  Medications - No data to display   NEW OUTPATIENT MEDICATIONS STARTED DURING THIS VISIT:  New Prescriptions   No medications on file    Note:  This note was prepared with assistance of Dragon voice recognition software. Occasional wrong-word or sound-a-like substitutions may have occurred due to the inherent limitations of voice recognition software.   Jacolby Risby, Corene Cornea, MD 04/29/19 0110

## 2019-04-21 NOTE — ED Notes (Signed)
ED Provider at bedside. Dr Tomi Bamberger

## 2019-04-22 DIAGNOSIS — R609 Edema, unspecified: Secondary | ICD-10-CM | POA: Diagnosis not present

## 2019-04-22 DIAGNOSIS — I509 Heart failure, unspecified: Secondary | ICD-10-CM | POA: Diagnosis not present

## 2019-04-22 DIAGNOSIS — I10 Essential (primary) hypertension: Secondary | ICD-10-CM | POA: Diagnosis not present

## 2019-04-22 DIAGNOSIS — R110 Nausea: Secondary | ICD-10-CM | POA: Diagnosis not present

## 2019-04-22 LAB — URINE CULTURE

## 2019-04-23 ENCOUNTER — Encounter: Payer: Self-pay | Admitting: Cardiology

## 2019-04-23 DIAGNOSIS — I5030 Unspecified diastolic (congestive) heart failure: Secondary | ICD-10-CM | POA: Diagnosis not present

## 2019-04-23 DIAGNOSIS — I130 Hypertensive heart and chronic kidney disease with heart failure and stage 1 through stage 4 chronic kidney disease, or unspecified chronic kidney disease: Secondary | ICD-10-CM | POA: Diagnosis not present

## 2019-04-23 DIAGNOSIS — E875 Hyperkalemia: Secondary | ICD-10-CM | POA: Diagnosis not present

## 2019-04-23 DIAGNOSIS — E871 Hypo-osmolality and hyponatremia: Secondary | ICD-10-CM | POA: Diagnosis not present

## 2019-04-23 DIAGNOSIS — N179 Acute kidney failure, unspecified: Secondary | ICD-10-CM | POA: Diagnosis not present

## 2019-04-23 DIAGNOSIS — N183 Chronic kidney disease, stage 3 (moderate): Secondary | ICD-10-CM | POA: Diagnosis not present

## 2019-04-29 DIAGNOSIS — N179 Acute kidney failure, unspecified: Secondary | ICD-10-CM | POA: Diagnosis not present

## 2019-04-29 DIAGNOSIS — I130 Hypertensive heart and chronic kidney disease with heart failure and stage 1 through stage 4 chronic kidney disease, or unspecified chronic kidney disease: Secondary | ICD-10-CM | POA: Diagnosis not present

## 2019-04-29 DIAGNOSIS — E871 Hypo-osmolality and hyponatremia: Secondary | ICD-10-CM | POA: Diagnosis not present

## 2019-04-29 DIAGNOSIS — N183 Chronic kidney disease, stage 3 (moderate): Secondary | ICD-10-CM | POA: Diagnosis not present

## 2019-04-29 DIAGNOSIS — I5030 Unspecified diastolic (congestive) heart failure: Secondary | ICD-10-CM | POA: Diagnosis not present

## 2019-04-29 DIAGNOSIS — E875 Hyperkalemia: Secondary | ICD-10-CM | POA: Diagnosis not present

## 2019-04-30 DIAGNOSIS — I130 Hypertensive heart and chronic kidney disease with heart failure and stage 1 through stage 4 chronic kidney disease, or unspecified chronic kidney disease: Secondary | ICD-10-CM | POA: Diagnosis not present

## 2019-04-30 DIAGNOSIS — N183 Chronic kidney disease, stage 3 (moderate): Secondary | ICD-10-CM | POA: Diagnosis not present

## 2019-04-30 DIAGNOSIS — I5030 Unspecified diastolic (congestive) heart failure: Secondary | ICD-10-CM | POA: Diagnosis not present

## 2019-04-30 DIAGNOSIS — N179 Acute kidney failure, unspecified: Secondary | ICD-10-CM | POA: Diagnosis not present

## 2019-04-30 DIAGNOSIS — E875 Hyperkalemia: Secondary | ICD-10-CM | POA: Diagnosis not present

## 2019-04-30 DIAGNOSIS — E871 Hypo-osmolality and hyponatremia: Secondary | ICD-10-CM | POA: Diagnosis not present

## 2019-05-01 DIAGNOSIS — I130 Hypertensive heart and chronic kidney disease with heart failure and stage 1 through stage 4 chronic kidney disease, or unspecified chronic kidney disease: Secondary | ICD-10-CM | POA: Diagnosis not present

## 2019-05-01 DIAGNOSIS — Z7982 Long term (current) use of aspirin: Secondary | ICD-10-CM | POA: Diagnosis not present

## 2019-05-01 DIAGNOSIS — N179 Acute kidney failure, unspecified: Secondary | ICD-10-CM | POA: Diagnosis not present

## 2019-05-01 DIAGNOSIS — N183 Chronic kidney disease, stage 3 (moderate): Secondary | ICD-10-CM | POA: Diagnosis not present

## 2019-05-01 DIAGNOSIS — E871 Hypo-osmolality and hyponatremia: Secondary | ICD-10-CM | POA: Diagnosis not present

## 2019-05-01 DIAGNOSIS — Z950 Presence of cardiac pacemaker: Secondary | ICD-10-CM | POA: Diagnosis not present

## 2019-05-01 DIAGNOSIS — E875 Hyperkalemia: Secondary | ICD-10-CM | POA: Diagnosis not present

## 2019-05-01 DIAGNOSIS — I5030 Unspecified diastolic (congestive) heart failure: Secondary | ICD-10-CM | POA: Diagnosis not present

## 2019-05-01 DIAGNOSIS — K219 Gastro-esophageal reflux disease without esophagitis: Secondary | ICD-10-CM | POA: Diagnosis not present

## 2019-05-01 DIAGNOSIS — E860 Dehydration: Secondary | ICD-10-CM | POA: Diagnosis not present

## 2019-05-05 ENCOUNTER — Ambulatory Visit (INDEPENDENT_AMBULATORY_CARE_PROVIDER_SITE_OTHER): Payer: Medicare Other | Admitting: *Deleted

## 2019-05-05 DIAGNOSIS — I429 Cardiomyopathy, unspecified: Secondary | ICD-10-CM

## 2019-05-05 DIAGNOSIS — I5022 Chronic systolic (congestive) heart failure: Secondary | ICD-10-CM

## 2019-05-05 LAB — CUP PACEART REMOTE DEVICE CHECK
Battery Remaining Longevity: 52 mo
Battery Voltage: 3 V
Brady Statistic AP VP Percent: 4.05 %
Brady Statistic AP VS Percent: 0.01 %
Brady Statistic AS VP Percent: 95.8 %
Brady Statistic AS VS Percent: 0.14 %
Brady Statistic RA Percent Paced: 4.05 %
Brady Statistic RV Percent Paced: 99.5 %
Date Time Interrogation Session: 20200609025054
Implantable Lead Implant Date: 20080612
Implantable Lead Implant Date: 20080612
Implantable Lead Implant Date: 20080612
Implantable Lead Location: 753858
Implantable Lead Location: 753859
Implantable Lead Location: 753860
Implantable Lead Model: 4194
Implantable Lead Model: 5076
Implantable Lead Model: 6947
Implantable Pulse Generator Implant Date: 20170516
Lead Channel Impedance Value: 266 Ohm
Lead Channel Impedance Value: 285 Ohm
Lead Channel Impedance Value: 342 Ohm
Lead Channel Impedance Value: 342 Ohm
Lead Channel Impedance Value: 342 Ohm
Lead Channel Impedance Value: 437 Ohm
Lead Channel Impedance Value: 494 Ohm
Lead Channel Impedance Value: 589 Ohm
Lead Channel Impedance Value: 703 Ohm
Lead Channel Pacing Threshold Amplitude: 0.625 V
Lead Channel Pacing Threshold Pulse Width: 0.4 ms
Lead Channel Sensing Intrinsic Amplitude: 13.25 mV
Lead Channel Sensing Intrinsic Amplitude: 2.5 mV
Lead Channel Setting Pacing Amplitude: 1.5 V
Lead Channel Setting Pacing Amplitude: 2 V
Lead Channel Setting Pacing Amplitude: 2.5 V
Lead Channel Setting Pacing Pulse Width: 0.4 ms
Lead Channel Setting Pacing Pulse Width: 0.8 ms
Lead Channel Setting Sensing Sensitivity: 2.8 mV

## 2019-05-07 DIAGNOSIS — I5030 Unspecified diastolic (congestive) heart failure: Secondary | ICD-10-CM | POA: Diagnosis not present

## 2019-05-07 DIAGNOSIS — N179 Acute kidney failure, unspecified: Secondary | ICD-10-CM | POA: Diagnosis not present

## 2019-05-07 DIAGNOSIS — I130 Hypertensive heart and chronic kidney disease with heart failure and stage 1 through stage 4 chronic kidney disease, or unspecified chronic kidney disease: Secondary | ICD-10-CM | POA: Diagnosis not present

## 2019-05-07 DIAGNOSIS — N183 Chronic kidney disease, stage 3 (moderate): Secondary | ICD-10-CM | POA: Diagnosis not present

## 2019-05-07 DIAGNOSIS — E871 Hypo-osmolality and hyponatremia: Secondary | ICD-10-CM | POA: Diagnosis not present

## 2019-05-07 DIAGNOSIS — E875 Hyperkalemia: Secondary | ICD-10-CM | POA: Diagnosis not present

## 2019-05-11 DIAGNOSIS — M5136 Other intervertebral disc degeneration, lumbar region: Secondary | ICD-10-CM | POA: Diagnosis not present

## 2019-05-11 DIAGNOSIS — R609 Edema, unspecified: Secondary | ICD-10-CM | POA: Diagnosis not present

## 2019-05-11 DIAGNOSIS — M1A9XX1 Chronic gout, unspecified, with tophus (tophi): Secondary | ICD-10-CM | POA: Diagnosis not present

## 2019-05-11 DIAGNOSIS — M8000XG Age-related osteoporosis with current pathological fracture, unspecified site, subsequent encounter for fracture with delayed healing: Secondary | ICD-10-CM | POA: Diagnosis not present

## 2019-05-11 DIAGNOSIS — M4850XA Collapsed vertebra, not elsewhere classified, site unspecified, initial encounter for fracture: Secondary | ICD-10-CM | POA: Diagnosis not present

## 2019-05-11 DIAGNOSIS — Z6821 Body mass index (BMI) 21.0-21.9, adult: Secondary | ICD-10-CM | POA: Diagnosis not present

## 2019-05-11 DIAGNOSIS — M353 Polymyalgia rheumatica: Secondary | ICD-10-CM | POA: Diagnosis not present

## 2019-05-11 DIAGNOSIS — E790 Hyperuricemia without signs of inflammatory arthritis and tophaceous disease: Secondary | ICD-10-CM | POA: Diagnosis not present

## 2019-05-13 NOTE — Progress Notes (Signed)
Remote pacemaker transmission.   

## 2019-05-15 DIAGNOSIS — N183 Chronic kidney disease, stage 3 (moderate): Secondary | ICD-10-CM | POA: Diagnosis not present

## 2019-05-15 DIAGNOSIS — I5030 Unspecified diastolic (congestive) heart failure: Secondary | ICD-10-CM | POA: Diagnosis not present

## 2019-05-15 DIAGNOSIS — E871 Hypo-osmolality and hyponatremia: Secondary | ICD-10-CM | POA: Diagnosis not present

## 2019-05-15 DIAGNOSIS — E875 Hyperkalemia: Secondary | ICD-10-CM | POA: Diagnosis not present

## 2019-05-15 DIAGNOSIS — I130 Hypertensive heart and chronic kidney disease with heart failure and stage 1 through stage 4 chronic kidney disease, or unspecified chronic kidney disease: Secondary | ICD-10-CM | POA: Diagnosis not present

## 2019-05-15 DIAGNOSIS — N179 Acute kidney failure, unspecified: Secondary | ICD-10-CM | POA: Diagnosis not present

## 2019-05-18 DIAGNOSIS — Z09 Encounter for follow-up examination after completed treatment for conditions other than malignant neoplasm: Secondary | ICD-10-CM | POA: Diagnosis not present

## 2019-05-18 DIAGNOSIS — E039 Hypothyroidism, unspecified: Secondary | ICD-10-CM | POA: Diagnosis not present

## 2019-05-18 DIAGNOSIS — R609 Edema, unspecified: Secondary | ICD-10-CM | POA: Diagnosis not present

## 2019-05-18 DIAGNOSIS — D509 Iron deficiency anemia, unspecified: Secondary | ICD-10-CM | POA: Diagnosis not present

## 2019-05-18 DIAGNOSIS — M810 Age-related osteoporosis without current pathological fracture: Secondary | ICD-10-CM | POA: Diagnosis not present

## 2019-05-18 DIAGNOSIS — E782 Mixed hyperlipidemia: Secondary | ICD-10-CM | POA: Diagnosis not present

## 2019-05-18 DIAGNOSIS — R110 Nausea: Secondary | ICD-10-CM | POA: Diagnosis not present

## 2019-05-18 DIAGNOSIS — E875 Hyperkalemia: Secondary | ICD-10-CM | POA: Diagnosis not present

## 2019-05-18 DIAGNOSIS — M1A9XX1 Chronic gout, unspecified, with tophus (tophi): Secondary | ICD-10-CM | POA: Diagnosis not present

## 2019-05-18 DIAGNOSIS — I509 Heart failure, unspecified: Secondary | ICD-10-CM | POA: Diagnosis not present

## 2019-05-18 DIAGNOSIS — I10 Essential (primary) hypertension: Secondary | ICD-10-CM | POA: Diagnosis not present

## 2019-05-19 ENCOUNTER — Telehealth: Payer: Self-pay | Admitting: *Deleted

## 2019-05-19 NOTE — Telephone Encounter (Signed)

## 2019-05-21 ENCOUNTER — Other Ambulatory Visit: Payer: Self-pay

## 2019-05-21 ENCOUNTER — Telehealth (INDEPENDENT_AMBULATORY_CARE_PROVIDER_SITE_OTHER): Payer: Medicare Other | Admitting: Internal Medicine

## 2019-05-21 DIAGNOSIS — I5022 Chronic systolic (congestive) heart failure: Secondary | ICD-10-CM | POA: Diagnosis not present

## 2019-05-21 NOTE — Progress Notes (Signed)
Electrophysiology TeleHealth Note   Due to national recommendations of social distancing due to COVID 19, an audio/video telehealth visit is felt to be most appropriate for this patient at this time.  See MyChart message from today for the patient's consent to telehealth for [facility].   Date:  05/21/2019   ID:  [name], DOB 1931-12-04, MRN [id number]  Location: patient's home  Provider location: [address] [place]  Evaluation Performed: Follow-up visit  PCP:  Lawerance Cruel, MD  Cardiologist:  No primary care provider on file.  Electrophysiologist:  Dr [name]  Chief Complaint:  "I was sick back in May."  History of Present Illness:    [name] is a 85 y.o. female who presents via audio/video conferencing for a telehealth visit today.  She has a long h/o chronic systolic heart failure, and then had normalization of her LV function. She was admitted with dehydration. She was repleted. She had some problems with peripheral edema but this got better when amlodipine was stopped. Her leg swelling is much improved. Since last being seen in our clinic, the patient reports doing very well.  The patient denies symptoms of fevers, chills, cough, or new SOB worrisome for COVID 19.  Past Medical History:  Diagnosis Date  . Allergic rhinitis   . Anemia    iron deficient  . Aneurysm ([place])   . Anxiety   . Basal cell carcinoma   . Chronic anemia   . Chronic low back pain   . Colon polyps   . GERD (gastroesophageal reflux disease)   . Hiatal hernia   . HTN (hypertension)   . Hypercholesteremia   . Hyperlipidemia   . Hypothyroidism   . Insomnia   . Left heart failure ([place])   . Osteoporosis    S/P forteo treatment as of 02/2009  . Pacemaker   . Polymyalgia ([place])    rheumatica-steroids per Rheum  . Systolic heart failure     Past Surgical History:  Procedure Laterality Date  . ABDOMINAL HYSTERECTOMY    . ANGIOPLASTY    . BACK SURGERY    .  CARDIAC CATHETERIZATION  10/16/06  . CATARACT EXTRACTION    . defibulator implant    . EP IMPLANTABLE DEVICE N/A 04/10/2016   Procedure: BiV Pacemaker Insertion CRT-P;  Surgeon: Evans Lance, MD;  Location: [facility];  Service: Cardiovascular;  Laterality: N/A;  . FOOT SURGERY    . MITRAL VALVE REPAIR    . skin cancer removal    . THYROIDECTOMY    . TONSILLECTOMY      Current Outpatient Medications  Medication Sig Dispense Refill  . allopurinol (ZYLOPRIM) 100 MG tablet Take 100 mg by mouth daily.    Marland Kitchen amLODipine (NORVASC) 2.5 MG tablet Take 1 tablet (2.5 mg total) by mouth daily for 30 days. 30 tablet 0  . aspirin EC 81 MG tablet Take 81 mg by mouth daily.    Marland Kitchen atorvastatin (LIPITOR) 40 MG tablet Take 40 mg by mouth daily.    . Calcium Carbonate (CALTRATE 600 PO) Take 600 mg by mouth 2 (two) times daily.     . cephALEXin (KEFLEX) 250 MG capsule Take 1 capsule (250 mg total) by mouth 2 (two) times daily. 14 capsule 0  . Cholecalciferol (VITAMIN D3) 1000 UNITS CAPS Take 1,000 Units by mouth daily.     Marland Kitchen HYDROcodone-acetaminophen (NORCO/VICODIN) 5-325 MG per tablet Take 1 tablet by mouth every 6 (  six) hours as needed (For pain.).   0  . levothyroxine (SYNTHROID, LEVOTHROID) 75 MCG tablet Take 1 tablet (75 mcg total) by mouth daily. 90 tablet 1  . methylPREDNISolone (MEDROL) 2 MG tablet Take 2 mg by mouth daily.     . metoprolol succinate (TOPROL-XL) 100 MG 24 hr tablet Take 100 mg by mouth daily. Take with or immediately following a meal.    . omeprazole (PRILOSEC OTC) 20 MG tablet Take 20 mg by mouth 2 (two) times daily.     . sucralfate (CARAFATE) 1 GM/10ML suspension Take 10 mLs (1 g total) by mouth 4 (four) times daily -  with meals and at bedtime for 15 days. 600 mL 0   No current facility-administered medications for this visit.     Allergies:   Pertussis vaccines, Sulfa antibiotics, and Amoxicillin   Social History:  The patient  reports that she has never smoked. She  has never used smokeless tobacco. She reports that she does not drink alcohol or use drugs.   Family History:  The patient's family history includes Breast cancer in her sister; Cancer (age of onset: 45) in her father; Colon cancer in her brother and sister; Stroke in her mother.   ROS:  Please see the history of present illness.   All other systems are personally reviewed and negative.    Exam:    Vital Signs:  BP - 150/60, P - 70   Labs/Other Tests and Data Reviewed:    Recent Labs: 03/28/2019: Magnesium 2.5; TSH 2.333 04/21/2019: ALT 25; B Natriuretic Peptide 304.1; BUN 35; Creatinine, Ser 0.98; Hemoglobin 8.8; Platelets 295; Potassium 4.6; Sodium 125   Wt Readings from Last 3 Encounters:  04/21/19 98 lb 6.4 oz (44.6 kg)  03/31/19 98 lb 6.4 oz (44.6 kg)  05/01/18 102 lb (46.3 kg)     Other studies personally reviewed:  Last device remote is reviewed from [place] PDF dated 05/05/19 which reveals normal device function, no arrhythmias    ASSESSMENT & PLAN:    1.  Chronic systolic heart failure - her symptoms are class 2. She is encouraged to maintain a low sodium diet.  2. Dehydration - this has resolved. She will continue her current meds. 3. Biv PPM - her symptoms are well controlled. Her device is functioning normally. 4. COVID 19 screen The patient denies symptoms of COVID 19 at this time.  The importance of social distancing was discussed today.  Follow-up:  6 months Next remote: 9/20  Current medicines are reviewed at length with the patient today.   The patient does not have concerns regarding her medicines.  The following changes were made today:  none  Labs/ tests ordered today include: none No orders of the defined types were placed in this encounter.    Patient Risk:  after full review of this patients clinical status, I feel that they are at moderate risk at this time.  Today, I have spent 15 minutes with the patient with telehealth technology discussing all  of the above .    Signed, Cristopher Peru, MD  05/21/2019 2:12 PM     [facility] [address] [place] [address] [phone] (office) [phone] (fax)

## 2019-05-25 DIAGNOSIS — D509 Iron deficiency anemia, unspecified: Secondary | ICD-10-CM | POA: Diagnosis not present

## 2019-05-25 DIAGNOSIS — I10 Essential (primary) hypertension: Secondary | ICD-10-CM | POA: Diagnosis not present

## 2019-05-25 DIAGNOSIS — I509 Heart failure, unspecified: Secondary | ICD-10-CM | POA: Diagnosis not present

## 2019-05-25 DIAGNOSIS — E782 Mixed hyperlipidemia: Secondary | ICD-10-CM | POA: Diagnosis not present

## 2019-05-25 DIAGNOSIS — E039 Hypothyroidism, unspecified: Secondary | ICD-10-CM | POA: Diagnosis not present

## 2019-05-25 DIAGNOSIS — Z0000 Encounter for general adult medical examination without abnormal findings: Secondary | ICD-10-CM | POA: Diagnosis not present

## 2019-05-25 DIAGNOSIS — N183 Chronic kidney disease, stage 3 (moderate): Secondary | ICD-10-CM | POA: Diagnosis not present

## 2019-05-25 DIAGNOSIS — M353 Polymyalgia rheumatica: Secondary | ICD-10-CM | POA: Diagnosis not present

## 2019-05-27 DIAGNOSIS — E871 Hypo-osmolality and hyponatremia: Secondary | ICD-10-CM | POA: Diagnosis not present

## 2019-05-27 DIAGNOSIS — N179 Acute kidney failure, unspecified: Secondary | ICD-10-CM | POA: Diagnosis not present

## 2019-05-27 DIAGNOSIS — N183 Chronic kidney disease, stage 3 (moderate): Secondary | ICD-10-CM | POA: Diagnosis not present

## 2019-05-27 DIAGNOSIS — I130 Hypertensive heart and chronic kidney disease with heart failure and stage 1 through stage 4 chronic kidney disease, or unspecified chronic kidney disease: Secondary | ICD-10-CM | POA: Diagnosis not present

## 2019-05-27 DIAGNOSIS — E875 Hyperkalemia: Secondary | ICD-10-CM | POA: Diagnosis not present

## 2019-05-27 DIAGNOSIS — I5030 Unspecified diastolic (congestive) heart failure: Secondary | ICD-10-CM | POA: Diagnosis not present

## 2019-07-23 DIAGNOSIS — M810 Age-related osteoporosis without current pathological fracture: Secondary | ICD-10-CM | POA: Diagnosis not present

## 2019-08-05 ENCOUNTER — Ambulatory Visit (INDEPENDENT_AMBULATORY_CARE_PROVIDER_SITE_OTHER): Payer: Medicare Other | Admitting: *Deleted

## 2019-08-05 DIAGNOSIS — I5022 Chronic systolic (congestive) heart failure: Secondary | ICD-10-CM

## 2019-08-05 DIAGNOSIS — I429 Cardiomyopathy, unspecified: Secondary | ICD-10-CM

## 2019-08-06 LAB — CUP PACEART REMOTE DEVICE CHECK
Battery Remaining Longevity: 52 mo
Battery Voltage: 3 V
Brady Statistic AP VP Percent: 4.83 %
Brady Statistic AP VS Percent: 0.01 %
Brady Statistic AS VP Percent: 95.04 %
Brady Statistic AS VS Percent: 0.12 %
Brady Statistic RA Percent Paced: 4.82 %
Brady Statistic RV Percent Paced: 99.52 %
Date Time Interrogation Session: 20200910103353
Implantable Lead Implant Date: 20080612
Implantable Lead Implant Date: 20080612
Implantable Lead Implant Date: 20080612
Implantable Lead Location: 753858
Implantable Lead Location: 753859
Implantable Lead Location: 753860
Implantable Lead Model: 4194
Implantable Lead Model: 5076
Implantable Lead Model: 6947
Implantable Pulse Generator Implant Date: 20170516
Lead Channel Impedance Value: 285 Ohm
Lead Channel Impedance Value: 304 Ohm
Lead Channel Impedance Value: 342 Ohm
Lead Channel Impedance Value: 361 Ohm
Lead Channel Impedance Value: 380 Ohm
Lead Channel Impedance Value: 494 Ohm
Lead Channel Impedance Value: 532 Ohm
Lead Channel Impedance Value: 646 Ohm
Lead Channel Impedance Value: 779 Ohm
Lead Channel Pacing Threshold Amplitude: 0.625 V
Lead Channel Pacing Threshold Amplitude: 1.5 V
Lead Channel Pacing Threshold Amplitude: 2.375 V
Lead Channel Pacing Threshold Pulse Width: 0.4 ms
Lead Channel Pacing Threshold Pulse Width: 0.4 ms
Lead Channel Pacing Threshold Pulse Width: 0.4 ms
Lead Channel Sensing Intrinsic Amplitude: 13 mV
Lead Channel Sensing Intrinsic Amplitude: 2.625 mV
Lead Channel Setting Pacing Amplitude: 1.5 V
Lead Channel Setting Pacing Amplitude: 2 V
Lead Channel Setting Pacing Amplitude: 2.5 V
Lead Channel Setting Pacing Pulse Width: 0.4 ms
Lead Channel Setting Pacing Pulse Width: 0.8 ms
Lead Channel Setting Sensing Sensitivity: 2.8 mV

## 2019-08-19 NOTE — Progress Notes (Signed)
Remote pacemaker transmission.   

## 2019-08-27 DIAGNOSIS — Z23 Encounter for immunization: Secondary | ICD-10-CM | POA: Diagnosis not present

## 2019-11-04 ENCOUNTER — Ambulatory Visit (INDEPENDENT_AMBULATORY_CARE_PROVIDER_SITE_OTHER): Payer: Medicare Other | Admitting: *Deleted

## 2019-11-04 DIAGNOSIS — I5022 Chronic systolic (congestive) heart failure: Secondary | ICD-10-CM

## 2019-11-05 LAB — CUP PACEART REMOTE DEVICE CHECK
Battery Remaining Longevity: 54 mo
Battery Voltage: 3 V
Brady Statistic AP VP Percent: 4.84 %
Brady Statistic AP VS Percent: 0.01 %
Brady Statistic AS VP Percent: 95.03 %
Brady Statistic AS VS Percent: 0.13 %
Brady Statistic RA Percent Paced: 4.84 %
Brady Statistic RV Percent Paced: 99.63 %
Date Time Interrogation Session: 20201210173507
Implantable Lead Implant Date: 20080612
Implantable Lead Implant Date: 20080612
Implantable Lead Implant Date: 20080612
Implantable Lead Location: 753858
Implantable Lead Location: 753859
Implantable Lead Location: 753860
Implantable Lead Model: 4194
Implantable Lead Model: 5076
Implantable Lead Model: 6947
Implantable Pulse Generator Implant Date: 20170516
Lead Channel Impedance Value: 285 Ohm
Lead Channel Impedance Value: 285 Ohm
Lead Channel Impedance Value: 342 Ohm
Lead Channel Impedance Value: 342 Ohm
Lead Channel Impedance Value: 361 Ohm
Lead Channel Impedance Value: 437 Ohm
Lead Channel Impedance Value: 513 Ohm
Lead Channel Impedance Value: 608 Ohm
Lead Channel Impedance Value: 703 Ohm
Lead Channel Pacing Threshold Amplitude: 0.625 V
Lead Channel Pacing Threshold Amplitude: 1.5 V
Lead Channel Pacing Threshold Amplitude: 2.375 V
Lead Channel Pacing Threshold Pulse Width: 0.4 ms
Lead Channel Pacing Threshold Pulse Width: 0.4 ms
Lead Channel Pacing Threshold Pulse Width: 0.4 ms
Lead Channel Sensing Intrinsic Amplitude: 13 mV
Lead Channel Sensing Intrinsic Amplitude: 13 mV
Lead Channel Sensing Intrinsic Amplitude: 2.375 mV
Lead Channel Sensing Intrinsic Amplitude: 2.375 mV
Lead Channel Setting Pacing Amplitude: 1.5 V
Lead Channel Setting Pacing Amplitude: 2 V
Lead Channel Setting Pacing Amplitude: 2.5 V
Lead Channel Setting Pacing Pulse Width: 0.4 ms
Lead Channel Setting Pacing Pulse Width: 0.8 ms
Lead Channel Setting Sensing Sensitivity: 2.8 mV

## 2019-11-10 DIAGNOSIS — M353 Polymyalgia rheumatica: Secondary | ICD-10-CM | POA: Diagnosis not present

## 2019-11-10 DIAGNOSIS — M1A9XX1 Chronic gout, unspecified, with tophus (tophi): Secondary | ICD-10-CM | POA: Diagnosis not present

## 2019-11-10 DIAGNOSIS — Z6821 Body mass index (BMI) 21.0-21.9, adult: Secondary | ICD-10-CM | POA: Diagnosis not present

## 2019-11-10 DIAGNOSIS — M4850XA Collapsed vertebra, not elsewhere classified, site unspecified, initial encounter for fracture: Secondary | ICD-10-CM | POA: Diagnosis not present

## 2019-11-10 DIAGNOSIS — M8000XG Age-related osteoporosis with current pathological fracture, unspecified site, subsequent encounter for fracture with delayed healing: Secondary | ICD-10-CM | POA: Diagnosis not present

## 2019-11-10 DIAGNOSIS — R609 Edema, unspecified: Secondary | ICD-10-CM | POA: Diagnosis not present

## 2019-11-10 DIAGNOSIS — E790 Hyperuricemia without signs of inflammatory arthritis and tophaceous disease: Secondary | ICD-10-CM | POA: Diagnosis not present

## 2019-11-10 DIAGNOSIS — M5136 Other intervertebral disc degeneration, lumbar region: Secondary | ICD-10-CM | POA: Diagnosis not present

## 2019-12-28 ENCOUNTER — Encounter: Payer: Medicare Other | Admitting: Internal Medicine

## 2019-12-31 ENCOUNTER — Ambulatory Visit (INDEPENDENT_AMBULATORY_CARE_PROVIDER_SITE_OTHER): Payer: Medicare Other | Admitting: Internal Medicine

## 2019-12-31 ENCOUNTER — Encounter: Payer: Self-pay | Admitting: Internal Medicine

## 2019-12-31 ENCOUNTER — Other Ambulatory Visit: Payer: Self-pay

## 2019-12-31 VITALS — BP 190/120 | HR 89 | Ht <= 58 in | Wt 96.6 lb

## 2019-12-31 DIAGNOSIS — Z95810 Presence of automatic (implantable) cardiac defibrillator: Secondary | ICD-10-CM

## 2019-12-31 DIAGNOSIS — I5022 Chronic systolic (congestive) heart failure: Secondary | ICD-10-CM

## 2019-12-31 NOTE — Progress Notes (Signed)
HPI [name] returns today for followup of her sinus node dysfunction, s/p PPM insertion. She has dyslipidemia and she denies dietary or medical non-compliance. She has never had syncope. She notes that at home her sbp is in the 120-130 range.  Allergies  Allergen Reactions  . Pertussis Vaccines Swelling  . Sulfa Antibiotics Nausea Only  . Amoxicillin Rash and Other (See Comments)    Has patient had a PCN reaction causing immediate rash, facial/tongue/throat swelling, SOB or lightheadedness with hypotension: yes Has patient had a PCN reaction causing severe rash involving mucus membranes or skin necrosis: no Has patient had a PCN reaction that required hospitalization no Has patient had a PCN reaction occurring within the last 10 years: yes If all of the above answers are "NO", then may proceed with Cephalosporin use.      Current Outpatient Medications  Medication Sig Dispense Refill  . allopurinol (ZYLOPRIM) 100 MG tablet Take 100 mg by mouth daily.    Marland Kitchen aspirin EC 81 MG tablet Take 81 mg by mouth daily.    Marland Kitchen atorvastatin (LIPITOR) 40 MG tablet Take 40 mg by mouth daily.    . Calcium Carbonate (CALTRATE 600 PO) Take 600 mg by mouth 2 (two) times daily.     . cephALEXin (KEFLEX) 250 MG capsule Take 1 capsule (250 mg total) by mouth 2 (two) times daily. 14 capsule 0  . Cholecalciferol (VITAMIN D3) 1000 UNITS CAPS Take 1,000 Units by mouth daily.     Marland Kitchen FERREX 150 150 MG capsule Take 1 capsule by mouth daily.    . furosemide (LASIX) 40 MG tablet Take 1 tablet by mouth daily.    Marland Kitchen HYDROcodone-acetaminophen (NORCO/VICODIN) 5-325 MG per tablet Take 1 tablet by mouth every 6 (six) hours as needed (For pain.).   0  . levothyroxine (SYNTHROID, LEVOTHROID) 75 MCG tablet Take 1 tablet (75 mcg total) by mouth daily. 90 tablet 1  . lisinopril (ZESTRIL) 10 MG tablet Take 1 tablet by mouth daily.    . methylPREDNISolone (MEDROL) 2 MG tablet Take 2 mg by mouth daily.     . metoprolol  succinate (TOPROL-XL) 100 MG 24 hr tablet Take 100 mg by mouth daily. Take with or immediately following a meal.    . omeprazole (PRILOSEC OTC) 20 MG tablet Take 20 mg by mouth 2 (two) times daily.     . potassium chloride SA (KLOR-CON) 20 MEQ tablet Take 1 tablet by mouth daily.    . sucralfate (CARAFATE) 1 GM/10ML suspension Take 10 mLs (1 g total) by mouth 4 (four) times daily -  with meals and at bedtime for 15 days. 600 mL 0   No current facility-administered medications for this visit.     Past Medical History:  Diagnosis Date  . Allergic rhinitis   . Anemia    iron deficient  . Aneurysm ([place])   . Anxiety   . Basal cell carcinoma   . Chronic anemia   . Chronic low back pain   . Colon polyps   . GERD (gastroesophageal reflux disease)   . Hiatal hernia   . HTN (hypertension)   . Hypercholesteremia   . Hyperlipidemia   . Hypothyroidism   . Insomnia   . Left heart failure ([place])   . Osteoporosis    S/P forteo treatment as of 02/2009  . Pacemaker   . Polymyalgia ([place])    rheumatica-steroids per Rheum  . Systolic heart failure     ROS:  All systems reviewed and negative except as noted in the HPI.   Past Surgical History:  Procedure Laterality Date  . ABDOMINAL HYSTERECTOMY    . ANGIOPLASTY    . BACK SURGERY    . CARDIAC CATHETERIZATION  10/16/06  . CATARACT EXTRACTION    . defibulator implant    . EP IMPLANTABLE DEVICE N/A 04/10/2016   Procedure: BiV Pacemaker Insertion CRT-P;  Surgeon: Evans Lance, MD;  Location: [facility];  Service: Cardiovascular;  Laterality: N/A;  . FOOT SURGERY    . MITRAL VALVE REPAIR    . skin cancer removal    . THYROIDECTOMY    . TONSILLECTOMY       Family History  Problem Relation Age of Onset  . Stroke Mother        in her 24's  . Cancer Father 80       secondary  . Colon cancer Brother   . Colon cancer Sister   . Breast cancer Sister      Social History   Socioeconomic History  . Marital status: Widowed     Spouse name: Not on file  . Number of children: Not on file  . Years of education: Not on file  . Highest education level: Not on file  Occupational History  . Not on file  Tobacco Use  . Smoking status: Never Smoker  . Smokeless tobacco: Never Used  Substance and Sexual Activity  . Alcohol use: No  . Drug use: No  . Sexual activity: Not on file  Other Topics Concern  . Not on file  Social History Narrative  . Not on file   Social Determinants of Health   Financial Resource Strain:   . Difficulty of Paying Living Expenses: Not on file  Food Insecurity:   . Worried About Charity fundraiser in the Last Year: Not on file  . Ran Out of Food in the Last Year: Not on file  Transportation Needs:   . Lack of Transportation (Medical): Not on file  . Lack of Transportation (Non-Medical): Not on file  Physical Activity:   . Days of Exercise per Week: Not on file  . Minutes of Exercise per Session: Not on file  Stress:   . Feeling of Stress : Not on file  Social Connections:   . Frequency of Communication with Friends and Family: Not on file  . Frequency of Social Gatherings with Friends and Family: Not on file  . Attends Religious Services: Not on file  . Active Member of Clubs or Organizations: Not on file  . Attends Archivist Meetings: Not on file  . Marital Status: Not on file  Intimate Partner Violence:   . Fear of Current or Ex-Partner: Not on file  . Emotionally Abused: Not on file  . Physically Abused: Not on file  . Sexually Abused: Not on file     BP (!) 190/120   Pulse 89   Ht 4\' 8"  (1.422 m)   Wt 96 lb 9.6 oz (43.8 kg)   SpO2 90%   BMI 21.66 kg/m   Physical Exam:  Well appearing NAD HEENT: Unremarkable Neck:  No JVD, no thyromegally Lymphatics:  No adenopathy Back:  No CVA tenderness Lungs:  Clear with no wheezes HEART:  Regular rate rhythm, no murmurs, no rubs, no clicks Abd:  soft, positive bowel sounds, no organomegally, no rebound,  no guarding Ext:  2 plus pulses, no edema, no cyanosis, no clubbing Skin:  No rashes no nodules Neuro:  CN II through XII intact, motor grossly intact  DEVICE  Normal device function.  See PaceArt for details.   Assess/Plan: 1. Chronic systolic heart failure - despite her bp being elevated, she is class 2. She will continue her current meds and reduce her sodium intake. 2. HTN - her bp was 180/80 on my exam. I have asked her to go home and take her p.m. meds and work harder to reduce her salt intake. She claims her bp is much better at home. 3. PPM - her medtronic biv PPM is working normally.  4. Arthritis - she has multiple compression fractures and back pain which I think is part of her problem.  Mikle Bosworth.D.

## 2019-12-31 NOTE — Patient Instructions (Signed)
Medication Instructions:  Your physician recommends that you continue on your current medications as directed. Please refer to the Current Medication list given to you today.  Labwork: None ordered.  Testing/Procedures: None ordered.  Follow-Up: Your physician wants you to follow-up in: one year with Dr. [name].   You will receive a reminder letter in the mail two months in advance. If you don't receive a letter, please call our office to schedule the follow-up appointment.  Remote monitoring is used to monitor your Pacemaker from home. This monitoring reduces the number of office visits required to check your device to one time per year. It allows [place] to keep an eye on the functioning of your device to ensure it is working properly. You are scheduled for a device check from home on 02/03/2020. You may send your transmission at any time that day. If you have a wireless device, the transmission will be sent automatically.   Any Other Special Instructions Will Be Listed Below (If Applicable).  If you need a refill on your cardiac medications before your next appointment, please call your pharmacy.

## 2020-01-25 DIAGNOSIS — M810 Age-related osteoporosis without current pathological fracture: Secondary | ICD-10-CM | POA: Diagnosis not present

## 2020-03-11 ENCOUNTER — Ambulatory Visit (INDEPENDENT_AMBULATORY_CARE_PROVIDER_SITE_OTHER): Payer: Medicare Other | Admitting: *Deleted

## 2020-03-11 DIAGNOSIS — I5022 Chronic systolic (congestive) heart failure: Secondary | ICD-10-CM | POA: Diagnosis not present

## 2020-03-11 LAB — CUP PACEART REMOTE DEVICE CHECK
Battery Remaining Longevity: 50 mo
Battery Voltage: 2.99 V
Brady Statistic AP VP Percent: 4.74 %
Brady Statistic AP VS Percent: 0.01 %
Brady Statistic AS VP Percent: 94.92 %
Brady Statistic AS VS Percent: 0.33 %
Brady Statistic RA Percent Paced: 4.75 %
Brady Statistic RV Percent Paced: 99.43 %
Date Time Interrogation Session: 20210416210654
Implantable Lead Implant Date: 20080612
Implantable Lead Implant Date: 20080612
Implantable Lead Implant Date: 20080612
Implantable Lead Location: 753858
Implantable Lead Location: 753859
Implantable Lead Location: 753860
Implantable Lead Model: 4194
Implantable Lead Model: 5076
Implantable Lead Model: 6947
Implantable Pulse Generator Implant Date: 20170516
Lead Channel Impedance Value: 266 Ohm
Lead Channel Impedance Value: 285 Ohm
Lead Channel Impedance Value: 323 Ohm
Lead Channel Impedance Value: 342 Ohm
Lead Channel Impedance Value: 342 Ohm
Lead Channel Impedance Value: 418 Ohm
Lead Channel Impedance Value: 494 Ohm
Lead Channel Impedance Value: 570 Ohm
Lead Channel Impedance Value: 703 Ohm
Lead Channel Pacing Threshold Amplitude: 0.625 V
Lead Channel Pacing Threshold Amplitude: 1.5 V
Lead Channel Pacing Threshold Amplitude: 2.375 V
Lead Channel Pacing Threshold Pulse Width: 0.4 ms
Lead Channel Pacing Threshold Pulse Width: 0.4 ms
Lead Channel Pacing Threshold Pulse Width: 0.4 ms
Lead Channel Sensing Intrinsic Amplitude: 2.375 mV
Lead Channel Sensing Intrinsic Amplitude: 2.375 mV
Lead Channel Sensing Intrinsic Amplitude: 8.125 mV
Lead Channel Sensing Intrinsic Amplitude: 8.125 mV
Lead Channel Setting Pacing Amplitude: 1.5 V
Lead Channel Setting Pacing Amplitude: 2 V
Lead Channel Setting Pacing Amplitude: 2.5 V
Lead Channel Setting Pacing Pulse Width: 0.4 ms
Lead Channel Setting Pacing Pulse Width: 0.8 ms
Lead Channel Setting Sensing Sensitivity: 2.8 mV

## 2020-03-11 NOTE — Progress Notes (Signed)
PPM Remote  

## 2020-03-24 DIAGNOSIS — E039 Hypothyroidism, unspecified: Secondary | ICD-10-CM | POA: Diagnosis not present

## 2020-03-24 DIAGNOSIS — I509 Heart failure, unspecified: Secondary | ICD-10-CM | POA: Diagnosis not present

## 2020-03-24 DIAGNOSIS — I10 Essential (primary) hypertension: Secondary | ICD-10-CM | POA: Diagnosis not present

## 2020-03-24 DIAGNOSIS — M810 Age-related osteoporosis without current pathological fracture: Secondary | ICD-10-CM | POA: Diagnosis not present

## 2020-03-24 DIAGNOSIS — D509 Iron deficiency anemia, unspecified: Secondary | ICD-10-CM | POA: Diagnosis not present

## 2020-03-24 DIAGNOSIS — E782 Mixed hyperlipidemia: Secondary | ICD-10-CM | POA: Diagnosis not present

## 2020-03-28 DIAGNOSIS — M546 Pain in thoracic spine: Secondary | ICD-10-CM | POA: Diagnosis not present

## 2020-03-28 DIAGNOSIS — I10 Essential (primary) hypertension: Secondary | ICD-10-CM | POA: Diagnosis not present

## 2020-03-28 DIAGNOSIS — Z79899 Other long term (current) drug therapy: Secondary | ICD-10-CM | POA: Diagnosis not present

## 2020-03-31 DIAGNOSIS — M810 Age-related osteoporosis without current pathological fracture: Secondary | ICD-10-CM | POA: Diagnosis not present

## 2020-03-31 DIAGNOSIS — I509 Heart failure, unspecified: Secondary | ICD-10-CM | POA: Diagnosis not present

## 2020-03-31 DIAGNOSIS — I10 Essential (primary) hypertension: Secondary | ICD-10-CM | POA: Diagnosis not present

## 2020-03-31 DIAGNOSIS — D509 Iron deficiency anemia, unspecified: Secondary | ICD-10-CM | POA: Diagnosis not present

## 2020-03-31 DIAGNOSIS — E782 Mixed hyperlipidemia: Secondary | ICD-10-CM | POA: Diagnosis not present

## 2020-03-31 DIAGNOSIS — E039 Hypothyroidism, unspecified: Secondary | ICD-10-CM | POA: Diagnosis not present

## 2020-05-10 DIAGNOSIS — M353 Polymyalgia rheumatica: Secondary | ICD-10-CM | POA: Diagnosis not present

## 2020-05-10 DIAGNOSIS — M5136 Other intervertebral disc degeneration, lumbar region: Secondary | ICD-10-CM | POA: Diagnosis not present

## 2020-05-10 DIAGNOSIS — M4850XA Collapsed vertebra, not elsewhere classified, site unspecified, initial encounter for fracture: Secondary | ICD-10-CM | POA: Diagnosis not present

## 2020-05-10 DIAGNOSIS — E790 Hyperuricemia without signs of inflammatory arthritis and tophaceous disease: Secondary | ICD-10-CM | POA: Diagnosis not present

## 2020-05-10 DIAGNOSIS — R609 Edema, unspecified: Secondary | ICD-10-CM | POA: Diagnosis not present

## 2020-05-10 DIAGNOSIS — M8000XG Age-related osteoporosis with current pathological fracture, unspecified site, subsequent encounter for fracture with delayed healing: Secondary | ICD-10-CM | POA: Diagnosis not present

## 2020-05-10 DIAGNOSIS — Z6821 Body mass index (BMI) 21.0-21.9, adult: Secondary | ICD-10-CM | POA: Diagnosis not present

## 2020-05-10 DIAGNOSIS — M1A9XX1 Chronic gout, unspecified, with tophus (tophi): Secondary | ICD-10-CM | POA: Diagnosis not present

## 2020-06-06 DIAGNOSIS — D72829 Elevated white blood cell count, unspecified: Secondary | ICD-10-CM | POA: Diagnosis not present

## 2020-06-10 ENCOUNTER — Ambulatory Visit (INDEPENDENT_AMBULATORY_CARE_PROVIDER_SITE_OTHER): Payer: Medicare Other | Admitting: *Deleted

## 2020-06-10 DIAGNOSIS — I429 Cardiomyopathy, unspecified: Secondary | ICD-10-CM | POA: Diagnosis not present

## 2020-06-11 LAB — CUP PACEART REMOTE DEVICE CHECK
Battery Remaining Longevity: 45 mo
Battery Voltage: 2.98 V
Brady Statistic AP VP Percent: 5.1 %
Brady Statistic AP VS Percent: 0.01 %
Brady Statistic AS VP Percent: 94.77 %
Brady Statistic AS VS Percent: 0.13 %
Brady Statistic RA Percent Paced: 5.1 %
Brady Statistic RV Percent Paced: 99.76 %
Date Time Interrogation Session: 20210716175437
Implantable Lead Implant Date: 20080612
Implantable Lead Implant Date: 20080612
Implantable Lead Implant Date: 20080612
Implantable Lead Location: 753858
Implantable Lead Location: 753859
Implantable Lead Location: 753860
Implantable Lead Model: 4194
Implantable Lead Model: 5076
Implantable Lead Model: 6947
Implantable Pulse Generator Implant Date: 20170516
Lead Channel Impedance Value: 285 Ohm
Lead Channel Impedance Value: 304 Ohm
Lead Channel Impedance Value: 342 Ohm
Lead Channel Impedance Value: 361 Ohm
Lead Channel Impedance Value: 380 Ohm
Lead Channel Impedance Value: 475 Ohm
Lead Channel Impedance Value: 589 Ohm
Lead Channel Impedance Value: 684 Ohm
Lead Channel Impedance Value: 798 Ohm
Lead Channel Pacing Threshold Amplitude: 0.5 V
Lead Channel Pacing Threshold Amplitude: 1.5 V
Lead Channel Pacing Threshold Amplitude: 2.375 V
Lead Channel Pacing Threshold Pulse Width: 0.4 ms
Lead Channel Pacing Threshold Pulse Width: 0.4 ms
Lead Channel Pacing Threshold Pulse Width: 0.4 ms
Lead Channel Sensing Intrinsic Amplitude: 2.125 mV
Lead Channel Sensing Intrinsic Amplitude: 2.125 mV
Lead Channel Sensing Intrinsic Amplitude: 8.125 mV
Lead Channel Sensing Intrinsic Amplitude: 8.125 mV
Lead Channel Setting Pacing Amplitude: 1.5 V
Lead Channel Setting Pacing Amplitude: 2 V
Lead Channel Setting Pacing Amplitude: 2.5 V
Lead Channel Setting Pacing Pulse Width: 0.4 ms
Lead Channel Setting Pacing Pulse Width: 0.8 ms
Lead Channel Setting Sensing Sensitivity: 2.8 mV

## 2020-06-13 NOTE — Progress Notes (Signed)
Remote pacemaker transmission.   

## 2020-07-27 DIAGNOSIS — M810 Age-related osteoporosis without current pathological fracture: Secondary | ICD-10-CM | POA: Diagnosis not present

## 2020-08-22 ENCOUNTER — Telehealth: Payer: Self-pay | Admitting: Student

## 2020-08-22 NOTE — Telephone Encounter (Signed)
  Unscheduled transmissions reviewed. Pt states she received a cellular adapter to connect to her monitor and sent the interrogations as the device kept turning on and telling her to.  Denies symptoms.  Of note, pt did have >2 hrs of AF on 8/25 noted on this transmission. Previous history of short AF reviewing chronic remotes (first noted 01/04/19). Not on [place]. Previous short episodes appear to have been considered SCAF.     CHA2DS2VASC of at least 63 (age, sex, HTN). She has NO h/o stroke or TIA.  Will forward to Dr. [name] for recommendations.  Legrand Como [address] [place], PA-C  08/22/2020 10:50 AM

## 2020-08-23 NOTE — Telephone Encounter (Signed)
Spoke with patient and given DR Taylor's recommendation to be scheduled in AF Clinic to discuss [facility] and management of AF. Patient requested that her daughter, [name] [facility]) be contacted to discuss the AF Clinic recommendation and to schedule the appointment.

## 2020-08-23 NOTE — Telephone Encounter (Signed)
Per Dr. Sherron Ales Pt f/u in [place] clinic to discuss [facility]

## 2020-08-24 NOTE — Telephone Encounter (Signed)
Follow up:     Patient daughter returning a call back from yesterday.

## 2020-08-25 NOTE — Telephone Encounter (Signed)
Called and left message for daughter, Naaman Plummer, to call back to schedule appt for patient.

## 2020-08-25 NOTE — Telephone Encounter (Signed)
Spoke with [name] ( daughter) concerning Dr Tanna Furry recommendation for patient to be followed in AF Clinic and discussing [place]. Daughter concerned about [place] due to medications her Mother is currently on. Informed that visit at [facility] would be to discuss risks and benefits of treatment options. [name] agreeable to patient being scheduled with AF Clinic and requested she be contacted to schedule appointment at her cell #: [phone].

## 2020-08-26 NOTE — Telephone Encounter (Signed)
Called and spoke with daughter, Naaman Plummer, she is agreeable to appt 08/31/20 with Adline Peals, PA.

## 2020-08-31 ENCOUNTER — Other Ambulatory Visit: Payer: Self-pay

## 2020-08-31 ENCOUNTER — Encounter (HOSPITAL_COMMUNITY): Payer: Self-pay | Admitting: Physician Assistant

## 2020-08-31 ENCOUNTER — Ambulatory Visit (HOSPITAL_COMMUNITY)
Admission: RE | Admit: 2020-08-31 | Discharge: 2020-08-31 | Disposition: A | Discharge status: Home / Self Care | Payer: Medicare Other | Source: Ambulatory Visit | Attending: Physician Assistant | Admitting: Physician Assistant

## 2020-08-31 VITALS — BP 160/72 | HR 77 | Ht <= 58 in | Wt 97.4 lb

## 2020-08-31 DIAGNOSIS — Z888 Allergy status to other drugs, medicaments and biological substances status: Secondary | ICD-10-CM | POA: Insufficient documentation

## 2020-08-31 DIAGNOSIS — E7800 Pure hypercholesterolemia, unspecified: Secondary | ICD-10-CM | POA: Diagnosis not present

## 2020-08-31 DIAGNOSIS — Z882 Allergy status to sulfonamides status: Secondary | ICD-10-CM | POA: Insufficient documentation

## 2020-08-31 DIAGNOSIS — Z79899 Other long term (current) drug therapy: Secondary | ICD-10-CM | POA: Diagnosis not present

## 2020-08-31 DIAGNOSIS — D6869 Other thrombophilia: Secondary | ICD-10-CM | POA: Insufficient documentation

## 2020-08-31 DIAGNOSIS — Z79890 Hormone replacement therapy (postmenopausal): Secondary | ICD-10-CM | POA: Insufficient documentation

## 2020-08-31 DIAGNOSIS — Z880 Allergy status to penicillin: Secondary | ICD-10-CM | POA: Insufficient documentation

## 2020-08-31 DIAGNOSIS — Z85828 Personal history of other malignant neoplasm of skin: Secondary | ICD-10-CM | POA: Diagnosis not present

## 2020-08-31 DIAGNOSIS — I10 Essential (primary) hypertension: Secondary | ICD-10-CM | POA: Diagnosis not present

## 2020-08-31 DIAGNOSIS — J309 Allergic rhinitis, unspecified: Secondary | ICD-10-CM | POA: Diagnosis not present

## 2020-08-31 DIAGNOSIS — K219 Gastro-esophageal reflux disease without esophagitis: Secondary | ICD-10-CM | POA: Insufficient documentation

## 2020-08-31 DIAGNOSIS — I480 Paroxysmal atrial fibrillation: Secondary | ICD-10-CM | POA: Diagnosis not present

## 2020-08-31 DIAGNOSIS — E785 Hyperlipidemia, unspecified: Secondary | ICD-10-CM | POA: Diagnosis not present

## 2020-08-31 DIAGNOSIS — Z7982 Long term (current) use of aspirin: Secondary | ICD-10-CM | POA: Insufficient documentation

## 2020-08-31 MED ORDER — APIXABAN 2.5 MG PO TABS: 2.5000 mg | ORAL_TABLET | Freq: Two times a day (BID) | ORAL | 1 refills | Status: DC

## 2020-08-31 NOTE — Progress Notes (Signed)
Primary Care Physician: Lawerance Cruel, MD Primary Electrophysiologist: Dr [name] Referring Physician: Dr Bunnie Philips is a 86 y.o. female with a history of NICM s/p CRT-P, MVR, HLD, HTN, and paroxysmal atrial fibrillation who presents for follow up in the [facility].  The patient was initially diagnosed with atrial fibrillation remotely with very brief episodes detected on device interrogation. These were thought to be SCAF and anticoagulation was not initiated at that time. On a device transmission 08/22/20, she was noted to have a > 2 hour episode of afib and she was referred to the AF clinic to discuss [place]. Patient has a CHADS2VASC score of 5. Patient was unaware of her arrhythmia. There were no specific triggers that she could identify.   Today, she denies symptoms of palpitations, chest pain, shortness of breath, orthopnea, PND, lower extremity edema, dizziness, presyncope, syncope, snoring, daytime somnolence, bleeding, or neurologic sequela. The patient is tolerating medications without difficulties and is otherwise without complaint today.    Atrial Fibrillation Risk Factors:  she does not have symptoms or diagnosis of sleep apnea. she does not have a history of rheumatic fever.   she has a BMI of Body mass index is 21.84 kg/m.Marland Kitchen Filed Weights   08/31/20 1042  Weight: 44.2 kg    Family History  Problem Relation Age of Onset  . Stroke Mother        in her 39's  . Cancer Father 90       secondary  . Colon cancer Brother   . Colon cancer Sister   . Breast cancer Sister      Atrial Fibrillation Management history:  Previous antiarrhythmic drugs: none Previous cardioversions: none Previous ablations: none CHADS2VASC score: 5 Anticoagulation history: none   Past Medical History:  Diagnosis Date  . Allergic rhinitis   . Anemia    iron deficient  . Aneurysm ([place])   . Anxiety   . Basal cell carcinoma   . Chronic anemia    . Chronic low back pain   . Colon polyps   . GERD (gastroesophageal reflux disease)   . Hiatal hernia   . HTN (hypertension)   . Hypercholesteremia   . Hyperlipidemia   . Hypothyroidism   . Insomnia   . Left heart failure ([place])   . Osteoporosis    S/P forteo treatment as of 02/2009  . Pacemaker   . Polymyalgia ([place])    rheumatica-steroids per Rheum  . Systolic heart failure    Past Surgical History:  Procedure Laterality Date  . ABDOMINAL HYSTERECTOMY    . ANGIOPLASTY    . BACK SURGERY    . CARDIAC CATHETERIZATION  10/16/06  . CATARACT EXTRACTION    . defibulator implant    . EP IMPLANTABLE DEVICE N/A 04/10/2016   Procedure: BiV Pacemaker Insertion CRT-P;  Surgeon: Evans Lance, MD;  Location: [facility];  Service: Cardiovascular;  Laterality: N/A;  . FOOT SURGERY    . MITRAL VALVE REPAIR    . skin cancer removal    . THYROIDECTOMY    . TONSILLECTOMY      Current Outpatient Medications  Medication Sig Dispense Refill  . allopurinol (ZYLOPRIM) 100 MG tablet Take 100 mg by mouth daily.    Marland Kitchen aspirin EC 81 MG tablet Take 81 mg by mouth daily.    Marland Kitchen atorvastatin (LIPITOR) 40 MG tablet Take 40 mg by mouth daily.    . Cholecalciferol (VITAMIN D3) 1000  UNITS CAPS Take 1,000 Units by mouth daily.     . furosemide (LASIX) 40 MG tablet Take 1 tablet by mouth daily.    Marland Kitchen HYDROcodone-acetaminophen (NORCO/VICODIN) 5-325 MG per tablet Take 1 tablet by mouth every 6 (six) hours as needed (For pain.).   0  . levothyroxine (SYNTHROID, LEVOTHROID) 75 MCG tablet Take 1 tablet (75 mcg total) by mouth daily. 90 tablet 1  . lisinopril (ZESTRIL) 10 MG tablet Take 1 tablet by mouth daily.    . methylPREDNISolone (MEDROL) 2 MG tablet Take 2 mg by mouth daily.     . metoprolol succinate (TOPROL-XL) 100 MG 24 hr tablet Take 100 mg by mouth daily. Take with or immediately following a meal.    . omeprazole (PRILOSEC OTC) 20 MG tablet Take 20 mg by mouth 2 (two) times daily.     . potassium  chloride SA (KLOR-CON) 20 MEQ tablet Take 1 tablet by mouth daily.    . sucralfate (CARAFATE) 1 GM/10ML suspension Take 10 mLs (1 g total) by mouth 4 (four) times daily -  with meals and at bedtime for 15 days. 600 mL 0   No current facility-administered medications for this encounter.    Allergies  Allergen Reactions  . Pertussis Vaccines Swelling  . Sulfa Antibiotics Nausea Only  . Amoxicillin Rash and Other (See Comments)    Has patient had a PCN reaction causing immediate rash, facial/tongue/throat swelling, SOB or lightheadedness with hypotension: yes Has patient had a PCN reaction causing severe rash involving mucus membranes or skin necrosis: no Has patient had a PCN reaction that required hospitalization no Has patient had a PCN reaction occurring within the last 10 years: yes If all of the above answers are "NO", then may proceed with Cephalosporin use.     Social History   Socioeconomic History  . Marital status: Widowed    Spouse name: Not on file  . Number of children: Not on file  . Years of education: Not on file  . Highest education level: Not on file  Occupational History  . Not on file  Tobacco Use  . Smoking status: Never Smoker  . Smokeless tobacco: Never Used  Vaping Use  . Vaping Use: Never used  Substance and Sexual Activity  . Alcohol use: No  . Drug use: No  . Sexual activity: Not on file  Other Topics Concern  . Not on file  Social History Narrative  . Not on file   Social Determinants of Health   Financial Resource Strain:   . Difficulty of Paying Living Expenses: Not on file  Food Insecurity:   . Worried About Charity fundraiser in the Last Year: Not on file  . Ran Out of Food in the Last Year: Not on file  Transportation Needs:   . Lack of Transportation (Medical): Not on file  . Lack of Transportation (Non-Medical): Not on file  Physical Activity:   . Days of Exercise per Week: Not on file  . Minutes of Exercise per Session: Not on  file  Stress:   . Feeling of Stress : Not on file  Social Connections:   . Frequency of Communication with Friends and Family: Not on file  . Frequency of Social Gatherings with Friends and Family: Not on file  . Attends Religious Services: Not on file  . Active Member of Clubs or Organizations: Not on file  . Attends Archivist Meetings: Not on file  . Marital Status: Not on  file  Intimate Partner Violence:   . Fear of Current or Ex-Partner: Not on file  . Emotionally Abused: Not on file  . Physically Abused: Not on file  . Sexually Abused: Not on file     ROS- All systems are reviewed and negative except as per the HPI above.  Physical Exam: Vitals:   08/31/20 1042  BP: (!) 160/72  Pulse: 77  Weight: 44.2 kg  Height: 4\' 8"  (1.422 m)    GEN- The patient is well appearing elderly female, alert and oriented x 3 today.   Head- normocephalic, atraumatic Eyes-  Sclera clear, conjunctiva pink Ears- hearing intact Oropharynx- clear Neck- supple  Lungs- Clear to ausculation bilaterally, normal work of breathing Heart- Regular rate and rhythm, no murmurs, rubs or gallops  GI- soft, NT, ND, + BS Extremities- no clubbing, cyanosis, or edema MS- no significant deformity or atrophy Skin- no rash or lesion Psych- euthymic mood, full affect Neuro- strength and sensation are intact  Wt Readings from Last 3 Encounters:  08/31/20 44.2 kg  12/31/19 43.8 kg  04/21/19 44.6 kg    EKG today demonstrates A sense V paced rhythm HR 77, PR 198, QRS 130, QTc 448  Echo 02/21/16 demonstrated  - Left ventricle: The cavity size was normal. Wall thickness was  normal. Systolic function was normal. The estimated ejection  fraction was in the range of 55% to 60%. Septal bounce consistent  with prior cardiac surgery. Wall motion was normal; there were no  regional wall motion abnormalities. Features are consistent with  a pseudonormal left ventricular filling pattern, with  concomitant  abnormal relaxation and increased filling pressure (grade 2  diastolic dysfunction).  - Aortic valve: There was no stenosis.  - Mitral valve: Status post mitral valve repair. No significant  stenosis. There was trivial regurgitation.  - Left atrium: The atrium was mildly dilated.  - Right ventricle: The cavity size was normal. Pacer wire or  catheter noted in right ventricle. Systolic function was mildly  reduced.  - Tricuspid valve: Peak RV-RA gradient (S): 27 mm Hg.  - Pulmonary arteries: PA peak pressure: 30 mm Hg (S).  - Inferior vena cava: The vessel was normal in size. The  respirophasic diameter changes were in the normal range (= 50%),  consistent with normal central venous pressure.   Impressions:   - Normal LV size with EF 55-60%. Septal bounce consistent with  prior cardiac surgery. Normal RV size with mildly decreased  systolic function. S/p mitral valve repair with trivial MR and no  significant stenosis.   Epic records are reviewed at length today  CHA2DS2-VASc Score = 5  The patient's score is based upon: CHF History: 1 HTN History: 1 Age : 2 Diabetes History: 0 Stroke History: 0 Vascular Disease History: 0 Gender: 1      ASSESSMENT AND PLAN: 1. Paroxysmal Atrial Fibrillation (ICD10:  I48.0) The patient's CHA2DS2-VASc score is 5, indicating a 7.2% annual risk of stroke.   General education about afib provided and questions answered. We also discussed her stroke risk and the risks and benefits of anticoagulation. Will plan to stop ASA and start Eliquis 2.5 mg BID (age, weight) Will check bmet/CBC Continue Toprol 100 mg daily  2. Secondary Hypercoagulable State (ICD10:  D68.69) The patient is at significant risk for stroke/thromboembolism based upon her CHA2DS2-VASc Score of 5.  Start Apixaban (Eliquis).   3. H/o NICM S/p CRT-P EF normalized on echo 2017. No signs or symptoms of fluid overload.  4. HTN Mildly elevated  today, she reports good BP control at home. No changes today.   Follow up in the AF clinic in one month.    [facility] [address] [place], [place] [address] [phone] 08/31/2020 10:56 AM

## 2020-08-31 NOTE — Patient Instructions (Signed)
Stop aspirin ? ?Start Eliquis 2.5mg twice a day ?

## 2020-09-09 ENCOUNTER — Ambulatory Visit (INDEPENDENT_AMBULATORY_CARE_PROVIDER_SITE_OTHER): Payer: Medicare Other

## 2020-09-09 DIAGNOSIS — I429 Cardiomyopathy, unspecified: Secondary | ICD-10-CM | POA: Diagnosis not present

## 2020-09-13 LAB — CUP PACEART REMOTE DEVICE CHECK
Battery Remaining Longevity: 40 mo
Battery Voltage: 2.98 V
Brady Statistic AP VP Percent: 1.98 %
Brady Statistic AP VS Percent: 0 %
Brady Statistic AS VP Percent: 97.88 %
Brady Statistic AS VS Percent: 0.13 %
Brady Statistic RA Percent Paced: 1.99 %
Brady Statistic RV Percent Paced: 99.78 %
Date Time Interrogation Session: 20211018190418
Implantable Lead Implant Date: 20080612
Implantable Lead Implant Date: 20080612
Implantable Lead Implant Date: 20080612
Implantable Lead Location: 753858
Implantable Lead Location: 753859
Implantable Lead Location: 753860
Implantable Lead Model: 4194
Implantable Lead Model: 5076
Implantable Lead Model: 6947
Implantable Pulse Generator Implant Date: 20170516
Lead Channel Impedance Value: 285 Ohm
Lead Channel Impedance Value: 304 Ohm
Lead Channel Impedance Value: 342 Ohm
Lead Channel Impedance Value: 342 Ohm
Lead Channel Impedance Value: 380 Ohm
Lead Channel Impedance Value: 437 Ohm
Lead Channel Impedance Value: 494 Ohm
Lead Channel Impedance Value: 608 Ohm
Lead Channel Impedance Value: 703 Ohm
Lead Channel Pacing Threshold Amplitude: 0.5 V
Lead Channel Pacing Threshold Amplitude: 1.5 V
Lead Channel Pacing Threshold Amplitude: 2.375 V
Lead Channel Pacing Threshold Pulse Width: 0.4 ms
Lead Channel Pacing Threshold Pulse Width: 0.4 ms
Lead Channel Pacing Threshold Pulse Width: 0.4 ms
Lead Channel Sensing Intrinsic Amplitude: 2.375 mV
Lead Channel Sensing Intrinsic Amplitude: 2.375 mV
Lead Channel Sensing Intrinsic Amplitude: 8.125 mV
Lead Channel Sensing Intrinsic Amplitude: 8.125 mV
Lead Channel Setting Pacing Amplitude: 1.5 V
Lead Channel Setting Pacing Amplitude: 2 V
Lead Channel Setting Pacing Amplitude: 2.5 V
Lead Channel Setting Pacing Pulse Width: 0.4 ms
Lead Channel Setting Pacing Pulse Width: 0.8 ms
Lead Channel Setting Sensing Sensitivity: 2.8 mV

## 2020-09-14 NOTE — Progress Notes (Signed)
Remote pacemaker transmission.   

## 2020-09-16 DIAGNOSIS — Z23 Encounter for immunization: Secondary | ICD-10-CM | POA: Diagnosis not present

## 2020-10-03 ENCOUNTER — Other Ambulatory Visit: Payer: Self-pay

## 2020-10-03 ENCOUNTER — Ambulatory Visit (HOSPITAL_COMMUNITY)
Admission: RE | Admit: 2020-10-03 | Discharge: 2020-10-03 | Disposition: A | Discharge status: Home / Self Care | Payer: Medicare Other | Source: Ambulatory Visit | Attending: Physician Assistant | Admitting: Physician Assistant

## 2020-10-03 VITALS — BP 168/80 | HR 75 | Ht <= 58 in | Wt 99.0 lb

## 2020-10-03 DIAGNOSIS — I428 Other cardiomyopathies: Secondary | ICD-10-CM | POA: Diagnosis not present

## 2020-10-03 DIAGNOSIS — Z79890 Hormone replacement therapy (postmenopausal): Secondary | ICD-10-CM | POA: Insufficient documentation

## 2020-10-03 DIAGNOSIS — Z79899 Other long term (current) drug therapy: Secondary | ICD-10-CM | POA: Diagnosis not present

## 2020-10-03 DIAGNOSIS — I10 Essential (primary) hypertension: Secondary | ICD-10-CM | POA: Diagnosis not present

## 2020-10-03 DIAGNOSIS — Z823 Family history of stroke: Secondary | ICD-10-CM | POA: Diagnosis not present

## 2020-10-03 DIAGNOSIS — Z7901 Long term (current) use of anticoagulants: Secondary | ICD-10-CM | POA: Diagnosis not present

## 2020-10-03 DIAGNOSIS — D6869 Other thrombophilia: Secondary | ICD-10-CM

## 2020-10-03 DIAGNOSIS — Z950 Presence of cardiac pacemaker: Secondary | ICD-10-CM | POA: Diagnosis not present

## 2020-10-03 DIAGNOSIS — I480 Paroxysmal atrial fibrillation: Secondary | ICD-10-CM | POA: Diagnosis not present

## 2020-10-03 LAB — CBC
HCT: 31 % — ABNORMAL LOW (ref 36.0–46.0)
Hemoglobin: 9.7 g/dL — ABNORMAL LOW (ref 12.0–15.0)
MCH: 32.4 pg (ref 26.0–34.0)
MCHC: 31.3 g/dL (ref 30.0–36.0)
MCV: 103.7 fL — ABNORMAL HIGH (ref 80.0–100.0)
Platelets: 251 10*3/uL (ref 150–400)
RBC: 2.99 MIL/uL — ABNORMAL LOW (ref 3.87–5.11)
RDW: 14.1 % (ref 11.5–15.5)
WBC: 9.1 10*3/uL (ref 4.0–10.5)
nRBC: 0 % (ref 0.0–0.2)

## 2020-10-03 LAB — BASIC METABOLIC PANEL
Anion gap: 9 (ref 5–15)
BUN: 31 mg/dL — ABNORMAL HIGH (ref 8–23)
CO2: 26 mmol/L (ref 22–32)
Calcium: 9.2 mg/dL (ref 8.9–10.3)
Chloride: 105 mmol/L (ref 98–111)
Creatinine, Ser: 1.14 mg/dL — ABNORMAL HIGH (ref 0.44–1.00)
GFR, Estimated: 47 mL/min — ABNORMAL LOW (ref >60)
Glucose, Bld: 98 mg/dL (ref 70–99)
Potassium: 4.4 mmol/L (ref 3.5–5.1)
Sodium: 140 mmol/L (ref 135–145)

## 2020-10-03 MED ORDER — LISINOPRIL 20 MG PO TABS: 20.0000 mg | ORAL_TABLET | Freq: Every day | ORAL | 3 refills | Status: DC

## 2020-10-03 NOTE — Patient Instructions (Signed)
Increase lisinopril to 20mg  once a day

## 2020-10-03 NOTE — Progress Notes (Signed)
Primary Care Physician: Lawerance Cruel, MD Primary Electrophysiologist: Dr [name] Referring Physician: Dr Bunnie Philips is a 86 y.o. female with a history of NICM s/p CRT-P, MVR, HLD, HTN, and paroxysmal atrial fibrillation who presents for follow up in the [facility].  The patient was initially diagnosed with atrial fibrillation remotely with very brief episodes detected on device interrogation. These were thought to be SCAF and anticoagulation was not initiated at that time. On a device transmission 08/22/20, she was noted to have a > 2 hour episode of afib and she was referred to the AF clinic to discuss [place]. Patient has a CHADS2VASC score of 5. Patient was unaware of her arrhythmia. There were no specific triggers that she could identify.   On follow up today, patient reports she has done well since her last visit. She has not had any heart racing symptoms. She denies any bleeding issues since starting anticoagulation. Patient reports her BP has been running 465K-354S systolic at home recently.   Today, she denies symptoms of palpitations, chest pain, shortness of breath, orthopnea, PND, lower extremity edema, dizziness, presyncope, syncope, snoring, daytime somnolence, bleeding, or neurologic sequela. The patient is tolerating medications without difficulties and is otherwise without complaint today.    Atrial Fibrillation Risk Factors:  she does not have symptoms or diagnosis of sleep apnea. she does not have a history of rheumatic fever.   she has a BMI of Body mass index is 22.2 kg/m.Marland Kitchen Filed Weights   10/03/20 1052  Weight: 44.9 kg    Family History  Problem Relation Age of Onset  . Stroke Mother        in her 80's  . Cancer Father 7       secondary  . Colon cancer Brother   . Colon cancer Sister   . Breast cancer Sister      Atrial Fibrillation Management history:  Previous antiarrhythmic drugs: none Previous  cardioversions: none Previous ablations: none CHADS2VASC score: 5 Anticoagulation history: Eliquis   Past Medical History:  Diagnosis Date  . Allergic rhinitis   . Anemia    iron deficient  . Aneurysm ([place])   . Anxiety   . Basal cell carcinoma   . Chronic anemia   . Chronic low back pain   . Colon polyps   . GERD (gastroesophageal reflux disease)   . Hiatal hernia   . HTN (hypertension)   . Hypercholesteremia   . Hyperlipidemia   . Hypothyroidism   . Insomnia   . Left heart failure ([place])   . Osteoporosis    S/P forteo treatment as of 02/2009  . Pacemaker   . Polymyalgia ([place])    rheumatica-steroids per Rheum  . Systolic heart failure    Past Surgical History:  Procedure Laterality Date  . ABDOMINAL HYSTERECTOMY    . ANGIOPLASTY    . BACK SURGERY    . CARDIAC CATHETERIZATION  10/16/06  . CATARACT EXTRACTION    . defibulator implant    . EP IMPLANTABLE DEVICE N/A 04/10/2016   Procedure: BiV Pacemaker Insertion CRT-P;  Surgeon: Evans Lance, MD;  Location: [facility];  Service: Cardiovascular;  Laterality: N/A;  . FOOT SURGERY    . MITRAL VALVE REPAIR    . skin cancer removal    . THYROIDECTOMY    . TONSILLECTOMY      Current Outpatient Medications  Medication Sig Dispense Refill  . allopurinol (ZYLOPRIM) 100 MG tablet  Take 100 mg by mouth daily.    Marland Kitchen apixaban (ELIQUIS) 2.5 MG TABS tablet Take 1 tablet (2.5 mg total) by mouth 2 (two) times daily. 180 tablet 1  . atorvastatin (LIPITOR) 40 MG tablet Take 40 mg by mouth daily.    . Cholecalciferol (VITAMIN D3) 1000 UNITS CAPS Take 1,000 Units by mouth daily.     . furosemide (LASIX) 40 MG tablet Take 1 tablet by mouth daily.    Marland Kitchen HYDROcodone-acetaminophen (NORCO/VICODIN) 5-325 MG per tablet Take 1 tablet by mouth every 6 (six) hours as needed (For pain.).   0  . levothyroxine (SYNTHROID, LEVOTHROID) 75 MCG tablet Take 1 tablet (75 mcg total) by mouth daily. 90 tablet 1  . lisinopril (ZESTRIL) 20 MG tablet  Take 1 tablet (20 mg total) by mouth daily. 30 tablet 3  . methylPREDNISolone (MEDROL) 2 MG tablet Take 2 mg by mouth daily.     . metoprolol succinate (TOPROL-XL) 100 MG 24 hr tablet Take 100 mg by mouth daily. Take with or immediately following a meal.    . omeprazole (PRILOSEC OTC) 20 MG tablet Take 20 mg by mouth 2 (two) times daily.     . potassium chloride SA (KLOR-CON) 20 MEQ tablet Take 1 tablet by mouth daily.     No current facility-administered medications for this encounter.    Allergies  Allergen Reactions  . Pertussis Vaccines Swelling  . Sulfa Antibiotics Nausea Only  . Amoxicillin Rash and Other (See Comments)    Has patient had a PCN reaction causing immediate rash, facial/tongue/throat swelling, SOB or lightheadedness with hypotension: yes Has patient had a PCN reaction causing severe rash involving mucus membranes or skin necrosis: no Has patient had a PCN reaction that required hospitalization no Has patient had a PCN reaction occurring within the last 10 years: yes If all of the above answers are "NO", then may proceed with Cephalosporin use.     Social History   Socioeconomic History  . Marital status: Widowed    Spouse name: Not on file  . Number of children: Not on file  . Years of education: Not on file  . Highest education level: Not on file  Occupational History  . Not on file  Tobacco Use  . Smoking status: Never Smoker  . Smokeless tobacco: Never Used  Vaping Use  . Vaping Use: Never used  Substance and Sexual Activity  . Alcohol use: No  . Drug use: No  . Sexual activity: Not on file  Other Topics Concern  . Not on file  Social History Narrative  . Not on file   Social Determinants of Health   Financial Resource Strain:   . Difficulty of Paying Living Expenses: Not on file  Food Insecurity:   . Worried About Charity fundraiser in the Last Year: Not on file  . Ran Out of Food in the Last Year: Not on file  Transportation Needs:   .  Lack of Transportation (Medical): Not on file  . Lack of Transportation (Non-Medical): Not on file  Physical Activity:   . Days of Exercise per Week: Not on file  . Minutes of Exercise per Session: Not on file  Stress:   . Feeling of Stress : Not on file  Social Connections:   . Frequency of Communication with Friends and Family: Not on file  . Frequency of Social Gatherings with Friends and Family: Not on file  . Attends Religious Services: Not on file  . Active  Member of Clubs or Organizations: Not on file  . Attends Archivist Meetings: Not on file  . Marital Status: Not on file  Intimate Partner Violence:   . Fear of Current or Ex-Partner: Not on file  . Emotionally Abused: Not on file  . Physically Abused: Not on file  . Sexually Abused: Not on file     ROS- All systems are reviewed and negative except as per the HPI above.  Physical Exam: Vitals:   10/03/20 1052  BP: (!) 168/80  Pulse: 75  Weight: 44.9 kg  Height: 4\' 8"  (1.422 m)    GEN- The patient is well appearing elderly female, alert and oriented x 3 today.   HEENT-head normocephalic, atraumatic, sclera clear, conjunctiva pink, hearing intact, trachea midline. Lungs- Clear to ausculation bilaterally, normal work of breathing Heart- Regular rate and rhythm, no murmurs, rubs or gallops  GI- soft, NT, ND, + BS Extremities- no clubbing, cyanosis, or edema MS- no significant deformity or atrophy Skin- no rash or lesion Psych- euthymic mood, full affect Neuro- strength and sensation are intact   Wt Readings from Last 3 Encounters:  10/03/20 44.9 kg  08/31/20 44.2 kg  12/31/19 43.8 kg    EKG today demonstrates SR HR 86, IVCD, PR 186, QRS 124, QTc 482  Echo 02/21/16 demonstrated  - Left ventricle: The cavity size was normal. Wall thickness was  normal. Systolic function was normal. The estimated ejection  fraction was in the range of 55% to 60%. Septal bounce consistent  with prior cardiac  surgery. Wall motion was normal; there were no  regional wall motion abnormalities. Features are consistent with  a pseudonormal left ventricular filling pattern, with concomitant  abnormal relaxation and increased filling pressure (grade 2  diastolic dysfunction).  - Aortic valve: There was no stenosis.  - Mitral valve: Status post mitral valve repair. No significant  stenosis. There was trivial regurgitation.  - Left atrium: The atrium was mildly dilated.  - Right ventricle: The cavity size was normal. Pacer wire or  catheter noted in right ventricle. Systolic function was mildly  reduced.  - Tricuspid valve: Peak RV-RA gradient (S): 27 mm Hg.  - Pulmonary arteries: PA peak pressure: 30 mm Hg (S).  - Inferior vena cava: The vessel was normal in size. The  respirophasic diameter changes were in the normal range (= 50%),  consistent with normal central venous pressure.   Impressions:   - Normal LV size with EF 55-60%. Septal bounce consistent with  prior cardiac surgery. Normal RV size with mildly decreased  systolic function. S/p mitral valve repair with trivial MR and no  significant stenosis.   Epic records are reviewed at length today  CHA2DS2-VASc Score = 5  The patient's score is based upon: CHF History: 1 HTN History: 1 Diabetes History: 0 Stroke History: 0 Vascular Disease History: 0      ASSESSMENT AND PLAN: 1. Paroxysmal Atrial Fibrillation (ICD10:  I48.0) The patient's CHA2DS2-VASc score is 5, indicating a 7.2% annual risk of stroke.   Patient in [place]. Will check bmet/CBC today. Continue Eliquis 2.5 mg BID (age, weight) Continue Toprol 100 mg daily  2. Secondary Hypercoagulable State (ICD10:  D68.69) The patient is at significant risk for stroke/thromboembolism based upon her CHA2DS2-VASc Score of 5.  Start Apixaban (Eliquis).   3. H/o NICM S/p CRT-P EF normalized on echo 2017. No signs or symptoms of fluid overload.  4.  HTN Elevated again today, patient reports it has been running  higher at home too. Will increase lisinopril to 20 mg daily Recheck BP and bmet in 2 weeks.   Follow up for BP check in 2 weeks. Dr [name] per recall.    [facility] [address] [place], [place] [address] [phone] 10/03/2020 11:25 AM

## 2020-10-17 ENCOUNTER — Ambulatory Visit (HOSPITAL_COMMUNITY)
Admission: RE | Admit: 2020-10-17 | Discharge: 2020-10-17 | Disposition: A | Discharge status: Home / Self Care | Payer: Medicare Other | Source: Ambulatory Visit | Attending: Physician Assistant | Admitting: Physician Assistant

## 2020-10-17 ENCOUNTER — Other Ambulatory Visit: Payer: Self-pay

## 2020-10-17 VITALS — BP 170/76

## 2020-10-17 DIAGNOSIS — I480 Paroxysmal atrial fibrillation: Secondary | ICD-10-CM | POA: Insufficient documentation

## 2020-10-17 LAB — BASIC METABOLIC PANEL
Anion gap: 12 (ref 5–15)
BUN: 39 mg/dL — ABNORMAL HIGH (ref 8–23)
CO2: 22 mmol/L (ref 22–32)
Calcium: 9.5 mg/dL (ref 8.9–10.3)
Chloride: 108 mmol/L (ref 98–111)
Creatinine, Ser: 1.2 mg/dL — ABNORMAL HIGH (ref 0.44–1.00)
GFR, Estimated: 44 mL/min — ABNORMAL LOW (ref >60)
Glucose, Bld: 85 mg/dL (ref 70–99)
Potassium: 4.4 mmol/L (ref 3.5–5.1)
Sodium: 142 mmol/L (ref 135–145)

## 2020-10-17 NOTE — Progress Notes (Signed)
Patient reports her BP having been 140s/80s at home since increasing lisinopril. Check bmet. F/u with Dr [name] per recall.

## 2020-11-09 DIAGNOSIS — E790 Hyperuricemia without signs of inflammatory arthritis and tophaceous disease: Secondary | ICD-10-CM | POA: Diagnosis not present

## 2020-11-09 DIAGNOSIS — M353 Polymyalgia rheumatica: Secondary | ICD-10-CM | POA: Diagnosis not present

## 2020-11-09 DIAGNOSIS — Z6821 Body mass index (BMI) 21.0-21.9, adult: Secondary | ICD-10-CM | POA: Diagnosis not present

## 2020-11-09 DIAGNOSIS — M4850XA Collapsed vertebra, not elsewhere classified, site unspecified, initial encounter for fracture: Secondary | ICD-10-CM | POA: Diagnosis not present

## 2020-11-09 DIAGNOSIS — M8000XG Age-related osteoporosis with current pathological fracture, unspecified site, subsequent encounter for fracture with delayed healing: Secondary | ICD-10-CM | POA: Diagnosis not present

## 2020-11-09 DIAGNOSIS — R609 Edema, unspecified: Secondary | ICD-10-CM | POA: Diagnosis not present

## 2020-11-09 DIAGNOSIS — M1A9XX1 Chronic gout, unspecified, with tophus (tophi): Secondary | ICD-10-CM | POA: Diagnosis not present

## 2020-11-09 DIAGNOSIS — M5136 Other intervertebral disc degeneration, lumbar region: Secondary | ICD-10-CM | POA: Diagnosis not present

## 2020-12-09 ENCOUNTER — Ambulatory Visit (INDEPENDENT_AMBULATORY_CARE_PROVIDER_SITE_OTHER): Payer: Medicare Other

## 2020-12-09 DIAGNOSIS — I429 Cardiomyopathy, unspecified: Secondary | ICD-10-CM

## 2020-12-12 LAB — CUP PACEART REMOTE DEVICE CHECK
Battery Remaining Longevity: 41 mo
Battery Voltage: 2.97 V
Brady Statistic AP VP Percent: 4.55 %
Brady Statistic AP VS Percent: 0.01 %
Brady Statistic AS VP Percent: 95.3 %
Brady Statistic AS VS Percent: 0.15 %
Brady Statistic RA Percent Paced: 4.55 %
Brady Statistic RV Percent Paced: 99.73 %
Date Time Interrogation Session: 20220114201929
Implantable Lead Implant Date: 20080612
Implantable Lead Implant Date: 20080612
Implantable Lead Implant Date: 20080612
Implantable Lead Location: 753858
Implantable Lead Location: 753859
Implantable Lead Location: 753860
Implantable Lead Model: 4194
Implantable Lead Model: 5076
Implantable Lead Model: 6947
Implantable Pulse Generator Implant Date: 20170516
Lead Channel Impedance Value: 285 Ohm
Lead Channel Impedance Value: 304 Ohm
Lead Channel Impedance Value: 323 Ohm
Lead Channel Impedance Value: 342 Ohm
Lead Channel Impedance Value: 361 Ohm
Lead Channel Impedance Value: 456 Ohm
Lead Channel Impedance Value: 532 Ohm
Lead Channel Impedance Value: 646 Ohm
Lead Channel Impedance Value: 760 Ohm
Lead Channel Pacing Threshold Amplitude: 0.625 V
Lead Channel Pacing Threshold Amplitude: 1.5 V
Lead Channel Pacing Threshold Amplitude: 2.375 V
Lead Channel Pacing Threshold Pulse Width: 0.4 ms
Lead Channel Pacing Threshold Pulse Width: 0.4 ms
Lead Channel Pacing Threshold Pulse Width: 0.4 ms
Lead Channel Sensing Intrinsic Amplitude: 2.25 mV
Lead Channel Sensing Intrinsic Amplitude: 2.25 mV
Lead Channel Sensing Intrinsic Amplitude: 8.125 mV
Lead Channel Sensing Intrinsic Amplitude: 8.125 mV
Lead Channel Setting Pacing Amplitude: 1.5 V
Lead Channel Setting Pacing Amplitude: 2 V
Lead Channel Setting Pacing Amplitude: 2.5 V
Lead Channel Setting Pacing Pulse Width: 0.4 ms
Lead Channel Setting Pacing Pulse Width: 0.8 ms
Lead Channel Setting Sensing Sensitivity: 2.8 mV

## 2020-12-22 NOTE — Progress Notes (Signed)
Remote pacemaker transmission.   

## 2021-01-02 ENCOUNTER — Encounter: Payer: Self-pay | Admitting: Internal Medicine

## 2021-01-02 ENCOUNTER — Other Ambulatory Visit: Payer: Self-pay

## 2021-01-02 ENCOUNTER — Ambulatory Visit (INDEPENDENT_AMBULATORY_CARE_PROVIDER_SITE_OTHER): Payer: Medicare Other | Admitting: Internal Medicine

## 2021-01-02 DIAGNOSIS — I429 Cardiomyopathy, unspecified: Secondary | ICD-10-CM

## 2021-01-02 MED ORDER — CARVEDILOL 25 MG PO TABS: 25.0000 mg | ORAL_TABLET | Freq: Two times a day (BID) | ORAL | 3 refills | Status: DC

## 2021-01-02 MED ORDER — APIXABAN 2.5 MG PO TABS: 2.5000 mg | ORAL_TABLET | Freq: Two times a day (BID) | ORAL | 3 refills | Status: DC

## 2021-01-02 MED ORDER — CARVEDILOL 25 MG PO TABS
25.0000 mg | ORAL_TABLET | Freq: Two times a day (BID) | ORAL | 3 refills | Status: DC
Start: 2021-01-02 — End: 2021-10-06

## 2021-01-02 MED ORDER — APIXABAN 2.5 MG PO TABS
2.5000 mg | ORAL_TABLET | Freq: Two times a day (BID) | ORAL | 3 refills | Status: DC
Start: 2021-01-02 — End: 2021-10-06

## 2021-01-02 NOTE — Patient Instructions (Signed)
Medication Instructions:  Eliquis 2.5 mg twice a day Coreg 25mg   twice a day  Stop metoprolol  *If you need a refill on your cardiac medications before your next appointment, please call your pharmacy*   Lab Work: none If you have labs (blood work) drawn today and your tests are completely normal, you will receive your results only by: Marland Kitchen MyChart Message (if you have MyChart) OR . A paper copy in the mail If you have any lab test that is abnormal or we need to change your treatment, we will call you to review the results.   Testing/Procedures: none   Follow-Up: At [facility], you and your health needs are our priority.  As part of our continuing mission to provide you with exceptional heart care, we have created designated Provider Care Teams.  These Care Teams include your primary Cardiologist (physician) and Advanced Practice Providers (APPs -  Physician Assistants and Nurse Practitioners) who all work together to provide you with the care you need, when you need it.  We recommend signing up for the patient portal called "MyChart".  Sign up information is provided on this After Visit Summary.  MyChart is used to connect with patients for Virtual Visits (Telemedicine).  Patients are able to view lab/test results, encounter notes, upcoming appointments, etc.  Non-urgent messages can be sent to your provider as well.   To learn more about what you can do with MyChart, go to [email or web address].    Your next appointment:   1 year(s)  The format for your next appointment:   In Person  Provider:   Cristopher Peru, MD   Other Instructions

## 2021-01-02 NOTE — Progress Notes (Signed)
HPI Mrs. Dolinar returns today for followup of her chronic systolic heart failure, PAF, HTN, sinus node dysfunction, s/p Biv PPM insertion. She has dyslipidemia and she denies dietary or medical non-compliance. She has never had syncope.   Allergies  Allergen Reactions  . Pertussis Vaccines Swelling  . Sulfa Antibiotics Nausea Only  . Amoxicillin Rash and Other (See Comments)    Has patient had a PCN reaction causing immediate rash, facial/tongue/throat swelling, SOB or lightheadedness with hypotension: yes Has patient had a PCN reaction causing severe rash involving mucus membranes or skin necrosis: no Has patient had a PCN reaction that required hospitalization no Has patient had a PCN reaction occurring within the last 10 years: yes If all of the above answers are "NO", then may proceed with Cephalosporin use.      Current Outpatient Medications  Medication Sig Dispense Refill  . allopurinol (ZYLOPRIM) 100 MG tablet Take 100 mg by mouth daily.    Marland Kitchen apixaban (ELIQUIS) 2.5 MG TABS tablet Take 1 tablet (2.5 mg total) by mouth 2 (two) times daily. 180 tablet 1  . atorvastatin (LIPITOR) 40 MG tablet Take 40 mg by mouth daily.    . Cholecalciferol (VITAMIN D3) 1000 UNITS CAPS Take 1,000 Units by mouth daily.     . furosemide (LASIX) 40 MG tablet Take 1 tablet by mouth daily.    Marland Kitchen HYDROcodone-acetaminophen (NORCO/VICODIN) 5-325 MG per tablet Take 1 tablet by mouth every 6 (six) hours as needed (For pain.).   0  . levothyroxine (SYNTHROID, LEVOTHROID) 75 MCG tablet Take 1 tablet (75 mcg total) by mouth daily. 90 tablet 1  . lisinopril (ZESTRIL) 20 MG tablet Take 1 tablet (20 mg total) by mouth daily. 30 tablet 3  . methylPREDNISolone (MEDROL) 2 MG tablet Take 2 mg by mouth daily.     . metoprolol succinate (TOPROL-XL) 100 MG 24 hr tablet Take 100 mg by mouth daily. Take with or immediately following a meal.    . omeprazole (PRILOSEC OTC) 20 MG tablet Take 20 mg by mouth 2 (two) times  daily.    . potassium chloride SA (KLOR-CON) 20 MEQ tablet Take 1 tablet by mouth daily.     No current facility-administered medications for this visit.     Past Medical History:  Diagnosis Date  . Allergic rhinitis   . Anemia    iron deficient  . Aneurysm ([place])   . Anxiety   . Basal cell carcinoma   . Chronic anemia   . Chronic low back pain   . Colon polyps   . GERD (gastroesophageal reflux disease)   . Hiatal hernia   . HTN (hypertension)   . Hypercholesteremia   . Hyperlipidemia   . Hypothyroidism   . Insomnia   . Left heart failure ([place])   . Osteoporosis    S/P forteo treatment as of 02/2009  . Pacemaker   . Polymyalgia ([place])    rheumatica-steroids per Rheum  . Systolic heart failure     ROS:   All systems reviewed and negative except as noted in the HPI.   Past Surgical History:  Procedure Laterality Date  . ABDOMINAL HYSTERECTOMY    . ANGIOPLASTY    . BACK SURGERY    . CARDIAC CATHETERIZATION  10/16/06  . CATARACT EXTRACTION    . defibulator implant    . EP IMPLANTABLE DEVICE N/A 04/10/2016   Procedure: BiV Pacemaker Insertion CRT-P;  Surgeon: Evans Lance, MD;  Location: [facility];  Service: Cardiovascular;  Laterality: N/A;  . FOOT SURGERY    . MITRAL VALVE REPAIR    . skin cancer removal    . THYROIDECTOMY    . TONSILLECTOMY       Family History  Problem Relation Age of Onset  . Stroke Mother        in her 68's  . Cancer Father 82       secondary  . Colon cancer Brother   . Colon cancer Sister   . Breast cancer Sister      Social History   Socioeconomic History  . Marital status: Widowed    Spouse name: Not on file  . Number of children: Not on file  . Years of education: Not on file  . Highest education level: Not on file  Occupational History  . Not on file  Tobacco Use  . Smoking status: Never Smoker  . Smokeless tobacco: Never Used  Vaping Use  . Vaping Use: Never used  Substance and Sexual Activity  .  Alcohol use: No  . Drug use: No  . Sexual activity: Not on file  Other Topics Concern  . Not on file  Social History Narrative  . Not on file   Social Determinants of Health   Financial Resource Strain: Not on file  Food Insecurity: Not on file  Transportation Needs: Not on file  Physical Activity: Not on file  Stress: Not on file  Social Connections: Not on file  Intimate Partner Violence: Not on file     BP (!) 198/86   Pulse 77   Ht 4\' 8"  (1.422 m)   Wt 96 lb 12.8 oz (43.9 kg)   SpO2 99%   BMI 21.70 kg/m   Physical Exam:  Well appearing NAD HEENT: Unremarkable Neck:  No JVD, no thyromegally Lymphatics:  No adenopathy Back:  No CVA tenderness Lungs:  Clear with no wheezes HEART:  Regular rate rhythm, no murmurs, no rubs, no clicks Abd:  soft, positive bowel sounds, no organomegally, no rebound, no guarding Ext:  2 plus pulses, 1+ edema, no cyanosis, no clubbing Skin:  No rashes no nodules Neuro:  CN II through XII intact, motor grossly intact  EKG - NSR  DEVICE  Normal device function.  See PaceArt for details.   Assess/Plan: 1. Chronic systolic heart failure - despite her bp being elevated, she is class 2. She will continue her current meds and reduce her sodium intake. 2. HTN - her bp was 198/86 on my exam. I asked her to stop toprol and switch to coreg 25 mg twice daily. 3. PPM - her medtronic biv PPM is working normally.  4. Atrial fib - her VR is controlled. She is only having ocaisional episodes. sje asked about stopping her eliquis but I recommended against this.  Royston Sinner Sohrab Keelan,MD

## 2021-01-06 LAB — CUP PACEART INCLINIC DEVICE CHECK
Battery Remaining Longevity: 42 mo
Battery Voltage: 2.97 V
Brady Statistic AP VP Percent: 4.51 %
Brady Statistic AP VS Percent: 0.01 %
Brady Statistic AS VP Percent: 95.3 %
Brady Statistic AS VS Percent: 0.19 %
Brady Statistic RA Percent Paced: 4.51 %
Brady Statistic RV Percent Paced: 99.68 %
Date Time Interrogation Session: 20220207204800
Implantable Lead Implant Date: 20080612
Implantable Lead Implant Date: 20080612
Implantable Lead Implant Date: 20080612
Implantable Lead Location: 753858
Implantable Lead Location: 753859
Implantable Lead Location: 753860
Implantable Lead Model: 4194
Implantable Lead Model: 5076
Implantable Lead Model: 6947
Implantable Pulse Generator Implant Date: 20170516
Lead Channel Impedance Value: 304 Ohm
Lead Channel Impedance Value: 304 Ohm
Lead Channel Impedance Value: 342 Ohm
Lead Channel Impedance Value: 361 Ohm
Lead Channel Impedance Value: 399 Ohm
Lead Channel Impedance Value: 494 Ohm
Lead Channel Impedance Value: 532 Ohm
Lead Channel Impedance Value: 665 Ohm
Lead Channel Impedance Value: 760 Ohm
Lead Channel Pacing Threshold Amplitude: 0.5 V
Lead Channel Pacing Threshold Amplitude: 1.5 V
Lead Channel Pacing Threshold Amplitude: 2.375 V
Lead Channel Pacing Threshold Pulse Width: 0.4 ms
Lead Channel Pacing Threshold Pulse Width: 0.4 ms
Lead Channel Pacing Threshold Pulse Width: 0.4 ms
Lead Channel Sensing Intrinsic Amplitude: 16.875 mV
Lead Channel Sensing Intrinsic Amplitude: 2.5 mV
Lead Channel Sensing Intrinsic Amplitude: 2.5 mV
Lead Channel Sensing Intrinsic Amplitude: 8.125 mV
Lead Channel Setting Pacing Amplitude: 1.5 V
Lead Channel Setting Pacing Amplitude: 2 V
Lead Channel Setting Pacing Amplitude: 2.5 V
Lead Channel Setting Pacing Pulse Width: 0.4 ms
Lead Channel Setting Pacing Pulse Width: 0.8 ms
Lead Channel Setting Sensing Sensitivity: 2.8 mV

## 2021-01-16 ENCOUNTER — Other Ambulatory Visit: Payer: Self-pay

## 2021-01-16 MED ORDER — LISINOPRIL 20 MG PO TABS
20.0000 mg | ORAL_TABLET | Freq: Every day | ORAL | 3 refills | Status: DC
Start: 2021-01-16 — End: 2021-01-20

## 2021-01-16 NOTE — Telephone Encounter (Signed)
This is a A-Fib clinic pt 

## 2021-01-20 MED ORDER — LISINOPRIL 20 MG PO TABS
20.0000 mg | ORAL_TABLET | Freq: Every day | ORAL | 3 refills | Status: DC
Start: 2021-01-20 — End: 2021-10-06

## 2021-01-20 NOTE — Addendum Note (Signed)
Addended by: Carter Kitten D on: 01/20/2021 01:23 PM   Modules accepted: Orders

## 2021-01-24 DIAGNOSIS — M810 Age-related osteoporosis without current pathological fracture: Secondary | ICD-10-CM | POA: Diagnosis not present

## 2021-01-25 DIAGNOSIS — M810 Age-related osteoporosis without current pathological fracture: Secondary | ICD-10-CM | POA: Diagnosis not present

## 2021-03-31 DIAGNOSIS — M810 Age-related osteoporosis without current pathological fracture: Secondary | ICD-10-CM | POA: Diagnosis not present

## 2021-03-31 DIAGNOSIS — N183 Chronic kidney disease, stage 3 unspecified: Secondary | ICD-10-CM | POA: Diagnosis not present

## 2021-03-31 DIAGNOSIS — E782 Mixed hyperlipidemia: Secondary | ICD-10-CM | POA: Diagnosis not present

## 2021-03-31 DIAGNOSIS — R0989 Other specified symptoms and signs involving the circulatory and respiratory systems: Secondary | ICD-10-CM | POA: Diagnosis not present

## 2021-03-31 DIAGNOSIS — F419 Anxiety disorder, unspecified: Secondary | ICD-10-CM | POA: Diagnosis not present

## 2021-03-31 DIAGNOSIS — M353 Polymyalgia rheumatica: Secondary | ICD-10-CM | POA: Diagnosis not present

## 2021-03-31 DIAGNOSIS — I4891 Unspecified atrial fibrillation: Secondary | ICD-10-CM | POA: Diagnosis not present

## 2021-03-31 DIAGNOSIS — I10 Essential (primary) hypertension: Secondary | ICD-10-CM | POA: Diagnosis not present

## 2021-03-31 DIAGNOSIS — I509 Heart failure, unspecified: Secondary | ICD-10-CM | POA: Diagnosis not present

## 2021-03-31 DIAGNOSIS — E039 Hypothyroidism, unspecified: Secondary | ICD-10-CM | POA: Diagnosis not present

## 2021-03-31 DIAGNOSIS — Z0000 Encounter for general adult medical examination without abnormal findings: Secondary | ICD-10-CM | POA: Diagnosis not present

## 2021-03-31 DIAGNOSIS — D509 Iron deficiency anemia, unspecified: Secondary | ICD-10-CM | POA: Diagnosis not present

## 2021-04-03 ENCOUNTER — Other Ambulatory Visit: Payer: Self-pay | Admitting: Family Medicine

## 2021-04-03 DIAGNOSIS — R0989 Other specified symptoms and signs involving the circulatory and respiratory systems: Secondary | ICD-10-CM

## 2021-05-03 DIAGNOSIS — R197 Diarrhea, unspecified: Secondary | ICD-10-CM | POA: Diagnosis not present

## 2021-05-03 DIAGNOSIS — I10 Essential (primary) hypertension: Secondary | ICD-10-CM | POA: Diagnosis not present

## 2021-05-03 DIAGNOSIS — D509 Iron deficiency anemia, unspecified: Secondary | ICD-10-CM | POA: Diagnosis not present

## 2021-05-19 ENCOUNTER — Ambulatory Visit
Admission: RE | Admit: 2021-05-19 | Discharge: 2021-05-19 | Disposition: A | Discharge status: Home / Self Care | Payer: Medicare Other | Source: Ambulatory Visit | Attending: Family Medicine | Admitting: Family Medicine

## 2021-05-19 DIAGNOSIS — I771 Stricture of artery: Secondary | ICD-10-CM | POA: Diagnosis not present

## 2021-05-19 DIAGNOSIS — I6523 Occlusion and stenosis of bilateral carotid arteries: Secondary | ICD-10-CM | POA: Diagnosis not present

## 2021-05-19 DIAGNOSIS — R0989 Other specified symptoms and signs involving the circulatory and respiratory systems: Secondary | ICD-10-CM

## 2021-07-05 DIAGNOSIS — E039 Hypothyroidism, unspecified: Secondary | ICD-10-CM | POA: Diagnosis not present

## 2021-07-05 DIAGNOSIS — D509 Iron deficiency anemia, unspecified: Secondary | ICD-10-CM | POA: Diagnosis not present

## 2021-07-05 DIAGNOSIS — R197 Diarrhea, unspecified: Secondary | ICD-10-CM | POA: Diagnosis not present

## 2021-07-05 DIAGNOSIS — I10 Essential (primary) hypertension: Secondary | ICD-10-CM | POA: Diagnosis not present

## 2021-07-28 DIAGNOSIS — M810 Age-related osteoporosis without current pathological fracture: Secondary | ICD-10-CM | POA: Diagnosis not present

## 2021-08-18 DIAGNOSIS — Z23 Encounter for immunization: Secondary | ICD-10-CM | POA: Diagnosis not present

## 2021-09-30 ENCOUNTER — Emergency Department (HOSPITAL_COMMUNITY)
Admission: EM | Admit: 2021-09-30 | Discharge: 2021-09-30 | Disposition: A | Discharge status: Home / Self Care | Payer: Medicare Other | Source: Home / Self Care | Attending: Emergency Medicine | Admitting: Emergency Medicine

## 2021-09-30 ENCOUNTER — Emergency Department (HOSPITAL_COMMUNITY): Payer: Medicare Other

## 2021-09-30 DIAGNOSIS — M47812 Spondylosis without myelopathy or radiculopathy, cervical region: Secondary | ICD-10-CM | POA: Diagnosis not present

## 2021-09-30 DIAGNOSIS — D649 Anemia, unspecified: Secondary | ICD-10-CM | POA: Diagnosis not present

## 2021-09-30 DIAGNOSIS — M545 Low back pain, unspecified: Secondary | ICD-10-CM | POA: Diagnosis not present

## 2021-09-30 DIAGNOSIS — S41011A Laceration without foreign body of right shoulder, initial encounter: Secondary | ICD-10-CM | POA: Insufficient documentation

## 2021-09-30 DIAGNOSIS — S32591A Other specified fracture of right pubis, initial encounter for closed fracture: Secondary | ICD-10-CM | POA: Diagnosis not present

## 2021-09-30 DIAGNOSIS — Z7901 Long term (current) use of anticoagulants: Secondary | ICD-10-CM | POA: Insufficient documentation

## 2021-09-30 DIAGNOSIS — S0511XA Contusion of eyeball and orbital tissues, right eye, initial encounter: Secondary | ICD-10-CM | POA: Diagnosis not present

## 2021-09-30 DIAGNOSIS — I517 Cardiomegaly: Secondary | ICD-10-CM | POA: Diagnosis not present

## 2021-09-30 DIAGNOSIS — E039 Hypothyroidism, unspecified: Secondary | ICD-10-CM | POA: Insufficient documentation

## 2021-09-30 DIAGNOSIS — S51011A Laceration without foreign body of right elbow, initial encounter: Secondary | ICD-10-CM | POA: Diagnosis not present

## 2021-09-30 DIAGNOSIS — I428 Other cardiomyopathies: Secondary | ICD-10-CM | POA: Diagnosis not present

## 2021-09-30 DIAGNOSIS — Z20822 Contact with and (suspected) exposure to covid-19: Secondary | ICD-10-CM | POA: Insufficient documentation

## 2021-09-30 DIAGNOSIS — Z85828 Personal history of other malignant neoplasm of skin: Secondary | ICD-10-CM | POA: Insufficient documentation

## 2021-09-30 DIAGNOSIS — M7981 Nontraumatic hematoma of soft tissue: Secondary | ICD-10-CM | POA: Diagnosis not present

## 2021-09-30 DIAGNOSIS — S81811A Laceration without foreign body, right lower leg, initial encounter: Secondary | ICD-10-CM | POA: Diagnosis not present

## 2021-09-30 DIAGNOSIS — S0993XA Unspecified injury of face, initial encounter: Secondary | ICD-10-CM | POA: Diagnosis not present

## 2021-09-30 DIAGNOSIS — W19XXXA Unspecified fall, initial encounter: Secondary | ICD-10-CM

## 2021-09-30 DIAGNOSIS — M4312 Spondylolisthesis, cervical region: Secondary | ICD-10-CM | POA: Diagnosis not present

## 2021-09-30 DIAGNOSIS — R000 Tachycardia, unspecified: Secondary | ICD-10-CM | POA: Diagnosis not present

## 2021-09-30 DIAGNOSIS — S42294A Other nondisplaced fracture of upper end of right humerus, initial encounter for closed fracture: Secondary | ICD-10-CM | POA: Diagnosis not present

## 2021-09-30 DIAGNOSIS — Z79899 Other long term (current) drug therapy: Secondary | ICD-10-CM | POA: Insufficient documentation

## 2021-09-30 DIAGNOSIS — S7001XA Contusion of right hip, initial encounter: Secondary | ICD-10-CM | POA: Insufficient documentation

## 2021-09-30 DIAGNOSIS — S0181XA Laceration without foreign body of other part of head, initial encounter: Secondary | ICD-10-CM

## 2021-09-30 DIAGNOSIS — I6529 Occlusion and stenosis of unspecified carotid artery: Secondary | ICD-10-CM | POA: Diagnosis not present

## 2021-09-30 DIAGNOSIS — I5043 Acute on chronic combined systolic (congestive) and diastolic (congestive) heart failure: Secondary | ICD-10-CM | POA: Diagnosis not present

## 2021-09-30 DIAGNOSIS — M546 Pain in thoracic spine: Secondary | ICD-10-CM | POA: Diagnosis not present

## 2021-09-30 DIAGNOSIS — I5022 Chronic systolic (congestive) heart failure: Secondary | ICD-10-CM | POA: Insufficient documentation

## 2021-09-30 DIAGNOSIS — I4891 Unspecified atrial fibrillation: Secondary | ICD-10-CM | POA: Insufficient documentation

## 2021-09-30 DIAGNOSIS — E875 Hyperkalemia: Secondary | ICD-10-CM | POA: Diagnosis not present

## 2021-09-30 DIAGNOSIS — Z90710 Acquired absence of both cervix and uterus: Secondary | ICD-10-CM | POA: Diagnosis not present

## 2021-09-30 DIAGNOSIS — R110 Nausea: Secondary | ICD-10-CM | POA: Diagnosis not present

## 2021-09-30 DIAGNOSIS — I10 Essential (primary) hypertension: Secondary | ICD-10-CM | POA: Diagnosis not present

## 2021-09-30 DIAGNOSIS — S42291A Other displaced fracture of upper end of right humerus, initial encounter for closed fracture: Secondary | ICD-10-CM | POA: Diagnosis not present

## 2021-09-30 DIAGNOSIS — I110 Hypertensive heart disease with heart failure: Secondary | ICD-10-CM | POA: Insufficient documentation

## 2021-09-30 DIAGNOSIS — I959 Hypotension, unspecified: Secondary | ICD-10-CM | POA: Diagnosis not present

## 2021-09-30 DIAGNOSIS — K760 Fatty (change of) liver, not elsewhere classified: Secondary | ICD-10-CM | POA: Diagnosis not present

## 2021-09-30 DIAGNOSIS — R52 Pain, unspecified: Secondary | ICD-10-CM | POA: Diagnosis not present

## 2021-09-30 DIAGNOSIS — S0003XA Contusion of scalp, initial encounter: Secondary | ICD-10-CM | POA: Diagnosis not present

## 2021-09-30 DIAGNOSIS — W010XXA Fall on same level from slipping, tripping and stumbling without subsequent striking against object, initial encounter: Secondary | ICD-10-CM | POA: Insufficient documentation

## 2021-09-30 DIAGNOSIS — Z043 Encounter for examination and observation following other accident: Secondary | ICD-10-CM | POA: Diagnosis not present

## 2021-09-30 DIAGNOSIS — S42221A 2-part displaced fracture of surgical neck of right humerus, initial encounter for closed fracture: Secondary | ICD-10-CM

## 2021-09-30 DIAGNOSIS — I700 Atherosclerosis of aorta: Secondary | ICD-10-CM | POA: Diagnosis not present

## 2021-09-30 DIAGNOSIS — S72001A Fracture of unspecified part of neck of right femur, initial encounter for closed fracture: Secondary | ICD-10-CM | POA: Diagnosis not present

## 2021-09-30 DIAGNOSIS — N179 Acute kidney failure, unspecified: Secondary | ICD-10-CM | POA: Diagnosis not present

## 2021-09-30 DIAGNOSIS — S299XXA Unspecified injury of thorax, initial encounter: Secondary | ICD-10-CM | POA: Diagnosis not present

## 2021-09-30 DIAGNOSIS — K4090 Unilateral inguinal hernia, without obstruction or gangrene, not specified as recurrent: Secondary | ICD-10-CM | POA: Diagnosis not present

## 2021-09-30 DIAGNOSIS — S32511A Fracture of superior rim of right pubis, initial encounter for closed fracture: Secondary | ICD-10-CM | POA: Diagnosis not present

## 2021-09-30 DIAGNOSIS — S42211A Unspecified displaced fracture of surgical neck of right humerus, initial encounter for closed fracture: Secondary | ICD-10-CM | POA: Diagnosis not present

## 2021-09-30 DIAGNOSIS — S32519A Fracture of superior rim of unspecified pubis, initial encounter for closed fracture: Secondary | ICD-10-CM | POA: Diagnosis not present

## 2021-09-30 DIAGNOSIS — I739 Peripheral vascular disease, unspecified: Secondary | ICD-10-CM | POA: Diagnosis not present

## 2021-09-30 LAB — COMPREHENSIVE METABOLIC PANEL
ALT: 34 U/L (ref 0–44)
AST: 35 U/L (ref 15–41)
Albumin: 3.5 g/dL (ref 3.5–5.0)
Alkaline Phosphatase: 53 U/L (ref 38–126)
Anion gap: 6 (ref 5–15)
BUN: 60 mg/dL — ABNORMAL HIGH (ref 8–23)
CO2: 22 mmol/L (ref 22–32)
Calcium: 8.8 mg/dL — ABNORMAL LOW (ref 8.9–10.3)
Chloride: 112 mmol/L — ABNORMAL HIGH (ref 98–111)
Creatinine, Ser: 1.42 mg/dL — ABNORMAL HIGH (ref 0.44–1.00)
GFR, Estimated: 36 mL/min — ABNORMAL LOW (ref >60)
Glucose, Bld: 123 mg/dL — ABNORMAL HIGH (ref 70–99)
Potassium: 5.5 mmol/L — ABNORMAL HIGH (ref 3.5–5.1)
Sodium: 140 mmol/L (ref 135–145)
Total Bilirubin: 0.6 mg/dL (ref 0.3–1.2)
Total Protein: 6.1 g/dL — ABNORMAL LOW (ref 6.5–8.1)

## 2021-09-30 LAB — CBC WITH DIFFERENTIAL/PLATELET
Abs Immature Granulocytes: 0.09 10*3/uL — ABNORMAL HIGH (ref 0.00–0.07)
Basophils Absolute: 0.1 10*3/uL (ref 0.0–0.1)
Basophils Relative: 1 %
Eosinophils Absolute: 0.1 10*3/uL (ref 0.0–0.5)
Eosinophils Relative: 1 %
HCT: 29.4 % — ABNORMAL LOW (ref 36.0–46.0)
Hemoglobin: 9.4 g/dL — ABNORMAL LOW (ref 12.0–15.0)
Immature Granulocytes: 1 %
Lymphocytes Relative: 13 %
Lymphs Abs: 1.4 10*3/uL (ref 0.7–4.0)
MCH: 33 pg (ref 26.0–34.0)
MCHC: 32 g/dL (ref 30.0–36.0)
MCV: 103.2 fL — ABNORMAL HIGH (ref 80.0–100.0)
Monocytes Absolute: 0.3 10*3/uL (ref 0.1–1.0)
Monocytes Relative: 3 %
Neutro Abs: 8.8 10*3/uL — ABNORMAL HIGH (ref 1.7–7.7)
Neutrophils Relative %: 81 %
Platelets: 207 10*3/uL (ref 150–400)
RBC: 2.85 MIL/uL — ABNORMAL LOW (ref 3.87–5.11)
RDW: 15.3 % (ref 11.5–15.5)
WBC: 10.7 10*3/uL — ABNORMAL HIGH (ref 4.0–10.5)
nRBC: 0 % (ref 0.0–0.2)

## 2021-09-30 LAB — RESP PANEL BY RT-PCR (FLU A&B, COVID) ARPGX2
Influenza A by PCR: NEGATIVE
Influenza B by PCR: NEGATIVE
SARS Coronavirus 2 by RT PCR: NEGATIVE

## 2021-09-30 LAB — LACTIC ACID, PLASMA: Lactic Acid, Venous: 1.3 mmol/L (ref 0.5–1.9)

## 2021-09-30 LAB — PROTIME-INR
INR: 1.2 (ref 0.8–1.2)
Prothrombin Time: 15.5 seconds — ABNORMAL HIGH (ref 11.4–15.2)

## 2021-09-30 LAB — CK: Total CK: 146 U/L (ref 38–234)

## 2021-09-30 MED ORDER — FENTANYL CITRATE PF 50 MCG/ML IJ SOSY
50.0000 ug | PREFILLED_SYRINGE | Freq: Once | INTRAMUSCULAR | Status: AC
  Administered 2021-09-30: 50 ug via INTRAVENOUS
  Filled 2021-09-30: qty 1

## 2021-09-30 MED ORDER — SODIUM CHLORIDE 0.9 % IV BOLUS
1000.0000 mL | Freq: Once | INTRAVENOUS | Status: AC
  Administered 2021-09-30: 1000 mL via INTRAVENOUS

## 2021-09-30 MED ORDER — FENTANYL CITRATE PF 50 MCG/ML IJ SOSY
100.0000 ug | PREFILLED_SYRINGE | Freq: Once | INTRAMUSCULAR | Status: AC
  Administered 2021-09-30: 100 ug via INTRAVENOUS
  Filled 2021-09-30: qty 2

## 2021-09-30 MED ORDER — ONDANSETRON HCL 4 MG/2ML IJ SOLN
4.0000 mg | Freq: Once | INTRAMUSCULAR | Status: AC
  Administered 2021-09-30: 4 mg via INTRAVENOUS
  Filled 2021-09-30: qty 2

## 2021-09-30 MED ORDER — LOKELMA 5 G PO PACK: 10.0000 g | PACK | Freq: Two times a day (BID) | ORAL | 0 refills | Status: AC

## 2021-09-30 MED ORDER — SODIUM ZIRCONIUM CYCLOSILICATE 10 G PO PACK
10.0000 g | PACK | Freq: Once | ORAL | Status: AC
  Administered 2021-09-30: 10 g via ORAL
  Filled 2021-09-30: qty 1

## 2021-09-30 NOTE — ED Provider Notes (Signed)
[facility] EMERGENCY DEPARTMENT Provider Note   CSN: [id number] Arrival date & time: 09/30/21  1513     History No chief complaint on file.   [name] is a 87 y.o. female.  HPI     88 year old female with a history of hyperlipidemia, hypertension, hypothyroidism, paroxysmal atrial fibrillation on Eliquis, defibrillator, CHF, who presents with concern for mechanical fall as a level 2 trauma for fall on anticoagulation.  Reports she was in her normal state of health until she tripped on the rug today and fell down, injuring her right arm, right hip, and the right side of her head and face.  She has a laceration to her head, bruising, and reports she is unable to get up right away and it took her about 45 minutes to drag herself across the floor to call her daughter for assistance.  She denies any recent illness, including no fever, cough, shortness of breath, chest pain, abdominal pain,vomiting, diarrhea.  Reports that she has chronic nausea that she attributes to her medications, but is not acutely changed.  Denies LOC. Pain is 7/10 in right face/head, right elbow. Right hip feeling better.   Past Medical History:  Diagnosis Date   Allergic rhinitis    Anemia    iron deficient   Aneurysm ([facility])    Anxiety    Basal cell carcinoma    Chronic anemia    Chronic low back pain    Colon polyps    GERD (gastroesophageal reflux disease)    Hiatal hernia    HTN (hypertension)    Hypercholesteremia    Hyperlipidemia    Hypothyroidism    Insomnia    Left heart failure ([place])    Osteoporosis    S/P forteo treatment as of 02/2009   Pacemaker    Polymyalgia ([place])    rheumatica-steroids per Rheum   Systolic heart failure     Patient Active Problem List   Diagnosis Date Noted   Paroxysmal atrial fibrillation ([place]) 08/31/2020   Secondary hypercoagulable state ([place]) 08/31/2020   Hyponatremia 03/28/2019   AKI (acute kidney injury) ([place])    Cardiomyopathy ([place])  01/25/2014   Mitral valve disorder 01/25/2014   Combined hyperlipidemia 01/25/2014   Biventricular implantable cardioverter-defibrillator in situ 82/50/5397   Chronic systolic heart failure ([place]) 04/24/2011   Hypertension, benign 04/24/2011   Dyslipidemia 04/24/2011    Past Surgical History:  Procedure Laterality Date   ABDOMINAL HYSTERECTOMY     ANGIOPLASTY     BACK SURGERY     CARDIAC CATHETERIZATION  10/16/06   CATARACT EXTRACTION     defibulator implant     EP IMPLANTABLE DEVICE N/A 04/10/2016   Procedure: BiV Pacemaker Insertion CRT-P;  Surgeon: Evans Lance, MD;  Location: [facility];  Service: Cardiovascular;  Laterality: N/A;   FOOT SURGERY     MITRAL VALVE REPAIR     skin cancer removal     THYROIDECTOMY     TONSILLECTOMY       OB History   No obstetric history on file.     Family History  Problem Relation Age of Onset   Stroke Mother        in her 13's   Cancer Father 54       secondary   Colon cancer Brother    Colon cancer Sister    Breast cancer Sister     Social History   Tobacco Use   Smoking status: Never   Smokeless tobacco:  Never  Vaping Use   Vaping Use: Never used  Substance Use Topics   Alcohol use: No   Drug use: No    Home Medications Prior to Admission medications   Medication Sig Start Date End Date Taking? Authorizing Provider  allopurinol (ZYLOPRIM) 100 MG tablet Take 100 mg by mouth daily.    [provider]  apixaban (ELIQUIS) 2.5 MG TABS tablet Take 1 tablet (2.5 mg total) by mouth 2 (two) times daily. 01/02/21   Evans Lance, MD  atorvastatin (LIPITOR) 40 MG tablet Take 40 mg by mouth daily. 04/06/15   [provider]  carvedilol (COREG) 25 MG tablet Take 1 tablet (25 mg total) by mouth 2 (two) times daily. 01/02/21   Evans Lance, MD  Cholecalciferol (VITAMIN D3) 1000 UNITS CAPS Take 1,000 Units by mouth daily.     [provider]  furosemide (LASIX) 40 MG tablet Take 1 tablet by mouth  daily. 12/01/19   [provider]  HYDROcodone-acetaminophen (NORCO/VICODIN) 5-325 MG per tablet Take 1 tablet by mouth every 6 (six) hours as needed (For pain.).  03/30/15   [provider]  levothyroxine (SYNTHROID, LEVOTHROID) 75 MCG tablet Take 1 tablet (75 mcg total) by mouth daily. 11/03/14   Jettie Booze, MD  lisinopril (ZESTRIL) 20 MG tablet Take 1 tablet (20 mg total) by mouth daily. 01/20/21   Evans Lance, MD  methylPREDNISolone (MEDROL) 2 MG tablet Take 2 mg by mouth daily.     [provider]  omeprazole (PRILOSEC OTC) 20 MG tablet Take 20 mg by mouth 2 (two) times daily.    [provider]  potassium chloride SA (KLOR-CON) 20 MEQ tablet Take 1 tablet by mouth daily. 10/15/19   [provider]    Allergies    Pertussis vaccines, Sulfa antibiotics, and Amoxicillin  Review of Systems   Review of Systems  Constitutional:  Negative for fever.  HENT:  Negative for sore throat.   Eyes:  Negative for visual disturbance.  Respiratory:  Negative for cough and shortness of breath.   Cardiovascular:  Negative for chest pain.  Gastrointestinal:  Positive for nausea (chronic not changed). Negative for abdominal pain, diarrhea and vomiting.  Genitourinary:  Negative for difficulty urinating.  Musculoskeletal:  Positive for arthralgias and myalgias. Negative for back pain and neck pain.  Skin:  Positive for wound. Negative for rash.  Neurological:  Positive for headaches. Negative for syncope.   Physical Exam Updated Vital Signs BP (!) 180/70   Pulse 68   Temp (!) 94.8 F (34.9 C) (Rectal)   Resp 17   SpO2 100%   Physical Exam Vitals and nursing note reviewed.  Constitutional:      General: She is not in acute distress.    Appearance: She is well-developed. She is not diaphoretic.  HENT:     Head: Normocephalic.     Comments: 1cm laceration supraorbital, significant periorbital contusion right eye, mild medial orbital contusion,  right forehead contusion Eyes:     Conjunctiva/sclera: Conjunctivae normal.  Cardiovascular:     Rate and Rhythm: Normal rate and regular rhythm.     Heart sounds: Normal heart sounds. No murmur heard.   No friction rub. No gallop.  Pulmonary:     Effort: Pulmonary effort is normal. No respiratory distress.     Breath sounds: Normal breath sounds. No wheezing or rales.  Abdominal:     General: There is no distension.     Palpations: Abdomen is  soft.     Tenderness: There is no abdominal tenderness. There is no guarding.  Musculoskeletal:        General: Tenderness (right elbow, right superior arm, right hip) present.     Cervical back: Normal range of motion.  Skin:    General: Skin is warm and dry.     Findings: No erythema or rash.     Comments: Laceration right forehead, skin tear right arm Contusions face/head, right hip  Neurological:     Mental Status: She is alert and oriented to person, place, and time.    ED Results / Procedures / Treatments   Labs (all labs ordered are listed, but only abnormal results are displayed) Labs Reviewed  RESP PANEL BY RT-PCR (FLU A&B, COVID) ARPGX2  CULTURE, BLOOD (ROUTINE X 2)  CULTURE, BLOOD (ROUTINE X 2)  URINE CULTURE  LACTIC ACID, PLASMA  LACTIC ACID, PLASMA  CBC WITH DIFFERENTIAL/PLATELET  COMPREHENSIVE METABOLIC PANEL  PROTIME-INR  TSH  T4, FREE  URINALYSIS, ROUTINE W REFLEX MICROSCOPIC    EKG EKG Interpretation  Date/Time:  Saturday September 30 2021 15:42:19 EDT Ventricular Rate:  67 PR Interval:  187 QRS Duration: 132 QT Interval:  468 QTC Calculation: 495 R Axis:   169 Text Interpretation: Sinus rhythm Nonspecific intraventricular conduction delay Anterolateral infarct, age indeterminate Artifact in lead(s) I II III aVR aVL aVF V1 V5 V6 No significant change since last tracing Confirmed by Gareth Morgan [phone]) on 09/30/2021 3:45:19 PM  Radiology No results found.  Procedures Procedures   Medications  Ordered in ED Medications  fentaNYL (SUBLIMAZE) injection 50 mcg (has no administration in time range)    ED Course  I have reviewed the triage vital signs and the nursing notes.  Pertinent labs & imaging results that were available during my care of the patient were reviewed by me and considered in my medical decision making (see chart for details).    MDM Rules/Calculators/A&P                            87 year old female with a history of hyperlipidemia, hypertension, hypothyroidism, paroxysmal atrial fibrillation on Eliquis, defibrillator, CHF, who presents with concern for mechanical fall as a level 2 trauma for fall on anticoagulation.  CT head, cervical spine, maxillofacial ordered given history of trauma on anticoagulation.  X-ray of the right shoulder, elbow and hip are also ordered.  Temperature was found to be mildly hypothermic at 94.8.  Unclear etiology of this hypothermia on evaluation, with patient denying any symptoms prior to fall, and while consider it, we do not suspect environmental hyperthermia.  Given his hypothermia, have ordered TSH, free T4, sepsis evaluation including chest x-ray, urinalysis.   Prior to obtaining blood cx, urinalysis, thyroid studies, staff retested temperature and found it to be 97.3  Suspect first value may have been inaccurate given improvement, no symptoms or other signs to suspect sepsis or thyroid emergency.  WBC 10.7, similar to values it has been in the past, most recently 9.1 one year ago, lactate 1.3, normal mental status. Cr 1.42 from 1.2, K 5.5, given lokelma and rx for one day for same for hyperkalemia in setting of CKD. CK WNL.  Discussed possibility of obtaining additional blood cx with pt and daughter however given she is otherwise feeling well without infectious symptoms do not feel we need to hold her in the ED for further evaluation with possibility initial temperature was inaccurate. COVID/flu testing negative.  From the traumatic  standpoint, CT head WNL. CSpine with findings likely chronic, no midline tenderness, no fracture. No facial fractures.    XR shows fracture of humeral neck and greater tuberosity.  NV intact, closed fracture.  Laceration to forehead not amenable to repair, steristrips placed for support, skin tear dressed on elbow, and placed in sling.  She sees pain management for hydrocodone,advised she can take this medication more frequently if she is having severe pain from her fracture and discuss with her pain dr increasing from every 6 hours to every 3-4 hours if she needs, however hopefully she will be controlled on home medications.   Pt to see Dr. Griffin Basil on Tuesday for humerus fracture, follow up with Dr. Harrington Challenger for recheck renal function, K. Patient discharged in stable condition with understanding of reasons to return.    Final Clinical Impression(s) / ED Diagnoses Final diagnoses:  None    Rx / DC Orders ED Discharge Orders     None        Gareth Morgan, MD 10/01/21 0028

## 2021-09-30 NOTE — ED Notes (Signed)
Unable to obtain oral or axillary temp.  Rectal temp obtained by Autumn-TRN while Ernie-RT rolled and Josh-Primary RN held C-spine.  Rectal temp was 94.8.  EDP notified. Pt has lots of warm blankets on  and will receive warm fluids post scans.

## 2021-09-30 NOTE — Progress Notes (Signed)
   09/30/21 1540  Clinical Encounter Type  Visited With Family;Health care provider  Visit Type Initial;ED;Trauma   Chaplain responded to a trauma in the ED - level II, fall on blood thinners. Chaplain met with patient's daughter in the waiting room and helped them transition to the patient's room. Chaplain extended hospitality. Chaplain introduced spiritual care services. Spiritual care services available as needed.   Jeri Lager, Chaplain

## 2021-09-30 NOTE — Progress Notes (Signed)
Orthopedic Tech Progress Note Patient Details:  [name] Feb 04, 1932 [id number] Level 2 Trauma  Patient ID: [name], female   DOB: 09/28/1932, 87 y.o.   MRN: [id number]  Jearld Lesch 09/30/2021, 3:24 PM

## 2021-09-30 NOTE — Progress Notes (Signed)
Orthopedic Tech Progress Note Patient Details:  [name] December 29, 1931 [id number]  Ortho Devices Type of Ortho Device: Sling immobilizer Ortho Device/Splint Location: rue Ortho Device/Splint Interventions: Ordered, Application, Adjustment  I assisted the RN with dressing application. Post Interventions Patient Tolerated: Well Instructions Provided: Care of device, Adjustment of device  Karolee Stamps 09/30/2021, 8:41 PM

## 2021-09-30 NOTE — ED Triage Notes (Addendum)
PT BIB [facility] for mechanical fall from standing position approx. 1400.  Pt takes eliquis.  Pt does not report any LOC.  Pt states it took her about 45 min to get to phone after fall.  Pt has a hematoma with lac above right eye and periorbital bruising to right eye.  Pt also has right shoulder and hip tenderness as well as a skin tear and hematoma to right elbow.  PT is a&O x4. VSS stable.

## 2021-09-30 NOTE — Progress Notes (Signed)
..  Trauma Response Nurse Note-  Reason for Call / Reason for Trauma activation:   Lvl 2 fall on thinners-89yoF GLF  Initial Focused Assessment (If applicable, or please see trauma documentation):  Large hematoma to R eye with 1cm laceration over R eyebrow. Scattered bruising to R knee, R hip-tenderness noted,L arm, R elbow skin tear with minimal bleeding before being dressed, pain and swelling noted.   Interventions:  -Wounds dressed, pt undressed, log roll, rectal temp required d/t unable to assess oral or axillary temp, MD made ware of low rectal temp.  Covid swab completed and sent.   Plan of Care as of this note:  -CT's/labs/xrays pending  Event Summary:   -per daughter at bedside, pt does live alone and functions independently. Pt was getting christmas decorations out and was walking from open area and believes she may have tripped on area rug. Pt A & O on arrival.   The Following (if applicable):    -MD notified:     -Time of Page/Time of notification:     -TRN arrival Time:     -End time:

## 2021-09-30 NOTE — ED Notes (Signed)
X-ray at bedside

## 2021-10-01 ENCOUNTER — Encounter (HOSPITAL_COMMUNITY): Payer: Self-pay | Admitting: *Deleted

## 2021-10-01 ENCOUNTER — Other Ambulatory Visit (HOSPITAL_COMMUNITY): Payer: Medicare Other

## 2021-10-01 ENCOUNTER — Emergency Department (HOSPITAL_COMMUNITY): Payer: Medicare Other

## 2021-10-01 ENCOUNTER — Inpatient Hospital Stay (HOSPITAL_COMMUNITY)
Admission: EM | Admit: 2021-10-01 | Discharge: 2021-10-06 | DRG: 963 | Disposition: A | Discharge status: Rehabilitation Facility | Payer: Medicare Other | Attending: Family Medicine | Admitting: Family Medicine

## 2021-10-01 ENCOUNTER — Other Ambulatory Visit: Payer: Self-pay

## 2021-10-01 DIAGNOSIS — Z887 Allergy status to serum and vaccine status: Secondary | ICD-10-CM

## 2021-10-01 DIAGNOSIS — Z8719 Personal history of other diseases of the digestive system: Secondary | ICD-10-CM

## 2021-10-01 DIAGNOSIS — M353 Polymyalgia rheumatica: Secondary | ICD-10-CM | POA: Diagnosis present

## 2021-10-01 DIAGNOSIS — I480 Paroxysmal atrial fibrillation: Secondary | ICD-10-CM | POA: Diagnosis present

## 2021-10-01 DIAGNOSIS — Z85828 Personal history of other malignant neoplasm of skin: Secondary | ICD-10-CM | POA: Diagnosis not present

## 2021-10-01 DIAGNOSIS — I517 Cardiomegaly: Secondary | ICD-10-CM | POA: Diagnosis not present

## 2021-10-01 DIAGNOSIS — E875 Hyperkalemia: Secondary | ICD-10-CM | POA: Diagnosis not present

## 2021-10-01 DIAGNOSIS — L97219 Non-pressure chronic ulcer of right calf with unspecified severity: Secondary | ICD-10-CM | POA: Diagnosis present

## 2021-10-01 DIAGNOSIS — S32301A Unspecified fracture of right ilium, initial encounter for closed fracture: Secondary | ICD-10-CM | POA: Diagnosis not present

## 2021-10-01 DIAGNOSIS — N1832 Chronic kidney disease, stage 3b: Secondary | ICD-10-CM | POA: Diagnosis present

## 2021-10-01 DIAGNOSIS — S42294A Other nondisplaced fracture of upper end of right humerus, initial encounter for closed fracture: Secondary | ICD-10-CM

## 2021-10-01 DIAGNOSIS — I495 Sick sinus syndrome: Secondary | ICD-10-CM | POA: Diagnosis present

## 2021-10-01 DIAGNOSIS — W0110XA Fall on same level from slipping, tripping and stumbling with subsequent striking against unspecified object, initial encounter: Secondary | ICD-10-CM | POA: Diagnosis present

## 2021-10-01 DIAGNOSIS — S0003XA Contusion of scalp, initial encounter: Secondary | ICD-10-CM | POA: Diagnosis present

## 2021-10-01 DIAGNOSIS — Z79890 Hormone replacement therapy (postmenopausal): Secondary | ICD-10-CM | POA: Diagnosis not present

## 2021-10-01 DIAGNOSIS — I959 Hypotension, unspecified: Secondary | ICD-10-CM | POA: Diagnosis not present

## 2021-10-01 DIAGNOSIS — N390 Urinary tract infection, site not specified: Secondary | ICD-10-CM | POA: Diagnosis present

## 2021-10-01 DIAGNOSIS — R54 Age-related physical debility: Secondary | ICD-10-CM

## 2021-10-01 DIAGNOSIS — D649 Anemia, unspecified: Secondary | ICD-10-CM

## 2021-10-01 DIAGNOSIS — R110 Nausea: Secondary | ICD-10-CM | POA: Diagnosis not present

## 2021-10-01 DIAGNOSIS — D72829 Elevated white blood cell count, unspecified: Secondary | ICD-10-CM | POA: Diagnosis not present

## 2021-10-01 DIAGNOSIS — W19XXXA Unspecified fall, initial encounter: Secondary | ICD-10-CM | POA: Diagnosis not present

## 2021-10-01 DIAGNOSIS — Z79899 Other long term (current) drug therapy: Secondary | ICD-10-CM

## 2021-10-01 DIAGNOSIS — Z950 Presence of cardiac pacemaker: Secondary | ICD-10-CM

## 2021-10-01 DIAGNOSIS — K5900 Constipation, unspecified: Secondary | ICD-10-CM | POA: Diagnosis present

## 2021-10-01 DIAGNOSIS — Z7982 Long term (current) use of aspirin: Secondary | ICD-10-CM

## 2021-10-01 DIAGNOSIS — I428 Other cardiomyopathies: Secondary | ICD-10-CM | POA: Diagnosis present

## 2021-10-01 DIAGNOSIS — E162 Hypoglycemia, unspecified: Secondary | ICD-10-CM | POA: Diagnosis not present

## 2021-10-01 DIAGNOSIS — K760 Fatty (change of) liver, not elsewhere classified: Secondary | ICD-10-CM | POA: Diagnosis not present

## 2021-10-01 DIAGNOSIS — N179 Acute kidney failure, unspecified: Secondary | ICD-10-CM

## 2021-10-01 DIAGNOSIS — S81811A Laceration without foreign body, right lower leg, initial encounter: Secondary | ICD-10-CM

## 2021-10-01 DIAGNOSIS — M546 Pain in thoracic spine: Secondary | ICD-10-CM | POA: Diagnosis not present

## 2021-10-01 DIAGNOSIS — Z952 Presence of prosthetic heart valve: Secondary | ICD-10-CM | POA: Diagnosis not present

## 2021-10-01 DIAGNOSIS — E890 Postprocedural hypothyroidism: Secondary | ICD-10-CM | POA: Diagnosis present

## 2021-10-01 DIAGNOSIS — S32511D Fracture of superior rim of right pubis, subsequent encounter for fracture with routine healing: Secondary | ICD-10-CM | POA: Diagnosis not present

## 2021-10-01 DIAGNOSIS — I5043 Acute on chronic combined systolic (congestive) and diastolic (congestive) heart failure: Secondary | ICD-10-CM | POA: Diagnosis present

## 2021-10-01 DIAGNOSIS — S42201A Unspecified fracture of upper end of right humerus, initial encounter for closed fracture: Secondary | ICD-10-CM | POA: Diagnosis not present

## 2021-10-01 DIAGNOSIS — S72001A Fracture of unspecified part of neck of right femur, initial encounter for closed fracture: Secondary | ICD-10-CM | POA: Diagnosis present

## 2021-10-01 DIAGNOSIS — M545 Low back pain, unspecified: Secondary | ICD-10-CM | POA: Diagnosis not present

## 2021-10-01 DIAGNOSIS — E7800 Pure hypercholesterolemia, unspecified: Secondary | ICD-10-CM | POA: Diagnosis present

## 2021-10-01 DIAGNOSIS — Z7901 Long term (current) use of anticoagulants: Secondary | ICD-10-CM

## 2021-10-01 DIAGNOSIS — Z800 Family history of malignant neoplasm of digestive organs: Secondary | ICD-10-CM

## 2021-10-01 DIAGNOSIS — Z79891 Long term (current) use of opiate analgesic: Secondary | ICD-10-CM

## 2021-10-01 DIAGNOSIS — M47812 Spondylosis without myelopathy or radiculopathy, cervical region: Secondary | ICD-10-CM | POA: Diagnosis not present

## 2021-10-01 DIAGNOSIS — Z20822 Contact with and (suspected) exposure to covid-19: Secondary | ICD-10-CM | POA: Diagnosis present

## 2021-10-01 DIAGNOSIS — D62 Acute posthemorrhagic anemia: Secondary | ICD-10-CM | POA: Diagnosis present

## 2021-10-01 DIAGNOSIS — R339 Retention of urine, unspecified: Secondary | ICD-10-CM | POA: Diagnosis not present

## 2021-10-01 DIAGNOSIS — Z043 Encounter for examination and observation following other accident: Secondary | ICD-10-CM | POA: Diagnosis not present

## 2021-10-01 DIAGNOSIS — D509 Iron deficiency anemia, unspecified: Secondary | ICD-10-CM | POA: Diagnosis present

## 2021-10-01 DIAGNOSIS — S42211A Unspecified displaced fracture of surgical neck of right humerus, initial encounter for closed fracture: Secondary | ICD-10-CM | POA: Diagnosis present

## 2021-10-01 DIAGNOSIS — K567 Ileus, unspecified: Secondary | ICD-10-CM | POA: Diagnosis not present

## 2021-10-01 DIAGNOSIS — S42201D Unspecified fracture of upper end of right humerus, subsequent encounter for fracture with routine healing: Secondary | ICD-10-CM | POA: Diagnosis not present

## 2021-10-01 DIAGNOSIS — M810 Age-related osteoporosis without current pathological fracture: Secondary | ICD-10-CM | POA: Diagnosis present

## 2021-10-01 DIAGNOSIS — R5381 Other malaise: Secondary | ICD-10-CM | POA: Diagnosis present

## 2021-10-01 DIAGNOSIS — S42201S Unspecified fracture of upper end of right humerus, sequela: Secondary | ICD-10-CM | POA: Diagnosis not present

## 2021-10-01 DIAGNOSIS — E871 Hypo-osmolality and hyponatremia: Secondary | ICD-10-CM | POA: Diagnosis not present

## 2021-10-01 DIAGNOSIS — Z7952 Long term (current) use of systemic steroids: Secondary | ICD-10-CM

## 2021-10-01 DIAGNOSIS — Z882 Allergy status to sulfonamides status: Secondary | ICD-10-CM

## 2021-10-01 DIAGNOSIS — S81811D Laceration without foreign body, right lower leg, subsequent encounter: Secondary | ICD-10-CM | POA: Diagnosis not present

## 2021-10-01 DIAGNOSIS — S299XXA Unspecified injury of thorax, initial encounter: Secondary | ICD-10-CM | POA: Diagnosis not present

## 2021-10-01 DIAGNOSIS — N1831 Chronic kidney disease, stage 3a: Secondary | ICD-10-CM | POA: Diagnosis present

## 2021-10-01 DIAGNOSIS — S32591A Other specified fracture of right pubis, initial encounter for closed fracture: Principal | ICD-10-CM | POA: Diagnosis present

## 2021-10-01 DIAGNOSIS — S32301D Unspecified fracture of right ilium, subsequent encounter for fracture with routine healing: Secondary | ICD-10-CM | POA: Diagnosis not present

## 2021-10-01 DIAGNOSIS — S32501A Unspecified fracture of right pubis, initial encounter for closed fracture: Secondary | ICD-10-CM | POA: Diagnosis not present

## 2021-10-01 DIAGNOSIS — K6289 Other specified diseases of anus and rectum: Secondary | ICD-10-CM | POA: Diagnosis present

## 2021-10-01 DIAGNOSIS — W19XXXS Unspecified fall, sequela: Secondary | ICD-10-CM | POA: Diagnosis not present

## 2021-10-01 DIAGNOSIS — M25551 Pain in right hip: Secondary | ICD-10-CM | POA: Diagnosis present

## 2021-10-01 DIAGNOSIS — M4854XD Collapsed vertebra, not elsewhere classified, thoracic region, subsequent encounter for fracture with routine healing: Secondary | ICD-10-CM | POA: Diagnosis present

## 2021-10-01 DIAGNOSIS — Z881 Allergy status to other antibiotic agents status: Secondary | ICD-10-CM

## 2021-10-01 DIAGNOSIS — I130 Hypertensive heart and chronic kidney disease with heart failure and stage 1 through stage 4 chronic kidney disease, or unspecified chronic kidney disease: Secondary | ICD-10-CM | POA: Diagnosis present

## 2021-10-01 DIAGNOSIS — S42291A Other displaced fracture of upper end of right humerus, initial encounter for closed fracture: Secondary | ICD-10-CM | POA: Diagnosis present

## 2021-10-01 DIAGNOSIS — Y92019 Unspecified place in single-family (private) house as the place of occurrence of the external cause: Secondary | ICD-10-CM

## 2021-10-01 DIAGNOSIS — G8929 Other chronic pain: Secondary | ICD-10-CM | POA: Diagnosis present

## 2021-10-01 DIAGNOSIS — F419 Anxiety disorder, unspecified: Secondary | ICD-10-CM | POA: Diagnosis present

## 2021-10-01 DIAGNOSIS — N189 Chronic kidney disease, unspecified: Secondary | ICD-10-CM | POA: Diagnosis not present

## 2021-10-01 DIAGNOSIS — S32519A Fracture of superior rim of unspecified pubis, initial encounter for closed fracture: Secondary | ICD-10-CM | POA: Diagnosis not present

## 2021-10-01 DIAGNOSIS — I739 Peripheral vascular disease, unspecified: Secondary | ICD-10-CM | POA: Diagnosis not present

## 2021-10-01 DIAGNOSIS — R52 Pain, unspecified: Secondary | ICD-10-CM

## 2021-10-01 DIAGNOSIS — S41111A Laceration without foreign body of right upper arm, initial encounter: Secondary | ICD-10-CM | POA: Diagnosis present

## 2021-10-01 DIAGNOSIS — M25521 Pain in right elbow: Secondary | ICD-10-CM | POA: Diagnosis not present

## 2021-10-01 DIAGNOSIS — S329XXD Fracture of unspecified parts of lumbosacral spine and pelvis, subsequent encounter for fracture with routine healing: Secondary | ICD-10-CM | POA: Diagnosis present

## 2021-10-01 DIAGNOSIS — K219 Gastro-esophageal reflux disease without esophagitis: Secondary | ICD-10-CM | POA: Diagnosis present

## 2021-10-01 DIAGNOSIS — K922 Gastrointestinal hemorrhage, unspecified: Secondary | ICD-10-CM | POA: Diagnosis not present

## 2021-10-01 DIAGNOSIS — S32810S Multiple fractures of pelvis with stable disruption of pelvic ring, sequela: Secondary | ICD-10-CM | POA: Diagnosis not present

## 2021-10-01 DIAGNOSIS — Y92009 Unspecified place in unspecified non-institutional (private) residence as the place of occurrence of the external cause: Secondary | ICD-10-CM | POA: Diagnosis not present

## 2021-10-01 DIAGNOSIS — Z90710 Acquired absence of both cervix and uterus: Secondary | ICD-10-CM | POA: Diagnosis not present

## 2021-10-01 DIAGNOSIS — S32511A Fracture of superior rim of right pubis, initial encounter for closed fracture: Secondary | ICD-10-CM | POA: Diagnosis not present

## 2021-10-01 DIAGNOSIS — I700 Atherosclerosis of aorta: Secondary | ICD-10-CM | POA: Diagnosis not present

## 2021-10-01 DIAGNOSIS — I5033 Acute on chronic diastolic (congestive) heart failure: Secondary | ICD-10-CM | POA: Diagnosis not present

## 2021-10-01 DIAGNOSIS — I5022 Chronic systolic (congestive) heart failure: Secondary | ICD-10-CM | POA: Diagnosis present

## 2021-10-01 DIAGNOSIS — E039 Hypothyroidism, unspecified: Secondary | ICD-10-CM | POA: Diagnosis present

## 2021-10-01 DIAGNOSIS — M4312 Spondylolisthesis, cervical region: Secondary | ICD-10-CM | POA: Diagnosis not present

## 2021-10-01 DIAGNOSIS — Z823 Family history of stroke: Secondary | ICD-10-CM

## 2021-10-01 DIAGNOSIS — K560 Paralytic ileus: Secondary | ICD-10-CM | POA: Diagnosis not present

## 2021-10-01 DIAGNOSIS — R7989 Other specified abnormal findings of blood chemistry: Secondary | ICD-10-CM | POA: Diagnosis not present

## 2021-10-01 DIAGNOSIS — K4090 Unilateral inguinal hernia, without obstruction or gangrene, not specified as recurrent: Secondary | ICD-10-CM | POA: Diagnosis not present

## 2021-10-01 DIAGNOSIS — Z803 Family history of malignant neoplasm of breast: Secondary | ICD-10-CM

## 2021-10-01 DIAGNOSIS — Y929 Unspecified place or not applicable: Secondary | ICD-10-CM | POA: Diagnosis not present

## 2021-10-01 DIAGNOSIS — I10 Essential (primary) hypertension: Secondary | ICD-10-CM | POA: Diagnosis not present

## 2021-10-01 DIAGNOSIS — W19XXXD Unspecified fall, subsequent encounter: Secondary | ICD-10-CM | POA: Diagnosis present

## 2021-10-01 LAB — COMPREHENSIVE METABOLIC PANEL
ALT: 26 U/L (ref 0–44)
AST: 34 U/L (ref 15–41)
Albumin: 2.9 g/dL — ABNORMAL LOW (ref 3.5–5.0)
Alkaline Phosphatase: 37 U/L — ABNORMAL LOW (ref 38–126)
Anion gap: 6 (ref 5–15)
BUN: 56 mg/dL — ABNORMAL HIGH (ref 8–23)
CO2: 20 mmol/L — ABNORMAL LOW (ref 22–32)
Calcium: 7.6 mg/dL — ABNORMAL LOW (ref 8.9–10.3)
Chloride: 112 mmol/L — ABNORMAL HIGH (ref 98–111)
Creatinine, Ser: 1.5 mg/dL — ABNORMAL HIGH (ref 0.44–1.00)
GFR, Estimated: 34 mL/min — ABNORMAL LOW (ref >60)
Glucose, Bld: 86 mg/dL (ref 70–99)
Potassium: 5.8 mmol/L — ABNORMAL HIGH (ref 3.5–5.1)
Sodium: 138 mmol/L (ref 135–145)
Total Bilirubin: 0.3 mg/dL (ref 0.3–1.2)
Total Protein: 4.9 g/dL — ABNORMAL LOW (ref 6.5–8.1)

## 2021-10-01 LAB — CBC WITH DIFFERENTIAL/PLATELET
Abs Immature Granulocytes: 0.07 10*3/uL (ref 0.00–0.07)
Basophils Absolute: 0 10*3/uL (ref 0.0–0.1)
Basophils Relative: 0 %
Eosinophils Absolute: 0.1 10*3/uL (ref 0.0–0.5)
Eosinophils Relative: 1 %
HCT: 22.3 % — ABNORMAL LOW (ref 36.0–46.0)
Hemoglobin: 7 g/dL — ABNORMAL LOW (ref 12.0–15.0)
Immature Granulocytes: 1 %
Lymphocytes Relative: 27 %
Lymphs Abs: 2.9 10*3/uL (ref 0.7–4.0)
MCH: 33 pg (ref 26.0–34.0)
MCHC: 31.4 g/dL (ref 30.0–36.0)
MCV: 105.2 fL — ABNORMAL HIGH (ref 80.0–100.0)
Monocytes Absolute: 1.3 10*3/uL — ABNORMAL HIGH (ref 0.1–1.0)
Monocytes Relative: 12 %
Neutro Abs: 6.2 10*3/uL (ref 1.7–7.7)
Neutrophils Relative %: 59 %
Platelets: 163 10*3/uL (ref 150–400)
RBC: 2.12 MIL/uL — ABNORMAL LOW (ref 3.87–5.11)
RDW: 15.6 % — ABNORMAL HIGH (ref 11.5–15.5)
WBC: 10.5 10*3/uL (ref 4.0–10.5)
nRBC: 0 % (ref 0.0–0.2)

## 2021-10-01 LAB — CBC
HCT: 22 % — ABNORMAL LOW (ref 36.0–46.0)
Hemoglobin: 6.8 g/dL — CL (ref 12.0–15.0)
MCH: 32.9 pg (ref 26.0–34.0)
MCHC: 30.9 g/dL (ref 30.0–36.0)
MCV: 106.3 fL — ABNORMAL HIGH (ref 80.0–100.0)
Platelets: 131 10*3/uL — ABNORMAL LOW (ref 150–400)
RBC: 2.07 MIL/uL — ABNORMAL LOW (ref 3.87–5.11)
RDW: 15.6 % — ABNORMAL HIGH (ref 11.5–15.5)
WBC: 12.5 10*3/uL — ABNORMAL HIGH (ref 4.0–10.5)
nRBC: 0.2 % (ref 0.0–0.2)

## 2021-10-01 LAB — POC OCCULT BLOOD, ED: Fecal Occult Bld: NEGATIVE

## 2021-10-01 LAB — PREPARE RBC (CROSSMATCH)

## 2021-10-01 MED ORDER — METOPROLOL TARTRATE 12.5 MG HALF TABLET
12.5000 mg | ORAL_TABLET | Freq: Four times a day (QID) | ORAL | Status: DC | PRN
  Administered 2021-10-06: 12.5 mg via ORAL
  Filled 2021-10-01: qty 1

## 2021-10-01 MED ORDER — SODIUM CHLORIDE 0.9 % IV SOLN: 10.0000 mL/h | Freq: Once | INTRAVENOUS | Status: DC

## 2021-10-01 MED ORDER — HYDROMORPHONE HCL 1 MG/ML IJ SOLN
0.5000 mg | INTRAMUSCULAR | Status: DC | PRN
  Administered 2021-10-02 – 2021-10-06 (×14): 0.5 mg via INTRAVENOUS
  Filled 2021-10-01 (×14): qty 1

## 2021-10-01 MED ORDER — OMEPRAZOLE MAGNESIUM 20 MG PO TBEC: 20.0000 mg | DELAYED_RELEASE_TABLET | Freq: Two times a day (BID) | ORAL | Status: DC

## 2021-10-01 MED ORDER — HYDRALAZINE HCL 25 MG PO TABS
25.0000 mg | ORAL_TABLET | Freq: Four times a day (QID) | ORAL | Status: DC | PRN
  Administered 2021-10-05 (×3): 25 mg via ORAL
  Filled 2021-10-01 (×3): qty 1

## 2021-10-01 MED ORDER — ATORVASTATIN CALCIUM 40 MG PO TABS
40.0000 mg | ORAL_TABLET | Freq: Every day | ORAL | Status: DC
  Administered 2021-10-02 – 2021-10-06 (×5): 40 mg via ORAL
  Filled 2021-10-01 (×5): qty 1

## 2021-10-01 MED ORDER — SODIUM ZIRCONIUM CYCLOSILICATE 10 G PO PACK
10.0000 g | PACK | Freq: Once | ORAL | Status: AC
  Administered 2021-10-01: 10 g via ORAL
  Filled 2021-10-01: qty 1

## 2021-10-01 MED ORDER — ONDANSETRON 4 MG PO TBDP
4.0000 mg | ORAL_TABLET | Freq: Once | ORAL | Status: AC
  Administered 2021-10-01: 4 mg via ORAL
  Filled 2021-10-01: qty 1

## 2021-10-01 MED ORDER — ALLOPURINOL 100 MG PO TABS
100.0000 mg | ORAL_TABLET | Freq: Every day | ORAL | Status: DC
  Administered 2021-10-03 – 2021-10-06 (×4): 100 mg via ORAL
  Filled 2021-10-01 (×4): qty 1

## 2021-10-01 MED ORDER — LEVOTHYROXINE SODIUM 75 MCG PO TABS
75.0000 ug | ORAL_TABLET | Freq: Every day | ORAL | Status: DC
  Administered 2021-10-02 – 2021-10-06 (×5): 75 ug via ORAL
  Filled 2021-10-01 (×5): qty 1

## 2021-10-01 MED ORDER — PANTOPRAZOLE SODIUM 40 MG PO TBEC
40.0000 mg | DELAYED_RELEASE_TABLET | Freq: Every day | ORAL | Status: DC
  Administered 2021-10-02 – 2021-10-06 (×5): 40 mg via ORAL
  Filled 2021-10-01 (×5): qty 1

## 2021-10-01 MED ORDER — FENTANYL CITRATE PF 50 MCG/ML IJ SOSY
50.0000 ug | PREFILLED_SYRINGE | Freq: Once | INTRAMUSCULAR | Status: AC
  Administered 2021-10-01: 50 ug via INTRAVENOUS
  Filled 2021-10-01: qty 1

## 2021-10-01 MED ORDER — HEPARIN SODIUM (PORCINE) 5000 UNIT/ML IJ SOLN
5000.0000 [IU] | Freq: Two times a day (BID) | INTRAMUSCULAR | Status: DC
  Administered 2021-10-02 – 2021-10-06 (×8): 5000 [IU] via SUBCUTANEOUS
  Filled 2021-10-01 (×8): qty 1

## 2021-10-01 MED ORDER — SENNOSIDES-DOCUSATE SODIUM 8.6-50 MG PO TABS: 1.0000 | ORAL_TABLET | Freq: Every evening | ORAL | Status: DC | PRN

## 2021-10-01 MED ORDER — HYDROCODONE-ACETAMINOPHEN 5-325 MG PO TABS
1.0000 | ORAL_TABLET | Freq: Once | ORAL | Status: AC
  Administered 2021-10-01: 1 via ORAL
  Filled 2021-10-01: qty 1

## 2021-10-01 MED ORDER — BISACODYL 5 MG PO TBEC
5.0000 mg | DELAYED_RELEASE_TABLET | Freq: Every day | ORAL | Status: DC | PRN
  Administered 2021-10-06: 5 mg via ORAL
  Filled 2021-10-01: qty 1

## 2021-10-01 MED ORDER — METHYLPREDNISOLONE 2 MG PO TABS
2.0000 mg | ORAL_TABLET | Freq: Every day | ORAL | Status: DC
Start: 2021-10-01 — End: 2021-10-01

## 2021-10-01 MED ORDER — SODIUM CHLORIDE 0.9 % IV BOLUS
1000.0000 mL | Freq: Once | INTRAVENOUS | Status: AC
  Administered 2021-10-01: 1000 mL via INTRAVENOUS

## 2021-10-01 MED ORDER — FUROSEMIDE 10 MG/ML IJ SOLN
40.0000 mg | Freq: Every day | INTRAMUSCULAR | Status: DC
  Administered 2021-10-02: 40 mg via INTRAVENOUS
  Filled 2021-10-01: qty 4

## 2021-10-01 MED ORDER — HYDROCODONE-ACETAMINOPHEN 5-325 MG PO TABS
1.0000 | ORAL_TABLET | Freq: Four times a day (QID) | ORAL | Status: DC | PRN
  Administered 2021-10-02 – 2021-10-04 (×7): 1 via ORAL
  Filled 2021-10-01 (×7): qty 1

## 2021-10-01 MED ORDER — METHYLPREDNISOLONE 4 MG PO TABS
4.0000 mg | ORAL_TABLET | Freq: Every day | ORAL | Status: DC
  Administered 2021-10-02 – 2021-10-06 (×5): 4 mg via ORAL
  Filled 2021-10-01 (×6): qty 1

## 2021-10-01 MED ORDER — ONDANSETRON HCL 4 MG/2ML IJ SOLN
4.0000 mg | Freq: Four times a day (QID) | INTRAMUSCULAR | Status: DC | PRN
  Administered 2021-10-02 – 2021-10-06 (×5): 4 mg via INTRAVENOUS
  Filled 2021-10-01 (×5): qty 2

## 2021-10-01 MED ORDER — SODIUM ZIRCONIUM CYCLOSILICATE 10 G PO PACK
10.0000 g | PACK | Freq: Two times a day (BID) | ORAL | Status: DC
  Administered 2021-10-02: 10 g via ORAL
  Filled 2021-10-01: qty 1

## 2021-10-01 MED ORDER — IOHEXOL 350 MG/ML SOLN
80.0000 mL | Freq: Once | INTRAVENOUS | Status: AC | PRN
  Administered 2021-10-01: 80 mL via INTRAVENOUS

## 2021-10-01 NOTE — ED Provider Notes (Addendum)
[facility] EMERGENCY DEPARTMENT Provider Note   CSN: [id number] Arrival date & time: 10/01/21  0700     History Chief Complaint  Patient presents with   Pain   Fall    [name] is a 87 y.o. female.  Patient had mechanical fall yesterday.  She is on blood thinner for atrial fibrillation.  She is on Eliquis.  She had CT of her head, face, neck that were unremarkable.  But she did have right humerus fracture.  She was discharged last night and has been dealing with ongoing right shoulder pain from fracture site.  However she has been very weak and unable to walk or do much activities without feeling lightheaded as if she is going to pass out.  She continues to complain of right hip pain.  She somehow has developed a new wound to her right lower extremity.  Unsure if this happened during transport with EMS.  Fairly adamant that she did not have another fall.  The history is provided by the patient.  Weakness Severity:  Moderate Onset quality:  Gradual Duration:  1 day Timing:  Constant Progression:  Unchanged Chronicity:  New Relieved by:  Nothing Worsened by:  Activity Associated symptoms: arthralgias   Associated symptoms: no abdominal pain, no chest pain, no cough, no dysuria, no numbness in extremities, no fever, no loss of consciousness, no seizures, no shortness of breath and no vomiting       Past Medical History:  Diagnosis Date   Allergic rhinitis    Anemia    iron deficient   Aneurysm (HCC)    Anxiety    Basal cell carcinoma    Chronic anemia    Chronic low back pain    Colon polyps    GERD (gastroesophageal reflux disease)    Hiatal hernia    HTN (hypertension)    Hypercholesteremia    Hyperlipidemia    Hypothyroidism    Insomnia    Left heart failure ([place])    Osteoporosis    S/P forteo treatment as of 02/2009   Pacemaker    Polymyalgia ([place])    rheumatica-steroids per Rheum   Systolic heart failure     Patient Active Problem  List   Diagnosis Date Noted   Paroxysmal atrial fibrillation ([place]) 08/31/2020   Secondary hypercoagulable state ([place]) 08/31/2020   Hyponatremia 03/28/2019   AKI (acute kidney injury) ([place])    Cardiomyopathy ([place]) 01/25/2014   Mitral valve disorder 01/25/2014   Combined hyperlipidemia 01/25/2014   Biventricular implantable cardioverter-defibrillator in situ [id number]/94/7654   Chronic systolic heart failure ([place]) 04/24/2011   Hypertension, benign 04/24/2011   Dyslipidemia 04/24/2011    Past Surgical History:  Procedure Laterality Date   ABDOMINAL HYSTERECTOMY     ANGIOPLASTY     BACK SURGERY     CARDIAC CATHETERIZATION  10/16/06   CATARACT EXTRACTION     defibulator implant     EP IMPLANTABLE DEVICE N/A 04/10/2016   Procedure: BiV Pacemaker Insertion CRT-P;  Surgeon: Evans Lance, MD;  Location: [facility];  Service: Cardiovascular;  Laterality: N/A;   FOOT SURGERY     MITRAL VALVE REPAIR     skin cancer removal     THYROIDECTOMY     TONSILLECTOMY       OB History   No obstetric history on file.     Family History  Problem Relation Age of Onset   Stroke Mother        in her  41's   Cancer Father 13       secondary   Colon cancer Brother    Colon cancer Sister    Breast cancer Sister     Social History   Tobacco Use   Smoking status: Never   Smokeless tobacco: Never  Vaping Use   Vaping Use: Never used  Substance Use Topics   Alcohol use: No   Drug use: No    Home Medications Prior to Admission medications   Medication Sig Start Date End Date Taking? Authorizing Provider  allopurinol (ZYLOPRIM) 100 MG tablet Take 100 mg by mouth daily.    [provider]  apixaban (ELIQUIS) 2.5 MG TABS tablet Take 1 tablet (2.5 mg total) by mouth 2 (two) times daily. 01/02/21   Evans Lance, MD  atorvastatin (LIPITOR) 40 MG tablet Take 40 mg by mouth daily. 04/06/15   [provider]  carvedilol (COREG) 25 MG tablet Take 1 tablet (25 mg total) by  mouth 2 (two) times daily. 01/02/21   Evans Lance, MD  Cholecalciferol (VITAMIN D3) 1000 UNITS CAPS Take 1,000 Units by mouth daily.     [provider]  furosemide (LASIX) 40 MG tablet Take 1 tablet by mouth daily. 12/01/19   [provider]  HYDROcodone-acetaminophen (NORCO/VICODIN) 5-325 MG per tablet Take 1 tablet by mouth every 6 (six) hours as needed (For pain.).  03/30/15   [provider]  levothyroxine (SYNTHROID, LEVOTHROID) 75 MCG tablet Take 1 tablet (75 mcg total) by mouth daily. 11/03/14   Jettie Booze, MD  lisinopril (ZESTRIL) 20 MG tablet Take 1 tablet (20 mg total) by mouth daily. 01/20/21   Evans Lance, MD  methylPREDNISolone (MEDROL) 2 MG tablet Take 2 mg by mouth daily.     [provider]  omeprazole (PRILOSEC OTC) 20 MG tablet Take 20 mg by mouth 2 (two) times daily.    [provider]  potassium chloride SA (KLOR-CON) 20 MEQ tablet Take 1 tablet by mouth daily. 10/15/19   [provider]  sodium zirconium cyclosilicate (LOKELMA) 5 g packet Take 10 g by mouth 2 (two) times daily for 1 day. 09/30/21 10/01/21  Gareth Morgan, MD    Allergies    Pertussis vaccines, Sulfa antibiotics, and Amoxicillin  Review of Systems   Review of Systems  Constitutional:  Positive for fatigue. Negative for chills and fever.  HENT:  Negative for ear pain and sore throat.   Eyes:  Negative for pain and visual disturbance.  Respiratory:  Negative for cough and shortness of breath.   Cardiovascular:  Negative for chest pain and palpitations.  Gastrointestinal:  Negative for abdominal pain and vomiting.  Genitourinary:  Negative for dysuria and hematuria.  Musculoskeletal:  Positive for arthralgias and gait problem. Negative for back pain.  Skin:  Positive for color change and wound. Negative for rash.  Neurological:  Positive for syncope (near), weakness and light-headedness. Negative for seizures and loss of consciousness.   Hematological:  Bruises/bleeds easily.  All other systems reviewed and are negative.  Physical Exam Updated Vital Signs BP (!) 136/37   Pulse 71   Temp 98.2 F (36.8 C)   Resp 14   SpO2 100%   Physical Exam Vitals and nursing note reviewed.  Constitutional:      General: She is not in acute distress.    Appearance: She is well-developed. She is ill-appearing.  HENT:     Head: Normocephalic and atraumatic.     Nose:  Nose normal.     Mouth/Throat:     Mouth: Mucous membranes are moist.  Eyes:     Extraocular Movements: Extraocular movements intact.     Conjunctiva/sclera: Conjunctivae normal.     Pupils: Pupils are equal, round, and reactive to light.  Cardiovascular:     Rate and Rhythm: Normal rate and regular rhythm.     Heart sounds: No murmur heard. Pulmonary:     Effort: Pulmonary effort is normal. No respiratory distress.     Breath sounds: Normal breath sounds.  Abdominal:     Palpations: Abdomen is soft.     Tenderness: There is no abdominal tenderness.  Genitourinary:    Rectum: Guaiac result negative.  Musculoskeletal:     Cervical back: Normal range of motion and neck supple.  Skin:    General: Skin is warm and dry.     Capillary Refill: Capillary refill takes less than 2 seconds.     Findings: Bruising present.     Comments: Significant bruising to the face and arms, significant skin avulsion to the right shin with some subcutaneous tissue exposed but hemostatic  Neurological:     General: No focal deficit present.     Mental Status: She is alert.    ED Results / Procedures / Treatments   Labs (all labs ordered are listed, but only abnormal results are displayed) Labs Reviewed  COMPREHENSIVE METABOLIC PANEL - Abnormal; Notable for the following components:      Result Value   Potassium 5.8 (*)    Chloride 112 (*)    CO2 20 (*)    BUN 56 (*)    Creatinine, Ser 1.50 (*)    Calcium 7.6 (*)    Total Protein 4.9 (*)    Albumin 2.9 (*)    Alkaline  Phosphatase 37 (*)    GFR, Estimated 34 (*)    All other components within normal limits  CBC WITH DIFFERENTIAL/PLATELET - Abnormal; Notable for the following components:   RBC 2.12 (*)    Hemoglobin 7.0 (*)    HCT 22.3 (*)    MCV 105.2 (*)    RDW 15.6 (*)    Monocytes Absolute 1.3 (*)    All other components within normal limits  CBC - Abnormal; Notable for the following components:   WBC 12.5 (*)    RBC 2.07 (*)    Hemoglobin 6.8 (*)    HCT 22.0 (*)    MCV 106.3 (*)    RDW 15.6 (*)    Platelets 131 (*)    All other components within normal limits  RESP PANEL BY RT-PCR (FLU A&B, COVID) [id number]  URINE CULTURE  URINALYSIS, ROUTINE W REFLEX MICROSCOPIC  POC OCCULT BLOOD, ED  TYPE AND SCREEN  PREPARE RBC (CROSSMATCH)    EKG EKG Interpretation  Date/Time:  Sunday October 01 2021 12:58:39 EST Ventricular Rate:  70 PR Interval:  72 QRS Duration: 128 QT Interval:  467 QTC Calculation: 504 R Axis:   161 Text Interpretation: Sinus rhythm Short PR interval Nonspecific intraventricular conduction delay lead missing Confirmed by Lennice Sites ([id number]) on 10/01/2021 1:32:14 PM  Radiology DG Elbow Complete Right  Result Date: 09/30/2021 CLINICAL DATA:  Fall EXAM: RIGHT ELBOW - COMPLETE 3+ VIEW COMPARISON:  None. FINDINGS: There is no evidence of fracture, dislocation, or joint effusion. There is no evidence of arthropathy or other focal bone abnormality. Soft tissues are unremarkable. IMPRESSION: Negative. Electronically Signed   By: Franchot Gallo M.D.   On: 09/30/2021  17:20   CT HEAD WO CONTRAST (5MM)  Result Date: 10/01/2021 CLINICAL DATA:  Poly trauma.  Post fall. EXAM: CT HEAD WITHOUT CONTRAST CT CERVICAL SPINE WITHOUT CONTRAST TECHNIQUE: Multidetector CT imaging of the head and cervical spine was performed following the standard protocol without intravenous contrast. Multiplanar CT image reconstructions of the cervical spine were also generated. COMPARISON:  None. FINDINGS: CT  HEAD FINDINGS Brain: No evidence of acute infarction, hemorrhage, hydrocephalus, extra-axial collection or mass lesion/mass effect. Moderate brain parenchymal volume and microangiopathy. Vascular: No hyperdense vessel or unexpected calcification. Skull: Normal. Negative for fracture or focal lesion. Sinuses/Orbits: No acute finding. Other: Right frontal scalp hematoma. CT CERVICAL SPINE FINDINGS Alignment: 1-2 mm the anterolisthesis of C4 on C5 and 1-2 mm posterior listhesis of C5 on C6, likely due to degenerative facet arthropathy. Skull base and vertebrae: No acute fracture. No primary bone lesion or focal pathologic process. Soft tissues and spinal canal: No prevertebral fluid or swelling. No visible canal hematoma. Disc levels:  Multilevel osteoarthritic changes. Upper chest: Negative. Other: None. IMPRESSION: 1. No acute intracranial abnormality. 2. Moderate brain parenchymal atrophy and microangiopathy. 3. No evidence of acute traumatic injury to cervical spine. 4. Multilevel osteoarthritic changes of the cervical spine. 5. 1-2 mm anterolisthesis of C4 on C5 and 1-2 mm posterior listhesis of C5 on C6, likely due to degenerative facet arthropathy. Electronically Signed   By: Fidela Salisbury M.D.   On: 10/01/2021 14:44   CT HEAD WO CONTRAST  Result Date: 09/30/2021 CLINICAL DATA:  Facial trauma with right orbital bruising. EXAM: CT HEAD WITHOUT CONTRAST CT MAXILLOFACIAL WITHOUT CONTRAST CT CERVICAL SPINE WITHOUT CONTRAST TECHNIQUE: Multidetector CT imaging of the head, cervical spine, and maxillofacial structures were performed using the standard protocol without intravenous contrast. Multiplanar CT image reconstructions of the cervical spine and maxillofacial structures were also generated. COMPARISON:  CT scan of the brain June 14, 2015 FINDINGS: CT HEAD FINDINGS Brain: No evidence of acute infarction, hemorrhage, hydrocephalus, extra-axial collection or mass lesion/mass effect. Vascular: Calcified  atherosclerosis in the intracranial carotids. Skull: Normal. Negative for fracture or focal lesion. Other: Hematoma and soft tissue swelling in the right supraorbital region. Air in the soft tissues suggests laceration. CT MAXILLOFACIAL FINDINGS Osseous: No fracture or mandibular dislocation. No destructive process. Orbits: Negative. No traumatic or inflammatory finding. Sinuses: Clear. Soft tissues: Soft tissue swelling and hematoma in the right Peri orbital and supraorbital region. Air in the soft tissues consistent with laceration. The underlying globe is in tact. No other soft tissue abnormalities are identified. CT CERVICAL SPINE FINDINGS Alignment: Trace retrolisthesis of C2 versus C3, not seen in 2016. Anterolisthesis of C4 versus C5, unchanged since 2016. Reversal of normal lordosis centered at C5-C6, unchanged since 2016. No other significant malalignment. Skull base and vertebrae: No acute fracture. No primary bone lesion or focal pathologic process. Soft tissues and spinal canal: No prevertebral soft tissue swelling. No other soft tissue abnormalities. Disc levels: Multilevel degenerative disc disease. Facet degenerative changes. Upper chest: Scarring in the apices. No acute abnormalities in the upper lungs. Other: Fracture of the proximal right humerus seen on the scout view. No other abnormalities. IMPRESSION: 1. No acute intracranial abnormalities. 2. Hematoma and soft tissue swelling in the right supraorbital region. No facial bone fractures. 3. Trace retrolisthesis of C2 versus C3, anterolisthesis of C4 versus C5, and reversal of normal lordosis at C5-6 is favored to be nonacute with no adjacent soft tissue swelling. The findings at C4-5 and C5-6 are unchanged since 2016.  Recommend clinical correlation. 4. No fracture in the cervical spine. 5. Fracture of the proximal right humerus, better assessed on the dedicated right shoulder x-ray. Electronically Signed   By: Dorise Bullion III M.D.   On:  09/30/2021 17:22   CT Cervical Spine Wo Contrast  Result Date: 10/01/2021 CLINICAL DATA:  Poly trauma.  Post fall. EXAM: CT HEAD WITHOUT CONTRAST CT CERVICAL SPINE WITHOUT CONTRAST TECHNIQUE: Multidetector CT imaging of the head and cervical spine was performed following the standard protocol without intravenous contrast. Multiplanar CT image reconstructions of the cervical spine were also generated. COMPARISON:  None. FINDINGS: CT HEAD FINDINGS Brain: No evidence of acute infarction, hemorrhage, hydrocephalus, extra-axial collection or mass lesion/mass effect. Moderate brain parenchymal volume and microangiopathy. Vascular: No hyperdense vessel or unexpected calcification. Skull: Normal. Negative for fracture or focal lesion. Sinuses/Orbits: No acute finding. Other: Right frontal scalp hematoma. CT CERVICAL SPINE FINDINGS Alignment: 1-2 mm the anterolisthesis of C4 on C5 and 1-2 mm posterior listhesis of C5 on C6, likely due to degenerative facet arthropathy. Skull base and vertebrae: No acute fracture. No primary bone lesion or focal pathologic process. Soft tissues and spinal canal: No prevertebral fluid or swelling. No visible canal hematoma. Disc levels:  Multilevel osteoarthritic changes. Upper chest: Negative. Other: None. IMPRESSION: 1. No acute intracranial abnormality. 2. Moderate brain parenchymal atrophy and microangiopathy. 3. No evidence of acute traumatic injury to cervical spine. 4. Multilevel osteoarthritic changes of the cervical spine. 5. 1-2 mm anterolisthesis of C4 on C5 and 1-2 mm posterior listhesis of C5 on C6, likely due to degenerative facet arthropathy. Electronically Signed   By: Fidela Salisbury M.D.   On: 10/01/2021 14:44   CT CERVICAL SPINE WO CONTRAST  Result Date: 09/30/2021 CLINICAL DATA:  Facial trauma with right orbital bruising. EXAM: CT HEAD WITHOUT CONTRAST CT MAXILLOFACIAL WITHOUT CONTRAST CT CERVICAL SPINE WITHOUT CONTRAST TECHNIQUE: Multidetector CT imaging of the  head, cervical spine, and maxillofacial structures were performed using the standard protocol without intravenous contrast. Multiplanar CT image reconstructions of the cervical spine and maxillofacial structures were also generated. COMPARISON:  CT scan of the brain June 14, 2015 FINDINGS: CT HEAD FINDINGS Brain: No evidence of acute infarction, hemorrhage, hydrocephalus, extra-axial collection or mass lesion/mass effect. Vascular: Calcified atherosclerosis in the intracranial carotids. Skull: Normal. Negative for fracture or focal lesion. Other: Hematoma and soft tissue swelling in the right supraorbital region. Air in the soft tissues suggests laceration. CT MAXILLOFACIAL FINDINGS Osseous: No fracture or mandibular dislocation. No destructive process. Orbits: Negative. No traumatic or inflammatory finding. Sinuses: Clear. Soft tissues: Soft tissue swelling and hematoma in the right Peri orbital and supraorbital region. Air in the soft tissues consistent with laceration. The underlying globe is in tact. No other soft tissue abnormalities are identified. CT CERVICAL SPINE FINDINGS Alignment: Trace retrolisthesis of C2 versus C3, not seen in 2016. Anterolisthesis of C4 versus C5, unchanged since 2016. Reversal of normal lordosis centered at C5-C6, unchanged since 2016. No other significant malalignment. Skull base and vertebrae: No acute fracture. No primary bone lesion or focal pathologic process. Soft tissues and spinal canal: No prevertebral soft tissue swelling. No other soft tissue abnormalities. Disc levels: Multilevel degenerative disc disease. Facet degenerative changes. Upper chest: Scarring in the apices. No acute abnormalities in the upper lungs. Other: Fracture of the proximal right humerus seen on the scout view. No other abnormalities. IMPRESSION: 1. No acute intracranial abnormalities. 2. Hematoma and soft tissue swelling in the right supraorbital region. No facial bone fractures.  3. Trace  retrolisthesis of C2 versus C3, anterolisthesis of C4 versus C5, and reversal of normal lordosis at C5-6 is favored to be nonacute with no adjacent soft tissue swelling. The findings at C4-5 and C5-6 are unchanged since 2016. Recommend clinical correlation. 4. No fracture in the cervical spine. 5. Fracture of the proximal right humerus, better assessed on the dedicated right shoulder x-ray. Electronically Signed   By: Dorise Bullion III M.D.   On: 09/30/2021 17:22   CT CHEST ABDOMEN PELVIS W CONTRAST  Result Date: 10/01/2021 CLINICAL DATA:  Fall down stairs. Blunt trauma. Right-sided chest and abdominal pain. Initial encounter. EXAM: CT CHEST, ABDOMEN, AND PELVIS WITH CONTRAST TECHNIQUE: Multidetector CT imaging of the chest, abdomen and pelvis was performed following the standard protocol during bolus administration of intravenous contrast. CONTRAST:  47mL OMNIPAQUE IOHEXOL 350 MG/ML SOLN COMPARISON:  None. FINDINGS: CT CHEST FINDINGS Cardiovascular: No evidence of thoracic aortic injury or mediastinal hematoma. No pericardial effusion. Mild-to-moderate cardiomegaly. Pacemaker leads are seen in expected position, and prosthetic mitral valve also noted. Mediastinum/Nodes: No evidence of hemorrhage or pneumomediastinum. No masses or pathologically enlarged lymph nodes identified. Lungs/Pleura: No evidence of pulmonary contusion or other infiltrate. No evidence of pneumothorax or hemothorax. Musculoskeletal: No acute fractures or suspicious bone lesions identified. Old compression fractures and multiple vertebroplasties are noted in the lower thoracic spine. CT ABDOMEN PELVIS FINDINGS Hepatobiliary: No hepatic laceration or mass identified. Mild diffuse hepatic steatosis is noted. Gallbladder is unremarkable. No evidence of biliary ductal dilatation. Pancreas: No parenchymal laceration, mass, or inflammatory changes identified. Spleen: No evidence of splenic laceration. Adrenal/Urinary Tract: No hemorrhage or  parenchymal lacerations identified. No evidence of mass or hydronephrosis. Unremarkable unopacified urinary bladder. Stomach/Bowel: Unopacified bowel loops are unremarkable in appearance. No evidence of hemoperitoneum. Vascular/Lymphatic: No evidence of abdominal aortic injury or retroperitoneal hemorrhage. Aortic atherosclerotic calcification noted. No pathologically enlarged lymph nodes identified. Reproductive: Prior hysterectomy noted. Adnexal regions are unremarkable in appearance. Other: A small right inguinal hernia is seen which contains a loop of small bowel. No evidence of bowel obstruction or strangulation. Musculoskeletal: A nondisplaced fracture is seen involving the right pubis and superior pubic ramus. Multiple previous lumbar vertebroplasties noted. IMPRESSION: Nondisplaced fracture of right pubis and superior pubic ramus. No evidence of internal organ injury. Small right inguinal hernia containing a loop of small bowel. No evidence of bowel obstruction or strangulation. Mild hepatic steatosis. Aortic Atherosclerosis (ICD10-I70.0). Electronically Signed   By: Marlaine Hind M.D.   On: 10/01/2021 14:53   CT T-SPINE NO CHARGE  Result Date: 10/01/2021 CLINICAL DATA:  Pain.  Fall today. EXAM: CT THORACIC AND LUMBAR SPINE WITH CONTRAST TECHNIQUE: Multiplanar CT images of the thoracic and lumbar spine were reconstructed from contemporary CT of the Chest, Abdomen, and Pelvis CONTRAST:  No additional COMPARISON:  Chest radiographs 04/21/2019. Lumbar spine radiographs 04/08/2013. Lumbar spine MRI 03/05/2007. FINDINGS: CT THORACIC SPINE FINDINGS Alignment: Chronically exaggerated thoracic kyphosis. No significant listhesis in the thoracic spine. Vertebrae: Chronic, previously augmented T10, T11, and T12 compression fractures. Chronic, mild T4, mild-to-moderate T5, and moderate T8 and T9 compression fractures, all similar to the 2020 chest radiographs. Schmorl's nodes at T2, T3, T6, and T7. No definite acute  fracture or suspicious osseous lesion. Paraspinal and other soft tissues: No acute abnormality identified in the paraspinal soft tissues. Intrathoracic contents reported separately. Disc levels: Mild thoracic spondylosis without evidence of high-grade stenosis. CT LUMBAR SPINE FINDINGS Segmentation: 5 lumbar type vertebrae. Alignment: Normal. Vertebrae: Chronic, previously augmented L1 and  L4 compression fractures. Preserved heights of the other lumbar vertebral bodies without an acute fracture or suspicious osseous lesion identified. Paraspinal and other soft tissues: No acute abnormality identified in the paraspinal soft tissues. Intra-abdominal and pelvic contents reported separately. Disc levels: Mild bilateral neural foraminal stenosis at L3-4 and L4-5 due to disc bulging and mild facet arthrosis. No high-grade spinal stenosis. IMPRESSION: 1. No evidence of acute osseous abnormality in the thoracic or lumbar spine. 2. Numerous chronic compression fractures. Electronically Signed   By: Logan Bores M.D.   On: 10/01/2021 14:28   CT L-SPINE NO CHARGE  Result Date: 10/01/2021 CLINICAL DATA:  Pain.  Fall today. EXAM: CT THORACIC AND LUMBAR SPINE WITH CONTRAST TECHNIQUE: Multiplanar CT images of the thoracic and lumbar spine were reconstructed from contemporary CT of the Chest, Abdomen, and Pelvis CONTRAST:  No additional COMPARISON:  Chest radiographs 04/21/2019. Lumbar spine radiographs 04/08/2013. Lumbar spine MRI 03/05/2007. FINDINGS: CT THORACIC SPINE FINDINGS Alignment: Chronically exaggerated thoracic kyphosis. No significant listhesis in the thoracic spine. Vertebrae: Chronic, previously augmented T10, T11, and T12 compression fractures. Chronic, mild T4, mild-to-moderate T5, and moderate T8 and T9 compression fractures, all similar to the 2020 chest radiographs. Schmorl's nodes at T2, T3, T6, and T7. No definite acute fracture or suspicious osseous lesion. Paraspinal and other soft tissues: No acute  abnormality identified in the paraspinal soft tissues. Intrathoracic contents reported separately. Disc levels: Mild thoracic spondylosis without evidence of high-grade stenosis. CT LUMBAR SPINE FINDINGS Segmentation: 5 lumbar type vertebrae. Alignment: Normal. Vertebrae: Chronic, previously augmented L1 and L4 compression fractures. Preserved heights of the other lumbar vertebral bodies without an acute fracture or suspicious osseous lesion identified. Paraspinal and other soft tissues: No acute abnormality identified in the paraspinal soft tissues. Intra-abdominal and pelvic contents reported separately. Disc levels: Mild bilateral neural foraminal stenosis at L3-4 and L4-5 due to disc bulging and mild facet arthrosis. No high-grade spinal stenosis. IMPRESSION: 1. No evidence of acute osseous abnormality in the thoracic or lumbar spine. 2. Numerous chronic compression fractures. Electronically Signed   By: Logan Bores M.D.   On: 10/01/2021 14:28   DG Chest Portable 1 View  Result Date: 10/01/2021 CLINICAL DATA:  Fall.  Nausea.  Oliguria.  Bruising.  Pain. EXAM: PORTABLE CHEST 1 VIEW COMPARISON:  Chest radiograph 04/21/2019 FINDINGS: AICD noted. Mitral valve prosthesis. The patient is rotated to the left on today's radiograph, reducing diagnostic sensitivity and specificity. Atherosclerotic calcification of the aortic arch. Lower thoracic vertebral augmentations. Bony demineralization. The visualized lungs appear clear, part of the left lung base is obscured by the pulse generator. Mild enlargement of the cardiopericardial silhouette. Discontinuity in the proximal right humerus compatible with fracture. IMPRESSION: 1. No acute findings. 2. Mild enlargement of the cardiopericardial silhouette. 3. AICD and mitral valve prosthesis noted. 4. Fracture of the right proximal humerus partially included on today's exam. Electronically Signed   By: Van Clines M.D.   On: 10/01/2021 12:35   DG Shoulder Right  Port  Result Date: 09/30/2021 CLINICAL DATA:  Fall EXAM: PORTABLE RIGHT SHOULDER COMPARISON:  None. FINDINGS: Transverse fracture of the humeral neck. Fracture of the greater tuberosity. No dislocation. Multiple thoracic fractures with vertebroplasty cement. IMPRESSION: Fracture humeral neck and greater tuberosity Electronically Signed   By: Franchot Gallo M.D.   On: 09/30/2021 17:21   DG Tibia/Fibula Right Port  Result Date: 10/01/2021 CLINICAL DATA:  Fall yesterday. Worsening pain. Laceration along the anterior shin. EXAM: PORTABLE RIGHT TIBIA AND FIBULA - 2 VIEW  COMPARISON:  Knee radiographs from 08/12/2015 FINDINGS: Vascular calcifications noted. Diffuse subcutaneous edema. No tibial or fibular fracture is identified. Lucency along the soft tissues the anterior shin probably reflecting laceration. IMPRESSION: 1. Laceration along the anterior shin, without underlying fracture. 2. Vascular calcifications. 3. Subcutaneous edema diffusely along the calf. Electronically Signed   By: Van Clines M.D.   On: 10/01/2021 12:37   DG Humerus Right  Result Date: 09/30/2021 CLINICAL DATA:  Fall EXAM: RIGHT HUMERUS - 2+ VIEW COMPARISON:  None. FINDINGS: Fracture right humeral neck with mild displacement. Fracture extends into the greater tuberosity of the humerus. Normal alignment of the humeral head. IMPRESSION: Transverse fracture proximal humeral neck. Fracture greater tuberosity. Electronically Signed   By: Franchot Gallo M.D.   On: 09/30/2021 17:18   DG Hip Unilat W or Wo Pelvis 2-3 Views Right  Result Date: 09/30/2021 CLINICAL DATA:  Fall EXAM: DG HIP (WITH OR WITHOUT PELVIS) 2-3V RIGHT COMPARISON:  None. FINDINGS: There is no evidence of hip fracture or dislocation. There is no evidence of arthropathy or other focal bone abnormality. Arterial calcification. Prior fracture of L4 with vertebroplasty cement. IMPRESSION: Negative. Electronically Signed   By: Franchot Gallo M.D.   On: 09/30/2021 17:19    CT MAXILLOFACIAL WO CONTRAST  Result Date: 09/30/2021 CLINICAL DATA:  Facial trauma with right orbital bruising. EXAM: CT HEAD WITHOUT CONTRAST CT MAXILLOFACIAL WITHOUT CONTRAST CT CERVICAL SPINE WITHOUT CONTRAST TECHNIQUE: Multidetector CT imaging of the head, cervical spine, and maxillofacial structures were performed using the standard protocol without intravenous contrast. Multiplanar CT image reconstructions of the cervical spine and maxillofacial structures were also generated. COMPARISON:  CT scan of the brain June 14, 2015 FINDINGS: CT HEAD FINDINGS Brain: No evidence of acute infarction, hemorrhage, hydrocephalus, extra-axial collection or mass lesion/mass effect. Vascular: Calcified atherosclerosis in the intracranial carotids. Skull: Normal. Negative for fracture or focal lesion. Other: Hematoma and soft tissue swelling in the right supraorbital region. Air in the soft tissues suggests laceration. CT MAXILLOFACIAL FINDINGS Osseous: No fracture or mandibular dislocation. No destructive process. Orbits: Negative. No traumatic or inflammatory finding. Sinuses: Clear. Soft tissues: Soft tissue swelling and hematoma in the right Peri orbital and supraorbital region. Air in the soft tissues consistent with laceration. The underlying globe is in tact. No other soft tissue abnormalities are identified. CT CERVICAL SPINE FINDINGS Alignment: Trace retrolisthesis of C2 versus C3, not seen in 2016. Anterolisthesis of C4 versus C5, unchanged since 2016. Reversal of normal lordosis centered at C5-C6, unchanged since 2016. No other significant malalignment. Skull base and vertebrae: No acute fracture. No primary bone lesion or focal pathologic process. Soft tissues and spinal canal: No prevertebral soft tissue swelling. No other soft tissue abnormalities. Disc levels: Multilevel degenerative disc disease. Facet degenerative changes. Upper chest: Scarring in the apices. No acute abnormalities in the upper lungs.  Other: Fracture of the proximal right humerus seen on the scout view. No other abnormalities. IMPRESSION: 1. No acute intracranial abnormalities. 2. Hematoma and soft tissue swelling in the right supraorbital region. No facial bone fractures. 3. Trace retrolisthesis of C2 versus C3, anterolisthesis of C4 versus C5, and reversal of normal lordosis at C5-6 is favored to be nonacute with no adjacent soft tissue swelling. The findings at C4-5 and C5-6 are unchanged since 2016. Recommend clinical correlation. 4. No fracture in the cervical spine. 5. Fracture of the proximal right humerus, better assessed on the dedicated right shoulder x-ray. Electronically Signed   By: Dorise Bullion III M.D.  On: 09/30/2021 17:22    Procedures .Marland KitchenLaceration Repair  Date/Time: 10/01/2021 1:41 PM Performed by: Lennice Sites, DO Authorized by: Lennice Sites, DO   Consent:    Consent obtained:  Verbal   Consent given by:  Patient   Risks, benefits, and alternatives were discussed: yes     Risks discussed:  Infection, need for additional repair, nerve damage, retained foreign body, pain, poor cosmetic result, poor wound healing, tendon damage and vascular damage Universal protocol:    Procedure explained and questions answered to patient or proxy's satisfaction: yes     Relevant documents present and verified: yes     Test results available: yes     Imaging studies available: yes     Patient identity confirmed:  Verbally with patient Anesthesia:    Anesthesia method:  None Laceration details:    Location:  Leg   Leg location:  R lower leg   Length (cm):  11   Depth (mm):  3 Pre-procedure details:    Preparation:  Patient was prepped and draped in usual sterile fashion and imaging obtained to evaluate for foreign bodies Exploration:    Limited defect created (wound extended): no     Hemostasis achieved with:  Direct pressure   Imaging obtained: x-ray     Imaging outcome: foreign body not noted     Wound  exploration: wound explored through full range of motion and entire depth of wound visualized     Wound extent: no fascia violation noted, no foreign bodies/material noted, no muscle damage noted, no nerve damage noted, no tendon damage noted and no vascular damage noted     Wound extent comment:  Subcuntaneous fat partially involved   Contaminated: no   Treatment:    Area cleansed with:  Saline   Amount of cleaning:  Extensive   Irrigation solution:  Sterile saline   Irrigation volume:  1000   Irrigation method:  Pressure wash   Visualized foreign bodies/material removed: no     Debridement:  None   Undermining:  None   Scar revision: no   Skin repair:    Repair method:  Staples   Number of staples:  22 Approximation:    Approximation:  Close Repair type:    Repair type:  Simple Post-procedure details:    Dressing:  Non-adherent dressing   Procedure completion:  Tolerated .Critical Care Performed by: Lennice Sites, DO Authorized by: Lennice Sites, DO   Critical care provider statement:    Critical care time (minutes):  35   Critical care was necessary to treat or prevent imminent or life-threatening deterioration of the following conditions:  Circulatory failure   Critical care was time spent personally by me on the following activities:  Blood draw for specimens, development of treatment plan with patient or surrogate, ordering and performing treatments and interventions, ordering and review of laboratory studies, ordering and review of radiographic studies, re-evaluation of patient's condition, pulse oximetry, review of old charts, obtaining history from patient or surrogate, discussions with primary provider, evaluation of patient's response to treatment and examination of patient   I assumed direction of critical care for this patient from another provider in my specialty: no     Care discussed with: admitting provider     Medications Ordered in ED Medications  0.9 %   sodium chloride infusion (has no administration in time range)  ondansetron (ZOFRAN-ODT) disintegrating tablet 4 mg (4 mg Oral Given 10/01/21 1050)  sodium zirconium cyclosilicate (LOKELMA) packet 10 g (10  g Oral Given 10/01/21 1138)  sodium chloride 0.9 % bolus 1,000 mL (1,000 mLs Intravenous New Bag/Given 10/01/21 1349)  fentaNYL (SUBLIMAZE) injection 50 mcg (50 mcg Intravenous Given 10/01/21 1349)  HYDROcodone-acetaminophen (NORCO/VICODIN) 5-325 MG per tablet 1 tablet (1 tablet Oral Given 10/01/21 1137)  iohexol (OMNIPAQUE) 350 MG/ML injection 80 mL (80 mLs Intravenous Contrast Given 10/01/21 1308)    ED Course  I have reviewed the triage vital signs and the nursing notes.  Pertinent labs & imaging results that were available during my care of the patient were reviewed by me and considered in my medical decision making (see chart for details).    MDM Rules/Calculators/A&P                           Rhema KIMORAH Thomas is here with near syncope/ongoing weakness, ongoing right hip and right shoulder pain.  Normal vitals.  No fever.  Was seen here yesterday after mechanical fall in which she broke her right humerus.  Patient is on Eliquis.  She continues to have pain in her right hip.  Right shoulder.  She has been very lightheaded and unable to get around without feeling like she is in a pass out.  She spent the night with family but too weak and too tired to get up this morning.  She denies any abdominal pain, nausea or vomiting.  At some point she has suffered bad skin tear to the right shin.  Not sure if this happened with EMS transport or while in the waiting room.  They are fairly adamant that there was not a new fall.  She has significant bruising to the right face.  Laceration/skin avulsion to the right lower shin was repaired with staples after being washed out.  She states that her tetanus shot was updated fairly recently within the last 10 years.  She does have allergy to pertussis vaccines.  She  did have mildly elevated potassium at 5.5 yesterday.  Repeat lab work has been collected prior to my evaluation.  Hemoglobin is down from 9.4 to 7.  Stool is brown.  Hemoccult is negative.  She does have bruising to the right hip area/gluteal area.  Potassium remains elevated at 5.8.  EKG shows sinus rhythm.  No obvious hyperkalemic changes.  We will give an additional dose of Lokelma.  Will give fluid bolus.  We will get repeat CBC since it has been about 4 hours to evaluate for severity of bleed.  Hemoglobin is 6.8.  Will transfuse 1 unit of blood.  Awaiting CT scans of her head, neck, chest, abdomen, pelvis.  Will rule out intrathoracic or intra-abdominal bleed.  These images were not done yesterday however patient is not complaining of any chest or abdominal pain.  CT scan showed no evidence of internal bleeding.  There are nondisplaced fractures of the right pubis and superior pubic ramus.  Overall symptomatic anemia likely in the setting of some chronic CKD.  Hyperkalemia likely from the same.  Has been given IV fluids and Lokelma.  Will need admission for blood transfusion and further anemia work-up.  Likely will need short-term placement/PT/OT.  Dr. Griffin Basil was consulted yesterday for humerus fracture.  Pelvic fractures are most likely nonoperative and suspect that Dr. Rich Fuchs team can be consulted in the morning if needed. Suspect drop in Hb from hematoma from falls?.  This chart was dictated using voice recognition software.  Despite best efforts to proofread,  errors can occur  which can change the documentation meaning.    Final Clinical Impression(s) / ED Diagnoses Final diagnoses:  Fall  Pain  Closed fracture of right pubis, unspecified portion of pubis, initial encounter ([place])  Other closed nondisplaced fracture of proximal end of right humerus, initial encounter  Symptomatic anemia  Hyperkalemia  Leg laceration, right, initial encounter    Rx / DC Orders ED Discharge Orders      None        Lennice Sites, DO 10/01/21 [place], [place], DO 10/01/21 1514

## 2021-10-01 NOTE — ED Notes (Signed)
Hold off on collected CBC that is ordered for 1515 until blood tranfusion complete.

## 2021-10-01 NOTE — ED Provider Notes (Signed)
Emergency Medicine Provider Triage Evaluation Note  [name] , a 87 y.o. female  was evaluated in triage.  Pt complains of right-sided pain.  Was seen yesterday after a fall.  Has a right humeral fracture.  Patient found to be hyperkalemic.  Was scheduled to follow-up with orthopedics on Monday and her primary care doctor on Tuesday but is unable to perform any of her ADLs secondary to pain.  No new injury.  Reports associated constipation.  Review of Systems  Positive:  Negative: See above  Physical Exam  BP (!) 127/31   Pulse 70   Temp 98.2 F (36.8 C)   Resp 16   SpO2 99%  Gen:   Awake, no distress   Resp:  Normal effort  MSK:   Moves extremities without difficulty  Other:  Raccoon eyes bilaterally.  There is moderate swelling and ecchymosis to the right forehead.  Medical Decision Making  Medically screening exam initiated at 7:16 AM.  Appropriate orders placed.  [name] was informed that the remainder of the evaluation will be completed by another provider, this initial triage assessment does not replace that evaluation, and the importance of remaining in the ED until their evaluation is complete.  Will repeat potassium.   Myna Bright [place], PA-C 10/01/21 0340    Davonna Belling, MD 10/01/21 [phone]

## 2021-10-01 NOTE — H&P (Signed)
History and Physical    [name] BHA:[id number] DOB: 11-May-1932 DOA: 10/01/2021  PCP: Lawerance Cruel, MD (Confirm with patient/family/NH records and if not entered, this has to be entered at [facility] point of entry) Patient coming from: Home  I have personally briefly reviewed patient's old medical records in [place]  Chief Complaint: Feeling pain, feeling sick.  HPI: [name] is a 87 y.o. female with medical history significant of PAF on Eliquis started recently, nonischemic cardiomyopathy status post CRT and on Lasix, mitral stenosis started post repair, SSS status post PPM, HTN, CKD stage III, chronic iron deficiency anemia, polymyalgia rheumatica on chronic steroid, chronic back pain on narcotics, presented with question of syncope/near syncope and fall.  Patient complains about right arm pain, right hip pain, right-sided facial pain and right flank pain.  Patient lives by herself, no issue of ambulation as per herself and daughter at bedside.  Yesterday, patient was doing some home decoration and fell down on right side.  She remembered she might have turned too fast toward the right side, but she cannot tell me whether there was any prodromes of lightheaded palpitations or blurry vision.  And she cannot remember whether she lost her consciousness.  But obviously she hit her head on the right side and developed a large hematoma.  And she also developed right-sided leg/flank/arm pain after the fall.  She was able to call the daughter, daughter came to her house and found she was on the floor crawling.  And daughter brought her to the hospital.  Last night, at ED, multiple scan was done which showed transverse fracture of proximal femoral neck and fracture greater tuberosity.  Patient was scheduled to see orthopedic surgery this coming Tuesday for the right arm fracture.  Patient was reassured and sent home.  However overnight, patient felt the pain from the right arm fracture  right hip pain not controlled, daughter went to her home this morning, informed her in the recliner, unable to move because of the pain and the feeling nausea, family contacted PCP who recommended patient come back to ED.  Patient was diagnosed with A. fib last year has been on Eliquis.  Her cardiologist recently increased her amlodipine due to uncontrolled hypertension.   ED Course: Rescanned today found patient had right pubic ramus as well as superior pubis fracture.  Hemoglobin 6.8 compared to 7.0 yesterday.  Creatinine 1.5 compared to 1.4 yesterday, K5.8.  Review of Systems: As per HPI otherwise 14 point review of systems negative.    Past Medical History:  Diagnosis Date   Allergic rhinitis    Anemia    iron deficient   Aneurysm (HCC)    Anxiety    Basal cell carcinoma    Chronic anemia    Chronic low back pain    Colon polyps    GERD (gastroesophageal reflux disease)    Hiatal hernia    HTN (hypertension)    Hypercholesteremia    Hyperlipidemia    Hypothyroidism    Insomnia    Left heart failure ([place])    Osteoporosis    S/P forteo treatment as of 02/2009   Pacemaker    Polymyalgia ([place])    rheumatica-steroids per Rheum   Systolic heart failure     Past Surgical History:  Procedure Laterality Date   ABDOMINAL HYSTERECTOMY     ANGIOPLASTY     BACK SURGERY     CARDIAC CATHETERIZATION  10/16/06   CATARACT EXTRACTION  defibulator implant     EP IMPLANTABLE DEVICE N/A 04/10/2016   Procedure: BiV Pacemaker Insertion CRT-P;  Surgeon: Evans Lance, MD;  Location: [facility];  Service: Cardiovascular;  Laterality: N/A;   FOOT SURGERY     MITRAL VALVE REPAIR     skin cancer removal     THYROIDECTOMY     TONSILLECTOMY       reports that she has never smoked. She has never used smokeless tobacco. She reports that she does not drink alcohol and does not use drugs.  Allergies  Allergen Reactions   Pertussis Vaccines Swelling   Sulfa Antibiotics Nausea  Only   Amoxicillin Rash and Other (See Comments)    Has patient had a PCN reaction causing immediate rash, facial/tongue/throat swelling, SOB or lightheadedness with hypotension: yes Has patient had a PCN reaction causing severe rash involving mucus membranes or skin necrosis: no Has patient had a PCN reaction that required hospitalization no Has patient had a PCN reaction occurring within the last 10 years: yes If all of the above answers are "NO", then may proceed with Cephalosporin use.     Family History  Problem Relation Age of Onset   Stroke Mother        in her 75's   Cancer Father 66       secondary   Colon cancer Brother    Colon cancer Sister    Breast cancer Sister      Prior to Admission medications   Medication Sig Start Date End Date Taking? Authorizing Provider  allopurinol (ZYLOPRIM) 100 MG tablet Take 100 mg by mouth daily.    [provider]  apixaban (ELIQUIS) 2.5 MG TABS tablet Take 1 tablet (2.5 mg total) by mouth 2 (two) times daily. 01/02/21   Evans Lance, MD  atorvastatin (LIPITOR) 40 MG tablet Take 40 mg by mouth daily. 04/06/15   [provider]  carvedilol (COREG) 25 MG tablet Take 1 tablet (25 mg total) by mouth 2 (two) times daily. 01/02/21   Evans Lance, MD  Cholecalciferol (VITAMIN D3) 1000 UNITS CAPS Take 1,000 Units by mouth daily.     [provider]  furosemide (LASIX) 40 MG tablet Take 1 tablet by mouth daily. 12/01/19   [provider]  HYDROcodone-acetaminophen (NORCO/VICODIN) 5-325 MG per tablet Take 1 tablet by mouth every 6 (six) hours as needed (For pain.).  03/30/15   [provider]  levothyroxine (SYNTHROID, LEVOTHROID) 75 MCG tablet Take 1 tablet (75 mcg total) by mouth daily. 11/03/14   Jettie Booze, MD  lisinopril (ZESTRIL) 20 MG tablet Take 1 tablet (20 mg total) by mouth daily. 01/20/21   Evans Lance, MD  methylPREDNISolone (MEDROL) 2 MG tablet Take 2 mg by mouth daily.      [provider]  omeprazole (PRILOSEC OTC) 20 MG tablet Take 20 mg by mouth 2 (two) times daily.    [provider]  potassium chloride SA (KLOR-CON) 20 MEQ tablet Take 1 tablet by mouth daily. 10/15/19   [provider]  sodium zirconium cyclosilicate (LOKELMA) 5 g packet Take 10 g by mouth 2 (two) times daily for 1 day. 09/30/21 10/01/21  Gareth Morgan, MD    Physical Exam: Vitals:   10/01/21 1000 10/01/21 1130 10/01/21 1200 10/01/21 1330  BP: (!) 115/32 (!) 92/54 (!) 134/29 (!) 136/37  Pulse: 74 66  71  Resp: 14 (!) 22 16 14   Temp:      SpO2:  90% 100%  100%    Constitutional: NAD, calm, comfortable Vitals:   10/01/21 1000 10/01/21 1130 10/01/21 1200 10/01/21 1330  BP: (!) 115/32 (!) 92/54 (!) 134/29 (!) 136/37  Pulse: 74 66  71  Resp: 14 (!) 22 16 14   Temp:      SpO2: 90% 100%  100%   Eyes: PERRL, lids and conjunctivae normal ENMT: Mucous membranes are moist. Posterior pharynx clear of any exudate or lesions.Normal dentition.  Neck: normal, supple, no masses, no thyromegaly Respiratory: clear to auscultation bilaterally, no wheezing, no crackles. Normal respiratory effort. No accessory muscle use.  Cardiovascular: Regular rate and rhythm, no murmurs / rubs / gallops. 2+ extremity edema. 2+ pedal pulses. No carotid bruits.  Abdomen: no tenderness, no masses palpated. No hepatosplenomegaly. Bowel sounds positive.  Musculoskeletal: no clubbing / cyanosis. No joint deformity upper and lower extremities. Good ROM, no contractures. Normal muscle tone.  Skin: Multiple bruises on right forehead, right face, right upper arm, right flank and abdominal wall.  Stapled laceration wound on right calf Neurologic: CN 2-12 grossly intact. Sensation intact, DTR normal. Strength 5/5 in all 4.  Psychiatric: Normal judgment and insight. Alert and oriented x 3. Normal mood.     Labs on Admission: I have personally reviewed following labs and imaging  studies  CBC: Recent Labs  Lab 09/30/21 1554 10/01/21 0719 10/01/21 1206  WBC 10.7* 10.5 12.5*  NEUTROABS 8.8* 6.2  --   HGB 9.4* 7.0* 6.8*  HCT 29.4* 22.3* 22.0*  MCV 103.2* 105.2* 106.3*  PLT 207 163 449*   Basic Metabolic Panel: Recent Labs  Lab 09/30/21 1554 10/01/21 0719  NA 140 138  K 5.5* 5.8*  CL 112* 112*  CO2 22 20*  GLUCOSE 123* 86  BUN 60* 56*  CREATININE 1.42* 1.50*  CALCIUM 8.8* 7.6*   GFR: Estimated Creatinine Clearance: 16.4 mL/min (A) (by C-G formula based on SCr of 1.5 mg/dL (H)). Liver Function Tests: Recent Labs  Lab 09/30/21 1554 10/01/21 0719  AST 35 34  ALT 34 26  ALKPHOS 53 37*  BILITOT 0.6 0.3  PROT 6.1* 4.9*  ALBUMIN 3.5 2.9*   No results for input(s): LIPASE, AMYLASE in the last 168 hours. No results for input(s): AMMONIA in the last 168 hours. Coagulation Profile: Recent Labs  Lab 09/30/21 1554  INR 1.2   Cardiac Enzymes: Recent Labs  Lab 09/30/21 1554  CKTOTAL 146   BNP (last 3 results) No results for input(s): PROBNP in the last 8760 hours. HbA1C: No results for input(s): HGBA1C in the last 72 hours. CBG: No results for input(s): GLUCAP in the last 168 hours. Lipid Profile: No results for input(s): CHOL, HDL, LDLCALC, TRIG, CHOLHDL, LDLDIRECT in the last 72 hours. Thyroid Function Tests: No results for input(s): TSH, T4TOTAL, FREET4, T3FREE, THYROIDAB in the last 72 hours. Anemia Panel: No results for input(s): VITAMINB12, FOLATE, FERRITIN, TIBC, IRON, RETICCTPCT in the last 72 hours. Urine analysis:    Component Value Date/Time   COLORURINE STRAW (A) 04/21/2019 0627   APPEARANCEUR HAZY (A) 04/21/2019 0627   LABSPEC <1.005 (L) 04/21/2019 0627   PHURINE 6.0 04/21/2019 0627   GLUCOSEU NEGATIVE 04/21/2019 0627   HGBUR TRACE (A) 04/21/2019 0627   BILIRUBINUR NEGATIVE 04/21/2019 0627   [place] 04/21/2019 0627   PROTEINUR NEGATIVE 04/21/2019 0627   UROBILINOGEN 0.2 10/18/2010 1650   NITRITE NEGATIVE  04/21/2019 0627   LEUKOCYTESUR LARGE (A) 04/21/2019 0627    Radiological Exams on Admission: DG Elbow Complete Right  Result Date: 09/30/2021 CLINICAL DATA:  Fall EXAM: RIGHT ELBOW - COMPLETE 3+ VIEW COMPARISON:  None. FINDINGS: There is no evidence of fracture, dislocation, or joint effusion. There is no evidence of arthropathy or other focal bone abnormality. Soft tissues are unremarkable. IMPRESSION: Negative. Electronically Signed   By: Franchot Gallo M.D.   On: 09/30/2021 17:20   CT HEAD WO CONTRAST (5MM)  Result Date: 10/01/2021 CLINICAL DATA:  Poly trauma.  Post fall. EXAM: CT HEAD WITHOUT CONTRAST CT CERVICAL SPINE WITHOUT CONTRAST TECHNIQUE: Multidetector CT imaging of the head and cervical spine was performed following the standard protocol without intravenous contrast. Multiplanar CT image reconstructions of the cervical spine were also generated. COMPARISON:  None. FINDINGS: CT HEAD FINDINGS Brain: No evidence of acute infarction, hemorrhage, hydrocephalus, extra-axial collection or mass lesion/mass effect. Moderate brain parenchymal volume and microangiopathy. Vascular: No hyperdense vessel or unexpected calcification. Skull: Normal. Negative for fracture or focal lesion. Sinuses/Orbits: No acute finding. Other: Right frontal scalp hematoma. CT CERVICAL SPINE FINDINGS Alignment: 1-2 mm the anterolisthesis of C4 on C5 and 1-2 mm posterior listhesis of C5 on C6, likely due to degenerative facet arthropathy. Skull base and vertebrae: No acute fracture. No primary bone lesion or focal pathologic process. Soft tissues and spinal canal: No prevertebral fluid or swelling. No visible canal hematoma. Disc levels:  Multilevel osteoarthritic changes. Upper chest: Negative. Other: None. IMPRESSION: 1. No acute intracranial abnormality. 2. Moderate brain parenchymal atrophy and microangiopathy. 3. No evidence of acute traumatic injury to cervical spine. 4. Multilevel osteoarthritic changes of the cervical  spine. 5. 1-2 mm anterolisthesis of C4 on C5 and 1-2 mm posterior listhesis of C5 on C6, likely due to degenerative facet arthropathy. Electronically Signed   By: Fidela Salisbury M.D.   On: 10/01/2021 14:44   CT HEAD WO CONTRAST  Result Date: 09/30/2021 CLINICAL DATA:  Facial trauma with right orbital bruising. EXAM: CT HEAD WITHOUT CONTRAST CT MAXILLOFACIAL WITHOUT CONTRAST CT CERVICAL SPINE WITHOUT CONTRAST TECHNIQUE: Multidetector CT imaging of the head, cervical spine, and maxillofacial structures were performed using the standard protocol without intravenous contrast. Multiplanar CT image reconstructions of the cervical spine and maxillofacial structures were also generated. COMPARISON:  CT scan of the brain June 14, 2015 FINDINGS: CT HEAD FINDINGS Brain: No evidence of acute infarction, hemorrhage, hydrocephalus, extra-axial collection or mass lesion/mass effect. Vascular: Calcified atherosclerosis in the intracranial carotids. Skull: Normal. Negative for fracture or focal lesion. Other: Hematoma and soft tissue swelling in the right supraorbital region. Air in the soft tissues suggests laceration. CT MAXILLOFACIAL FINDINGS Osseous: No fracture or mandibular dislocation. No destructive process. Orbits: Negative. No traumatic or inflammatory finding. Sinuses: Clear. Soft tissues: Soft tissue swelling and hematoma in the right Peri orbital and supraorbital region. Air in the soft tissues consistent with laceration. The underlying globe is in tact. No other soft tissue abnormalities are identified. CT CERVICAL SPINE FINDINGS Alignment: Trace retrolisthesis of C2 versus C3, not seen in 2016. Anterolisthesis of C4 versus C5, unchanged since 2016. Reversal of normal lordosis centered at C5-C6, unchanged since 2016. No other significant malalignment. Skull base and vertebrae: No acute fracture. No primary bone lesion or focal pathologic process. Soft tissues and spinal canal: No prevertebral soft tissue  swelling. No other soft tissue abnormalities. Disc levels: Multilevel degenerative disc disease. Facet degenerative changes. Upper chest: Scarring in the apices. No acute abnormalities in the upper lungs. Other: Fracture of the proximal right humerus seen on the scout view. No other abnormalities. IMPRESSION: 1. No  acute intracranial abnormalities. 2. Hematoma and soft tissue swelling in the right supraorbital region. No facial bone fractures. 3. Trace retrolisthesis of C2 versus C3, anterolisthesis of C4 versus C5, and reversal of normal lordosis at C5-6 is favored to be nonacute with no adjacent soft tissue swelling. The findings at C4-5 and C5-6 are unchanged since 2016. Recommend clinical correlation. 4. No fracture in the cervical spine. 5. Fracture of the proximal right humerus, better assessed on the dedicated right shoulder x-ray. Electronically Signed   By: Dorise Bullion III M.D.   On: 09/30/2021 17:22   CT Cervical Spine Wo Contrast  Result Date: 10/01/2021 CLINICAL DATA:  Poly trauma.  Post fall. EXAM: CT HEAD WITHOUT CONTRAST CT CERVICAL SPINE WITHOUT CONTRAST TECHNIQUE: Multidetector CT imaging of the head and cervical spine was performed following the standard protocol without intravenous contrast. Multiplanar CT image reconstructions of the cervical spine were also generated. COMPARISON:  None. FINDINGS: CT HEAD FINDINGS Brain: No evidence of acute infarction, hemorrhage, hydrocephalus, extra-axial collection or mass lesion/mass effect. Moderate brain parenchymal volume and microangiopathy. Vascular: No hyperdense vessel or unexpected calcification. Skull: Normal. Negative for fracture or focal lesion. Sinuses/Orbits: No acute finding. Other: Right frontal scalp hematoma. CT CERVICAL SPINE FINDINGS Alignment: 1-2 mm the anterolisthesis of C4 on C5 and 1-2 mm posterior listhesis of C5 on C6, likely due to degenerative facet arthropathy. Skull base and vertebrae: No acute fracture. No primary bone  lesion or focal pathologic process. Soft tissues and spinal canal: No prevertebral fluid or swelling. No visible canal hematoma. Disc levels:  Multilevel osteoarthritic changes. Upper chest: Negative. Other: None. IMPRESSION: 1. No acute intracranial abnormality. 2. Moderate brain parenchymal atrophy and microangiopathy. 3. No evidence of acute traumatic injury to cervical spine. 4. Multilevel osteoarthritic changes of the cervical spine. 5. 1-2 mm anterolisthesis of C4 on C5 and 1-2 mm posterior listhesis of C5 on C6, likely due to degenerative facet arthropathy. Electronically Signed   By: Fidela Salisbury M.D.   On: 10/01/2021 14:44   CT CERVICAL SPINE WO CONTRAST  Result Date: 09/30/2021 CLINICAL DATA:  Facial trauma with right orbital bruising. EXAM: CT HEAD WITHOUT CONTRAST CT MAXILLOFACIAL WITHOUT CONTRAST CT CERVICAL SPINE WITHOUT CONTRAST TECHNIQUE: Multidetector CT imaging of the head, cervical spine, and maxillofacial structures were performed using the standard protocol without intravenous contrast. Multiplanar CT image reconstructions of the cervical spine and maxillofacial structures were also generated. COMPARISON:  CT scan of the brain June 14, 2015 FINDINGS: CT HEAD FINDINGS Brain: No evidence of acute infarction, hemorrhage, hydrocephalus, extra-axial collection or mass lesion/mass effect. Vascular: Calcified atherosclerosis in the intracranial carotids. Skull: Normal. Negative for fracture or focal lesion. Other: Hematoma and soft tissue swelling in the right supraorbital region. Air in the soft tissues suggests laceration. CT MAXILLOFACIAL FINDINGS Osseous: No fracture or mandibular dislocation. No destructive process. Orbits: Negative. No traumatic or inflammatory finding. Sinuses: Clear. Soft tissues: Soft tissue swelling and hematoma in the right Peri orbital and supraorbital region. Air in the soft tissues consistent with laceration. The underlying globe is in tact. No other soft  tissue abnormalities are identified. CT CERVICAL SPINE FINDINGS Alignment: Trace retrolisthesis of C2 versus C3, not seen in 2016. Anterolisthesis of C4 versus C5, unchanged since 2016. Reversal of normal lordosis centered at C5-C6, unchanged since 2016. No other significant malalignment. Skull base and vertebrae: No acute fracture. No primary bone lesion or focal pathologic process. Soft tissues and spinal canal: No prevertebral soft tissue swelling. No other soft tissue abnormalities.  Disc levels: Multilevel degenerative disc disease. Facet degenerative changes. Upper chest: Scarring in the apices. No acute abnormalities in the upper lungs. Other: Fracture of the proximal right humerus seen on the scout view. No other abnormalities. IMPRESSION: 1. No acute intracranial abnormalities. 2. Hematoma and soft tissue swelling in the right supraorbital region. No facial bone fractures. 3. Trace retrolisthesis of C2 versus C3, anterolisthesis of C4 versus C5, and reversal of normal lordosis at C5-6 is favored to be nonacute with no adjacent soft tissue swelling. The findings at C4-5 and C5-6 are unchanged since 2016. Recommend clinical correlation. 4. No fracture in the cervical spine. 5. Fracture of the proximal right humerus, better assessed on the dedicated right shoulder x-ray. Electronically Signed   By: Dorise Bullion III M.D.   On: 09/30/2021 17:22   CT CHEST ABDOMEN PELVIS W CONTRAST  Result Date: 10/01/2021 CLINICAL DATA:  Fall down stairs. Blunt trauma. Right-sided chest and abdominal pain. Initial encounter. EXAM: CT CHEST, ABDOMEN, AND PELVIS WITH CONTRAST TECHNIQUE: Multidetector CT imaging of the chest, abdomen and pelvis was performed following the standard protocol during bolus administration of intravenous contrast. CONTRAST:  95mL OMNIPAQUE IOHEXOL 350 MG/ML SOLN COMPARISON:  None. FINDINGS: CT CHEST FINDINGS Cardiovascular: No evidence of thoracic aortic injury or mediastinal hematoma. No  pericardial effusion. Mild-to-moderate cardiomegaly. Pacemaker leads are seen in expected position, and prosthetic mitral valve also noted. Mediastinum/Nodes: No evidence of hemorrhage or pneumomediastinum. No masses or pathologically enlarged lymph nodes identified. Lungs/Pleura: No evidence of pulmonary contusion or other infiltrate. No evidence of pneumothorax or hemothorax. Musculoskeletal: No acute fractures or suspicious bone lesions identified. Old compression fractures and multiple vertebroplasties are noted in the lower thoracic spine. CT ABDOMEN PELVIS FINDINGS Hepatobiliary: No hepatic laceration or mass identified. Mild diffuse hepatic steatosis is noted. Gallbladder is unremarkable. No evidence of biliary ductal dilatation. Pancreas: No parenchymal laceration, mass, or inflammatory changes identified. Spleen: No evidence of splenic laceration. Adrenal/Urinary Tract: No hemorrhage or parenchymal lacerations identified. No evidence of mass or hydronephrosis. Unremarkable unopacified urinary bladder. Stomach/Bowel: Unopacified bowel loops are unremarkable in appearance. No evidence of hemoperitoneum. Vascular/Lymphatic: No evidence of abdominal aortic injury or retroperitoneal hemorrhage. Aortic atherosclerotic calcification noted. No pathologically enlarged lymph nodes identified. Reproductive: Prior hysterectomy noted. Adnexal regions are unremarkable in appearance. Other: A small right inguinal hernia is seen which contains a loop of small bowel. No evidence of bowel obstruction or strangulation. Musculoskeletal: A nondisplaced fracture is seen involving the right pubis and superior pubic ramus. Multiple previous lumbar vertebroplasties noted. IMPRESSION: Nondisplaced fracture of right pubis and superior pubic ramus. No evidence of internal organ injury. Small right inguinal hernia containing a loop of small bowel. No evidence of bowel obstruction or strangulation. Mild hepatic steatosis. Aortic  Atherosclerosis (ICD10-I70.0). Electronically Signed   By: Marlaine Hind M.D.   On: 10/01/2021 14:53   CT T-SPINE NO CHARGE  Result Date: 10/01/2021 CLINICAL DATA:  Pain.  Fall today. EXAM: CT THORACIC AND LUMBAR SPINE WITH CONTRAST TECHNIQUE: Multiplanar CT images of the thoracic and lumbar spine were reconstructed from contemporary CT of the Chest, Abdomen, and Pelvis CONTRAST:  No additional COMPARISON:  Chest radiographs 04/21/2019. Lumbar spine radiographs 04/08/2013. Lumbar spine MRI 03/05/2007. FINDINGS: CT THORACIC SPINE FINDINGS Alignment: Chronically exaggerated thoracic kyphosis. No significant listhesis in the thoracic spine. Vertebrae: Chronic, previously augmented T10, T11, and T12 compression fractures. Chronic, mild T4, mild-to-moderate T5, and moderate T8 and T9 compression fractures, all similar to the 2020 chest radiographs. Schmorl's nodes at T2,  T3, T6, and T7. No definite acute fracture or suspicious osseous lesion. Paraspinal and other soft tissues: No acute abnormality identified in the paraspinal soft tissues. Intrathoracic contents reported separately. Disc levels: Mild thoracic spondylosis without evidence of high-grade stenosis. CT LUMBAR SPINE FINDINGS Segmentation: 5 lumbar type vertebrae. Alignment: Normal. Vertebrae: Chronic, previously augmented L1 and L4 compression fractures. Preserved heights of the other lumbar vertebral bodies without an acute fracture or suspicious osseous lesion identified. Paraspinal and other soft tissues: No acute abnormality identified in the paraspinal soft tissues. Intra-abdominal and pelvic contents reported separately. Disc levels: Mild bilateral neural foraminal stenosis at L3-4 and L4-5 due to disc bulging and mild facet arthrosis. No high-grade spinal stenosis. IMPRESSION: 1. No evidence of acute osseous abnormality in the thoracic or lumbar spine. 2. Numerous chronic compression fractures. Electronically Signed   By: Logan Bores M.D.   On:  10/01/2021 14:28   CT L-SPINE NO CHARGE  Result Date: 10/01/2021 CLINICAL DATA:  Pain.  Fall today. EXAM: CT THORACIC AND LUMBAR SPINE WITH CONTRAST TECHNIQUE: Multiplanar CT images of the thoracic and lumbar spine were reconstructed from contemporary CT of the Chest, Abdomen, and Pelvis CONTRAST:  No additional COMPARISON:  Chest radiographs 04/21/2019. Lumbar spine radiographs 04/08/2013. Lumbar spine MRI 03/05/2007. FINDINGS: CT THORACIC SPINE FINDINGS Alignment: Chronically exaggerated thoracic kyphosis. No significant listhesis in the thoracic spine. Vertebrae: Chronic, previously augmented T10, T11, and T12 compression fractures. Chronic, mild T4, mild-to-moderate T5, and moderate T8 and T9 compression fractures, all similar to the 2020 chest radiographs. Schmorl's nodes at T2, T3, T6, and T7. No definite acute fracture or suspicious osseous lesion. Paraspinal and other soft tissues: No acute abnormality identified in the paraspinal soft tissues. Intrathoracic contents reported separately. Disc levels: Mild thoracic spondylosis without evidence of high-grade stenosis. CT LUMBAR SPINE FINDINGS Segmentation: 5 lumbar type vertebrae. Alignment: Normal. Vertebrae: Chronic, previously augmented L1 and L4 compression fractures. Preserved heights of the other lumbar vertebral bodies without an acute fracture or suspicious osseous lesion identified. Paraspinal and other soft tissues: No acute abnormality identified in the paraspinal soft tissues. Intra-abdominal and pelvic contents reported separately. Disc levels: Mild bilateral neural foraminal stenosis at L3-4 and L4-5 due to disc bulging and mild facet arthrosis. No high-grade spinal stenosis. IMPRESSION: 1. No evidence of acute osseous abnormality in the thoracic or lumbar spine. 2. Numerous chronic compression fractures. Electronically Signed   By: Logan Bores M.D.   On: 10/01/2021 14:28   DG Chest Portable 1 View  Result Date: 10/01/2021 CLINICAL DATA:   Fall.  Nausea.  Oliguria.  Bruising.  Pain. EXAM: PORTABLE CHEST 1 VIEW COMPARISON:  Chest radiograph 04/21/2019 FINDINGS: AICD noted. Mitral valve prosthesis. The patient is rotated to the left on today's radiograph, reducing diagnostic sensitivity and specificity. Atherosclerotic calcification of the aortic arch. Lower thoracic vertebral augmentations. Bony demineralization. The visualized lungs appear clear, part of the left lung base is obscured by the pulse generator. Mild enlargement of the cardiopericardial silhouette. Discontinuity in the proximal right humerus compatible with fracture. IMPRESSION: 1. No acute findings. 2. Mild enlargement of the cardiopericardial silhouette. 3. AICD and mitral valve prosthesis noted. 4. Fracture of the right proximal humerus partially included on today's exam. Electronically Signed   By: Van Clines M.D.   On: 10/01/2021 12:35   DG Shoulder Right Port  Result Date: 09/30/2021 CLINICAL DATA:  Fall EXAM: PORTABLE RIGHT SHOULDER COMPARISON:  None. FINDINGS: Transverse fracture of the humeral neck. Fracture of the greater tuberosity. No dislocation. Multiple  thoracic fractures with vertebroplasty cement. IMPRESSION: Fracture humeral neck and greater tuberosity Electronically Signed   By: Franchot Gallo M.D.   On: 09/30/2021 17:21   DG Tibia/Fibula Right Port  Result Date: 10/01/2021 CLINICAL DATA:  Fall yesterday. Worsening pain. Laceration along the anterior shin. EXAM: PORTABLE RIGHT TIBIA AND FIBULA - 2 VIEW COMPARISON:  Knee radiographs from 08/12/2015 FINDINGS: Vascular calcifications noted. Diffuse subcutaneous edema. No tibial or fibular fracture is identified. Lucency along the soft tissues the anterior shin probably reflecting laceration. IMPRESSION: 1. Laceration along the anterior shin, without underlying fracture. 2. Vascular calcifications. 3. Subcutaneous edema diffusely along the calf. Electronically Signed   By: Van Clines M.D.   On:  10/01/2021 12:37   DG Humerus Right  Result Date: 09/30/2021 CLINICAL DATA:  Fall EXAM: RIGHT HUMERUS - 2+ VIEW COMPARISON:  None. FINDINGS: Fracture right humeral neck with mild displacement. Fracture extends into the greater tuberosity of the humerus. Normal alignment of the humeral head. IMPRESSION: Transverse fracture proximal humeral neck. Fracture greater tuberosity. Electronically Signed   By: Franchot Gallo M.D.   On: 09/30/2021 17:18   DG Hip Unilat W or Wo Pelvis 2-3 Views Right  Result Date: 09/30/2021 CLINICAL DATA:  Fall EXAM: DG HIP (WITH OR WITHOUT PELVIS) 2-3V RIGHT COMPARISON:  None. FINDINGS: There is no evidence of hip fracture or dislocation. There is no evidence of arthropathy or other focal bone abnormality. Arterial calcification. Prior fracture of L4 with vertebroplasty cement. IMPRESSION: Negative. Electronically Signed   By: Franchot Gallo M.D.   On: 09/30/2021 17:19   CT MAXILLOFACIAL WO CONTRAST  Result Date: 09/30/2021 CLINICAL DATA:  Facial trauma with right orbital bruising. EXAM: CT HEAD WITHOUT CONTRAST CT MAXILLOFACIAL WITHOUT CONTRAST CT CERVICAL SPINE WITHOUT CONTRAST TECHNIQUE: Multidetector CT imaging of the head, cervical spine, and maxillofacial structures were performed using the standard protocol without intravenous contrast. Multiplanar CT image reconstructions of the cervical spine and maxillofacial structures were also generated. COMPARISON:  CT scan of the brain June 14, 2015 FINDINGS: CT HEAD FINDINGS Brain: No evidence of acute infarction, hemorrhage, hydrocephalus, extra-axial collection or mass lesion/mass effect. Vascular: Calcified atherosclerosis in the intracranial carotids. Skull: Normal. Negative for fracture or focal lesion. Other: Hematoma and soft tissue swelling in the right supraorbital region. Air in the soft tissues suggests laceration. CT MAXILLOFACIAL FINDINGS Osseous: No fracture or mandibular dislocation. No destructive process. Orbits:  Negative. No traumatic or inflammatory finding. Sinuses: Clear. Soft tissues: Soft tissue swelling and hematoma in the right Peri orbital and supraorbital region. Air in the soft tissues consistent with laceration. The underlying globe is in tact. No other soft tissue abnormalities are identified. CT CERVICAL SPINE FINDINGS Alignment: Trace retrolisthesis of C2 versus C3, not seen in 2016. Anterolisthesis of C4 versus C5, unchanged since 2016. Reversal of normal lordosis centered at C5-C6, unchanged since 2016. No other significant malalignment. Skull base and vertebrae: No acute fracture. No primary bone lesion or focal pathologic process. Soft tissues and spinal canal: No prevertebral soft tissue swelling. No other soft tissue abnormalities. Disc levels: Multilevel degenerative disc disease. Facet degenerative changes. Upper chest: Scarring in the apices. No acute abnormalities in the upper lungs. Other: Fracture of the proximal right humerus seen on the scout view. No other abnormalities. IMPRESSION: 1. No acute intracranial abnormalities. 2. Hematoma and soft tissue swelling in the right supraorbital region. No facial bone fractures. 3. Trace retrolisthesis of C2 versus C3, anterolisthesis of C4 versus C5, and reversal of normal lordosis at C5-6  is favored to be nonacute with no adjacent soft tissue swelling. The findings at C4-5 and C5-6 are unchanged since 2016. Recommend clinical correlation. 4. No fracture in the cervical spine. 5. Fracture of the proximal right humerus, better assessed on the dedicated right shoulder x-ray. Electronically Signed   By: Dorise Bullion III M.D.   On: 09/30/2021 17:22    EKG: Independently reviewed.  Sinus rhythm, no acute PR or QTc interval changes.  Assessment/Plan Active Problems:   Fall at home, initial encounter   Fall  (please populate well all problems here in Problem List. (For example, if patient is on BP meds at home and you resume or decide to hold them, it  is a problem that needs to be her. Same for CAD, COPD, HLD and so on)  Acute blood loss anemia -Likely secondary to multiple hematomas mainly on the right side of her body including the right forehead, right arm/fracture, right flank and right leg. -PRBC x1, repeat H&H tomorrow. -Stool color is brown and negative Guaiac test in ED. -Hold Eliquis for now. -Chemical DVT prophylaxis if H&H is stable, starting tomorrow.  Fall, question of syncope or near syncope -PPM interrogation, communicated with nurse -Telemetry monitoring for 24 hours -Echocardiogram -Orthostatic vital signs when able to.  Right humeral neck fracture, right pubic ramus and right supra pubis fracture -D/W on call ortho Dr. Victorino December, both the right arm and right hip fractures are non-operatable and command right arm sling 3 weeks and right hip weightbearing as tolerated and physical therapy.  Acute on chronic diastolic CHF decompensation -Clinically has signs of fluid overload, change p.o. Lasix to IV Lasix 40 mg daily starting tomorrow. -Hold amlodipine for worsening of peripheral edema.  AKI on CKD stage IIIb -Fluid overload, suspect cardiorenal syndrome. -Blood pressure on the low side, hold off ACEI. -Lasix as above -As needed hydralazine for blood pressure control.  Hyperkalemia -Discontinue potassium supplement -Received 1 dose of Lokelma, no EKG ST changes. -Repeat K level tonight.  HTN -BP borderline low, hold off amlodipine, hold off ACEI, hold off Coreg. -As needed hydralazine for now.  Right calf skin laceration -Probably related to the fall although family denied patient had a skin laceration yesterday. -Stable in the ED, consult wound care.  PAF -Sinus rhythm, hold off Coreg for borderline low hypotension -Use as needed metoprolol for rate control -Hold Eliquis until H&H stabilized.  Hypothyroidism -Check TSH, resume home dosage of Synthroid.  Polymyalgia rheumatica -Double dose of  steroid for acute stress phase.  On PPI.  DVT prophylaxis: Heparin subcu Code Status: Full code Family Communication: Daughter at bedside Disposition Plan: Expect more than 2 midnight hospital stay, expect discharge to SNF. Consults called: Ortho. Admission status: Tele admit   Lequita Halt MD [facility] Pager [phone]  10/01/2021, 3:42 PM

## 2021-10-01 NOTE — ED Triage Notes (Signed)
Pt had a fall yesterday, was seen and discharged home. Pt reports worsening pain, nausea, and reports not urinating x 2 days. Pt has a fx to R arm, bruising noted face and arms

## 2021-10-02 ENCOUNTER — Inpatient Hospital Stay (HOSPITAL_COMMUNITY): Payer: Medicare Other

## 2021-10-02 DIAGNOSIS — S81811A Laceration without foreign body, right lower leg, initial encounter: Secondary | ICD-10-CM

## 2021-10-02 DIAGNOSIS — W19XXXS Unspecified fall, sequela: Secondary | ICD-10-CM

## 2021-10-02 DIAGNOSIS — I5033 Acute on chronic diastolic (congestive) heart failure: Secondary | ICD-10-CM

## 2021-10-02 LAB — BASIC METABOLIC PANEL
Anion gap: 11 (ref 5–15)
Anion gap: 7 (ref 5–15)
BUN: 51 mg/dL — ABNORMAL HIGH (ref 8–23)
BUN: 51 mg/dL — ABNORMAL HIGH (ref 8–23)
CO2: 15 mmol/L — ABNORMAL LOW (ref 22–32)
CO2: 18 mmol/L — ABNORMAL LOW (ref 22–32)
Calcium: 7.1 mg/dL — ABNORMAL LOW (ref 8.9–10.3)
Calcium: 7 mg/dL — ABNORMAL LOW (ref 8.9–10.3)
Chloride: 113 mmol/L — ABNORMAL HIGH (ref 98–111)
Chloride: 113 mmol/L — ABNORMAL HIGH (ref 98–111)
Creatinine, Ser: 1.69 mg/dL — ABNORMAL HIGH (ref 0.44–1.00)
Creatinine, Ser: 1.71 mg/dL — ABNORMAL HIGH (ref 0.44–1.00)
GFR, Estimated: 29 mL/min — ABNORMAL LOW (ref >60)
GFR, Estimated: 29 mL/min — ABNORMAL LOW (ref >60)
Glucose, Bld: 42 mg/dL — CL (ref 70–99)
Glucose, Bld: 117 mg/dL — ABNORMAL HIGH (ref 70–99)
Potassium: 4.5 mmol/L (ref 3.5–5.1)
Potassium: 4.8 mmol/L (ref 3.5–5.1)
Sodium: 139 mmol/L (ref 135–145)
Sodium: 138 mmol/L (ref 135–145)

## 2021-10-02 LAB — CBC
HCT: 25.6 % — ABNORMAL LOW (ref 36.0–46.0)
Hemoglobin: 8.5 g/dL — ABNORMAL LOW (ref 12.0–15.0)
MCH: 32.4 pg (ref 26.0–34.0)
MCHC: 33.2 g/dL (ref 30.0–36.0)
MCV: 97.7 fL (ref 80.0–100.0)
Platelets: 128 10*3/uL — ABNORMAL LOW (ref 150–400)
RBC: 2.62 MIL/uL — ABNORMAL LOW (ref 3.87–5.11)
RDW: 17.3 % — ABNORMAL HIGH (ref 11.5–15.5)
WBC: 20.3 10*3/uL — ABNORMAL HIGH (ref 4.0–10.5)
nRBC: 0.2 % (ref 0.0–0.2)

## 2021-10-02 LAB — ECHOCARDIOGRAM COMPLETE
Area-P 1/2: 4.29 cm2
MV VTI: 1.73 cm2
S' Lateral: 3 cm

## 2021-10-02 LAB — TYPE AND SCREEN
ABO/RH(D): O POS
Antibody Screen: NEGATIVE
Unit division: 0

## 2021-10-02 LAB — CBG MONITORING, ED
Glucose-Capillary: 22 mg/dL — CL (ref 70–99)
Glucose-Capillary: 125 mg/dL — ABNORMAL HIGH (ref 70–99)
Glucose-Capillary: 85 mg/dL (ref 70–99)
Glucose-Capillary: 86 mg/dL (ref 70–99)
Glucose-Capillary: 95 mg/dL (ref 70–99)
Glucose-Capillary: 99 mg/dL (ref 70–99)
Glucose-Capillary: 119 mg/dL — ABNORMAL HIGH (ref 70–99)
Glucose-Capillary: 101 mg/dL — ABNORMAL HIGH (ref 70–99)
Glucose-Capillary: 102 mg/dL — ABNORMAL HIGH (ref 70–99)
Glucose-Capillary: 90 mg/dL (ref 70–99)
Glucose-Capillary: 100 mg/dL — ABNORMAL HIGH (ref 70–99)
Glucose-Capillary: 90 mg/dL (ref 70–99)
Glucose-Capillary: 95 mg/dL (ref 70–99)

## 2021-10-02 LAB — BPAM RBC
Blood Product Expiration Date: 202212032359
ISSUE DATE / TIME: 202211061548
Unit Type and Rh: 5100

## 2021-10-02 LAB — GLUCOSE, CAPILLARY: Glucose-Capillary: 138 mg/dL — ABNORMAL HIGH (ref 70–99)

## 2021-10-02 LAB — PROCALCITONIN: Procalcitonin: 8.68 ng/mL

## 2021-10-02 MED ORDER — PERFLUTREN LIPID MICROSPHERE
1.0000 mL | INTRAVENOUS | Status: AC | PRN
  Administered 2021-10-02: 2 mL via INTRAVENOUS
  Filled 2021-10-02: qty 10

## 2021-10-02 MED ORDER — DEXTROSE 50 % IV SOLN: 1.0000 | INTRAVENOUS | Status: DC | PRN

## 2021-10-02 MED ORDER — DEXTROSE 50 % IV SOLN
50.0000 mL | Freq: Once | INTRAVENOUS | Status: AC
  Administered 2021-10-02: 50 mL via INTRAVENOUS
  Filled 2021-10-02: qty 50

## 2021-10-02 MED ORDER — ENSURE ENLIVE PO LIQD
237.0000 mL | Freq: Two times a day (BID) | ORAL | Status: DC
Start: 2021-10-02 — End: 2021-10-03
  Administered 2021-10-02 – 2021-10-03 (×2): 237 mL via ORAL
  Filled 2021-10-02: qty 237

## 2021-10-02 NOTE — Progress Notes (Addendum)
PROGRESS NOTE    [name]  ZDG:[id number] DOB: 17-Mar-1932 DOA: 10/01/2021 PCP: Lawerance Cruel, MD   Chief Complaint  Patient presents with   Pain   Fall    Brief Narrative:   HPI: [name] is a 87 y.o. female with medical history significant of PAF on Eliquis started recently, nonischemic cardiomyopathy status post CRT and on Lasix, mitral stenosis started post repair, SSS status post PPM, HTN, CKD stage III, chronic iron deficiency anemia, polymyalgia rheumatica on chronic steroid, chronic back pain on narcotics, presented with question of syncope/near syncope and fall.   Patient complains about right arm pain, right hip pain, right-sided facial pain and right flank pain.  Patient lives by herself, no issue of ambulation as per herself and daughter at bedside.  Yesterday, patient was doing some home decoration and fell down on right side.  She remembered she might have turned too fast toward the right side, but she cannot tell me whether there was any prodromes of lightheaded palpitations or blurry vision.  And she cannot remember whether she lost her consciousness.  But obviously she hit her head on the right side and developed a large hematoma.  And she also developed right-sided leg/flank/arm pain after the fall.  She was able to call the daughter, daughter came to her house and found she was on the floor crawling.  And daughter brought her to the hospital.   Last night, at ED, multiple scan was done which showed transverse fracture of proximal femoral neck and fracture greater tuberosity.  Patient was scheduled to see orthopedic surgery this coming Tuesday for the right arm fracture.  Patient was reassured and sent home.   However overnight, patient felt the pain from the right arm fracture right hip pain not controlled, daughter went to her home this morning, informed her in the recliner, unable to move because of the pain and the feeling nausea, family contacted PCP who  recommended patient come back to ED.   Patient was diagnosed with A. fib last year has been on Eliquis.  Her cardiologist recently increased her amlodipine due to uncontrolled hypertension.     ED Course: Rescanned today found patient had right pubic ramus as well as superior pubis fracture.  Hemoglobin 6.8 compared to 7.0 yesterday.  Creatinine 1.5 compared to 1.4 yesterday, K5.8. Assessment & Plan:   Active Problems:   Fall at home, initial encounter   Fall    Acute blood loss anemia -likely secondary to multiple hematomas mainly on the right side of her body including the right forehead, right arm/fracture, pelvic fracture and right leg wound.  . -Transfused 1 unit PRBC, continue to monitor closely and transfuse as needed . -Stool color is brown and negative Guaiac test in ED. -Continue to hold Eliquis for now -Consider chemical DVT prophylaxis in 24 hours if CBC remains stable   Fall -Cannot recall exact events, but unlikely syncope or presyncope as she thinks she tripped on her rug when it happened . -PPM interrogation, -Telemetry monitoring -2D echo was done, no significant valvular disease, preserved EF   Right humeral neck fracture, right pubic ramus and right supra pubis fracture -Admitting physician D/W on call ortho Dr. Victorino December, both the right arm and right hip fractures are non-operatable and command right arm sling 3 weeks and right hip weightbearing as tolerated and physical therapy.   Acute on chronic diastolic CHF decompensation -Clinically has signs of fluid overload, change p.o. Lasix to IV Lasix  40 mg daily starting tomorrow. -Hold amlodipine for worsening of peripheral edema.   AKI on CKD stage IIIb -Fluid overload, suspect cardiorenal syndrome. -Blood pressure on the low side, hold off ACEI. -Lasix as needed -As needed hydralazine for blood pressure control.   Hyperkalemia -Discontinue potassium supplement -Received 1 dose of Lokelma, no EKG ST  changes. -Has normalized   HTN -BP borderline low, hold off amlodipine, hold off ACEI, hold off Coreg. -As needed hydralazine for now.   Right calf skin laceration -Probably related to the fall although family denied patient had a skin laceration yesterday. -Stable in the ED, consult wound care. -It was stapled, see pictures below   PAF -Sinus rhythm, hold off Coreg for borderline low hypotension -Use as needed metoprolol for rate control -Hold Eliquis for now.   Hypothyroidism -resume home dosage of Synthroid.   Polymyalgia rheumatica -Double dose of steroid for acute stress phase.  On PPI.  Leukocytosis -Most likely stress related from her fractures and increased steroids, will check procalcitonin.  Hypoglycemia -No recurrence when she is resumed on a diet.  DVT prophylaxis: SCD's Code Status: Full Family Communication: none at bedside, discussed with daughter [name] by phone Disposition:   Status is: Inpatient        Consultants:  Admitting physician discussed with orthopedic Dr. Stann Mainland   Subjective: Complaining of generalized weakness, poor appetite, fatigue, and generalized ache.  Objective: Vitals:   10/02/21 1330 10/02/21 1400 10/02/21 1430 10/02/21 1500  BP: (!) 126/39 (!) 130/41 (!) 105/31 (!) 108/31  Pulse: 88 86 86 83  Resp: (!) 22 (!) 23 20 (!) 36  Temp:      TempSrc:      SpO2: 100% 100% 100% 100%    Intake/Output Summary (Last 24 hours) at 10/02/2021 1521 Last data filed at 10/01/2021 2046 Gross per 24 hour  Intake 315 ml  Output --  Net 315 ml   There were no vitals filed for this visit.  Examination:  Awake Alert, Oriented X 3, frail, multiple bruising  new F.N deficits, Normal affect Symmetrical Chest wall movement, Good air movement bilaterally, CTAB RRR,No Gallops,Rubs or new Murmurs, No Parasternal Heave +ve B.Sounds, Abd Soft, No tenderness, No rebound - guarding or rigidity. No Cyanosis, Clubbing or edema, right lower  extremity skin lacerations.              Data Reviewed: I have personally reviewed following labs and imaging studies  CBC: Recent Labs  Lab 09/30/21 1554 10/01/21 0719 10/01/21 1206 10/02/21 0926  WBC 10.7* 10.5 12.5* 20.3*  NEUTROABS 8.8* 6.2  --   --   HGB 9.4* 7.0* 6.8* 8.5*  HCT 29.4* 22.3* 22.0* 25.6*  MCV 103.2* 105.2* 106.3* 97.7  PLT 207 163 131* 128*    Basic Metabolic Panel: Recent Labs  Lab 09/30/21 1554 10/01/21 0719 10/02/21 0249 10/02/21 0926  NA 140 138 139 138  K 5.5* 5.8* 4.5 4.8  CL 112* 112* 113* 113*  CO2 22 20* 15* 18*  GLUCOSE 123* 86 42* 117*  BUN 60* 56* 51* 51*  CREATININE 1.42* 1.50* 1.69* 1.71*  CALCIUM 8.8* 7.6* 7.1* 7.0*    GFR: Estimated Creatinine Clearance: 14.4 mL/min (A) (by C-G formula based on SCr of 1.71 mg/dL (H)).  Liver Function Tests: Recent Labs  Lab 09/30/21 1554 10/01/21 0719  AST 35 34  ALT 34 26  ALKPHOS 53 37*  BILITOT 0.6 0.3  PROT 6.1* 4.9*  ALBUMIN 3.5 2.9*    CBG: Recent Labs  Lab 10/02/21 0657 10/02/21 0807 10/02/21 0913 10/02/21 1052 10/02/21 1343  GLUCAP 119* 101* 102* 90 100*     Recent Results (from the past 240 hour(s))  Resp Panel by RT-PCR (Flu A&B, Covid) Nasopharyngeal Swab     Status: None   Collection Time: 09/30/21  3:39 PM   Specimen: Nasopharyngeal Swab; Nasopharyngeal(NP) swabs in vial transport medium  Result Value Ref Range Status   SARS Coronavirus 2 by RT PCR NEGATIVE NEGATIVE Final    Comment: (NOTE) SARS-CoV-2 target nucleic acids are NOT DETECTED.  The SARS-CoV-2 RNA is generally detectable in upper respiratory specimens during the acute phase of infection. The lowest concentration of SARS-CoV-2 viral copies this assay can detect is 138 copies/mL. A negative result does not preclude SARS-Cov-2 infection and should not be used as the sole basis for treatment or other patient management decisions. A negative result may occur with  improper specimen  collection/handling, submission of specimen other than nasopharyngeal swab, presence of viral mutation(s) within the areas targeted by this assay, and inadequate number of viral copies(<138 copies/mL). A negative result must be combined with clinical observations, patient history, and epidemiological information. The expected result is Negative.  Fact Sheet for Patients:  EntrepreneurPulse.com.au  Fact Sheet for Healthcare Providers:  IncredibleEmployment.be  This test is no t yet approved or cleared by the [place] FDA and  has been authorized for detection and/or diagnosis of SARS-CoV-2 by FDA under an Emergency Use Authorization (EUA). This EUA will remain  in effect (meaning this test can be used) for the duration of the COVID-19 declaration under Section 564(b)(1) of the Act, 21 U.S.C.section 360bbb-3(b)(1), unless the authorization is terminated  or revoked sooner.       Influenza A by PCR NEGATIVE NEGATIVE Final   Influenza B by PCR NEGATIVE NEGATIVE Final    Comment: (NOTE) The Xpert Xpress SARS-CoV-2/FLU/RSV plus assay is intended as an aid in the diagnosis of influenza from Nasopharyngeal swab specimens and should not be used as a sole basis for treatment. Nasal washings and aspirates are unacceptable for Xpert Xpress SARS-CoV-2/FLU/RSV testing.  Fact Sheet for Patients: EntrepreneurPulse.com.au  Fact Sheet for Healthcare Providers: IncredibleEmployment.be  This test is not yet approved or cleared by the [place] FDA and has been authorized for detection and/or diagnosis of SARS-CoV-2 by FDA under an Emergency Use Authorization (EUA). This EUA will remain in effect (meaning this test can be used) for the duration of the COVID-19 declaration under Section 564(b)(1) of the Act, 21 U.S.C. section 360bbb-3(b)(1), unless the authorization is terminated or revoked.  Performed at [facility], [place] [address], [place], [place] [address]          Radiology Studies: DG Elbow Complete Right  Result Date: 09/30/2021 CLINICAL DATA:  Fall EXAM: RIGHT ELBOW - COMPLETE 3+ VIEW COMPARISON:  None. FINDINGS: There is no evidence of fracture, dislocation, or joint effusion. There is no evidence of arthropathy or other focal bone abnormality. Soft tissues are unremarkable. IMPRESSION: Negative. Electronically Signed   By: Franchot Gallo M.D.   On: 09/30/2021 17:20   CT HEAD WO CONTRAST (5MM)  Result Date: 10/01/2021 CLINICAL DATA:  Poly trauma.  Post fall. EXAM: CT HEAD WITHOUT CONTRAST CT CERVICAL SPINE WITHOUT CONTRAST TECHNIQUE: Multidetector CT imaging of the head and cervical spine was performed following the standard protocol without intravenous contrast. Multiplanar CT image reconstructions of the cervical spine were also generated. COMPARISON:  None. FINDINGS: CT HEAD FINDINGS Brain: No evidence of  acute infarction, hemorrhage, hydrocephalus, extra-axial collection or mass lesion/mass effect. Moderate brain parenchymal volume and microangiopathy. Vascular: No hyperdense vessel or unexpected calcification. Skull: Normal. Negative for fracture or focal lesion. Sinuses/Orbits: No acute finding. Other: Right frontal scalp hematoma. CT CERVICAL SPINE FINDINGS Alignment: 1-2 mm the anterolisthesis of C4 on C5 and 1-2 mm posterior listhesis of C5 on C6, likely due to degenerative facet arthropathy. Skull base and vertebrae: No acute fracture. No primary bone lesion or focal pathologic process. Soft tissues and spinal canal: No prevertebral fluid or swelling. No visible canal hematoma. Disc levels:  Multilevel osteoarthritic changes. Upper chest: Negative. Other: None. IMPRESSION: 1. No acute intracranial abnormality. 2. Moderate brain parenchymal atrophy and microangiopathy. 3. No evidence of acute traumatic injury to cervical spine. 4. Multilevel osteoarthritic changes of the cervical  spine. 5. 1-2 mm anterolisthesis of C4 on C5 and 1-2 mm posterior listhesis of C5 on C6, likely due to degenerative facet arthropathy. Electronically Signed   By: Fidela Salisbury M.D.   On: 10/01/2021 14:44   CT HEAD WO CONTRAST  Result Date: 09/30/2021 CLINICAL DATA:  Facial trauma with right orbital bruising. EXAM: CT HEAD WITHOUT CONTRAST CT MAXILLOFACIAL WITHOUT CONTRAST CT CERVICAL SPINE WITHOUT CONTRAST TECHNIQUE: Multidetector CT imaging of the head, cervical spine, and maxillofacial structures were performed using the standard protocol without intravenous contrast. Multiplanar CT image reconstructions of the cervical spine and maxillofacial structures were also generated. COMPARISON:  CT scan of the brain June 14, 2015 FINDINGS: CT HEAD FINDINGS Brain: No evidence of acute infarction, hemorrhage, hydrocephalus, extra-axial collection or mass lesion/mass effect. Vascular: Calcified atherosclerosis in the intracranial carotids. Skull: Normal. Negative for fracture or focal lesion. Other: Hematoma and soft tissue swelling in the right supraorbital region. Air in the soft tissues suggests laceration. CT MAXILLOFACIAL FINDINGS Osseous: No fracture or mandibular dislocation. No destructive process. Orbits: Negative. No traumatic or inflammatory finding. Sinuses: Clear. Soft tissues: Soft tissue swelling and hematoma in the right Peri orbital and supraorbital region. Air in the soft tissues consistent with laceration. The underlying globe is in tact. No other soft tissue abnormalities are identified. CT CERVICAL SPINE FINDINGS Alignment: Trace retrolisthesis of C2 versus C3, not seen in 2016. Anterolisthesis of C4 versus C5, unchanged since 2016. Reversal of normal lordosis centered at C5-C6, unchanged since 2016. No other significant malalignment. Skull base and vertebrae: No acute fracture. No primary bone lesion or focal pathologic process. Soft tissues and spinal canal: No prevertebral soft tissue  swelling. No other soft tissue abnormalities. Disc levels: Multilevel degenerative disc disease. Facet degenerative changes. Upper chest: Scarring in the apices. No acute abnormalities in the upper lungs. Other: Fracture of the proximal right humerus seen on the scout view. No other abnormalities. IMPRESSION: 1. No acute intracranial abnormalities. 2. Hematoma and soft tissue swelling in the right supraorbital region. No facial bone fractures. 3. Trace retrolisthesis of C2 versus C3, anterolisthesis of C4 versus C5, and reversal of normal lordosis at C5-6 is favored to be nonacute with no adjacent soft tissue swelling. The findings at C4-5 and C5-6 are unchanged since 2016. Recommend clinical correlation. 4. No fracture in the cervical spine. 5. Fracture of the proximal right humerus, better assessed on the dedicated right shoulder x-ray. Electronically Signed   By: Dorise Bullion III M.D.   On: 09/30/2021 17:22   CT Cervical Spine Wo Contrast  Result Date: 10/01/2021 CLINICAL DATA:  Poly trauma.  Post fall. EXAM: CT HEAD WITHOUT CONTRAST CT CERVICAL SPINE WITHOUT CONTRAST TECHNIQUE: Multidetector  CT imaging of the head and cervical spine was performed following the standard protocol without intravenous contrast. Multiplanar CT image reconstructions of the cervical spine were also generated. COMPARISON:  None. FINDINGS: CT HEAD FINDINGS Brain: No evidence of acute infarction, hemorrhage, hydrocephalus, extra-axial collection or mass lesion/mass effect. Moderate brain parenchymal volume and microangiopathy. Vascular: No hyperdense vessel or unexpected calcification. Skull: Normal. Negative for fracture or focal lesion. Sinuses/Orbits: No acute finding. Other: Right frontal scalp hematoma. CT CERVICAL SPINE FINDINGS Alignment: 1-2 mm the anterolisthesis of C4 on C5 and 1-2 mm posterior listhesis of C5 on C6, likely due to degenerative facet arthropathy. Skull base and vertebrae: No acute fracture. No primary bone  lesion or focal pathologic process. Soft tissues and spinal canal: No prevertebral fluid or swelling. No visible canal hematoma. Disc levels:  Multilevel osteoarthritic changes. Upper chest: Negative. Other: None. IMPRESSION: 1. No acute intracranial abnormality. 2. Moderate brain parenchymal atrophy and microangiopathy. 3. No evidence of acute traumatic injury to cervical spine. 4. Multilevel osteoarthritic changes of the cervical spine. 5. 1-2 mm anterolisthesis of C4 on C5 and 1-2 mm posterior listhesis of C5 on C6, likely due to degenerative facet arthropathy. Electronically Signed   By: Fidela Salisbury M.D.   On: 10/01/2021 14:44   CT CERVICAL SPINE WO CONTRAST  Result Date: 09/30/2021 CLINICAL DATA:  Facial trauma with right orbital bruising. EXAM: CT HEAD WITHOUT CONTRAST CT MAXILLOFACIAL WITHOUT CONTRAST CT CERVICAL SPINE WITHOUT CONTRAST TECHNIQUE: Multidetector CT imaging of the head, cervical spine, and maxillofacial structures were performed using the standard protocol without intravenous contrast. Multiplanar CT image reconstructions of the cervical spine and maxillofacial structures were also generated. COMPARISON:  CT scan of the brain June 14, 2015 FINDINGS: CT HEAD FINDINGS Brain: No evidence of acute infarction, hemorrhage, hydrocephalus, extra-axial collection or mass lesion/mass effect. Vascular: Calcified atherosclerosis in the intracranial carotids. Skull: Normal. Negative for fracture or focal lesion. Other: Hematoma and soft tissue swelling in the right supraorbital region. Air in the soft tissues suggests laceration. CT MAXILLOFACIAL FINDINGS Osseous: No fracture or mandibular dislocation. No destructive process. Orbits: Negative. No traumatic or inflammatory finding. Sinuses: Clear. Soft tissues: Soft tissue swelling and hematoma in the right Peri orbital and supraorbital region. Air in the soft tissues consistent with laceration. The underlying globe is in tact. No other soft  tissue abnormalities are identified. CT CERVICAL SPINE FINDINGS Alignment: Trace retrolisthesis of C2 versus C3, not seen in 2016. Anterolisthesis of C4 versus C5, unchanged since 2016. Reversal of normal lordosis centered at C5-C6, unchanged since 2016. No other significant malalignment. Skull base and vertebrae: No acute fracture. No primary bone lesion or focal pathologic process. Soft tissues and spinal canal: No prevertebral soft tissue swelling. No other soft tissue abnormalities. Disc levels: Multilevel degenerative disc disease. Facet degenerative changes. Upper chest: Scarring in the apices. No acute abnormalities in the upper lungs. Other: Fracture of the proximal right humerus seen on the scout view. No other abnormalities. IMPRESSION: 1. No acute intracranial abnormalities. 2. Hematoma and soft tissue swelling in the right supraorbital region. No facial bone fractures. 3. Trace retrolisthesis of C2 versus C3, anterolisthesis of C4 versus C5, and reversal of normal lordosis at C5-6 is favored to be nonacute with no adjacent soft tissue swelling. The findings at C4-5 and C5-6 are unchanged since 2016. Recommend clinical correlation. 4. No fracture in the cervical spine. 5. Fracture of the proximal right humerus, better assessed on the dedicated right shoulder x-ray. Electronically Signed   By: Shanon Brow  Jimmye Norman III M.D.   On: 09/30/2021 17:22   CT CHEST ABDOMEN PELVIS W CONTRAST  Result Date: 10/01/2021 CLINICAL DATA:  Fall down stairs. Blunt trauma. Right-sided chest and abdominal pain. Initial encounter. EXAM: CT CHEST, ABDOMEN, AND PELVIS WITH CONTRAST TECHNIQUE: Multidetector CT imaging of the chest, abdomen and pelvis was performed following the standard protocol during bolus administration of intravenous contrast. CONTRAST:  79mL OMNIPAQUE IOHEXOL 350 MG/ML SOLN COMPARISON:  None. FINDINGS: CT CHEST FINDINGS Cardiovascular: No evidence of thoracic aortic injury or mediastinal hematoma. No  pericardial effusion. Mild-to-moderate cardiomegaly. Pacemaker leads are seen in expected position, and prosthetic mitral valve also noted. Mediastinum/Nodes: No evidence of hemorrhage or pneumomediastinum. No masses or pathologically enlarged lymph nodes identified. Lungs/Pleura: No evidence of pulmonary contusion or other infiltrate. No evidence of pneumothorax or hemothorax. Musculoskeletal: No acute fractures or suspicious bone lesions identified. Old compression fractures and multiple vertebroplasties are noted in the lower thoracic spine. CT ABDOMEN PELVIS FINDINGS Hepatobiliary: No hepatic laceration or mass identified. Mild diffuse hepatic steatosis is noted. Gallbladder is unremarkable. No evidence of biliary ductal dilatation. Pancreas: No parenchymal laceration, mass, or inflammatory changes identified. Spleen: No evidence of splenic laceration. Adrenal/Urinary Tract: No hemorrhage or parenchymal lacerations identified. No evidence of mass or hydronephrosis. Unremarkable unopacified urinary bladder. Stomach/Bowel: Unopacified bowel loops are unremarkable in appearance. No evidence of hemoperitoneum. Vascular/Lymphatic: No evidence of abdominal aortic injury or retroperitoneal hemorrhage. Aortic atherosclerotic calcification noted. No pathologically enlarged lymph nodes identified. Reproductive: Prior hysterectomy noted. Adnexal regions are unremarkable in appearance. Other: A small right inguinal hernia is seen which contains a loop of small bowel. No evidence of bowel obstruction or strangulation. Musculoskeletal: A nondisplaced fracture is seen involving the right pubis and superior pubic ramus. Multiple previous lumbar vertebroplasties noted. IMPRESSION: Nondisplaced fracture of right pubis and superior pubic ramus. No evidence of internal organ injury. Small right inguinal hernia containing a loop of small bowel. No evidence of bowel obstruction or strangulation. Mild hepatic steatosis. Aortic  Atherosclerosis (ICD10-I70.0). Electronically Signed   By: Marlaine Hind M.D.   On: 10/01/2021 14:53   CT T-SPINE NO CHARGE  Result Date: 10/01/2021 CLINICAL DATA:  Pain.  Fall today. EXAM: CT THORACIC AND LUMBAR SPINE WITH CONTRAST TECHNIQUE: Multiplanar CT images of the thoracic and lumbar spine were reconstructed from contemporary CT of the Chest, Abdomen, and Pelvis CONTRAST:  No additional COMPARISON:  Chest radiographs 04/21/2019. Lumbar spine radiographs 04/08/2013. Lumbar spine MRI 03/05/2007. FINDINGS: CT THORACIC SPINE FINDINGS Alignment: Chronically exaggerated thoracic kyphosis. No significant listhesis in the thoracic spine. Vertebrae: Chronic, previously augmented T10, T11, and T12 compression fractures. Chronic, mild T4, mild-to-moderate T5, and moderate T8 and T9 compression fractures, all similar to the 2020 chest radiographs. Schmorl's nodes at T2, T3, T6, and T7. No definite acute fracture or suspicious osseous lesion. Paraspinal and other soft tissues: No acute abnormality identified in the paraspinal soft tissues. Intrathoracic contents reported separately. Disc levels: Mild thoracic spondylosis without evidence of high-grade stenosis. CT LUMBAR SPINE FINDINGS Segmentation: 5 lumbar type vertebrae. Alignment: Normal. Vertebrae: Chronic, previously augmented L1 and L4 compression fractures. Preserved heights of the other lumbar vertebral bodies without an acute fracture or suspicious osseous lesion identified. Paraspinal and other soft tissues: No acute abnormality identified in the paraspinal soft tissues. Intra-abdominal and pelvic contents reported separately. Disc levels: Mild bilateral neural foraminal stenosis at L3-4 and L4-5 due to disc bulging and mild facet arthrosis. No high-grade spinal stenosis. IMPRESSION: 1. No evidence of acute osseous abnormality in the  thoracic or lumbar spine. 2. Numerous chronic compression fractures. Electronically Signed   By: Logan Bores M.D.   On:  10/01/2021 14:28   CT L-SPINE NO CHARGE  Result Date: 10/01/2021 CLINICAL DATA:  Pain.  Fall today. EXAM: CT THORACIC AND LUMBAR SPINE WITH CONTRAST TECHNIQUE: Multiplanar CT images of the thoracic and lumbar spine were reconstructed from contemporary CT of the Chest, Abdomen, and Pelvis CONTRAST:  No additional COMPARISON:  Chest radiographs 04/21/2019. Lumbar spine radiographs 04/08/2013. Lumbar spine MRI 03/05/2007. FINDINGS: CT THORACIC SPINE FINDINGS Alignment: Chronically exaggerated thoracic kyphosis. No significant listhesis in the thoracic spine. Vertebrae: Chronic, previously augmented T10, T11, and T12 compression fractures. Chronic, mild T4, mild-to-moderate T5, and moderate T8 and T9 compression fractures, all similar to the 2020 chest radiographs. Schmorl's nodes at T2, T3, T6, and T7. No definite acute fracture or suspicious osseous lesion. Paraspinal and other soft tissues: No acute abnormality identified in the paraspinal soft tissues. Intrathoracic contents reported separately. Disc levels: Mild thoracic spondylosis without evidence of high-grade stenosis. CT LUMBAR SPINE FINDINGS Segmentation: 5 lumbar type vertebrae. Alignment: Normal. Vertebrae: Chronic, previously augmented L1 and L4 compression fractures. Preserved heights of the other lumbar vertebral bodies without an acute fracture or suspicious osseous lesion identified. Paraspinal and other soft tissues: No acute abnormality identified in the paraspinal soft tissues. Intra-abdominal and pelvic contents reported separately. Disc levels: Mild bilateral neural foraminal stenosis at L3-4 and L4-5 due to disc bulging and mild facet arthrosis. No high-grade spinal stenosis. IMPRESSION: 1. No evidence of acute osseous abnormality in the thoracic or lumbar spine. 2. Numerous chronic compression fractures. Electronically Signed   By: Logan Bores M.D.   On: 10/01/2021 14:28   DG Chest Portable 1 View  Result Date: 10/01/2021 CLINICAL DATA:   Fall.  Nausea.  Oliguria.  Bruising.  Pain. EXAM: PORTABLE CHEST 1 VIEW COMPARISON:  Chest radiograph 04/21/2019 FINDINGS: AICD noted. Mitral valve prosthesis. The patient is rotated to the left on today's radiograph, reducing diagnostic sensitivity and specificity. Atherosclerotic calcification of the aortic arch. Lower thoracic vertebral augmentations. Bony demineralization. The visualized lungs appear clear, part of the left lung base is obscured by the pulse generator. Mild enlargement of the cardiopericardial silhouette. Discontinuity in the proximal right humerus compatible with fracture. IMPRESSION: 1. No acute findings. 2. Mild enlargement of the cardiopericardial silhouette. 3. AICD and mitral valve prosthesis noted. 4. Fracture of the right proximal humerus partially included on today's exam. Electronically Signed   By: Van Clines M.D.   On: 10/01/2021 12:35   DG Shoulder Right Port  Result Date: 09/30/2021 CLINICAL DATA:  Fall EXAM: PORTABLE RIGHT SHOULDER COMPARISON:  None. FINDINGS: Transverse fracture of the humeral neck. Fracture of the greater tuberosity. No dislocation. Multiple thoracic fractures with vertebroplasty cement. IMPRESSION: Fracture humeral neck and greater tuberosity Electronically Signed   By: Franchot Gallo M.D.   On: 09/30/2021 17:21   DG Tibia/Fibula Right Port  Result Date: 10/01/2021 CLINICAL DATA:  Fall yesterday. Worsening pain. Laceration along the anterior shin. EXAM: PORTABLE RIGHT TIBIA AND FIBULA - 2 VIEW COMPARISON:  Knee radiographs from 08/12/2015 FINDINGS: Vascular calcifications noted. Diffuse subcutaneous edema. No tibial or fibular fracture is identified. Lucency along the soft tissues the anterior shin probably reflecting laceration. IMPRESSION: 1. Laceration along the anterior shin, without underlying fracture. 2. Vascular calcifications. 3. Subcutaneous edema diffusely along the calf. Electronically Signed   By: Van Clines M.D.   On:  10/01/2021 12:37   DG Humerus Right  Result  Date: 09/30/2021 CLINICAL DATA:  Fall EXAM: RIGHT HUMERUS - 2+ VIEW COMPARISON:  None. FINDINGS: Fracture right humeral neck with mild displacement. Fracture extends into the greater tuberosity of the humerus. Normal alignment of the humeral head. IMPRESSION: Transverse fracture proximal humeral neck. Fracture greater tuberosity. Electronically Signed   By: Franchot Gallo M.D.   On: 09/30/2021 17:18   ECHOCARDIOGRAM COMPLETE  Result Date: 10/02/2021    ECHOCARDIOGRAM REPORT   Patient Name:   ZO LOUDON Date of Exam: 10/02/2021 Medical Rec #:  [id number]      Height:       56.0 in Accession #:    [id number]     Weight:       96.8 lb Date of Birth:  February 19, 1932       BSA:          1.304 m Patient Age:    73 years       BP:           120/44 mmHg Patient Gender: F              HR:           88 bpm. Exam Location:  Inpatient Procedure: 2D Echo, Color Doppler, Cardiac Doppler and Intracardiac            Opacification Agent Indications:    fall  History:        Patient has no prior history of Echocardiogram examinations. CHF                 and Cardiomyopathy, Arrythmias:Atrial Fibrillation; Risk                 Factors:Dyslipidemia and Hypertension.                  Mitral Valve: valve is present in the mitral position.  Sonographer:    Ula Lingo RDCS (AE, PE) Referring Phys: [id number] Lequita Halt IMPRESSIONS  1. Left ventricular ejection fraction, by estimation, is 55 to 60%. The left ventricle has normal function. The left ventricle has no regional wall motion abnormalities. Left ventricular diastolic parameters are indeterminate.  2. Pacing wires in RA/RV. Right ventricular systolic function is normal. The right ventricular size is normal.  3. The aortic valve is tricuspid. Aortic valve regurgitation is not visualized. No aortic stenosis is present.  4. The inferior vena cava is normal in size with greater than 50% respiratory variability, suggesting right atrial  pressure of 3 mmHg.  5. Left atrial size was moderately dilated. FINDINGS  Left Ventricle: Left ventricular ejection fraction, by estimation, is 55 to 60%. The left ventricle has normal function. The left ventricle has no regional wall motion abnormalities. The left ventricular internal cavity size was normal in size. There is  no left ventricular hypertrophy. Left ventricular diastolic parameters are indeterminate. Right Ventricle: Pacing wires in RA/RV. The right ventricular size is normal. No increase in right ventricular wall thickness. Right ventricular systolic function is normal. Left Atrium: Left atrial size was moderately dilated. Right Atrium: Right atrial size was normal in size. Pericardium: There is no evidence of pericardial effusion. Mitral Valve: No details provided by tech. MV appears to have been repaired with annuloplasty ring no significant residual MR Mean diastolic gradient at HR 86 bpm only 3 mmHg. The mitral valve has been repaired/replaced. No evidence of mitral valve regurgitation. There is a present in the mitral position. No evidence of mitral valve stenosis. MV peak gradient, 9.1 mmHg. The  mean mitral valve gradient is 3.0 mmHg. Tricuspid Valve: The tricuspid valve is normal in structure. Tricuspid valve regurgitation is mild . No evidence of tricuspid stenosis. Aortic Valve: The aortic valve is tricuspid. Aortic valve regurgitation is not visualized. No aortic stenosis is present. Pulmonic Valve: The pulmonic valve was normal in structure. Pulmonic valve regurgitation is not visualized. No evidence of pulmonic stenosis. Aorta: The aortic root is normal in size and structure. Venous: The inferior vena cava is normal in size with greater than 50% respiratory variability, suggesting right atrial pressure of 3 mmHg. IAS/Shunts: No atrial level shunt detected by color flow Doppler.  LEFT VENTRICLE PLAX 2D LVIDd:         4.50 cm LVIDs:         3.00 cm LV PW:         0.90 cm LV IVS:         0.80 cm LVOT diam:     1.90 cm LV SV:         46 LV SV Index:   35 LVOT Area:     2.84 cm  RIGHT VENTRICLE             IVC RV S prime:     10.10 cm/s  IVC diam: 1.10 cm TAPSE (M-mode): 0.7 cm LEFT ATRIUM             Index        RIGHT ATRIUM           Index LA diam:        4.40 cm 3.37 cm/m   RA Area:     12.20 cm LA Vol (A2C):   70.4 ml 53.99 ml/m  RA Volume:   25.00 ml  19.17 ml/m LA Vol (A4C):   75.4 ml 57.82 ml/m LA Biplane Vol: 74.6 ml 57.21 ml/m  AORTIC VALVE LVOT Vmax:   90.10 cm/s LVOT Vmean:  74.100 cm/s LVOT VTI:    0.163 m  AORTA Ao Root diam: 2.60 cm Ao Asc diam:  2.90 cm MITRAL VALVE                TRICUSPID VALVE MV Area (PHT): 4.29 cm     TR Peak grad:   32.5 mmHg MV Area VTI:   1.73 cm     TR Vmax:        285.00 cm/s MV Peak grad:  9.1 mmHg MV Mean grad:  3.0 mmHg     SHUNTS MV Vmax:       1.51 m/s     Systemic VTI:  0.16 m MV Vmean:      78.5 cm/s    Systemic Diam: 1.90 cm MV Decel Time: 177 msec MV E velocity: 108.00 cm/s MV A velocity: 101.00 cm/s MV E/A ratio:  1.07 Jenkins Rouge MD Electronically signed by Jenkins Rouge MD Signature Date/Time: 10/02/2021/1:39:13 PM    Final    DG Hip Unilat W or Wo Pelvis 2-3 Views Right  Result Date: 09/30/2021 CLINICAL DATA:  Fall EXAM: DG HIP (WITH OR WITHOUT PELVIS) 2-3V RIGHT COMPARISON:  None. FINDINGS: There is no evidence of hip fracture or dislocation. There is no evidence of arthropathy or other focal bone abnormality. Arterial calcification. Prior fracture of L4 with vertebroplasty cement. IMPRESSION: Negative. Electronically Signed   By: Franchot Gallo M.D.   On: 09/30/2021 17:19   CT MAXILLOFACIAL WO CONTRAST  Result Date: 09/30/2021 CLINICAL DATA:  Facial trauma with right orbital bruising. EXAM: CT HEAD WITHOUT  CONTRAST CT MAXILLOFACIAL WITHOUT CONTRAST CT CERVICAL SPINE WITHOUT CONTRAST TECHNIQUE: Multidetector CT imaging of the head, cervical spine, and maxillofacial structures were performed using the standard protocol without  intravenous contrast. Multiplanar CT image reconstructions of the cervical spine and maxillofacial structures were also generated. COMPARISON:  CT scan of the brain June 14, 2015 FINDINGS: CT HEAD FINDINGS Brain: No evidence of acute infarction, hemorrhage, hydrocephalus, extra-axial collection or mass lesion/mass effect. Vascular: Calcified atherosclerosis in the intracranial carotids. Skull: Normal. Negative for fracture or focal lesion. Other: Hematoma and soft tissue swelling in the right supraorbital region. Air in the soft tissues suggests laceration. CT MAXILLOFACIAL FINDINGS Osseous: No fracture or mandibular dislocation. No destructive process. Orbits: Negative. No traumatic or inflammatory finding. Sinuses: Clear. Soft tissues: Soft tissue swelling and hematoma in the right Peri orbital and supraorbital region. Air in the soft tissues consistent with laceration. The underlying globe is in tact. No other soft tissue abnormalities are identified. CT CERVICAL SPINE FINDINGS Alignment: Trace retrolisthesis of C2 versus C3, not seen in 2016. Anterolisthesis of C4 versus C5, unchanged since 2016. Reversal of normal lordosis centered at C5-C6, unchanged since 2016. No other significant malalignment. Skull base and vertebrae: No acute fracture. No primary bone lesion or focal pathologic process. Soft tissues and spinal canal: No prevertebral soft tissue swelling. No other soft tissue abnormalities. Disc levels: Multilevel degenerative disc disease. Facet degenerative changes. Upper chest: Scarring in the apices. No acute abnormalities in the upper lungs. Other: Fracture of the proximal right humerus seen on the scout view. No other abnormalities. IMPRESSION: 1. No acute intracranial abnormalities. 2. Hematoma and soft tissue swelling in the right supraorbital region. No facial bone fractures. 3. Trace retrolisthesis of C2 versus C3, anterolisthesis of C4 versus C5, and reversal of normal lordosis at C5-6 is  favored to be nonacute with no adjacent soft tissue swelling. The findings at C4-5 and C5-6 are unchanged since 2016. Recommend clinical correlation. 4. No fracture in the cervical spine. 5. Fracture of the proximal right humerus, better assessed on the dedicated right shoulder x-ray. Electronically Signed   By: Dorise Bullion III M.D.   On: 09/30/2021 17:22        Scheduled Meds:  allopurinol  100 mg Oral Daily   atorvastatin  40 mg Oral Daily   feeding supplement  237 mL Oral BID BM   furosemide  40 mg Intravenous Daily   heparin  5,000 Units Subcutaneous Q12H   levothyroxine  75 mcg Oral Daily   methylPREDNISolone  4 mg Oral Daily   pantoprazole  40 mg Oral Daily   sodium zirconium cyclosilicate  10 g Oral BID   Continuous Infusions:  sodium chloride       LOS: 1 day       Phillips Climes, MD [facility]   To contact the attending provider between 7A-7P or the covering provider during after hours 7P-7A, please log into the web site [email or web address] and access using universal [place] password for that web site. If you do not have the password, please call the hospital operator.  10/02/2021, 3:21 PM

## 2021-10-02 NOTE — Progress Notes (Signed)
[facility] night cross cover note:  I was notified by patient's RN of hypoglycemia: Specifically, morning BMP showed gluose of 42, with corresponding CBG noted to be 22.  No known history of diabetes, exogenous insulin use, or sulfonylureas as an outpatient.  Verbal order given for amp of D50 x1.  I subsequently initiated hypoglycemic protocol.  Accu-Cheks every hour x7 occurrences, with amp of D50 ordered on prn basis for CBG < 70, followed by every 4 hour Accu-Cheks starting at noon today.      Babs Bertin, DO Hospitalist

## 2021-10-02 NOTE — ED Notes (Signed)
[name] daughter [phone] requesting an update

## 2021-10-02 NOTE — ED Notes (Signed)
While assessing pt. Large hematoma noted above elbow. Arm was wrapped with ace bandage and arm in sling.  All dressing removed. Arm redressed, sling repositioned. Right leg wound weeping. Staples appear clean and dry. Skin tear posterior calf weeping. Fresh dressing applied.

## 2021-10-02 NOTE — ED Notes (Signed)
Paged house coverage and informed of 22 sugar. Verbal order given for  amp D50

## 2021-10-02 NOTE — ED Notes (Signed)
Changed bedding, gown and brief. Pur wik placed. Pt noted to be increased pain when moving. Pt requested pain medication. Pt clean and dry

## 2021-10-02 NOTE — ED Notes (Signed)
Pt returned from Echo. Family at bedside.

## 2021-10-02 NOTE — ED Notes (Addendum)
Patient verbalized comfort before transport to Echo

## 2021-10-02 NOTE — ED Notes (Signed)
Pt provided OJ . Pt verbalized comfort at this time. Staff assisting with providing liquids to drink.

## 2021-10-02 NOTE — ED Notes (Signed)
Pacemaker interrogated. 

## 2021-10-02 NOTE — ED Notes (Signed)
Patient transported to Echo.

## 2021-10-02 NOTE — Progress Notes (Signed)
TR[facility] night cross cover note:  Patient's RN contacted me to discuss the patient's wide pulse pressures, with pulse pressures in 70's to 80's mmHg and corresponding systolic blood pressures in range of the low 100s to the 120s mmHg. per brief chart review, it appears that there is some chronicity associated with the patient's wide pulse pressures, noting pulse pressure of 112 in February 2022, pulse pressure of 94 in November 2021, and 88 in October 2021.  will follow for result of echocardiogram that was ordered by admitting hospitalist.     Babs Bertin, DO Hospitalist

## 2021-10-02 NOTE — ED Notes (Signed)
Paged floor coverage provider about pt's widening pulse pressure.

## 2021-10-02 NOTE — Progress Notes (Signed)
Orthopedic Tech Progress Note Patient Details:  [name] [birth year] [id number]  Patient ID: [name], female   DOB: 1932/07/16, 87 y.o.   MRN: [id number] No OHF; pt is over 72.  [name] 10/02/2021, 12:12 PM

## 2021-10-02 NOTE — Consult Note (Signed)
[facility] Nurse Consult Note: Patient with fall at home. Sustained  right humerus fracture right hip and right pubis fractures. Hematoma to entire right side face/forehead, right lateral arm) Skin tear to right posterior lower leg and skin tear closed with staple to right anterior lower leg.   Reason for Consult:trauma from fall at home with  Wound type:trauma Pressure Injury POA: NA Measurement: Right anterior lower leg with skin tear and intact staple line with edges approximated and minimal drainage noted. Right posterior lower leg with 2 cm x 1.5 cm skin tear with nonapproximated edge and skin flap present. Moderate serosanguinous drainage  no odor.  Right arm with 2 cm x 4 cm x 0.1 cm skin tear Wound JOI:TGPQD red Drainage (amount, consistency, odor) moderate serosanguinous  no odor.  Periwound:Bruising to face, arms and legs.   Dressing procedure/placement/frequency: Cleanse skin tears to right arm, right leg with NS and pat dry. Apply Xeroform gauze to wounds and cover with Abd pads and kerlix/tape. Change daily.  Will not follow at this time.  Please re-consult if needed.  Domenic Moras MSN, RN, FNP-BC [facility] Wound, Ostomy, Continence Nurse Pager [phone]

## 2021-10-02 NOTE — Progress Notes (Signed)
Orthopedic Tech Progress Note Patient Details:  [name] 1932-08-24 [id number]  Patient ID: [name], female   DOB: 1932/11/19, 87 y.o.   MRN: [id number] I assisted with dressing change. Karolee Stamps 10/02/2021, 4:36 AM

## 2021-10-03 ENCOUNTER — Encounter (HOSPITAL_COMMUNITY): Payer: Self-pay | Admitting: Internal Medicine

## 2021-10-03 DIAGNOSIS — D72829 Elevated white blood cell count, unspecified: Secondary | ICD-10-CM

## 2021-10-03 DIAGNOSIS — E875 Hyperkalemia: Secondary | ICD-10-CM

## 2021-10-03 LAB — URINALYSIS, ROUTINE W REFLEX MICROSCOPIC
Bilirubin Urine: NEGATIVE
Glucose, UA: NEGATIVE mg/dL
Hgb urine dipstick: NEGATIVE
Ketones, ur: NEGATIVE mg/dL
Leukocytes,Ua: NEGATIVE
Nitrite: NEGATIVE
Protein, ur: NEGATIVE mg/dL
Specific Gravity, Urine: 1.023 (ref 1.005–1.030)
pH: 5 (ref 5.0–8.0)

## 2021-10-03 LAB — BASIC METABOLIC PANEL
Anion gap: 9 (ref 5–15)
BUN: 58 mg/dL — ABNORMAL HIGH (ref 8–23)
CO2: 20 mmol/L — ABNORMAL LOW (ref 22–32)
Calcium: 7.1 mg/dL — ABNORMAL LOW (ref 8.9–10.3)
Chloride: 108 mmol/L (ref 98–111)
Creatinine, Ser: 1.97 mg/dL — ABNORMAL HIGH (ref 0.44–1.00)
GFR, Estimated: 24 mL/min — ABNORMAL LOW (ref >60)
Glucose, Bld: 120 mg/dL — ABNORMAL HIGH (ref 70–99)
Potassium: 4.9 mmol/L (ref 3.5–5.1)
Sodium: 137 mmol/L (ref 135–145)

## 2021-10-03 LAB — GLUCOSE, CAPILLARY
Glucose-Capillary: 106 mg/dL — ABNORMAL HIGH (ref 70–99)
Glucose-Capillary: 120 mg/dL — ABNORMAL HIGH (ref 70–99)
Glucose-Capillary: 125 mg/dL — ABNORMAL HIGH (ref 70–99)
Glucose-Capillary: 138 mg/dL — ABNORMAL HIGH (ref 70–99)
Glucose-Capillary: 142 mg/dL — ABNORMAL HIGH (ref 70–99)
Glucose-Capillary: 148 mg/dL — ABNORMAL HIGH (ref 70–99)

## 2021-10-03 LAB — CBC
HCT: 26.4 % — ABNORMAL LOW (ref 36.0–46.0)
Hemoglobin: 8.8 g/dL — ABNORMAL LOW (ref 12.0–15.0)
MCH: 32.6 pg (ref 26.0–34.0)
MCHC: 33.3 g/dL (ref 30.0–36.0)
MCV: 97.8 fL (ref 80.0–100.0)
Platelets: 121 10*3/uL — ABNORMAL LOW (ref 150–400)
RBC: 2.7 MIL/uL — ABNORMAL LOW (ref 3.87–5.11)
RDW: 16.9 % — ABNORMAL HIGH (ref 11.5–15.5)
WBC: 20.8 10*3/uL — ABNORMAL HIGH (ref 4.0–10.5)
nRBC: 0.1 % (ref 0.0–0.2)

## 2021-10-03 LAB — MAGNESIUM: Magnesium: 2.5 mg/dL — ABNORMAL HIGH (ref 1.7–2.4)

## 2021-10-03 LAB — PROCALCITONIN: Procalcitonin: 10.18 ng/mL

## 2021-10-03 MED ORDER — SODIUM CHLORIDE 0.9 % IV SOLN
1.0000 g | Freq: Once | INTRAVENOUS | Status: DC
  Filled 2021-10-03: qty 1

## 2021-10-03 MED ORDER — ENSURE ENLIVE PO LIQD
237.0000 mL | Freq: Three times a day (TID) | ORAL | Status: DC
  Administered 2021-10-03 – 2021-10-06 (×10): 237 mL via ORAL

## 2021-10-03 MED ORDER — SENNOSIDES-DOCUSATE SODIUM 8.6-50 MG PO TABS
2.0000 | ORAL_TABLET | Freq: Two times a day (BID) | ORAL | Status: DC
  Administered 2021-10-03 – 2021-10-06 (×5): 2 via ORAL
  Filled 2021-10-03 (×7): qty 2

## 2021-10-03 MED ORDER — POLYETHYLENE GLYCOL 3350 17 G PO PACK
17.0000 g | PACK | Freq: Once | ORAL | Status: AC
  Administered 2021-10-03: 17 g via ORAL
  Filled 2021-10-03: qty 1

## 2021-10-03 MED ORDER — CHLORHEXIDINE GLUCONATE CLOTH 2 % EX PADS
6.0000 | MEDICATED_PAD | Freq: Every day | CUTANEOUS | Status: DC
  Administered 2021-10-03 – 2021-10-06 (×4): 6 via TOPICAL

## 2021-10-03 MED ORDER — ADULT MULTIVITAMIN W/MINERALS CH
1.0000 | ORAL_TABLET | Freq: Every day | ORAL | Status: DC
  Administered 2021-10-03 – 2021-10-06 (×4): 1 via ORAL
  Filled 2021-10-03 (×5): qty 1

## 2021-10-03 MED ORDER — CEFEPIME HCL 2 G IJ SOLR
2.0000 g | INTRAMUSCULAR | Status: DC
  Administered 2021-10-03 – 2021-10-04 (×2): 2 g via INTRAVENOUS
  Filled 2021-10-03 (×3): qty 2

## 2021-10-03 NOTE — Progress Notes (Signed)
PROGRESS NOTE    [name]  VPX:[id number] DOB: April 28, 1932 DOA: 10/01/2021 PCP: Lawerance Cruel, MD   Chief Complaint  Patient presents with   Pain   Fall    Brief Narrative:   [name] is a 87 y.o. female with medical history significant of PAF on Eliquis started recently, nonischemic cardiomyopathy status post CRT and on Lasix, mitral stenosis started post repair, SSS status post PPM, HTN, CKD stage III, chronic iron deficiency anemia, polymyalgia rheumatica on chronic steroid, chronic back pain on narcotics, presented with  fall, fall appears to be most likely in the setting of mechanical setting, In  ED, multiple scan was done which showed transverse fracture of proximal femoral neck and fracture greater tuberosity.  Patient was scheduled to see orthopedic surgery this coming Tuesday for the right arm fracture.  Patient was reassured and sent home.  However overnight, patient felt the pain from the right arm fracture right hip pain not controlled, daughter went to her home this morning, informed her in the recliner, unable to move because of the pain and the feeling nausea, family contacted PCP who recommended patient come back to ED.  Patient was diagnosed with A. fib last year has been on Eliquis.  Her cardiologist recently increased her amlodipine due to uncontrolled hypertension. Patient was rescanned and found patient had right pubic ramus as well as superior pubis fracture.  Hemoglobin 6.8 compared to 7.0 day before, and potassium was elevated at 5.8, so patient was admitted for further work-up.  Assessment & Plan:   Active Problems:   Fall at home, initial encounter   Fall    Acute blood loss anemia -likely secondary to multiple hematomas mainly on the right side of her body including the right forehead, right arm/fracture, pelvic fracture and right leg wound.  . -Transfused 1 unit PRBC, continue to monitor closely and transfuse as needed . -Stool color is brown  and negative Guaiac test in ED. -Continue to hold Eliquis for now -Darted on subcu heparin for DVT prophylaxis   Fall -Cannot recall exact events, but unlikely syncope or presyncope as she thinks she tripped on her rug when it happened . -PPM interrogation, -Telemetry monitoring, so far no arrhythmias or pauses are noted, showing paced rhythm only -2D echo was done, no significant valvular disease, preserved EF   Right humeral neck fracture, right pubic ramus and right supra pubis fracture -Admitting physician D/W on call ortho Dr. Victorino December, both the right arm and right hip fractures are non-operatable and command right arm sling 3 weeks and right hip weightbearing as tolerated and physical therapy. -Orthopedic consult greatly appreciated, she was seen by Silvestre Gunner, regarding further recommendation for ambulatory status with PT.   chronic diastolic CHF decompensation -Severe euvolemic, I will hold Lasix especially with worsening renal function  Leukocytosis -With elevated procalcitonin, leukocytosis trending up, will start empirically on cefepime, will check UA, blood cultures, x-ray with no acute finding on presentation.    AKI on CKD stage IIIb -Juliane is trending up, it is 1.97 today, will discontinue her Lasix, she had evidence of retention, Foley catheter was inserted. -Blood pressure on the low side, hold off ACEI. -Lasix as needed -As needed hydralazine for blood pressure control.   Hyperkalemia -Discontinue potassium supplement -Received 1 dose of Lokelma, no EKG ST changes. -Has normalized, discontinue Lokelma   HTN -BP borderline low, hold off amlodipine, hold off ACEI, hold off Coreg. -As needed hydralazine for now.  Urinary retention -  Foley catheter inserted.   Right calf skin laceration -Probably related to the fall although family denied patient had a skin laceration yesterday. -Stable in the ED, consult wound care. -It was stapled, see pictures  below   PAF -Sinus rhythm, hold off Coreg for borderline low hypotension -Use as needed metoprolol for rate control -Hold Eliquis for now.   Hypothyroidism -resume home dosage of Synthroid.   Polymyalgia rheumatica -Double dose of steroid for acute stress phase.  On PPI.  Leukocytosis -Most likely stress related from her fractures and increased steroids, will check procalcitonin.  Hypoglycemia -No recurrence when she is resumed on a diet.  Will start on laxatives for constipation. Will start on Ensure for poor appetite.  DVT prophylaxis: SCD's, subcu heparin Code Status: Full Family Communication: none at bedside, discussed with daughter [name] by phone 11/7. Disposition:   Status is: Inpatient        Consultants:  Admitting physician discussed with orthopedic Dr. Stann Mainland    Subjective:  complains of pain, generalized weakness, poor appetite, constipation, as well noted to have urinary retention where Foley catheter was inserted. Objective: Vitals:   10/02/21 2027 10/03/21 0100 10/03/21 0439 10/03/21 0802  BP: (!) 139/37  (!) 138/36 (!) 149/41  Pulse: 86  87 84  Resp: 20  18 17   Temp: 99.3 F (37.4 C)  97.7 F (36.5 C) (!) 97.3 F (36.3 C)  TempSrc: Oral  Oral Oral  SpO2: 100%  98% 99%  Weight:  50.9 kg    Height:  4\' 8"  (1.422 m)      Intake/Output Summary (Last 24 hours) at 10/03/2021 1329 Last data filed at 10/03/2021 0620 Gross per 24 hour  Intake --  Output 275 ml  Net -275 ml   Filed Weights   10/03/21 0100  Weight: 50.9 kg    Examination:   Awake Alert, Oriented X 3, No new F.N deficits, Normal affect, frail, with multiple bruising  Symmetrical Chest wall movement, Good air movement bilaterally, CTAB RRR,No Gallops,Rubs or new Murmurs, No Parasternal Heave +ve B.Sounds, Abd Soft,  has suprapubic fullness to palpation no rebound - guarding or rigidity. No Cyanosis, Clubbing or edema,  right lower extremity skin  lacerations.              Data Reviewed: I have personally reviewed following labs and imaging studies  CBC: Recent Labs  Lab 09/30/21 1554 10/01/21 0719 10/01/21 1206 10/02/21 0926 10/03/21 0527  WBC 10.7* 10.5 12.5* 20.3* 20.8*  NEUTROABS 8.8* 6.2  --   --   --   HGB 9.4* 7.0* 6.8* 8.5* 8.8*  HCT 29.4* 22.3* 22.0* 25.6* 26.4*  MCV 103.2* 105.2* 106.3* 97.7 97.8  PLT 207 163 131* 128* 121*    Basic Metabolic Panel: Recent Labs  Lab 09/30/21 1554 10/01/21 0719 10/02/21 0249 10/02/21 0926 10/03/21 0527  NA 140 138 139 138 137  K 5.5* 5.8* 4.5 4.8 4.9  CL 112* 112* 113* 113* 108  CO2 22 20* 15* 18* 20*  GLUCOSE 123* 86 42* 117* 120*  BUN 60* 56* 51* 51* 58*  CREATININE 1.42* 1.50* 1.69* 1.71* 1.97*  CALCIUM 8.8* 7.6* 7.1* 7.0* 7.1*  MG  --   --   --   --  2.5*    GFR: Estimated Creatinine Clearance: 13.4 mL/min (A) (by C-G formula based on SCr of 1.97 mg/dL (H)).  Liver Function Tests: Recent Labs  Lab 09/30/21 1554 10/01/21 0719  AST 35 34  ALT 34 26  ALKPHOS  53 37*  BILITOT 0.6 0.3  PROT 6.1* 4.9*  ALBUMIN 3.5 2.9*    CBG: Recent Labs  Lab 10/02/21 2216 10/03/21 0044 10/03/21 0429 10/03/21 0809 10/03/21 1158  GLUCAP 138* 125* 120* 106* 148*     Recent Results (from the past 240 hour(s))  Resp Panel by RT-PCR (Flu A&B, Covid) Nasopharyngeal Swab     Status: None   Collection Time: 09/30/21  3:39 PM   Specimen: Nasopharyngeal Swab; Nasopharyngeal(NP) swabs in vial transport medium  Result Value Ref Range Status   SARS Coronavirus 2 by RT PCR NEGATIVE NEGATIVE Final    Comment: (NOTE) SARS-CoV-2 target nucleic acids are NOT DETECTED.  The SARS-CoV-2 RNA is generally detectable in upper respiratory specimens during the acute phase of infection. The lowest concentration of SARS-CoV-2 viral copies this assay can detect is 138 copies/mL. A negative result does not preclude SARS-Cov-2 infection and should not be used as the sole  basis for treatment or other patient management decisions. A negative result may occur with  improper specimen collection/handling, submission of specimen other than nasopharyngeal swab, presence of viral mutation(s) within the areas targeted by this assay, and inadequate number of viral copies(<138 copies/mL). A negative result must be combined with clinical observations, patient history, and epidemiological information. The expected result is Negative.  Fact Sheet for Patients:  EntrepreneurPulse.com.au  Fact Sheet for Healthcare Providers:  IncredibleEmployment.be  This test is no t yet approved or cleared by the [place] FDA and  has been authorized for detection and/or diagnosis of SARS-CoV-2 by FDA under an Emergency Use Authorization (EUA). This EUA will remain  in effect (meaning this test can be used) for the duration of the COVID-19 declaration under Section 564(b)(1) of the Act, 21 U.S.C.section 360bbb-3(b)(1), unless the authorization is terminated  or revoked sooner.       Influenza A by PCR NEGATIVE NEGATIVE Final   Influenza B by PCR NEGATIVE NEGATIVE Final    Comment: (NOTE) The Xpert Xpress SARS-CoV-2/FLU/RSV plus assay is intended as an aid in the diagnosis of influenza from Nasopharyngeal swab specimens and should not be used as a sole basis for treatment. Nasal washings and aspirates are unacceptable for Xpert Xpress SARS-CoV-2/FLU/RSV testing.  Fact Sheet for Patients: EntrepreneurPulse.com.au  Fact Sheet for Healthcare Providers: IncredibleEmployment.be  This test is not yet approved or cleared by the [place] FDA and has been authorized for detection and/or diagnosis of SARS-CoV-2 by FDA under an Emergency Use Authorization (EUA). This EUA will remain in effect (meaning this test can be used) for the duration of the COVID-19 declaration under Section 564(b)(1) of the Act,  21 U.S.C. section 360bbb-3(b)(1), unless the authorization is terminated or revoked.  Performed at [facility], [place] [address], [place], [place] [address]          Radiology Studies: CT HEAD WO CONTRAST (5MM)  Result Date: 10/01/2021 CLINICAL DATA:  Poly trauma.  Post fall. EXAM: CT HEAD WITHOUT CONTRAST CT CERVICAL SPINE WITHOUT CONTRAST TECHNIQUE: Multidetector CT imaging of the head and cervical spine was performed following the standard protocol without intravenous contrast. Multiplanar CT image reconstructions of the cervical spine were also generated. COMPARISON:  None. FINDINGS: CT HEAD FINDINGS Brain: No evidence of acute infarction, hemorrhage, hydrocephalus, extra-axial collection or mass lesion/mass effect. Moderate brain parenchymal volume and microangiopathy. Vascular: No hyperdense vessel or unexpected calcification. Skull: Normal. Negative for fracture or focal lesion. Sinuses/Orbits: No acute finding. Other: Right frontal scalp hematoma. CT CERVICAL SPINE FINDINGS Alignment:  1-2 mm the anterolisthesis of C4 on C5 and 1-2 mm posterior listhesis of C5 on C6, likely due to degenerative facet arthropathy. Skull base and vertebrae: No acute fracture. No primary bone lesion or focal pathologic process. Soft tissues and spinal canal: No prevertebral fluid or swelling. No visible canal hematoma. Disc levels:  Multilevel osteoarthritic changes. Upper chest: Negative. Other: None. IMPRESSION: 1. No acute intracranial abnormality. 2. Moderate brain parenchymal atrophy and microangiopathy. 3. No evidence of acute traumatic injury to cervical spine. 4. Multilevel osteoarthritic changes of the cervical spine. 5. 1-2 mm anterolisthesis of C4 on C5 and 1-2 mm posterior listhesis of C5 on C6, likely due to degenerative facet arthropathy. Electronically Signed   By: Fidela Salisbury M.D.   On: 10/01/2021 14:44   CT Cervical Spine Wo Contrast  Result Date: 10/01/2021 CLINICAL DATA:  Poly  trauma.  Post fall. EXAM: CT HEAD WITHOUT CONTRAST CT CERVICAL SPINE WITHOUT CONTRAST TECHNIQUE: Multidetector CT imaging of the head and cervical spine was performed following the standard protocol without intravenous contrast. Multiplanar CT image reconstructions of the cervical spine were also generated. COMPARISON:  None. FINDINGS: CT HEAD FINDINGS Brain: No evidence of acute infarction, hemorrhage, hydrocephalus, extra-axial collection or mass lesion/mass effect. Moderate brain parenchymal volume and microangiopathy. Vascular: No hyperdense vessel or unexpected calcification. Skull: Normal. Negative for fracture or focal lesion. Sinuses/Orbits: No acute finding. Other: Right frontal scalp hematoma. CT CERVICAL SPINE FINDINGS Alignment: 1-2 mm the anterolisthesis of C4 on C5 and 1-2 mm posterior listhesis of C5 on C6, likely due to degenerative facet arthropathy. Skull base and vertebrae: No acute fracture. No primary bone lesion or focal pathologic process. Soft tissues and spinal canal: No prevertebral fluid or swelling. No visible canal hematoma. Disc levels:  Multilevel osteoarthritic changes. Upper chest: Negative. Other: None. IMPRESSION: 1. No acute intracranial abnormality. 2. Moderate brain parenchymal atrophy and microangiopathy. 3. No evidence of acute traumatic injury to cervical spine. 4. Multilevel osteoarthritic changes of the cervical spine. 5. 1-2 mm anterolisthesis of C4 on C5 and 1-2 mm posterior listhesis of C5 on C6, likely due to degenerative facet arthropathy. Electronically Signed   By: Fidela Salisbury M.D.   On: 10/01/2021 14:44   CT CHEST ABDOMEN PELVIS W CONTRAST  Result Date: 10/01/2021 CLINICAL DATA:  Fall down stairs. Blunt trauma. Right-sided chest and abdominal pain. Initial encounter. EXAM: CT CHEST, ABDOMEN, AND PELVIS WITH CONTRAST TECHNIQUE: Multidetector CT imaging of the chest, abdomen and pelvis was performed following the standard protocol during bolus  administration of intravenous contrast. CONTRAST:  79mL OMNIPAQUE IOHEXOL 350 MG/ML SOLN COMPARISON:  None. FINDINGS: CT CHEST FINDINGS Cardiovascular: No evidence of thoracic aortic injury or mediastinal hematoma. No pericardial effusion. Mild-to-moderate cardiomegaly. Pacemaker leads are seen in expected position, and prosthetic mitral valve also noted. Mediastinum/Nodes: No evidence of hemorrhage or pneumomediastinum. No masses or pathologically enlarged lymph nodes identified. Lungs/Pleura: No evidence of pulmonary contusion or other infiltrate. No evidence of pneumothorax or hemothorax. Musculoskeletal: No acute fractures or suspicious bone lesions identified. Old compression fractures and multiple vertebroplasties are noted in the lower thoracic spine. CT ABDOMEN PELVIS FINDINGS Hepatobiliary: No hepatic laceration or mass identified. Mild diffuse hepatic steatosis is noted. Gallbladder is unremarkable. No evidence of biliary ductal dilatation. Pancreas: No parenchymal laceration, mass, or inflammatory changes identified. Spleen: No evidence of splenic laceration. Adrenal/Urinary Tract: No hemorrhage or parenchymal lacerations identified. No evidence of mass or hydronephrosis. Unremarkable unopacified urinary bladder. Stomach/Bowel: Unopacified bowel loops are unremarkable in appearance. No evidence  of hemoperitoneum. Vascular/Lymphatic: No evidence of abdominal aortic injury or retroperitoneal hemorrhage. Aortic atherosclerotic calcification noted. No pathologically enlarged lymph nodes identified. Reproductive: Prior hysterectomy noted. Adnexal regions are unremarkable in appearance. Other: A small right inguinal hernia is seen which contains a loop of small bowel. No evidence of bowel obstruction or strangulation. Musculoskeletal: A nondisplaced fracture is seen involving the right pubis and superior pubic ramus. Multiple previous lumbar vertebroplasties noted. IMPRESSION: Nondisplaced fracture of right  pubis and superior pubic ramus. No evidence of internal organ injury. Small right inguinal hernia containing a loop of small bowel. No evidence of bowel obstruction or strangulation. Mild hepatic steatosis. Aortic Atherosclerosis (ICD10-I70.0). Electronically Signed   By: Marlaine Hind M.D.   On: 10/01/2021 14:53   CT T-SPINE NO CHARGE  Result Date: 10/01/2021 CLINICAL DATA:  Pain.  Fall today. EXAM: CT THORACIC AND LUMBAR SPINE WITH CONTRAST TECHNIQUE: Multiplanar CT images of the thoracic and lumbar spine were reconstructed from contemporary CT of the Chest, Abdomen, and Pelvis CONTRAST:  No additional COMPARISON:  Chest radiographs 04/21/2019. Lumbar spine radiographs 04/08/2013. Lumbar spine MRI 03/05/2007. FINDINGS: CT THORACIC SPINE FINDINGS Alignment: Chronically exaggerated thoracic kyphosis. No significant listhesis in the thoracic spine. Vertebrae: Chronic, previously augmented T10, T11, and T12 compression fractures. Chronic, mild T4, mild-to-moderate T5, and moderate T8 and T9 compression fractures, all similar to the 2020 chest radiographs. Schmorl's nodes at T2, T3, T6, and T7. No definite acute fracture or suspicious osseous lesion. Paraspinal and other soft tissues: No acute abnormality identified in the paraspinal soft tissues. Intrathoracic contents reported separately. Disc levels: Mild thoracic spondylosis without evidence of high-grade stenosis. CT LUMBAR SPINE FINDINGS Segmentation: 5 lumbar type vertebrae. Alignment: Normal. Vertebrae: Chronic, previously augmented L1 and L4 compression fractures. Preserved heights of the other lumbar vertebral bodies without an acute fracture or suspicious osseous lesion identified. Paraspinal and other soft tissues: No acute abnormality identified in the paraspinal soft tissues. Intra-abdominal and pelvic contents reported separately. Disc levels: Mild bilateral neural foraminal stenosis at L3-4 and L4-5 due to disc bulging and mild facet arthrosis. No  high-grade spinal stenosis. IMPRESSION: 1. No evidence of acute osseous abnormality in the thoracic or lumbar spine. 2. Numerous chronic compression fractures. Electronically Signed   By: Logan Bores M.D.   On: 10/01/2021 14:28   CT L-SPINE NO CHARGE  Result Date: 10/01/2021 CLINICAL DATA:  Pain.  Fall today. EXAM: CT THORACIC AND LUMBAR SPINE WITH CONTRAST TECHNIQUE: Multiplanar CT images of the thoracic and lumbar spine were reconstructed from contemporary CT of the Chest, Abdomen, and Pelvis CONTRAST:  No additional COMPARISON:  Chest radiographs 04/21/2019. Lumbar spine radiographs 04/08/2013. Lumbar spine MRI 03/05/2007. FINDINGS: CT THORACIC SPINE FINDINGS Alignment: Chronically exaggerated thoracic kyphosis. No significant listhesis in the thoracic spine. Vertebrae: Chronic, previously augmented T10, T11, and T12 compression fractures. Chronic, mild T4, mild-to-moderate T5, and moderate T8 and T9 compression fractures, all similar to the 2020 chest radiographs. Schmorl's nodes at T2, T3, T6, and T7. No definite acute fracture or suspicious osseous lesion. Paraspinal and other soft tissues: No acute abnormality identified in the paraspinal soft tissues. Intrathoracic contents reported separately. Disc levels: Mild thoracic spondylosis without evidence of high-grade stenosis. CT LUMBAR SPINE FINDINGS Segmentation: 5 lumbar type vertebrae. Alignment: Normal. Vertebrae: Chronic, previously augmented L1 and L4 compression fractures. Preserved heights of the other lumbar vertebral bodies without an acute fracture or suspicious osseous lesion identified. Paraspinal and other soft tissues: No acute abnormality identified in the paraspinal soft tissues. Intra-abdominal and  pelvic contents reported separately. Disc levels: Mild bilateral neural foraminal stenosis at L3-4 and L4-5 due to disc bulging and mild facet arthrosis. No high-grade spinal stenosis. IMPRESSION: 1. No evidence of acute osseous abnormality in  the thoracic or lumbar spine. 2. Numerous chronic compression fractures. Electronically Signed   By: Logan Bores M.D.   On: 10/01/2021 14:28   ECHOCARDIOGRAM COMPLETE  Result Date: 10/02/2021    ECHOCARDIOGRAM REPORT   Patient Name:   ANNALISE MCDIARMID Date of Exam: 10/02/2021 Medical Rec #:  [id number]      Height:       56.0 in Accession #:    [id number]     Weight:       96.8 lb Date of Birth:  May 28, 1932       BSA:          1.304 m Patient Age:    48 years       BP:           120/44 mmHg Patient Gender: F              HR:           88 bpm. Exam Location:  Inpatient Procedure: 2D Echo, Color Doppler, Cardiac Doppler and Intracardiac            Opacification Agent Indications:    fall  History:        Patient has no prior history of Echocardiogram examinations. CHF                 and Cardiomyopathy, Arrythmias:Atrial Fibrillation; Risk                 Factors:Dyslipidemia and Hypertension.                  Mitral Valve: valve is present in the mitral position.  Sonographer:    Ula Lingo RDCS (AE, PE) Referring Phys: [id number] Lequita Halt IMPRESSIONS  1. Left ventricular ejection fraction, by estimation, is 55 to 60%. The left ventricle has normal function. The left ventricle has no regional wall motion abnormalities. Left ventricular diastolic parameters are indeterminate.  2. Pacing wires in RA/RV. Right ventricular systolic function is normal. The right ventricular size is normal.  3. The aortic valve is tricuspid. Aortic valve regurgitation is not visualized. No aortic stenosis is present.  4. The inferior vena cava is normal in size with greater than 50% respiratory variability, suggesting right atrial pressure of 3 mmHg.  5. Left atrial size was moderately dilated. FINDINGS  Left Ventricle: Left ventricular ejection fraction, by estimation, is 55 to 60%. The left ventricle has normal function. The left ventricle has no regional wall motion abnormalities. The left ventricular internal cavity size was  normal in size. There is  no left ventricular hypertrophy. Left ventricular diastolic parameters are indeterminate. Right Ventricle: Pacing wires in RA/RV. The right ventricular size is normal. No increase in right ventricular wall thickness. Right ventricular systolic function is normal. Left Atrium: Left atrial size was moderately dilated. Right Atrium: Right atrial size was normal in size. Pericardium: There is no evidence of pericardial effusion. Mitral Valve: No details provided by tech. MV appears to have been repaired with annuloplasty ring no significant residual MR Mean diastolic gradient at HR 86 bpm only 3 mmHg. The mitral valve has been repaired/replaced. No evidence of mitral valve regurgitation. There is a present in the mitral position. No evidence of mitral valve stenosis. MV peak gradient, 9.1 mmHg. The  mean mitral valve gradient is 3.0 mmHg. Tricuspid Valve: The tricuspid valve is normal in structure. Tricuspid valve regurgitation is mild . No evidence of tricuspid stenosis. Aortic Valve: The aortic valve is tricuspid. Aortic valve regurgitation is not visualized. No aortic stenosis is present. Pulmonic Valve: The pulmonic valve was normal in structure. Pulmonic valve regurgitation is not visualized. No evidence of pulmonic stenosis. Aorta: The aortic root is normal in size and structure. Venous: The inferior vena cava is normal in size with greater than 50% respiratory variability, suggesting right atrial pressure of 3 mmHg. IAS/Shunts: No atrial level shunt detected by color flow Doppler.  LEFT VENTRICLE PLAX 2D LVIDd:         4.50 cm LVIDs:         3.00 cm LV PW:         0.90 cm LV IVS:        0.80 cm LVOT diam:     1.90 cm LV SV:         46 LV SV Index:   35 LVOT Area:     2.84 cm  RIGHT VENTRICLE             IVC RV S prime:     10.10 cm/s  IVC diam: 1.10 cm TAPSE (M-mode): 0.7 cm LEFT ATRIUM             Index        RIGHT ATRIUM           Index LA diam:        4.40 cm 3.37 cm/m   RA Area:      12.20 cm LA Vol (A2C):   70.4 ml 53.99 ml/m  RA Volume:   25.00 ml  19.17 ml/m LA Vol (A4C):   75.4 ml 57.82 ml/m LA Biplane Vol: 74.6 ml 57.21 ml/m  AORTIC VALVE LVOT Vmax:   90.10 cm/s LVOT Vmean:  74.100 cm/s LVOT VTI:    0.163 m  AORTA Ao Root diam: 2.60 cm Ao Asc diam:  2.90 cm MITRAL VALVE                TRICUSPID VALVE MV Area (PHT): 4.29 cm     TR Peak grad:   32.5 mmHg MV Area VTI:   1.73 cm     TR Vmax:        285.00 cm/s MV Peak grad:  9.1 mmHg MV Mean grad:  3.0 mmHg     SHUNTS MV Vmax:       1.51 m/s     Systemic VTI:  0.16 m MV Vmean:      78.5 cm/s    Systemic Diam: 1.90 cm MV Decel Time: 177 msec MV E velocity: 108.00 cm/s MV A velocity: 101.00 cm/s MV E/A ratio:  1.07 Jenkins Rouge MD Electronically signed by Jenkins Rouge MD Signature Date/Time: 10/02/2021/1:39:13 PM    Final         Scheduled Meds:  allopurinol  100 mg Oral Daily   atorvastatin  40 mg Oral Daily   Chlorhexidine Gluconate Cloth  6 each Topical Daily   feeding supplement  237 mL Oral TID BM   heparin  5,000 Units Subcutaneous Q12H   levothyroxine  75 mcg Oral Daily   methylPREDNISolone  4 mg Oral Daily   multivitamin with minerals  1 tablet Oral Daily   pantoprazole  40 mg Oral Daily   Continuous Infusions:     LOS: 2 days  Phillips Climes, MD [facility]   To contact the attending provider between 7A-7P or the covering provider during after hours 7P-7A, please log into the web site [email or web address] and access using universal [place] password for that web site. If you do not have the password, please call the hospital operator.  10/03/2021, 1:29 PM

## 2021-10-03 NOTE — Progress Notes (Signed)
OT Cancellation Note  Patient Details Name: [name] MRN: [id number] DOB: 02/11/1932   Cancelled Treatment:    Reason Eval/Treat Not Completed: Medical issues which prohibited therapy;Patient at procedure or test/ unavailable;Other (comment) (RN and NT with pt to insert foley at the time of arrival, OT evaluation to f/u as approrpiate)  Arafat Cocuzza A Aronda Burford 10/03/2021, 11:39 AM

## 2021-10-03 NOTE — Evaluation (Addendum)
Occupational Therapy Evaluation Patient Details Name: [name] MRN: [id number] DOB: 12-Nov-1932 Today's Date: 10/03/2021   History of Present Illness Pt is a 87 y.o. F who presents after a fall with right humeral neck and greater tuberosity and right pubic ramus fx. Significant PMH: aneurysm, basal cell carcinoma, HTN, left heart failure, osteoporosis, pacemaker, polymyalgia, and compression fractures.   Clinical Impression   [name] was indep with all ADL/IADLs and mobility PTA. She lives in a 1 level home with 1 large STE, she has family near by who can assist intermittently. [name] now requires max A +2 for bed mobility, and 1x sit<>stand. She also requires set up-mod A for upper body ADLs, and up to max A +2 for lower body ADLs. She is limited by multiple R fractures, pain, and WB/ROM restrictions. Pt likely to progress well once pain is controlled, recommend d/c to CIR to progress towards indep baseline and decrease caregiver burden once d/c home.    Recommendations for follow up therapy are one component of a multi-disciplinary discharge planning process, led by the attending physician.  Recommendations may be updated based on patient status, additional functional criteria and insurance authorization.   Follow Up Recommendations  Acute inpatient rehab (3hours/day)    Assistance Recommended at Discharge Frequent or constant Supervision/Assistance  Functional Status Assessment  Patient has had a recent decline in their functional status and demonstrates the ability to make significant improvements in function in a reasonable and predictable amount of time.  Equipment Recommendations  Tub/shower seat       Precautions / Restrictions Precautions Precautions: Fall;Other (comment) Precaution Comments: fragile skin, multiple skin tears Required Braces or Orthoses: Sling Restrictions Weight Bearing Restrictions: Yes RUE Weight Bearing: Non weight bearing RLE Weight Bearing: Weight  bearing as tolerated LLE Weight Bearing: Weight bearing as tolerated Other Position/Activity Restrictions: "Ok to come out of sling for ADL's, but keep elbow close to body. No PROM/AROM until 2-3 weeks."      Mobility Bed Mobility Overal bed mobility: Needs Assistance Bed Mobility: Supine to Sit;Sit to Supine     Supine to sit: Max assist;+2 for physical assistance Sit to supine: Max assist;+2 for physical assistance   General bed mobility comments: MaxA + 2 for supine <> sit, use of bed pad for "helicopter" method to transition, cues for pt to use L hand on rail to assist    Transfers Overall transfer level: Needs assistance Equipment used: None Transfers: Sit to/from Stand Sit to Stand: Mod assist;+2 physical assistance           General transfer comment: ModA + 2 to rise from edge of bed, posterior lean      Balance Overall balance assessment: Needs assistance Sitting-balance support: Feet supported Sitting balance-Leahy Scale: Fair     Standing balance support: Single extremity supported Standing balance-Leahy Scale: Poor Standing balance comment: bracing posteriorly against bed                           ADL either performed or assessed with clinical judgement   ADL Overall ADL's : Needs assistance/impaired Eating/Feeding: Set up;Sitting   Grooming: Minimal assistance;Sitting   Upper Body Bathing: Moderate assistance;Sitting   Lower Body Bathing: Maximal assistance;+2 for physical assistance;Sit to/from stand   Upper Body Dressing : Maximal assistance;Sitting   Lower Body Dressing: Maximal assistance;+2 for physical assistance;Sit to/from stand   Toilet Transfer: Maximal assistance;+2 for physical assistance;Stand-pivot   Toileting- Clothing Manipulation and Hygiene: Maximal  assistance;+2 for physical assistance;Sit to/from stand       Functional mobility during ADLs: Maximal assistance;+2 for physical assistance General ADL Comments:  assist levels due to multiple R sided fractures, pain, generalized weakness and poor activity tolerance     Vision Baseline Vision/History: 0 No visual deficits Patient Visual Report: No change from baseline Vision Assessment?: No apparent visual deficits     Perception     Praxis      Pertinent Vitals/Pain Pain Assessment: Faces Faces Pain Scale: Hurts worst Pain Location: "right side." Pain Descriptors / Indicators: Grimacing;Guarding;Sharp Pain Intervention(s): Limited activity within patient's tolerance;Monitored during session     Hand Dominance     Extremity/Trunk Assessment Upper Extremity Assessment Upper Extremity Assessment: RUE deficits/detail RUE Deficits / Details: humerus fx, non-op management. NWB, no PROM, sling at all times except for ADLs. limited composite flexion & extension. significant edema RUE: Unable to fully assess due to pain;Unable to fully assess due to immobilization RUE Sensation: WNL RUE Coordination: decreased gross motor;decreased fine motor   Lower Extremity Assessment Lower Extremity Assessment: Generalized weakness;RLE deficits/detail;LLE deficits/detail RLE Deficits / Details: Increased edema, pt able to perform LAQ, ankle dorsiflexion 3/5 LLE Deficits / Details: Increased edema. Pt able to perform limited heel slide and SLR. Ankle dorsiflexion 2/5   Cervical / Trunk Assessment Cervical / Trunk Assessment: Kyphotic   Communication Communication Communication: No difficulties   Cognition Arousal/Alertness: Awake/alert Behavior During Therapy: WFL for tasks assessed/performed Overall Cognitive Status: Within Functional Limits for tasks assessed                                 General Comments: A&Ox4. Anxious behaviors, but understandable given injuries and pain. Would benefit from higher level cognitive assessment     General Comments  VSS on 2L O2,    Exercises Exercises: General Lower Extremity General Exercises  - Lower Extremity Long Arc Quad: Both;5 reps;Seated   Shoulder Instructions      Home Living Family/patient expects to be discharged to:: Private residence Living Arrangements: Alone Available Help at Discharge: Family;Available PRN/intermittently Type of Home: House Home Access: Stairs to enter [employer] of Steps: 1 Entrance Stairs-Rails: Left Home Layout: One level     Bathroom Shower/Tub: Occupational psychologist: Standard     Home Equipment: Conservation officer, nature (2 wheels)          Prior Functioning/Environment Prior Level of Function : Independent/Modified Independent             Mobility Comments: no AD ADLs Comments: drives        OT Problem List: Decreased strength;Decreased range of motion;Decreased activity tolerance;Impaired balance (sitting and/or standing);Decreased coordination;Decreased safety awareness;Decreased knowledge of use of DME or AE;Decreased knowledge of precautions;Impaired UE functional use;Pain;Increased edema      OT Treatment/Interventions: Self-care/ADL training;Therapeutic exercise;Balance training;Patient/family education;DME and/or AE instruction    OT Goals(Current goals can be found in the care plan section) Acute Rehab OT Goals Patient Stated Goal: less pain OT Goal Formulation: With patient Time For Goal Achievement: 10/17/21 Potential to Achieve Goals: Fair  OT Frequency: Min 2X/week   Barriers to D/C: Decreased caregiver support  pt lives alone at baseline       Co-evaluation PT/OT/SLP Co-Evaluation/Treatment: Yes Reason for Co-Treatment: Complexity of the patient's impairments (multi-system involvement);For patient/therapist safety;To address functional/ADL transfers PT goals addressed during session: Mobility/safety with mobility OT goals addressed during session: ADL's and self-care  AM-PAC OT "6 Clicks" Daily Activity     Outcome Measure Help from another person eating meals?: A  Little Help from another person taking care of personal grooming?: A Little Help from another person toileting, which includes using toliet, bedpan, or urinal?: A Lot Help from another person bathing (including washing, rinsing, drying)?: A Lot Help from another person to put on and taking off regular upper body clothing?: A Lot Help from another person to put on and taking off regular lower body clothing?: A Lot 6 Click Score: 14   End of Session Equipment Utilized During Treatment: Gait belt;Oxygen (sling) Nurse Communication: Mobility status  Activity Tolerance: Patient limited by pain Patient left: in bed;with call bell/phone within reach;with bed alarm set  OT Visit Diagnosis: Unsteadiness on feet (R26.81);Other abnormalities of gait and mobility (R26.89);Muscle weakness (generalized) (M62.81);History of falling (Z91.81);Pain                Time: 4097-3532 OT Time Calculation (min): 31 min Charges:  OT General Charges $OT Visit: 1 Visit OT Evaluation $OT Eval Moderate Complexity: 1 Mod    Kaydn Kumpf A Kaniel Kiang 10/03/2021, 4:09 PM

## 2021-10-03 NOTE — Consult Note (Signed)
WOC consult requested for wound assessment.  This was already performed on 10/7; please refer to Nickie Retort notes on 11/7, and topical treatment orders have been provided for bedside nurses to perform.   Please re-consult if further assistance is needed.  Thank-you,  Julien Girt MSN, [place], [place], [place], [place]

## 2021-10-03 NOTE — TOC CAGE-AID Note (Signed)
Transition of Care [facility]) - CAGE-AID Screening   Patient Details  Name: [name] MRN: [id number] Date of Birth: [birth year]  Transition of Care [facility]) CM/SW Contact:    Calista Crain C Tarpley-Carter, [place] Phone Number: 10/03/2021, 10:04 AM   Clinical Narrative: Pt is unable to participate in Cage Aid.  Caddie Randle Tarpley-Carter, MSW, LCSW-A Pronouns:  She/Her/Hers [facility] Clinical Social Worker Direct Number:  [phone] [email or web address]  CAGE-AID Screening: Substance Abuse Screening unable to be completed due to: : Patient unable to participate             Substance Abuse Education Offered: No

## 2021-10-03 NOTE — Plan of Care (Signed)
  Problem: Education: Goal: Knowledge of General Education information will improve Description: Including pain rating scale, medication(s)/side effects and non-pharmacologic comfort measures Outcome: Progressing   Problem: Clinical Measurements: Goal: Will remain free from infection Outcome: Progressing Goal: Cardiovascular complication will be avoided Outcome: Progressing   Problem: Nutrition: Goal: Adequate nutrition will be maintained Outcome: Progressing   Problem: Pain Managment: Goal: General experience of comfort will improve Outcome: Progressing

## 2021-10-03 NOTE — Progress Notes (Addendum)
Pharmacy Antibiotic Note  [name] is a 87 y.o. female admitted on 10/01/2021 with sepsis.  Pharmacy has been consulted for cefepime dosing.  WBC increased to 20.8 (on methylprednisolone). PCT up to 10.18, Scr 1.97 (CrCl 13 mL/min). UA negative. Has PCN allergy but has tolerated cephalosporins in the past.  Plan: Start cefepime 2g IV every 24 hours Monitor renal fx, clinical pic, cx results, and LOT  Height: 4\' 8"  (142.2 cm) Weight: 50.9 kg (112 lb 3.4 oz) IBW/kg (Calculated) : 36.3  Temp (24hrs), Avg:98.2 F (36.8 C), Min:97.3 F (36.3 C), Max:99.3 F (37.4 C)  Recent Labs  Lab 09/30/21 1554 10/01/21 0719 10/01/21 1206 10/02/21 0249 10/02/21 0926 10/03/21 0527  WBC 10.7* 10.5 12.5*  --  20.3* 20.8*  CREATININE 1.42* 1.50*  --  1.69* 1.71* 1.97*  LATICACIDVEN 1.3  --   --   --   --   --     Estimated Creatinine Clearance: 13.4 mL/min (A) (by C-G formula based on SCr of 1.97 mg/dL (H)).    Allergies  Allergen Reactions   Pertussis Vaccines Swelling   Sulfa Antibiotics Nausea Only   Amoxicillin Rash and Other (See Comments)    Has patient had a PCN reaction causing immediate rash, facial/tongue/throat swelling, SOB or lightheadedness with hypotension: yes Has patient had a PCN reaction causing severe rash involving mucus membranes or skin necrosis: no Has patient had a PCN reaction that required hospitalization no Has patient had a PCN reaction occurring within the last 10 years: yes If all of the above answers are "NO", then may proceed with Cephalosporin use.     Antimicrobials this admission: Cefepime 11/8 >>   Dose adjustments this admission: N/A  Microbiology results: 11/8 BCx: sent 11/8 UCx: sent   Thank you for allowing pharmacy to be a part of this patient's care.  Antonietta Jewel, PharmD, [place] Clinical Pharmacist  Phone: [phone] 10/03/2021 3:21 PM  Please check AMION for all [place] phone numbers After 10:00 PM, call [place]  [phone]

## 2021-10-03 NOTE — Evaluation (Signed)
Physical Therapy Evaluation Patient Details Name: [name] MRN: [id number] DOB: 1932-04-04 Today's Date: 10/03/2021  History of Present Illness  Pt is a 87 y.o. F who presents after a fall with right humeral neck and greater tuberosity and right pubic ramus fx. Significant PMH: aneurysm, basal cell carcinoma, HTN, left heart failure, osteoporosis, pacemaker, polymyalgia, and compression fractures.  Clinical Impression  Pt admitted with above. Prior to admission, pt lives alone and is independent; she reports she has family close by to assist. Pt presents with decreased functional use of right upper extremity, pain, generalized weakness, impaired balance, decreased skin integrity. Pt requiring two person mod-max assist for bed mobility and transfers to standing. Education provided regarding precautions, sling use, mobility progression. Ordered bilateral Prevalon boots for pressure relief. Pt would benefit from acute inpatient rehab to address deficits, maximize functional mobility and decrease caregiver burden.      Recommendations for follow up therapy are one component of a multi-disciplinary discharge planning process, led by the attending physician.  Recommendations may be updated based on patient status, additional functional criteria and insurance authorization.  Follow Up Recommendations Acute inpatient rehab (3hours/day)    Assistance Recommended at Discharge Frequent or constant Supervision/Assistance  Functional Status Assessment Patient has had a recent decline in their functional status and demonstrates the ability to make significant improvements in function in a reasonable and predictable amount of time.   Equipment Recommendations  BSC/3in1;Wheelchair (measurements PT);Wheelchair cushion (measurements PT)    Recommendations for Other Services Rehab consult     Precautions / Restrictions Precautions Precautions: Fall;Other (comment) Precaution Comments: fragile skin,  multiple skin tears Required Braces or Orthoses: Sling Restrictions Weight Bearing Restrictions: Yes RUE Weight Bearing: Non weight bearing RLE Weight Bearing: Weight bearing as tolerated LLE Weight Bearing: Weight bearing as tolerated Other Position/Activity Restrictions: "Ok to come out of sling for ADL's, but keep elbow close to body. No PROM/AROM until 2-3 weeks."      Mobility  Bed Mobility Overal bed mobility: Needs Assistance Bed Mobility: Supine to Sit;Sit to Supine     Supine to sit: Max assist;+2 for physical assistance Sit to supine: Max assist;+2 for physical assistance   General bed mobility comments: MaxA + 2 for supine <> sit, use of bed pad for "helicopter" method to transition, cues for pt to use L hand on rail to assist    Transfers Overall transfer level: Needs assistance Equipment used: None Transfers: Sit to/from Stand Sit to Stand: Mod assist;+2 physical assistance           General transfer comment: ModA + 2 to rise from edge of bed, posterior lean    Ambulation/Gait                  Stairs            Wheelchair Mobility    Modified Rankin (Stroke Patients Only)       Balance Overall balance assessment: Needs assistance Sitting-balance support: Feet supported Sitting balance-Leahy Scale: Fair     Standing balance support: Single extremity supported Standing balance-Leahy Scale: Poor Standing balance comment: bracing posteriorly against bed                             Pertinent Vitals/Pain Pain Assessment: Faces Faces Pain Scale: Hurts worst Pain Location: "right side." Pain Descriptors / Indicators: Grimacing;Guarding;Sharp Pain Intervention(s): Limited activity within patient's tolerance;Monitored during session;Premedicated before session;Repositioned    Home Living Family/patient  expects to be discharged to:: Private residence Living Arrangements: Alone Available Help at Discharge: Family;Available  PRN/intermittently Type of Home: House Home Access: Stairs to enter Entrance Stairs-Rails: Left Entrance Stairs-Number of Steps: 1   Home Layout: One level Home Equipment: Conservation officer, nature (2 wheels)      Prior Function Prior Level of Function : Independent/Modified Independent             Mobility Comments: no AD ADLs Comments: drives     Hand Dominance        Extremity/Trunk Assessment   Upper Extremity Assessment Upper Extremity Assessment: Defer to OT evaluation    Lower Extremity Assessment Lower Extremity Assessment: Generalized weakness;RLE deficits/detail;LLE deficits/detail RLE Deficits / Details: Increased edema, pt able to perform LAQ, ankle dorsiflexion 3/5 LLE Deficits / Details: Increased edema. Pt able to perform limited heel slide and SLR. Ankle dorsiflexion 2/5    Cervical / Trunk Assessment Cervical / Trunk Assessment: Kyphotic  Communication   Communication: No difficulties  Cognition Arousal/Alertness: Awake/alert Behavior During Therapy: WFL for tasks assessed/performed Overall Cognitive Status: Within Functional Limits for tasks assessed                                 General Comments: A&Ox4. Anxious behaviors, but understandable given injuries and pain. Would benefit from higher level cognitive assessment        General Comments      Exercises General Exercises - Lower Extremity Long Arc Quad: Both;5 reps;Seated   Assessment/Plan    PT Assessment Patient needs continued PT services  PT Problem List Decreased strength;Decreased activity tolerance;Decreased range of motion;Decreased mobility;Decreased balance;Decreased knowledge of precautions;Pain;Decreased skin integrity       PT Treatment Interventions DME instruction;Gait training;Functional mobility training;Therapeutic activities;Therapeutic exercise;Balance training;Patient/family education    PT Goals (Current goals can be found in the Care Plan section)   Acute Rehab PT Goals Patient Stated Goal: less pain PT Goal Formulation: With patient Time For Goal Achievement: 10/17/21 Potential to Achieve Goals: Good    Frequency Min 3X/week   Barriers to discharge        Co-evaluation PT/OT/SLP Co-Evaluation/Treatment: Yes Reason for Co-Treatment: For patient/therapist safety;To address functional/ADL transfers PT goals addressed during session: Mobility/safety with mobility         AM-PAC PT "6 Clicks" Mobility  Outcome Measure Help needed turning from your back to your side while in a flat bed without using bedrails?: Total Help needed moving from lying on your back to sitting on the side of a flat bed without using bedrails?: Total Help needed moving to and from a bed to a chair (including a wheelchair)?: Total Help needed standing up from a chair using your arms (e.g., wheelchair or bedside chair)?: Total Help needed to walk in hospital room?: Total Help needed climbing 3-5 steps with a railing? : Total 6 Click Score: 6    End of Session Equipment Utilized During Treatment: Gait belt;Other (comment) (sling) Activity Tolerance: Patient limited by pain Patient left: in bed;with call bell/phone within reach;with bed alarm set Nurse Communication: Mobility status PT Visit Diagnosis: Unsteadiness on feet (R26.81);Muscle weakness (generalized) (M62.81);Difficulty in walking, not elsewhere classified (R26.2);Pain Pain - Right/Left: Right Pain - part of body: Shoulder;Leg    Time: 1443-1540 PT Time Calculation (min) (ACUTE ONLY): 31 min   Charges:   PT Evaluation $PT Eval Moderate Complexity: 1 Mod          Wyona Almas,  PT, DPT Acute Rehabilitation Services Pager [phone] Office [phone]   Deno Etienne 10/03/2021, 3:32 PM

## 2021-10-03 NOTE — Progress Notes (Signed)
Inpatient Rehab Admissions Coordinator:  ? ?Per therapy recommendations,  patient was screened for CIR candidacy by Rakeen Gaillard, MS, CCC-SLP. At this time, Pt. Appears to be a a potential candidate for CIR. I will place   order for rehab consult per protocol for full assessment. Please contact me any with questions. ? ?Ebba Goll, MS, CCC-SLP ?Rehab Admissions Coordinator  ?[phone] (celll) ?[phone] (office) ? ?

## 2021-10-03 NOTE — Progress Notes (Signed)
Initial Nutrition Assessment  DOCUMENTATION CODES:   Not applicable  INTERVENTION:  -Increase to Ensure Enlive po TID, each supplement provides 350 kcal and 20 grams of protein -MVI with minerals daily  NUTRITION DIAGNOSIS:   Increased nutrient needs related to wound healing as evidenced by estimated needs.  GOAL:   Patient will meet greater than or equal to 90% of their needs  MONITOR:   PO intake, Supplement acceptance, Labs, Weight trends, I & O's, Skin  REASON FOR ASSESSMENT:   Consult Hip fracture protocol  ASSESSMENT:   Pt with PMH significant for PAF, noischemic cardiomyopathy s/p CRT, mitral stenosis s/p repair, SSS s/p PPM, HTN, CKD stg III, chronic iron deficiency anemia, polymyalgia rheumatica on chronic steroids, chronic back pain on narcotics admitted after mechanical fall with R humeral neck fx, R pubic ramus and R supra pubis fx in addition to acute blood loss anemia 2/2 multiple hematomas.  Per Ortho, pt's fractures are non-operable.   Pt unavailable at time of RD visit, working with therapies. Per RN, pt c/o generalized weakness, fatigue, and poor appetite. No PO Intake documented. However, pt with orders for Ensure BID and, per RN, has done well with supplement thus far. Will increase supplement frequency given increased nutrient needs.  Weight history reviewed. No significant weight changes noted.   UOP: 272ml x24 hours I/O: +62ml since admit  Medications: medrol, protonix Labs: Recent Labs  Lab 10/02/21 0249 10/02/21 0926 10/03/21 0527  NA 139 138 137  K 4.5 4.8 4.9  CL 113* 113* 108  CO2 15* 18* 20*  BUN 51* 51* 58*  CREATININE 1.69* 1.71* 1.97*  CALCIUM 7.1* 7.0* 7.1*  MG  --   --  2.5*  GLUCOSE 42* 117* 120*  CBGs: 90-138 x24 hours  NUTRITION - FOCUSED PHYSICAL EXAM: Unable to perform at this time. Will attempt at follow-up.   Diet Order:   Diet Order             DIET SOFT Room service appropriate? Yes; Fluid consistency: Thin   Diet effective now                   EDUCATION NEEDS:   No education needs have been identified at this time  Skin:  Skin Assessment: Reviewed RN Assessment (skin tear R arm, laceration R/L arms)  Last BM:  PTA  Height:   Ht Readings from Last 1 Encounters:  10/03/21 4\' 8"  (1.422 m)    Weight:   Wt Readings from Last 10 Encounters:  10/03/21 50.9 kg  09/30/21 43.9 kg  01/02/21 43.9 kg  10/03/20 44.9 kg  08/31/20 44.2 kg  12/31/19 43.8 kg  04/21/19 44.6 kg  03/31/19 44.6 kg  05/01/18 46.3 kg  04/16/17 54.3 kg    BMI:  Body mass index is 25.16 kg/m.  Estimated Nutritional Needs:   Kcal:  1300-1500  Protein:  65-75 grams  Fluid:  >1.3L/d    Larkin Ina, MS, RD, LDN (she/her/hers) RD pager number and weekend/on-call pager number located in [place].

## 2021-10-03 NOTE — Progress Notes (Signed)
[facility] night cross cover note:  I was notified that telemetry demonstrated 2 brief runs of nonsustained V. tach earlier in the shift, with ensuing spontaneous conversion back to sinus rhythm.  Patient reportedly asymptomatic at the time of these episodes.  Vital signs stable, including normotensive blood pressures.  Results of this morning's labs are currently pending, including that of BMP.  Additionally, I have added on a serum magnesium level.     Babs Bertin, DO Hospitalist

## 2021-10-03 NOTE — Progress Notes (Signed)
PT Cancellation Note  Patient Details Name: AMINATA BUFFALO MRN: [id number] DOB: 1931/12/23   Cancelled Treatment:    Reason Eval/Treat Not Completed: Other (comment) RN placing Foley catheter.  Wyona Almas, PT, DPT Acute Rehabilitation Services Pager [phone] Office [phone]    Deno Etienne 10/03/2021, 11:35 AM

## 2021-10-03 NOTE — Consult Note (Signed)
Reason for Consult:Humerus and pelvic fxs Referring Physician: Phillips Climes Time called: [id number] Time at bedside: [place] is an 87 y.o. female.  HPI: [name] fell at home about 3d ago. She unsure what caused the fall but may have gotten tripped on a rug. She was brought to the ED where x-rays showed a right prox humerus fx and right pubis fx. Orthopedic surgery was called and recommended non-operative management. Questions arose from the rehab department and orthopedics was asked to formally consult. She lives at home alone, is RHD, and did not use any assistive devices to ambulate.  Past Medical History:  Diagnosis Date   Allergic rhinitis    Anemia    iron deficient   Aneurysm (HCC)    Anxiety    Basal cell carcinoma    Chronic anemia    Chronic low back pain    Colon polyps    GERD (gastroesophageal reflux disease)    Hiatal hernia    HTN (hypertension)    Hypercholesteremia    Hyperlipidemia    Hypothyroidism    Insomnia    Left heart failure ([place])    Osteoporosis    S/P forteo treatment as of 02/2009   Pacemaker    Polymyalgia ([place])    rheumatica-steroids per Rheum   Systolic heart failure     Past Surgical History:  Procedure Laterality Date   ABDOMINAL HYSTERECTOMY     ANGIOPLASTY     BACK SURGERY     CARDIAC CATHETERIZATION  10/16/06   CATARACT EXTRACTION     defibulator implant     EP IMPLANTABLE DEVICE N/A 04/10/2016   Procedure: BiV Pacemaker Insertion CRT-P;  Surgeon: Evans Lance, MD;  Location: [facility];  Service: Cardiovascular;  Laterality: N/A;   FOOT SURGERY     MITRAL VALVE REPAIR     skin cancer removal     THYROIDECTOMY     TONSILLECTOMY      Family History  Problem Relation Age of Onset   Stroke Mother        in her 76's   Cancer Father 71       secondary   Colon cancer Brother    Colon cancer Sister    Breast cancer Sister     Social History:  reports that she has never smoked. She has never used smokeless  tobacco. She reports that she does not drink alcohol and does not use drugs.  Allergies:  Allergies  Allergen Reactions   Pertussis Vaccines Swelling   Sulfa Antibiotics Nausea Only   Amoxicillin Rash and Other (See Comments)    Has patient had a PCN reaction causing immediate rash, facial/tongue/throat swelling, SOB or lightheadedness with hypotension: yes Has patient had a PCN reaction causing severe rash involving mucus membranes or skin necrosis: no Has patient had a PCN reaction that required hospitalization no Has patient had a PCN reaction occurring within the last 10 years: yes If all of the above answers are "NO", then may proceed with Cephalosporin use.     Medications: I have reviewed the patient's current medications.  Results for orders placed or performed during the hospital encounter of 10/01/21 (from the past 48 hour(s))  POC occult blood, ED     Status: None   Collection Time: 10/01/21 11:28 AM  Result Value Ref Range   Fecal Occult Bld NEGATIVE NEGATIVE  CBC     Status: Abnormal   Collection Time: 10/01/21 12:06 PM  Result Value  Ref Range   WBC 12.5 (H) 4.0 - 10.5 K/uL   RBC 2.07 (L) 3.87 - 5.11 MIL/uL   Hemoglobin 6.8 (LL) 12.0 - 15.0 g/dL    Comment: REPEATED TO VERIFY THIS CRITICAL RESULT HAS VERIFIED AND BEEN CALLED TO R HARDY RN BY KYUNG BAEK ON 11 06 2022 AT 6720, AND HAS BEEN READ BACK.     HCT 22.0 (L) 36.0 - 46.0 %   MCV 106.3 (H) 80.0 - 100.0 fL   MCH 32.9 26.0 - 34.0 pg   MCHC 30.9 30.0 - 36.0 g/dL   RDW 15.6 (H) 11.5 - 15.5 %   Platelets 131 (L) 150 - 400 K/uL   nRBC 0.2 0.0 - 0.2 %    Comment: Performed at [facility] [address] [place], [place] [address]  Type and screen [place]     Status: None   Collection Time: 10/01/21  1:46 PM  Result Value Ref Range   ABO/RH(D) O POS    Antibody Screen NEG    Sample Expiration 10/04/2021,2359    Unit Number [id number]    Blood Component Type RED CELLS,LR    Unit  division 00    Status of Unit ISSUED,FINAL    Transfusion Status OK TO TRANSFUSE    Crossmatch Result      Compatible Performed at [facility], [place] [address], [place], [place] [address]   Prepare RBC (crossmatch)     Status: None   Collection Time: 10/01/21  2:00 PM  Result Value Ref Range   Order Confirmation      ORDERS RECEIVED TO CROSSMATCH Performed at [facility] [address] [place], [place] [address]   Basic metabolic panel     Status: Abnormal   Collection Time: 10/02/21  2:49 AM  Result Value Ref Range   Sodium 139 135 - 145 mmol/L   Potassium 4.5 3.5 - 5.1 mmol/L   Chloride 113 (H) 98 - 111 mmol/L   CO2 15 (L) 22 - 32 mmol/L   Glucose, Bld 42 (LL) 70 - 99 mg/dL    Comment: Glucose reference range applies only to samples taken after fasting for at least 8 hours. CRITICAL RESULT CALLED TO, READ BACK BY AND VERIFIED WITH: MEEKS L,RN 10/02/21 0317 WAYK    BUN 51 (H) 8 - 23 mg/dL   Creatinine, Ser 1.69 (H) 0.44 - 1.00 mg/dL   Calcium 7.1 (L) 8.9 - 10.3 mg/dL   GFR, Estimated 29 (L) >60 mL/min    Comment: (NOTE) Calculated using the CKD-EPI Creatinine Equation (2021)    Anion gap 11 5 - 15    Comment: Performed at [facility] [address], [place], [place] [address]  CBG monitoring, ED     Status: Abnormal   Collection Time: 10/02/21  3:20 AM  Result Value Ref Range   Glucose-Capillary 22 (LL) 70 - 99 mg/dL    Comment: Glucose reference range applies only to samples taken after fasting for at least 8 hours.   Comment 1 Call MD NNP PA CNM   CBG monitoring, ED     Status: Abnormal   Collection Time: 10/02/21  3:48 AM  Result Value Ref Range   Glucose-Capillary 125 (H) 70 - 99 mg/dL    Comment: Glucose reference range applies only to samples taken after fasting for at least 8 hours.  CBG monitoring, ED     Status: None   Collection Time: 10/02/21  4:59 AM  Result Value Ref Range   Glucose-Capillary 86 70 - 99 mg/dL    Comment: Glucose  reference range applies only to samples taken after fasting for at least 8 hours.  CBG monitoring, ED     Status: None   Collection Time: 10/02/21  5:28 AM  Result Value Ref Range   Glucose-Capillary 85 70 - 99 mg/dL    Comment: Glucose reference range applies only to samples taken after fasting for at least 8 hours.  CBG monitoring, ED     Status: None   Collection Time: 10/02/21  5:56 AM  Result Value Ref Range   Glucose-Capillary 99 70 - 99 mg/dL    Comment: Glucose reference range applies only to samples taken after fasting for at least 8 hours.  CBG monitoring, ED     Status: None   Collection Time: 10/02/21  6:28 AM  Result Value Ref Range   Glucose-Capillary 95 70 - 99 mg/dL    Comment: Glucose reference range applies only to samples taken after fasting for at least 8 hours.  CBG monitoring, ED     Status: Abnormal   Collection Time: 10/02/21  6:57 AM  Result Value Ref Range   Glucose-Capillary 119 (H) 70 - 99 mg/dL    Comment: Glucose reference range applies only to samples taken after fasting for at least 8 hours.  CBG monitoring, ED     Status: Abnormal   Collection Time: 10/02/21  8:07 AM  Result Value Ref Range   Glucose-Capillary 101 (H) 70 - 99 mg/dL    Comment: Glucose reference range applies only to samples taken after fasting for at least 8 hours.   Comment 1 Notify RN    Comment 2 Document in Chart   CBG monitoring, ED     Status: Abnormal   Collection Time: 10/02/21  9:13 AM  Result Value Ref Range   Glucose-Capillary 102 (H) 70 - 99 mg/dL    Comment: Glucose reference range applies only to samples taken after fasting for at least 8 hours.  CBC     Status: Abnormal   Collection Time: 10/02/21  9:26 AM  Result Value Ref Range   WBC 20.3 (H) 4.0 - 10.5 K/uL   RBC 2.62 (L) 3.87 - 5.11 MIL/uL   Hemoglobin 8.5 (L) 12.0 - 15.0 g/dL    Comment: REPEATED TO VERIFY POST TRANSFUSION SPECIMEN    HCT 25.6 (L) 36.0 - 46.0 %   MCV 97.7 80.0 - 100.0 fL    Comment:  REPEATED TO VERIFY DELTA CHECK NOTED    MCH 32.4 26.0 - 34.0 pg   MCHC 33.2 30.0 - 36.0 g/dL   RDW 17.3 (H) 11.5 - 15.5 %   Platelets 128 (L) 150 - 400 K/uL   nRBC 0.2 0.0 - 0.2 %    Comment: Performed at [facility], [place] [address], [place], [place] [address]  Basic metabolic panel     Status: Abnormal   Collection Time: 10/02/21  9:26 AM  Result Value Ref Range   Sodium 138 135 - 145 mmol/L   Potassium 4.8 3.5 - 5.1 mmol/L   Chloride 113 (H) 98 - 111 mmol/L   CO2 18 (L) 22 - 32 mmol/L   Glucose, Bld 117 (H) 70 - 99 mg/dL    Comment: Glucose reference range applies only to samples taken after fasting for at least 8 hours.   BUN 51 (H) 8 - 23 mg/dL   Creatinine, Ser 1.71 (  H) 0.44 - 1.00 mg/dL   Calcium 7.0 (L) 8.9 - 10.3 mg/dL   GFR, Estimated 29 (L) >60 mL/min    Comment: (NOTE) Calculated using the CKD-EPI Creatinine Equation (2021)    Anion gap 7 5 - 15    Comment: Performed at [facility] [address], [place], [place] [address]  Procalcitonin - Baseline     Status: None   Collection Time: 10/02/21  9:38 AM  Result Value Ref Range   Procalcitonin 8.68 ng/mL    Comment:        Interpretation: PCT > 2 ng/mL: Systemic infection (sepsis) is likely, unless other causes are known. (NOTE)       Sepsis PCT Algorithm           Lower Respiratory Tract                                      Infection PCT Algorithm    ----------------------------     ----------------------------         PCT < 0.25 ng/mL                PCT < 0.10 ng/mL          Strongly encourage             Strongly discourage   discontinuation of antibiotics    initiation of antibiotics    ----------------------------     -----------------------------       PCT 0.25 - 0.50 ng/mL            PCT 0.10 - 0.25 ng/mL               OR       >80% decrease in PCT            Discourage initiation of                                            antibiotics      Encourage discontinuation           of  antibiotics    ----------------------------     -----------------------------         PCT >= 0.50 ng/mL              PCT 0.26 - 0.50 ng/mL               AND       <80% decrease in PCT              Encourage initiation of                                             antibiotics       Encourage continuation           of antibiotics    ----------------------------     -----------------------------        PCT >= 0.50 ng/mL                  PCT > 0.50 ng/mL               AND  increase in PCT                  Strongly encourage                                      initiation of antibiotics    Strongly encourage escalation           of antibiotics                                     -----------------------------                                           PCT <= 0.25 ng/mL                                                 OR                                        > 80% decrease in PCT                                      Discontinue / Do not initiate                                             antibiotics  Performed at [facility], [address] [place], [place] [address]   CBG monitoring, ED     Status: None   Collection Time: 10/02/21 10:52 AM  Result Value Ref Range   Glucose-Capillary 90 70 - 99 mg/dL    Comment: Glucose reference range applies only to samples taken after fasting for at least 8 hours.   Comment 1 Notify RN    Comment 2 Document in Chart   CBG monitoring, ED     Status: Abnormal   Collection Time: 10/02/21  1:43 PM  Result Value Ref Range   Glucose-Capillary 100 (H) 70 - 99 mg/dL    Comment: Glucose reference range applies only to samples taken after fasting for at least 8 hours.  CBG monitoring, ED     Status: None   Collection Time: 10/02/21  4:23 PM  Result Value Ref Range   Glucose-Capillary 90 70 - 99 mg/dL    Comment: Glucose reference range applies only to samples taken after fasting for at least 8 hours.  CBG monitoring, ED     Status: None    Collection Time: 10/02/21  5:39 PM  Result Value Ref Range   Glucose-Capillary 95 70 - 99 mg/dL    Comment: Glucose reference range applies only to samples taken after fasting for at least 8 hours.  Glucose, capillary     Status: Abnormal   Collection Time: 10/02/21 10:16 PM  Result Value Ref Range   Glucose-Capillary 138 (H)  70 - 99 mg/dL    Comment: Glucose reference range applies only to samples taken after fasting for at least 8 hours.  Glucose, capillary     Status: Abnormal   Collection Time: 10/03/21 12:44 AM  Result Value Ref Range   Glucose-Capillary 125 (H) 70 - 99 mg/dL    Comment: Glucose reference range applies only to samples taken after fasting for at least 8 hours.  Glucose, capillary     Status: Abnormal   Collection Time: 10/03/21  4:29 AM  Result Value Ref Range   Glucose-Capillary 120 (H) 70 - 99 mg/dL    Comment: Glucose reference range applies only to samples taken after fasting for at least 8 hours.  Procalcitonin     Status: None   Collection Time: 10/03/21  5:27 AM  Result Value Ref Range   Procalcitonin 10.18 ng/mL    Comment:        Interpretation: PCT >= 10 ng/mL: Important systemic inflammatory response, almost exclusively due to severe bacterial sepsis or septic shock. (NOTE)       Sepsis PCT Algorithm           Lower Respiratory Tract                                      Infection PCT Algorithm    ----------------------------     ----------------------------         PCT < 0.25 ng/mL                PCT < 0.10 ng/mL          Strongly encourage             Strongly discourage   discontinuation of antibiotics    initiation of antibiotics    ----------------------------     -----------------------------       PCT 0.25 - 0.50 ng/mL            PCT 0.10 - 0.25 ng/mL               OR       >80% decrease in PCT            Discourage initiation of                                            antibiotics      Encourage discontinuation           of  antibiotics    ----------------------------     -----------------------------         PCT >= 0.50 ng/mL              PCT 0.26 - 0.50 ng/mL                AND       <80% decrease in PCT             Encourage initiation of                                             antibiotics       Encourage continuation           of  antibiotics    ----------------------------     -----------------------------        PCT >= 0.50 ng/mL                  PCT > 0.50 ng/mL               AND         increase in PCT                  Strongly encourage                                      initiation of antibiotics    Strongly encourage escalation           of antibiotics                                     -----------------------------                                           PCT <= 0.25 ng/mL                                                 OR                                        > 80% decrease in PCT                                      Discontinue / Do not initiate                                             antibiotics  Performed at [facility], [address] [place], [place] [address]   CBC     Status: Abnormal   Collection Time: 10/03/21  5:27 AM  Result Value Ref Range   WBC 20.8 (H) 4.0 - 10.5 K/uL   RBC 2.70 (L) 3.87 - 5.11 MIL/uL   Hemoglobin 8.8 (L) 12.0 - 15.0 g/dL   HCT 26.4 (L) 36.0 - 46.0 %   MCV 97.8 80.0 - 100.0 fL   MCH 32.6 26.0 - 34.0 pg   MCHC 33.3 30.0 - 36.0 g/dL   RDW 16.9 (H) 11.5 - 15.5 %   Platelets 121 (L) 150 - 400 K/uL   nRBC 0.1 0.0 - 0.2 %    Comment: Performed at [facility] [address] [place], [place] [address]  Basic metabolic panel     Status: Abnormal   Collection Time: 10/03/21  5:27 AM  Result Value Ref Range   Sodium 137 135 - 145 mmol/L   Potassium 4.9 3.5 - 5.1 mmol/L   Chloride 108 98 - 111 mmol/L   CO2 20 (L) 22 -  32 mmol/L   Glucose, Bld 120 (H) 70 - 99 mg/dL    Comment: Glucose reference range applies only to samples taken  after fasting for at least 8 hours.   BUN 58 (H) 8 - 23 mg/dL   Creatinine, Ser 1.97 (H) 0.44 - 1.00 mg/dL   Calcium 7.1 (L) 8.9 - 10.3 mg/dL   GFR, Estimated 24 (L) >60 mL/min    Comment: (NOTE) Calculated using the CKD-EPI Creatinine Equation (2021)    Anion gap 9 5 - 15    Comment: Performed at [facility] [address], [place], [place] [address]  Magnesium     Status: Abnormal   Collection Time: 10/03/21  5:27 AM  Result Value Ref Range   Magnesium 2.5 (H) 1.7 - 2.4 mg/dL    Comment: Performed at [facility] [address] [place], [place] [address]  Glucose, capillary     Status: Abnormal   Collection Time: 10/03/21  8:09 AM  Result Value Ref Range   Glucose-Capillary 106 (H) 70 - 99 mg/dL    Comment: Glucose reference range applies only to samples taken after fasting for at least 8 hours.    CT HEAD WO CONTRAST (5MM)  Result Date: 10/01/2021 CLINICAL DATA:  Poly trauma.  Post fall. EXAM: CT HEAD WITHOUT CONTRAST CT CERVICAL SPINE WITHOUT CONTRAST TECHNIQUE: Multidetector CT imaging of the head and cervical spine was performed following the standard protocol without intravenous contrast. Multiplanar CT image reconstructions of the cervical spine were also generated. COMPARISON:  None. FINDINGS: CT HEAD FINDINGS Brain: No evidence of acute infarction, hemorrhage, hydrocephalus, extra-axial collection or mass lesion/mass effect. Moderate brain parenchymal volume and microangiopathy. Vascular: No hyperdense vessel or unexpected calcification. Skull: Normal. Negative for fracture or focal lesion. Sinuses/Orbits: No acute finding. Other: Right frontal scalp hematoma. CT CERVICAL SPINE FINDINGS Alignment: 1-2 mm the anterolisthesis of C4 on C5 and 1-2 mm posterior listhesis of C5 on C6, likely due to degenerative facet arthropathy. Skull base and vertebrae: No acute fracture. No primary bone lesion or focal pathologic process. Soft tissues and spinal canal: No prevertebral  fluid or swelling. No visible canal hematoma. Disc levels:  Multilevel osteoarthritic changes. Upper chest: Negative. Other: None. IMPRESSION: 1. No acute intracranial abnormality. 2. Moderate brain parenchymal atrophy and microangiopathy. 3. No evidence of acute traumatic injury to cervical spine. 4. Multilevel osteoarthritic changes of the cervical spine. 5. 1-2 mm anterolisthesis of C4 on C5 and 1-2 mm posterior listhesis of C5 on C6, likely due to degenerative facet arthropathy. Electronically Signed   By: Fidela [place] M.D.   On: 10/01/2021 14:44   CT Cervical Spine Wo Contrast  Result Date: 10/01/2021 CLINICAL DATA:  Poly trauma.  Post fall. EXAM: CT HEAD WITHOUT CONTRAST CT CERVICAL SPINE WITHOUT CONTRAST TECHNIQUE: Multidetector CT imaging of the head and cervical spine was performed following the standard protocol without intravenous contrast. Multiplanar CT image reconstructions of the cervical spine were also generated. COMPARISON:  None. FINDINGS: CT HEAD FINDINGS Brain: No evidence of acute infarction, hemorrhage, hydrocephalus, extra-axial collection or mass lesion/mass effect. Moderate brain parenchymal volume and microangiopathy. Vascular: No hyperdense vessel or unexpected calcification. Skull: Normal. Negative for fracture or focal lesion. Sinuses/Orbits: No acute finding. Other: Right frontal scalp hematoma. CT CERVICAL SPINE FINDINGS Alignment: 1-2 mm the anterolisthesis of C4 on C5 and 1-2 mm posterior listhesis of C5 on C6, likely due to degenerative facet arthropathy. Skull base and vertebrae: No acute fracture. No primary bone lesion  or focal pathologic process. Soft tissues and spinal canal: No prevertebral fluid or swelling. No visible canal hematoma. Disc levels:  Multilevel osteoarthritic changes. Upper chest: Negative. Other: None. IMPRESSION: 1. No acute intracranial abnormality. 2. Moderate brain parenchymal atrophy and microangiopathy. 3. No evidence of acute traumatic  injury to cervical spine. 4. Multilevel osteoarthritic changes of the cervical spine. 5. 1-2 mm anterolisthesis of C4 on C5 and 1-2 mm posterior listhesis of C5 on C6, likely due to degenerative facet arthropathy. Electronically Signed   By: Fidela [place] M.D.   On: 10/01/2021 14:44   CT CHEST ABDOMEN PELVIS W CONTRAST  Result Date: 10/01/2021 CLINICAL DATA:  Fall down stairs. Blunt trauma. Right-sided chest and abdominal pain. Initial encounter. EXAM: CT CHEST, ABDOMEN, AND PELVIS WITH CONTRAST TECHNIQUE: Multidetector CT imaging of the chest, abdomen and pelvis was performed following the standard protocol during bolus administration of intravenous contrast. CONTRAST:  64mL OMNIPAQUE IOHEXOL 350 MG/ML SOLN COMPARISON:  None. FINDINGS: CT CHEST FINDINGS Cardiovascular: No evidence of thoracic aortic injury or mediastinal hematoma. No pericardial effusion. Mild-to-moderate cardiomegaly. Pacemaker leads are seen in expected position, and prosthetic mitral valve also noted. Mediastinum/Nodes: No evidence of hemorrhage or pneumomediastinum. No masses or pathologically enlarged lymph nodes identified. Lungs/Pleura: No evidence of pulmonary contusion or other infiltrate. No evidence of pneumothorax or hemothorax. Musculoskeletal: No acute fractures or suspicious bone lesions identified. Old compression fractures and multiple vertebroplasties are noted in the lower thoracic spine. CT ABDOMEN PELVIS FINDINGS Hepatobiliary: No hepatic laceration or mass identified. Mild diffuse hepatic steatosis is noted. Gallbladder is unremarkable. No evidence of biliary ductal dilatation. Pancreas: No parenchymal laceration, mass, or inflammatory changes identified. Spleen: No evidence of splenic laceration. Adrenal/Urinary Tract: No hemorrhage or parenchymal lacerations identified. No evidence of mass or hydronephrosis. Unremarkable unopacified urinary bladder. Stomach/Bowel: Unopacified bowel loops are unremarkable in  appearance. No evidence of hemoperitoneum. Vascular/Lymphatic: No evidence of abdominal aortic injury or retroperitoneal hemorrhage. Aortic atherosclerotic calcification noted. No pathologically enlarged lymph nodes identified. Reproductive: Prior hysterectomy noted. Adnexal regions are unremarkable in appearance. Other: A small right inguinal hernia is seen which contains a loop of small bowel. No evidence of bowel obstruction or strangulation. Musculoskeletal: A nondisplaced fracture is seen involving the right pubis and superior pubic ramus. Multiple previous lumbar vertebroplasties noted. IMPRESSION: Nondisplaced fracture of right pubis and superior pubic ramus. No evidence of internal organ injury. Small right inguinal hernia containing a loop of small bowel. No evidence of bowel obstruction or strangulation. Mild hepatic steatosis. Aortic Atherosclerosis (ICD10-I70.0). Electronically Signed   By: Marlaine Hind M.D.   On: 10/01/2021 14:53   CT T-SPINE NO CHARGE  Result Date: 10/01/2021 CLINICAL DATA:  Pain.  Fall today. EXAM: CT THORACIC AND LUMBAR SPINE WITH CONTRAST TECHNIQUE: Multiplanar CT images of the thoracic and lumbar spine were reconstructed from contemporary CT of the Chest, Abdomen, and Pelvis CONTRAST:  No additional COMPARISON:  Chest radiographs 04/21/2019. Lumbar spine radiographs 04/08/2013. Lumbar spine MRI 03/05/2007. FINDINGS: CT THORACIC SPINE FINDINGS Alignment: Chronically exaggerated thoracic kyphosis. No significant listhesis in the thoracic spine. Vertebrae: Chronic, previously augmented T10, T11, and T12 compression fractures. Chronic, mild T4, mild-to-moderate T5, and moderate T8 and T9 compression fractures, all similar to the 2020 chest radiographs. Schmorl's nodes at T2, T3, T6, and T7. No definite acute fracture or suspicious osseous lesion. Paraspinal and other soft tissues: No acute abnormality identified in the paraspinal soft tissues. Intrathoracic contents reported  separately. Disc levels: Mild thoracic spondylosis without evidence of high-grade stenosis.  CT LUMBAR SPINE FINDINGS Segmentation: 5 lumbar type vertebrae. Alignment: Normal. Vertebrae: Chronic, previously augmented L1 and L4 compression fractures. Preserved heights of the other lumbar vertebral bodies without an acute fracture or suspicious osseous lesion identified. Paraspinal and other soft tissues: No acute abnormality identified in the paraspinal soft tissues. Intra-abdominal and pelvic contents reported separately. Disc levels: Mild bilateral neural foraminal stenosis at L3-4 and L4-5 due to disc bulging and mild facet arthrosis. No high-grade spinal stenosis. IMPRESSION: 1. No evidence of acute osseous abnormality in the thoracic or lumbar spine. 2. Numerous chronic compression fractures. Electronically Signed   By: Logan Bores M.D.   On: 10/01/2021 14:28   CT L-SPINE NO CHARGE  Result Date: 10/01/2021 CLINICAL DATA:  Pain.  Fall today. EXAM: CT THORACIC AND LUMBAR SPINE WITH CONTRAST TECHNIQUE: Multiplanar CT images of the thoracic and lumbar spine were reconstructed from contemporary CT of the Chest, Abdomen, and Pelvis CONTRAST:  No additional COMPARISON:  Chest radiographs 04/21/2019. Lumbar spine radiographs 04/08/2013. Lumbar spine MRI 03/05/2007. FINDINGS: CT THORACIC SPINE FINDINGS Alignment: Chronically exaggerated thoracic kyphosis. No significant listhesis in the thoracic spine. Vertebrae: Chronic, previously augmented T10, T11, and T12 compression fractures. Chronic, mild T4, mild-to-moderate T5, and moderate T8 and T9 compression fractures, all similar to the 2020 chest radiographs. Schmorl's nodes at T2, T3, T6, and T7. No definite acute fracture or suspicious osseous lesion. Paraspinal and other soft tissues: No acute abnormality identified in the paraspinal soft tissues. Intrathoracic contents reported separately. Disc levels: Mild thoracic spondylosis without evidence of high-grade  stenosis. CT LUMBAR SPINE FINDINGS Segmentation: 5 lumbar type vertebrae. Alignment: Normal. Vertebrae: Chronic, previously augmented L1 and L4 compression fractures. Preserved heights of the other lumbar vertebral bodies without an acute fracture or suspicious osseous lesion identified. Paraspinal and other soft tissues: No acute abnormality identified in the paraspinal soft tissues. Intra-abdominal and pelvic contents reported separately. Disc levels: Mild bilateral neural foraminal stenosis at L3-4 and L4-5 due to disc bulging and mild facet arthrosis. No high-grade spinal stenosis. IMPRESSION: 1. No evidence of acute osseous abnormality in the thoracic or lumbar spine. 2. Numerous chronic compression fractures. Electronically Signed   By: Logan Bores M.D.   On: 10/01/2021 14:28   DG Chest Portable 1 View  Result Date: 10/01/2021 CLINICAL DATA:  Fall.  Nausea.  Oliguria.  Bruising.  Pain. EXAM: PORTABLE CHEST 1 VIEW COMPARISON:  Chest radiograph 04/21/2019 FINDINGS: AICD noted. Mitral valve prosthesis. The patient is rotated to the left on today's radiograph, reducing diagnostic sensitivity and specificity. Atherosclerotic calcification of the aortic arch. Lower thoracic vertebral augmentations. Bony demineralization. The visualized lungs appear clear, part of the left lung base is obscured by the pulse generator. Mild enlargement of the cardiopericardial silhouette. Discontinuity in the proximal right humerus compatible with fracture. IMPRESSION: 1. No acute findings. 2. Mild enlargement of the cardiopericardial silhouette. 3. AICD and mitral valve prosthesis noted. 4. Fracture of the right proximal humerus partially included on today's exam. Electronically Signed   By: Van Clines M.D.   On: 10/01/2021 12:35   DG Tibia/Fibula Right Port  Result Date: 10/01/2021 CLINICAL DATA:  Fall yesterday. Worsening pain. Laceration along the anterior shin. EXAM: PORTABLE RIGHT TIBIA AND FIBULA - 2 VIEW  COMPARISON:  Knee radiographs from 08/12/2015 FINDINGS: Vascular calcifications noted. Diffuse subcutaneous edema. No tibial or fibular fracture is identified. Lucency along the soft tissues the anterior shin probably reflecting laceration. IMPRESSION: 1. Laceration along the anterior shin, without underlying fracture. 2. Vascular calcifications. 3.  Subcutaneous edema diffusely along the calf. Electronically Signed   By: Van Clines M.D.   On: 10/01/2021 12:37   ECHOCARDIOGRAM COMPLETE  Result Date: 10/02/2021    ECHOCARDIOGRAM REPORT   Patient Name:   [name] Date of Exam: 10/02/2021 Medical Rec #:  [id number]      Height:       56.0 in Accession #:    [id number]     Weight:       96.8 lb Date of Birth:  1932/03/29       BSA:          1.304 m Patient Age:    36 years       BP:           120/44 mmHg Patient Gender: F              HR:           88 bpm. Exam Location:  Inpatient Procedure: 2D Echo, Color Doppler, Cardiac Doppler and Intracardiac            Opacification Agent Indications:    fall  History:        Patient has no prior history of Echocardiogram examinations. CHF                 and Cardiomyopathy, Arrythmias:Atrial Fibrillation; Risk                 Factors:Dyslipidemia and Hypertension.                  Mitral Valve: valve is present in the mitral position.  Sonographer:    Ula Lingo RDCS (AE, PE) Referring Phys: [id number] Lequita Halt IMPRESSIONS  1. Left ventricular ejection fraction, by estimation, is 55 to 60%. The left ventricle has normal function. The left ventricle has no regional wall motion abnormalities. Left ventricular diastolic parameters are indeterminate.  2. Pacing wires in RA/RV. Right ventricular systolic function is normal. The right ventricular size is normal.  3. The aortic valve is tricuspid. Aortic valve regurgitation is not visualized. No aortic stenosis is present.  4. The inferior vena cava is normal in size with greater than 50% respiratory variability,  suggesting right atrial pressure of 3 mmHg.  5. Left atrial size was moderately dilated. FINDINGS  Left Ventricle: Left ventricular ejection fraction, by estimation, is 55 to 60%. The left ventricle has normal function. The left ventricle has no regional wall motion abnormalities. The left ventricular internal cavity size was normal in size. There is  no left ventricular hypertrophy. Left ventricular diastolic parameters are indeterminate. Right Ventricle: Pacing wires in RA/RV. The right ventricular size is normal. No increase in right ventricular wall thickness. Right ventricular systolic function is normal. Left Atrium: Left atrial size was moderately dilated. Right Atrium: Right atrial size was normal in size. Pericardium: There is no evidence of pericardial effusion. Mitral Valve: No details provided by tech. MV appears to have been repaired with annuloplasty ring no significant residual MR Mean diastolic gradient at HR 86 bpm only 3 mmHg. The mitral valve has been repaired/replaced. No evidence of mitral valve regurgitation. There is a present in the mitral position. No evidence of mitral valve stenosis. MV peak gradient, 9.1 mmHg. The mean mitral valve gradient is 3.0 mmHg. Tricuspid Valve: The tricuspid valve is normal in structure. Tricuspid valve regurgitation is mild . No evidence of tricuspid stenosis. Aortic Valve: The aortic valve is tricuspid. Aortic valve regurgitation is not visualized. No  aortic stenosis is present. Pulmonic Valve: The pulmonic valve was normal in structure. Pulmonic valve regurgitation is not visualized. No evidence of pulmonic stenosis. Aorta: The aortic root is normal in size and structure. Venous: The inferior vena cava is normal in size with greater than 50% respiratory variability, suggesting right atrial pressure of 3 mmHg. IAS/Shunts: No atrial level shunt detected by color flow Doppler.  LEFT VENTRICLE PLAX 2D LVIDd:         4.50 cm LVIDs:         3.00 cm LV PW:          0.90 cm LV IVS:        0.80 cm LVOT diam:     1.90 cm LV SV:         46 LV SV Index:   35 LVOT Area:     2.84 cm  RIGHT VENTRICLE             IVC RV S prime:     10.10 cm/s  IVC diam: 1.10 cm TAPSE (M-mode): 0.7 cm LEFT ATRIUM             Index        RIGHT ATRIUM           Index LA diam:        4.40 cm 3.37 cm/m   RA Area:     12.20 cm LA Vol (A2C):   70.4 ml 53.99 ml/m  RA Volume:   25.00 ml  19.17 ml/m LA Vol (A4C):   75.4 ml 57.82 ml/m LA Biplane Vol: 74.6 ml 57.21 ml/m  AORTIC VALVE LVOT Vmax:   90.10 cm/s LVOT Vmean:  74.100 cm/s LVOT VTI:    0.163 m  AORTA Ao Root diam: 2.60 cm Ao Asc diam:  2.90 cm MITRAL VALVE                TRICUSPID VALVE MV Area (PHT): 4.29 cm     TR Peak grad:   32.5 mmHg MV Area VTI:   1.73 cm     TR Vmax:        285.00 cm/s MV Peak grad:  9.1 mmHg MV Mean grad:  3.0 mmHg     SHUNTS MV Vmax:       1.51 m/s     Systemic VTI:  0.16 m MV Vmean:      78.5 cm/s    Systemic Diam: 1.90 cm MV Decel Time: 177 msec MV E velocity: 108.00 cm/s MV A velocity: 101.00 cm/s MV E/A ratio:  1.07 Jenkins Rouge MD Electronically signed by Jenkins Rouge MD Signature Date/Time: 10/02/2021/1:39:13 PM    Final     Review of Systems  HENT:  Negative for ear discharge, ear pain, hearing loss and tinnitus.   Eyes:  Negative for photophobia and pain.  Respiratory:  Negative for cough and shortness of breath.   Cardiovascular:  Negative for chest pain.  Gastrointestinal:  Negative for abdominal pain, nausea and vomiting.  Genitourinary:  Negative for dysuria, flank pain, frequency and urgency.  Musculoskeletal:  Positive for arthralgias (Right shoulder/pelvic). Negative for back pain, myalgias and neck pain.  Neurological:  Negative for dizziness and headaches.  Hematological:  Does not bruise/bleed easily.  Psychiatric/Behavioral:  The patient is not nervous/anxious.   Blood pressure (!) 149/41, pulse 84, temperature (!) 97.3 F (36.3 C), temperature source Oral, resp. rate 17, height 4\' 8"   (1.422 m), weight 50.9 kg, SpO2 99 %. Physical Exam Constitutional:  General: She is not in acute distress.    Appearance: She is well-developed. She is not diaphoretic.  HENT:     Head: Normocephalic and atraumatic.  Eyes:     General: No scleral icterus.       Right eye: No discharge.        Left eye: No discharge.     Conjunctiva/sclera: Conjunctivae normal.  Cardiovascular:     Rate and Rhythm: Normal rate and regular rhythm.  Pulmonary:     Effort: Pulmonary effort is normal. No respiratory distress.  Musculoskeletal:     Cervical back: Normal range of motion.     Comments: Right shoulder, elbow, wrist, digits- no skin wounds, mod TTP shoulder, no instability, no blocks to motion  Sens  Ax/R/M/U intact  Mot   Ax/ R/ PIN/ M/ AIN/ U intact  Rad 2+  RLE No traumatic wounds, ecchymosis, or rash  Nontender  No knee or ankle effusion  Knee stable to varus/ valgus and anterior/posterior stress  Sens DPN, SPN, TN intact  Motor EHL, ext, flex, evers 5/5  DP 1+, PT 1+, No significant edema  Skin:    General: Skin is warm and dry.  Neurological:     Mental Status: She is alert.  Psychiatric:        Mood and Affect: Mood normal.        Behavior: Behavior normal.    Assessment/Plan: Right humerus fx -- Plan non-operative management with sling, NWB. Ok to come out of sling for ADL's but keep elbow close to body. No PROM/AROM at this point, will start that in 2-3 weeks to prevent stiffness. Right pubic ramus fx -- May be WBAT BLE. F/u with Dr. Stann Mainland in 2-3 weeks.    Lisette Abu, PA-C Orthopedic Surgery [phone] 10/03/2021, 10:55 AM

## 2021-10-04 ENCOUNTER — Inpatient Hospital Stay (HOSPITAL_COMMUNITY): Payer: Medicare Other

## 2021-10-04 DIAGNOSIS — N179 Acute kidney failure, unspecified: Secondary | ICD-10-CM

## 2021-10-04 DIAGNOSIS — Y92009 Unspecified place in unspecified non-institutional (private) residence as the place of occurrence of the external cause: Secondary | ICD-10-CM

## 2021-10-04 DIAGNOSIS — W19XXXA Unspecified fall, initial encounter: Secondary | ICD-10-CM

## 2021-10-04 LAB — BASIC METABOLIC PANEL
Anion gap: 9 (ref 5–15)
BUN: 68 mg/dL — ABNORMAL HIGH (ref 8–23)
CO2: 21 mmol/L — ABNORMAL LOW (ref 22–32)
Calcium: 7.1 mg/dL — ABNORMAL LOW (ref 8.9–10.3)
Chloride: 109 mmol/L (ref 98–111)
Creatinine, Ser: 1.96 mg/dL — ABNORMAL HIGH (ref 0.44–1.00)
GFR, Estimated: 24 mL/min — ABNORMAL LOW (ref >60)
Glucose, Bld: 108 mg/dL — ABNORMAL HIGH (ref 70–99)
Potassium: 5 mmol/L (ref 3.5–5.1)
Sodium: 139 mmol/L (ref 135–145)

## 2021-10-04 LAB — PROCALCITONIN: Procalcitonin: 6.59 ng/mL

## 2021-10-04 LAB — CBC
HCT: 24.7 % — ABNORMAL LOW (ref 36.0–46.0)
Hemoglobin: 7.9 g/dL — ABNORMAL LOW (ref 12.0–15.0)
MCH: 32.1 pg (ref 26.0–34.0)
MCHC: 32 g/dL (ref 30.0–36.0)
MCV: 100.4 fL — ABNORMAL HIGH (ref 80.0–100.0)
Platelets: 127 10*3/uL — ABNORMAL LOW (ref 150–400)
RBC: 2.46 MIL/uL — ABNORMAL LOW (ref 3.87–5.11)
RDW: 16.1 % — ABNORMAL HIGH (ref 11.5–15.5)
WBC: 17.4 10*3/uL — ABNORMAL HIGH (ref 4.0–10.5)
nRBC: 0 % (ref 0.0–0.2)

## 2021-10-04 LAB — GLUCOSE, CAPILLARY
Glucose-Capillary: 110 mg/dL — ABNORMAL HIGH (ref 70–99)
Glucose-Capillary: 118 mg/dL — ABNORMAL HIGH (ref 70–99)
Glucose-Capillary: 130 mg/dL — ABNORMAL HIGH (ref 70–99)
Glucose-Capillary: 102 mg/dL — ABNORMAL HIGH (ref 70–99)
Glucose-Capillary: 162 mg/dL — ABNORMAL HIGH (ref 70–99)
Glucose-Capillary: 144 mg/dL — ABNORMAL HIGH (ref 70–99)

## 2021-10-04 MED ORDER — CARVEDILOL 6.25 MG PO TABS
6.2500 mg | ORAL_TABLET | Freq: Two times a day (BID) | ORAL | Status: DC
  Administered 2021-10-04 – 2021-10-06 (×5): 6.25 mg via ORAL
  Filled 2021-10-04 (×5): qty 1

## 2021-10-04 MED ORDER — AMLODIPINE BESYLATE 5 MG PO TABS
2.5000 mg | ORAL_TABLET | Freq: Every day | ORAL | Status: DC
  Administered 2021-10-04 – 2021-10-06 (×3): 2.5 mg via ORAL
  Filled 2021-10-04 (×3): qty 1

## 2021-10-04 NOTE — Care Management Important Message (Signed)
Important Message  Patient Details  Name: [name] MRN: [id number] Date of Birth: 08-06-1932   Medicare Important Message Given:  Yes     [name] 10/04/2021, 2:42 PM

## 2021-10-04 NOTE — Progress Notes (Signed)
Inpatient Rehabilitation Admissions Coordinator   I met at bedside with patient and then spoke with her daughter, Dee by phone. We discussed goals and expectations of a possible CIR admit pendng that there would be some caregiver supports after CIR. It is not expected that patient would be independent enough to have intermittent assist of caregivers. Family will discuss and follow up with me tomorrow on their caregiver options.  Barbara Boyette, RN, MSN Rehab Admissions Coordinator [phone] 10/04/2021 1:24 PM  

## 2021-10-04 NOTE — TOC Initial Note (Signed)
Transition of Care [facility]) - Initial/Assessment Note    Patient Details  Name: [name] MRN: [id number] Date of Birth: 05-07-1932  [facility] CM/SW Contact:    Sable Feil, LCSW Phone Number: 10/04/2021, 6:04 PM  Clinical Narrative:  CSW talked with [name] and her daughter Mare Ferrari at bedside regarding patient's discharge disposition. CSW acknowledged awareness that inpatient rehab currently assessing patient's appropriateness for inpatient rehab. Ms. [name] informed CSW that she will be talking with Pamala Hurry tomorrow, and she will be talking with her sisters regarding Inpatient Rehab CIR and support post discharge from CIR.  [name] reported that her mother lives alone and was fully independent and driving prior to coming to the hospital. [name] reported that her mother lives in a one-story home and she lives around the corner from her mom. She added that her mom is not favorable about SNF for rehab and patient and daughter shared regarding experiences with other family members that had been in a facility, including her husband.   Patient and daughter [name] informed that CSW will continue to follow and if she is accepted by CIR, this social worker will not follow-up with them.                Expected Discharge Plan: IP Rehab Facility Barriers to Discharge: Continued Medical Work up   Patient Goals and CMS Choice Patient states their goals for this hospitalization and ongoing recovery are:: Patient and dauighter i agreement with rehab post discharge and would like CIR CMS Medicare.gov Compare Post Acute Care list provided to:: Other (Comment Required) (Not provided at this time) Choice offered to / list presented to :  (Medicare.gov list will be provided to patient/daughtrer if not accepted by CIR and patient/daughters agreeable to ST rehab in a SNF)  Expected Discharge Plan and Services Expected Discharge Plan: [place] In-house Referral:  (Referral  was made to CIR)     Living arrangements for the past 2 months: Single Family Home (Patient lives alone)                                      Prior Living Arrangements/Services Living arrangements for the past 2 months: Single Family Home (Patient lives alone) Lives with:: Self Patient language and need for interpreter reviewed:: No Do you feel safe going back to the place where you live?: No   Patient and daughters agree that she needs therapy at discharge and is hopeful she will be able to transition to CIR once medically stable  Need for Family Participation in Patient Care: Yes (Comment) Care giver support system in place?: Yes (comment)   Criminal Activity/Legal Involvement Pertinent to Current Situation/Hospitalization: No - Comment as needed  Activities of Daily Living Home Assistive Devices/Equipment: None ADL Screening (condition at time of admission) Patient's cognitive ability adequate to safely complete daily activities?: Yes Is the patient deaf or have difficulty hearing?: No Does the patient have difficulty seeing, even when wearing glasses/contacts?: No Does the patient have difficulty concentrating, remembering, or making decisions?: No Patient able to express need for assistance with ADLs?: Yes Does the patient have difficulty dressing or bathing?: Yes Independently performs ADLs?: No Communication: Independent Dressing (OT): Needs assistance Is this a change from baseline?: Pre-admission baseline Grooming: Dependent Is this a change from baseline?: Pre-admission baseline Feeding: Needs assistance Is this a change from baseline?: Pre-admission baseline Bathing: Needs  assistance Is this a change from baseline?: Pre-admission baseline Toileting: Needs assistance Is this a change from baseline?: Pre-admission baseline In/Out Bed: Dependent Is this a change from baseline?: Pre-admission baseline Walks in Home: Independent Does the patient have difficulty  walking or climbing stairs?: Yes Weakness of Legs: Both Weakness of Arms/Hands: Right  Permission Sought/Granted Permission sought to share information with : Family Supports (Patient was agreeable to [place] talking with her daughter)    Share Information with NAME: Mare Ferrari (She was daughter present during [place] visit)     Permission granted to share info w Relationship: Daughter  Permission granted to share info w Contact Information: [phone]  Emotional Assessment Appearance:: Appears stated age Attitude/Demeanor/Rapport: Other (comment) (Patient responded to CSW when asked something, but was mostly quiet during CSW's visit) Affect (typically observed): Appropriate Orientation: : Oriented to Self, Oriented to Place, Oriented to  Time, Oriented to Situation Alcohol / Substance Use: Tobacco Use, Alcohol Use, Illicit Drugs (Per H&P, patient does not smoke, drink alcoholic beverages or use illicit drugs) Psych Involvement: No (comment)  Admission diagnosis:  Hyperkalemia [E87.5] Pain [R52] Fall [W19.XXXA] Leg laceration, right, initial encounter [S81.811A] Symptomatic anemia [D64.9] Other closed nondisplaced fracture of proximal end of right humerus, initial encounter [S42.294A] Closed fracture of right pubis, unspecified portion of pubis, initial encounter [facility]) [S32.501A] Patient Active Problem List   Diagnosis Date Noted   Fall at home, initial encounter 10/01/2021   Fall 10/01/2021   Closed fracture of right pubis ([place])    Symptomatic anemia    Paroxysmal atrial fibrillation ([place]) 08/31/2020   Secondary hypercoagulable state ([place]) 08/31/2020   Hyponatremia 03/28/2019   AKI (acute kidney injury) ([place])    Cardiomyopathy ([place]) 01/25/2014   Mitral valve disorder 01/25/2014   Combined hyperlipidemia 01/25/2014   Biventricular implantable cardioverter-defibrillator in situ 09/81/1914   Chronic systolic heart failure ([place]) 04/24/2011   Hypertension, benign 04/24/2011    Dyslipidemia 04/24/2011   PCP:  Lawerance Cruel, MD Pharmacy:   [facility] [place], [place] [facility] [address] [address] Phone: [phone] Fax: [phone]  [facility] [address], [address] [place] [address] Phone: [phone] Fax: [phone]  [id number] [place] [place] [address] [place] [address] Phone: [phone] Fax: [phone]     Social Determinants of Health (SDOH) Interventions  No SDOH interventions needed or requested at this time.  Readmission Risk Interventions No flowsheet data found.

## 2021-10-04 NOTE — Plan of Care (Signed)

## 2021-10-04 NOTE — Progress Notes (Signed)
[facility]  PROGRESS NOTE  [name] OIB:[id number] DOB: February 26, 1932 DOA: 10/01/2021 PCP: Lawerance Cruel, MD   Brief HPI:    87 year old female with medical history of PAF on Eliquis started recently, nonischemic cardiomyopathy s/p CRT-D, on Lasix, mitral stenosis status postrepair, AAA s/p PPM, hypertension, CKD stage III, chronic iron deficiency anemia, polymyalgia rheumatica on chronic steroid, chronic back pain on narcotics, presented with fall likely in setting of mechanical fall In the ED, multiple scan was done which showed transverse fracture of proximal humeral neck and fracture greater tuberosity.  Patient was scheduled to see orthopedic surgery this coming Tuesday for right arm fracture.  Patient was reassured and sent home. -Overnight patient felt that pain was not controlled so she came to ED. Patient was rescanned in the ED and was found to have right pubic ramus as well as superior pubic ramus fracture.  Potassium was 5.8 hemoglobin was 6.8 so she was  admitted for further work-up.    Subjective   Patient was seen and examined, still complains of right shoulder pain.   Assessment/Plan:     Acute blood loss anemia -Secondary to multiple hematomas menorrhagia body including right forehead, right arm fracture, pelvic fracture right leg wound -S/p 1 unit PRBC -Hemoglobin is 7.9, will transfuse for hemoglobin less than 7 -FOBT is negative -Eliquis on hold -Started on heparin for DVT prophylaxis  Status post fall -Cannot recall exact events but unlikely that patient had syncope or presyncope -She thinks she tripped on the rug -PPM interrogation -2D echo was done which showed no significant valvular disease, preserved EF  Chronic diastolic CHF -Euvolemic -Lasix on hold  Leukocytosis -Unclear etiology -WBCs trending up, procalcitonin elevated -Patient empirically started on cefepime -Chest x-ray was negative, UA is clear, blood cultures obtained  yesterday are negative to date  Acute kidney injury on CKD stage IIIb -Creatinine up to 1.96 -Lasix has been held -ACE inhibitor's are on hold -We will obtain a renal ultrasound -Follow renal function in a.m.  Hypertension -Blood pressure is mildly elevated -Restart Coreg at lower dose of 6.25 mg p.o. twice daily, amlodipine 2.5 mg daily -Continue to hold ACE inhibitor  Urinary retention -Unclear etiology, Foley catheter inserted -Renal ultrasound ordered  Right calf skin ulceration -Wound care consulted  Paroxysmal atrial fibrillation -Coreg low-dose started as above -Eliquis on hold due to bleeding as above  Hypothyroidism -Continue Synthroid  Polymyalgia rheumatica -Patient takes Medrol 2 mg daily at home -Medrol dose has been changed to 4 mg daily for stress dose -Continue Protonix 40 mg daily      Scheduled medications:    allopurinol  100 mg Oral Daily   atorvastatin  40 mg Oral Daily   Chlorhexidine Gluconate Cloth  6 each Topical Daily   feeding supplement  237 mL Oral TID BM   heparin  5,000 Units Subcutaneous Q12H   levothyroxine  75 mcg Oral Daily   methylPREDNISolone  4 mg Oral Daily   multivitamin with minerals  1 tablet Oral Daily   pantoprazole  40 mg Oral Daily   senna-docusate  2 tablet Oral BID     Data Reviewed:   CBG:  Recent Labs  Lab 10/04/21 0007 10/04/21 0420 10/04/21 0835 10/04/21 1139 10/04/21 1605  GLUCAP 118* 110* 130* 102* 162*    SpO2: 99 % O2 Flow Rate (L/min): 2.5 L/min    Vitals:   10/03/21 2021 10/04/21 0503 10/04/21 0845 10/04/21 1736  BP: (!) 137/47 (!) 152/55 (!) 135/47 Marland Kitchen)  148/47  Pulse: 93 95 92 98  Resp: 12 12 12 19   Temp: 98.1 F (36.7 C) 97.9 F (36.6 C) 98.2 F (36.8 C) 98.1 F (36.7 C)  TempSrc: Oral Oral Oral Oral  SpO2: 100% 99% 99% 99%  Weight:      Height:         Intake/Output Summary (Last 24 hours) at 10/04/2021 1843 Last data filed at 10/04/2021 1300 Gross per 24 hour  Intake 514  ml  Output 345 ml  Net 169 ml    11/07 1901 - 11/09 0700 In: 220 [P.O.:120] Out: 9470 [Urine:1470]  Filed Weights   10/03/21 0100  Weight: 50.9 kg    Data Reviewed: Basic Metabolic Panel: Recent Labs  Lab 10/01/21 0719 10/02/21 0249 10/02/21 0926 10/03/21 0527 10/04/21 0452  NA 138 139 138 137 139  K 5.8* 4.5 4.8 4.9 5.0  CL 112* 113* 113* 108 109  CO2 20* 15* 18* 20* 21*  GLUCOSE 86 42* 117* 120* 108*  BUN 56* 51* 51* 58* 68*  CREATININE 1.50* 1.69* 1.71* 1.97* 1.96*  CALCIUM 7.6* 7.1* 7.0* 7.1* 7.1*  MG  --   --   --  2.5*  --    Liver Function Tests: Recent Labs  Lab 09/30/21 1554 10/01/21 0719  AST 35 34  ALT 34 26  ALKPHOS 53 37*  BILITOT 0.6 0.3  PROT 6.1* 4.9*  ALBUMIN 3.5 2.9*   No results for input(s): LIPASE, AMYLASE in the last 168 hours. No results for input(s): AMMONIA in the last 168 hours. CBC: Recent Labs  Lab 09/30/21 1554 10/01/21 0719 10/01/21 1206 10/02/21 0926 10/03/21 0527 10/04/21 0452  WBC 10.7* 10.5 12.5* 20.3* 20.8* 17.4*  NEUTROABS 8.8* 6.2  --   --   --   --   HGB 9.4* 7.0* 6.8* 8.5* 8.8* 7.9*  HCT 29.4* 22.3* 22.0* 25.6* 26.4* 24.7*  MCV 103.2* 105.2* 106.3* 97.7 97.8 100.4*  PLT 207 163 131* 128* 121* 127*   Cardiac Enzymes: Recent Labs  Lab 09/30/21 1554  CKTOTAL 146   BNP (last 3 results) No results for input(s): BNP in the last 8760 hours.  ProBNP (last 3 results) No results for input(s): PROBNP in the last 8760 hours.  CBG: Recent Labs  Lab 10/04/21 0007 10/04/21 0420 10/04/21 0835 10/04/21 1139 10/04/21 1605  GLUCAP 118* 110* 130* 102* 162*       Radiology Reports  US RENAL  Result Date: 10/04/2021 CLINICAL DATA:  Acute kidney injury EXAM: RENAL / URINARY TRACT ULTRASOUND COMPLETE COMPARISON:  CT 10/01/2021 FINDINGS: Right Kidney: Renal measurements: 7.9 x 4.6 x 4.1 cm = volume: 61 mL. Cortex is echogenic. Diffuse cortical thinning consistent with atrophy. No hydronephrosis or mass. Left  Kidney: Renal measurements: 9.5 x 4.8 x 4.5 cm = volume: 107.4 mL. Cortex slightly echogenic. Diffuse cortical thinning. No mass or hydronephrosis Bladder: Decompressed by Foley catheter Other: None. IMPRESSION: 1. Slightly echogenic kidneys with diffuse cortical thinning/atrophy consistent with medical renal disease. 2. Negative for hydronephrosis Electronically Signed   By: Donavan Foil M.D.   On: 10/04/2021 16:52       Antibiotics: Anti-infectives (From admission, onward)    Start     Dose/Rate Route Frequency Ordered Stop   10/03/21 1600  ceFEPIme (MAXIPIME) 2 g in sodium chloride 0.9 % 100 mL IVPB        2 g 200 mL/hr over 30 Minutes Intravenous Every 24 hours 10/03/21 1523     10/03/21 1515  ceFEPIme (MAXIPIME) 1 g in sodium chloride 0.9 % 100 mL IVPB  Status:  Discontinued        1 g 200 mL/hr over 30 Minutes Intravenous  Once 10/03/21 1420 10/03/21 1523         DVT prophylaxis: Heparin  Code Status: Full code  Family Communication: No family at bedside   Consultants:   Procedures:     Objective    Physical Examination:  General-appears in no acute distress Heart-S1-S2, regular, no murmur auscultated Lungs-clear to auscultation bilaterally, no wheezing or crackles auscultated Abdomen-soft, nontender, no organomegaly Extremities-no edema in the lower extremities Right shoulder-in sling Neuro-alert, oriented x3, no focal deficit noted   Status is: Inpatient  Dispo: The patient is from: Home              Anticipated d/c is to: Home              Anticipated d/c date is: 10/08/2021              Patient currently not stable for discharge  Barrier to discharge-none  COVID-19 Labs  No results for input(s): DDIMER, FERRITIN, LDH, CRP in the last 72 hours.  Lab Results  Component Value Date   [place] NEGATIVE 09/30/2021         Recent Results (from the past 240 hour(s))  Resp Panel by RT-PCR (Flu A&B, Covid) Nasopharyngeal Swab     Status:  None   Collection Time: 09/30/21  3:39 PM   Specimen: Nasopharyngeal Swab; Nasopharyngeal(NP) swabs in vial transport medium  Result Value Ref Range Status   SARS Coronavirus 2 by RT PCR NEGATIVE NEGATIVE Final    Comment: (NOTE) SARS-CoV-2 target nucleic acids are NOT DETECTED.  The SARS-CoV-2 RNA is generally detectable in upper respiratory specimens during the acute phase of infection. The lowest concentration of SARS-CoV-2 viral copies this assay can detect is 138 copies/mL. A negative result does not preclude SARS-Cov-2 infection and should not be used as the sole basis for treatment or other patient management decisions. A negative result may occur with  improper specimen collection/handling, submission of specimen other than nasopharyngeal swab, presence of viral mutation(s) within the areas targeted by this assay, and inadequate number of viral copies(<138 copies/mL). A negative result must be combined with clinical observations, patient history, and epidemiological information. The expected result is Negative.  Fact Sheet for Patients:  EntrepreneurPulse.com.au  Fact Sheet for Healthcare Providers:  IncredibleEmployment.be  This test is no t yet approved or cleared by the [place] FDA and  has been authorized for detection and/or diagnosis of SARS-CoV-2 by FDA under an Emergency Use Authorization (EUA). This EUA will remain  in effect (meaning this test can be used) for the duration of the COVID-19 declaration under Section 564(b)(1) of the Act, 21 U.S.C.section 360bbb-3(b)(1), unless the authorization is terminated  or revoked sooner.       Influenza A by PCR NEGATIVE NEGATIVE Final   Influenza B by PCR NEGATIVE NEGATIVE Final    Comment: (NOTE) The Xpert Xpress SARS-CoV-2/FLU/RSV plus assay is intended as an aid in the diagnosis of influenza from Nasopharyngeal swab specimens and should not be used as a sole basis for  treatment. Nasal washings and aspirates are unacceptable for Xpert Xpress SARS-CoV-2/FLU/RSV testing.  Fact Sheet for Patients: EntrepreneurPulse.com.au  Fact Sheet for Healthcare Providers: IncredibleEmployment.be  This test is not yet approved or cleared by the [place] FDA and has been authorized for detection and/or diagnosis of SARS-CoV-2 by  FDA under an Emergency Use Authorization (EUA). This EUA will remain in effect (meaning this test can be used) for the duration of the COVID-19 declaration under Section 564(b)(1) of the Act, 21 U.S.C. section 360bbb-3(b)(1), unless the authorization is terminated or revoked.  Performed at [facility], [place] [address], [place], [place] [address]   Culture, blood (routine x 2)     Status: None (Preliminary result)   Collection Time: 10/03/21  7:35 AM   Specimen: BLOOD RIGHT HAND  Result Value Ref Range Status   Specimen Description BLOOD RIGHT HAND  Final   Special Requests   Final    BOTTLES DRAWN AEROBIC AND ANAEROBIC Blood Culture adequate volume   Culture   Final    NO GROWTH 1 DAY Performed at [facility], [place] [address] [place], [place] [address]    Report Status PENDING  Incomplete  Culture, blood (routine x 2)     Status: None (Preliminary result)   Collection Time: 10/03/21  7:35 AM   Specimen: BLOOD RIGHT ARM  Result Value Ref Range Status   Specimen Description BLOOD RIGHT ARM  Final   Special Requests   Final    BOTTLES DRAWN AEROBIC ONLY Blood Culture results may not be optimal due to an inadequate volume of blood received in culture bottles   Culture   Final    NO GROWTH 1 DAY Performed at [facility], [place] [address], [place], [place] [address]    Report Status PENDING  Incomplete  Urine Culture     Status: None (Preliminary result)   Collection Time: 10/03/21 11:17 AM   Specimen: Urine, Clean Catch  Result Value Ref Range Status   Specimen Description  URINE, CLEAN CATCH  Final   Special Requests NONE  Final   Culture   Final    CULTURE REINCUBATED FOR BETTER GROWTH Performed at [facility], [place] [address], [place], [place] [address]    Report Status PENDING  Incomplete    Oswald Hillock   [facility] If 7PM-7AM, please contact night-coverage at [email or web address], Office  [phone]   10/04/2021, 6:43 PM  LOS: 3 days

## 2021-10-04 NOTE — Progress Notes (Signed)
Physical Therapy Treatment Patient Details Name: [name] MRN: [id number] DOB: 03/26/1932 Today's Date: 10/04/2021   History of Present Illness Pt is a 87 y.o. F who presents after a fall with right humeral neck and greater tuberosity and right pubic ramus fx. Significant PMH: aneurysm, basal cell carcinoma, HTN, left heart failure, osteoporosis, pacemaker, polymyalgia, and compression fractures.    PT Comments    Pt able to tolerate bed level exercises this session, bed mobility and transfers to standing. Requires up to two person maximal assist for functional mobility. Transfer to chair deferred due to pt having multiple episodes of bowel incontinence during session. Provided peri care and pad change. Pt displays generalized weakness, pain, decreased activity tolerance, balance deficits, and decreased functional use of RUE. This is a significant change from being typically independent at baseline. Would benefit from post acute rehab to address deficits and maximize functional mobility.     Recommendations for follow up therapy are one component of a multi-disciplinary discharge planning process, led by the attending physician.  Recommendations may be updated based on patient status, additional functional criteria and insurance authorization.  Follow Up Recommendations  Acute inpatient rehab (3hours/day)     Assistance Recommended at Discharge Frequent or constant Supervision/Assistance  Equipment Recommendations  BSC/3in1;Wheelchair (measurements PT);Wheelchair cushion (measurements PT)    Recommendations for Other Services Rehab consult     Precautions / Restrictions Precautions Precautions: Fall;Other (comment) Precaution Comments: fragile skin, multiple skin tears Required Braces or Orthoses: Sling Restrictions Weight Bearing Restrictions: Yes RUE Weight Bearing: Non weight bearing RLE Weight Bearing: Weight bearing as tolerated LLE Weight Bearing: Weight bearing as  tolerated Other Position/Activity Restrictions: "Ok to come out of sling for ADL's, but keep elbow close to body. No PROM/AROM until 2-3 weeks."     Mobility  Bed Mobility Overal bed mobility: Needs Assistance Bed Mobility: Supine to Sit;Sit to Supine;Rolling Rolling: Max assist   Supine to sit: Max assist;+2 for physical assistance Sit to supine: Max assist;+2 for physical assistance   General bed mobility comments: Cues for bending contralateral knee, reaching with L hand when rolling to R, and looking in direction to assist with rolling. Pt unable to really tolerate rolling towards R, slightly less painful with rolling to L. Assist using bed pads for supine <> sit    Transfers Overall transfer level: Needs assistance Equipment used: None Transfers: Sit to/from Stand Sit to Stand: Mod assist;+2 physical assistance           General transfer comment: ModA + 2 to rise from edge of bed, posterior lean    Ambulation/Gait                   Stairs             Wheelchair Mobility    Modified Rankin (Stroke Patients Only)       Balance Overall balance assessment: Needs assistance Sitting-balance support: Feet supported Sitting balance-Leahy Scale: Fair     Standing balance support: Single extremity supported Standing balance-Leahy Scale: Poor Standing balance comment: bracing posteriorly against bed                            Cognition Arousal/Alertness: Awake/alert Behavior During Therapy: WFL for tasks assessed/performed Overall Cognitive Status: Within Functional Limits for tasks assessed  General Comments: Remains anxious with mobility        Exercises General Exercises - Lower Extremity Ankle Circles/Pumps: Both;20 reps;Supine Quad Sets: Both;10 reps;Supine Long Arc Quad: Both;Seated;10 reps Heel Slides: AAROM;Both;10 reps;Supine    General Comments        Pertinent  Vitals/Pain Pain Assessment: Faces Faces Pain Scale: Hurts worst Pain Location: R shoulder, RLE, L flank Pain Descriptors / Indicators: Grimacing;Guarding;Sharp Pain Intervention(s): Limited activity within patient's tolerance;Monitored during session;Premedicated before session;Patient requesting pain meds-RN notified;Repositioned    Home Living                          Prior Function            PT Goals (current goals can now be found in the care plan section) Acute Rehab PT Goals Patient Stated Goal: less pain PT Goal Formulation: With patient Time For Goal Achievement: 10/17/21 Potential to Achieve Goals: Good    Frequency    Min 3X/week      PT Plan Current plan remains appropriate    Co-evaluation              AM-PAC PT "6 Clicks" Mobility   Outcome Measure  Help needed turning from your back to your side while in a flat bed without using bedrails?: Total Help needed moving from lying on your back to sitting on the side of a flat bed without using bedrails?: Total Help needed moving to and from a bed to a chair (including a wheelchair)?: Total Help needed standing up from a chair using your arms (e.g., wheelchair or bedside chair)?: Total Help needed to walk in hospital room?: Total Help needed climbing 3-5 steps with a railing? : Total 6 Click Score: 6    End of Session Equipment Utilized During Treatment: Gait belt;Other (comment) (sling) Activity Tolerance: Patient limited by pain Patient left: in bed;with call bell/phone within reach;with bed alarm set Nurse Communication: Mobility status PT Visit Diagnosis: Unsteadiness on feet (R26.81);Muscle weakness (generalized) (M62.81);Difficulty in walking, not elsewhere classified (R26.2);Pain Pain - Right/Left: Right Pain - part of body: Shoulder;Leg     Time: 0932-6712 PT Time Calculation (min) (ACUTE ONLY): 26 min  Charges:  $Therapeutic Exercise: 8-22 mins $Therapeutic Activity: 8-22  mins                     Wyona Almas, PT, DPT Acute Rehabilitation Services Pager [phone] Office [phone]    Deno Etienne 10/04/2021, 11:30 AM

## 2021-10-05 DIAGNOSIS — R54 Age-related physical debility: Secondary | ICD-10-CM

## 2021-10-05 DIAGNOSIS — S42294A Other nondisplaced fracture of upper end of right humerus, initial encounter for closed fracture: Secondary | ICD-10-CM

## 2021-10-05 LAB — COMPREHENSIVE METABOLIC PANEL
ALT: 36 U/L (ref 0–44)
AST: 48 U/L — ABNORMAL HIGH (ref 15–41)
Albumin: 1.9 g/dL — ABNORMAL LOW (ref 3.5–5.0)
Alkaline Phosphatase: 76 U/L (ref 38–126)
Anion gap: 6 (ref 5–15)
BUN: 72 mg/dL — ABNORMAL HIGH (ref 8–23)
CO2: 21 mmol/L — ABNORMAL LOW (ref 22–32)
Calcium: 7.3 mg/dL — ABNORMAL LOW (ref 8.9–10.3)
Chloride: 112 mmol/L — ABNORMAL HIGH (ref 98–111)
Creatinine, Ser: 1.59 mg/dL — ABNORMAL HIGH (ref 0.44–1.00)
GFR, Estimated: 31 mL/min — ABNORMAL LOW (ref >60)
Glucose, Bld: 118 mg/dL — ABNORMAL HIGH (ref 70–99)
Potassium: 5.1 mmol/L (ref 3.5–5.1)
Sodium: 139 mmol/L (ref 135–145)
Total Bilirubin: 0.4 mg/dL (ref 0.3–1.2)
Total Protein: 4.6 g/dL — ABNORMAL LOW (ref 6.5–8.1)

## 2021-10-05 LAB — CBC
HCT: 23.5 % — ABNORMAL LOW (ref 36.0–46.0)
Hemoglobin: 7.6 g/dL — ABNORMAL LOW (ref 12.0–15.0)
MCH: 32.1 pg (ref 26.0–34.0)
MCHC: 32.3 g/dL (ref 30.0–36.0)
MCV: 99.2 fL (ref 80.0–100.0)
Platelets: 137 10*3/uL — ABNORMAL LOW (ref 150–400)
RBC: 2.37 MIL/uL — ABNORMAL LOW (ref 3.87–5.11)
RDW: 15.9 % — ABNORMAL HIGH (ref 11.5–15.5)
WBC: 13.9 10*3/uL — ABNORMAL HIGH (ref 4.0–10.5)
nRBC: 0.1 % (ref 0.0–0.2)

## 2021-10-05 LAB — GLUCOSE, CAPILLARY
Glucose-Capillary: 100 mg/dL — ABNORMAL HIGH (ref 70–99)
Glucose-Capillary: 116 mg/dL — ABNORMAL HIGH (ref 70–99)
Glucose-Capillary: 125 mg/dL — ABNORMAL HIGH (ref 70–99)
Glucose-Capillary: 136 mg/dL — ABNORMAL HIGH (ref 70–99)
Glucose-Capillary: 137 mg/dL — ABNORMAL HIGH (ref 70–99)
Glucose-Capillary: 94 mg/dL (ref 70–99)

## 2021-10-05 LAB — URINE CULTURE: Culture: 30000 — AB

## 2021-10-05 MED ORDER — SODIUM CHLORIDE 0.9 % IV SOLN
2.0000 g | INTRAVENOUS | Status: DC
  Administered 2021-10-05: 2 g via INTRAVENOUS

## 2021-10-05 MED ORDER — OXYCODONE HCL 5 MG PO TABS
5.0000 mg | ORAL_TABLET | Freq: Four times a day (QID) | ORAL | Status: DC | PRN
  Administered 2021-10-05 – 2021-10-06 (×2): 5 mg via ORAL
  Filled 2021-10-05 (×3): qty 1

## 2021-10-05 NOTE — Progress Notes (Addendum)
Inpatient Rehabilitation Admissions Coordinator   I received call from pt's daughter, [name] and family can arrange 24/7 assist of Mom after a Cir admit. I will clarify when Cir bed available and follow up with patient, family and acute team.  Danne Baxter, RN, MSN Rehab Admissions Coordinator [phone] 10/05/2021 8:56 AM  I met with patient at bedside and spoke again with her daughter, [name], by phone. We anticipate a CIR bed for her admit on Friday.  Danne Baxter, RN, MSN Rehab Admissions Coordinator [phone] 10/05/2021 11:45 AM

## 2021-10-05 NOTE — Progress Notes (Signed)
Occupational Therapy Treatment Patient Details Name: [name] MRN: [id number] DOB: October 16, 1932 Today's Date: 10/05/2021   History of present illness Pt is a 87 y.o. F who presents after a fall with right humeral neck and greater tuberosity and right pubic ramus fx. Significant PMH: aneurysm, basal cell carcinoma, HTN, left heart failure, osteoporosis, pacemaker, polymyalgia, and compression fractures.   OT comments  Patient received in bed and agreeable to OT services. Patient had soiled bed and rolled side to side to assist with cleaning. Patient was max assist to get to eob and once on EOB was able to perform grooming tasks with LUE. Patient performed 2 stands from eob with max assist and tolerated approximately one minute of standing. Patient returned to supine with max assist. Patient has made good progress from requiring +2 to one assist for bed mobility and standing from EOB.  Acute OT to continue to follow.    Recommendations for follow up therapy are one component of a multi-disciplinary discharge planning process, led by the attending physician.  Recommendations may be updated based on patient status, additional functional criteria and insurance authorization.    Follow Up Recommendations  Acute inpatient rehab (3hours/day)    Assistance Recommended at Discharge Frequent or constant Supervision/Assistance  Equipment Recommendations  Tub/shower seat    Recommendations for Other Services      Precautions / Restrictions Precautions Precautions: Fall;Other (comment) Precaution Comments: fragile skin, multiple skin tears Required Braces or Orthoses: Sling Restrictions Weight Bearing Restrictions: Yes RUE Weight Bearing: Non weight bearing RLE Weight Bearing: Weight bearing as tolerated LLE Weight Bearing: Weight bearing as tolerated Other Position/Activity Restrictions: "Ok to come out of sling for ADL's, but keep elbow close to body. No PROM/AROM until 2-3 weeks."        Mobility Bed Mobility Overal bed mobility: Needs Assistance Bed Mobility: Supine to Sit;Sit to Supine;Rolling Rolling: Max assist   Supine to sit: Max assist;HOB elevated Sit to supine: Max assist   General bed mobility comments: rolling performed for cleaning following BM.  Bed pad used to assist with supine to sit and sit to supine    Transfers Overall transfer level: Needs assistance Equipment used: None Transfers: Sit to/from Stand Sit to Stand: Max assist           General transfer comment: sit to stands performed from eob with max assist of one     Balance Overall balance assessment: Needs assistance Sitting-balance support: Feet supported Sitting balance-Leahy Scale: Fair Sitting balance - Comments: able to perform light grooming seated on eob   Standing balance support: Single extremity supported Standing balance-Leahy Scale: Poor Standing balance comment: performed from eob                           ADL either performed or assessed with clinical judgement   ADL Overall ADL's : Needs assistance/impaired     Grooming: Minimal assistance;Sitting Grooming Details (indicate cue type and reason): washed face and combed hair sitting on eob                               General ADL Comments: assistance due to RUE limitations    Extremity/Trunk Assessment Upper Extremity Assessment Upper Extremity Assessment: RUE deficits/detail RUE Deficits / Details: humerus fx, non-op management. NWB, no PROM, sling at all times except for ADLs. limited composite flexion & extension. significant edema RUE Sensation: WNL RUE  Coordination: decreased gross motor;decreased fine motor            Vision       Perception     Praxis      Cognition Arousal/Alertness: Awake/alert Behavior During Therapy: WFL for tasks assessed/performed Overall Cognitive Status: Within Functional Limits for tasks assessed                                  General Comments: aware of RUE limitations          Exercises     Shoulder Instructions       General Comments poor skin integrity    Pertinent Vitals/ Pain       Pain Assessment: 0-10 Pain Score: 8  Faces Pain Scale: Hurts whole lot Breathing: normal Pain Location: R shoulder, RLE, L flank Pain Descriptors / Indicators: Grimacing;Guarding;Sharp Pain Intervention(s): Limited activity within patient's tolerance;Repositioned  Home Living                                          Prior Functioning/Environment              Frequency  Min 2X/week        Progress Toward Goals  OT Goals(current goals can now be found in the care plan section)  Progress towards OT goals: Progressing toward goals  Acute Rehab OT Goals Patient Stated Goal: go to rehab OT Goal Formulation: With patient Time For Goal Achievement: 10/17/21 Potential to Achieve Goals: Fair ADL Goals Pt Will Perform Upper Body Bathing: with set-up;sitting Pt Will Perform Lower Body Bathing: with min assist;sit to/from stand Pt Will Perform Upper Body Dressing: with set-up;sitting Pt Will Perform Lower Body Dressing: with min assist;sit to/from stand Pt Will Transfer to Toilet: with min guard assist;ambulating Pt Will Perform Toileting - Clothing Manipulation and hygiene: with supervision;sitting/lateral leans Pt Will Perform Tub/Shower Transfer: with min guard assist;ambulating;shower seat  Plan Discharge plan remains appropriate    Co-evaluation                 AM-PAC OT "6 Clicks" Daily Activity     Outcome Measure   Help from another person eating meals?: A Little Help from another person taking care of personal grooming?: A Little Help from another person toileting, which includes using toliet, bedpan, or urinal?: A Lot Help from another person bathing (including washing, rinsing, drying)?: A Lot Help from another person to put on and taking off regular upper  body clothing?: A Lot Help from another person to put on and taking off regular lower body clothing?: A Lot 6 Click Score: 14    End of Session Equipment Utilized During Treatment: Gait belt  OT Visit Diagnosis: Unsteadiness on feet (R26.81);Other abnormalities of gait and mobility (R26.89);Muscle weakness (generalized) (M62.81);History of falling (Z91.81);Pain   Activity Tolerance Patient limited by pain   Patient Left in bed;with call bell/phone within reach;with bed alarm set   Nurse Communication Mobility status        Time: 6222-9798 OT Time Calculation (min): 30 min  Charges: OT General Charges $OT Visit: 1 Visit OT Treatments $Self Care/Home Management : 8-22 mins $Therapeutic Activity: 8-22 mins  Lodema Hong, OTA Acute Rehabilitation Services  Pager [phone] Office [phone]   Trixie Dredge 10/05/2021, 1:08 PM

## 2021-10-05 NOTE — Plan of Care (Signed)
  Problem: Health Behavior/Discharge Planning: Goal: Ability to manage health-related needs will improve Outcome: Progressing   Problem: Activity: Goal: Risk for activity intolerance will decrease Outcome: Progressing   Problem: Nutrition: Goal: Adequate nutrition will be maintained Outcome: Progressing   Problem: Coping: Goal: Level of anxiety will decrease Outcome: Progressing   Problem: Pain Managment: Goal: General experience of comfort will improve Outcome: Progressing   Problem: Safety: Goal: Ability to remain free from injury will improve Outcome: Progressing   

## 2021-10-05 NOTE — H&P (Addendum)
Physical Medicine and Rehabilitation Admission H&P    Chief Complaint  Patient presents with  . Functional decline   . Pelvic and humerus fractures     HPI: [name] RH-female with history of HTN, chronic LBP, anemia, recent diagnosis of PAF- on eliquis, PPM, CKD, PMR-chronic steroids who was admitted on 10/01/21 with fall the day before and found to have transverse proximal humeral head fracture and right pubic rami and superior rami fractures. She was also noted to be anemic and had hyperkalemia requiring dose of Lokelma. She was started on stress dose steroids and elquis held. Right calf laceration stapled and ortho consulted for input. Dr. Stann Mainland recommended non-operative management recommended with RUE placed in sling and to be NWB--ok to doff for ADL with elbow close to body and no ROM for at least 2-3 weeks. WBAT on RLE with recs to follow up in 2-3 weeks for follow up films. Hospital course significant for brief runs of asymptomatic VT as well as acute on chronic anemia requiring one unit PRBC. Lasix held due to worsening of renal status and foley placed due to urinary retention. Cefepime added due to leucocytosis on 11/08 due to sepsis and to complete 5 day course antibiotic regimen. She as    Review of Systems  Constitutional:  Positive for malaise/fatigue. Negative for chills and fever.  HENT:  Negative for hearing loss.   Eyes:  Negative for blurred vision and double vision.  Respiratory:  Negative for cough and shortness of breath.   Cardiovascular:  Negative for chest pain.  Gastrointestinal:  Positive for constipation. Negative for abdominal pain, heartburn and nausea.  Genitourinary:        Foley in palce  Musculoskeletal:  Positive for joint pain and myalgias.  Skin:  Negative for rash.  Neurological:  Positive for dizziness and weakness.  Psychiatric/Behavioral:  The patient does not have insomnia.     Past Medical History:  Diagnosis Date  . Allergic  rhinitis   . Anemia    iron deficient  . Aneurysm ([place])   . Anxiety   . Basal cell carcinoma   . Chronic anemia   . Chronic low back pain   . Colon polyps   . GERD (gastroesophageal reflux disease)   . Hiatal hernia   . HTN (hypertension)   . Hypercholesteremia   . Hyperlipidemia   . Hypothyroidism   . Insomnia   . Left heart failure ([place])   . Osteoporosis    S/P forteo treatment as of 02/2009  . Pacemaker   . Polymyalgia ([place])    rheumatica-steroids per Rheum  . Systolic heart failure     Past Surgical History:  Procedure Laterality Date  . ABDOMINAL HYSTERECTOMY    . ANGIOPLASTY    . BACK SURGERY    . CARDIAC CATHETERIZATION  10/16/06  . CATARACT EXTRACTION    . defibulator implant    . EP IMPLANTABLE DEVICE N/A 04/10/2016   Procedure: BiV Pacemaker Insertion CRT-P;  Surgeon: Evans Lance, MD;  Location: [facility];  Service: Cardiovascular;  Laterality: N/A;  . FOOT SURGERY    . MITRAL VALVE REPAIR    . skin cancer removal    . THYROIDECTOMY    . TONSILLECTOMY      Family History  Problem Relation Age of Onset  . Stroke Mother        in her 44's  . Cancer Father 73       secondary  .  Colon cancer Brother   . Colon cancer Sister   . Breast cancer Sister     Social History: Lives alone and independent without AD. She reports that she has never smoked. She has never used smokeless tobacco. She reports that she does not drink alcohol and does not use drugs.   Allergies  Allergen Reactions  . Pertussis Vaccines Swelling  . Sulfa Antibiotics Nausea Only  . Amoxicillin Rash and Other (See Comments)    Has patient had a PCN reaction causing immediate rash, facial/tongue/throat swelling, SOB or lightheadedness with hypotension: yes Has patient had a PCN reaction causing severe rash involving mucus membranes or skin necrosis: no Has patient had a PCN reaction that required hospitalization no Has patient had a PCN reaction occurring within the last 10  years: yes If all of the above answers are "NO", then may proceed with Cephalosporin use.    Medications Prior to Admission  Medication Sig Dispense Refill  . allopurinol (ZYLOPRIM) 100 MG tablet Take 100 mg by mouth daily.    Marland Kitchen amLODipine (NORVASC) 2.5 MG tablet Take 2.5 mg by mouth daily.    Marland Kitchen apixaban (ELIQUIS) 2.5 MG TABS tablet Take 1 tablet (2.5 mg total) by mouth 2 (two) times daily. 180 tablet 3  . aspirin EC 81 MG tablet Take 81 mg by mouth daily. Swallow whole.    Marland Kitchen atorvastatin (LIPITOR) 40 MG tablet Take 40 mg by mouth daily.    . Cholecalciferol (VITAMIN D3) 1000 UNITS CAPS Take 1,000 Units by mouth daily.     . furosemide (LASIX) 40 MG tablet Take 20-40 tablets by mouth in the morning and at bedtime. Take 1 tablet in the morning and 1/2 tablet in the evening.    Marland Kitchen HYDROcodone-acetaminophen (NORCO/VICODIN) 5-325 MG per tablet Take 1 tablet by mouth every 6 (six) hours as needed (For pain.).   0  . levothyroxine (SYNTHROID, LEVOTHROID) 75 MCG tablet Take 1 tablet (75 mcg total) by mouth daily. 90 tablet 1  . lisinopril (ZESTRIL) 10 MG tablet Take 10 mg by mouth daily.    . metoprolol succinate (TOPROL-XL) 100 MG 24 hr tablet Take 100 mg by mouth daily.    Marland Kitchen omeprazole (PRILOSEC OTC) 20 MG tablet Take 20 mg by mouth 2 (two) times daily as needed (acid reflux).    . potassium chloride SA (KLOR-CON) 20 MEQ tablet Take 10-20 tablets by mouth in the morning and at bedtime. Take 1 tablet in the morning and 1/2 tablet in the evening    . carvedilol (COREG) 25 MG tablet Take 1 tablet (25 mg total) by mouth 2 (two) times daily. (Patient not taking: Reported on 10/01/2021) 180 tablet 3  . lisinopril (ZESTRIL) 20 MG tablet Take 1 tablet (20 mg total) by mouth daily. (Patient not taking: Reported on 10/01/2021) 90 tablet 3    Drug Regimen Review  Drug regimen was reviewed and remains appropriate with no significant issues identified  Home: Home Living Family/patient expects to be discharged  to:: Private residence Living Arrangements: Alone Available Help at Discharge: Family, Available 24 hours/day (2 local daughters to provide 24/7 assist) Type of Home: House Home Access: Stairs to enter [employer] of Steps: 1 Entrance Stairs-Rails: Left Home Layout: One level Bathroom Shower/Tub: Multimedia programmer: Standard Bathroom Accessibility: Yes Home Equipment: Conservation officer, nature (2 wheels)  Lives With: Alone   Functional History: Prior Function Prior Level of Function : Independent/Modified Independent Mobility Comments: no AD ADLs Comments: drives  Functional Status:  Mobility: Bed Mobility Overal bed mobility: Needs Assistance Bed Mobility: Supine to Sit, Sit to Supine, Rolling Rolling: Max assist Supine to sit: Max assist, HOB elevated Sit to supine: Max assist General bed mobility comments: rolling performed for cleaning following BM.  Bed pad used to assist with supine to sit and sit to supine Transfers Overall transfer level: Needs assistance Equipment used: None Transfers: Sit to/from Stand Sit to Stand: Max assist General transfer comment: sit to stands performed from eob with max assist of one      ADL: ADL Overall ADL's : Needs assistance/impaired Eating/Feeding: Set up, Sitting Grooming: Minimal assistance, Sitting Grooming Details (indicate cue type and reason): washed face and combed hair sitting on eob Upper Body Bathing: Moderate assistance, Sitting Lower Body Bathing: Maximal assistance, +2 for physical assistance, Sit to/from stand Upper Body Dressing : Maximal assistance, Sitting Lower Body Dressing: Maximal assistance, +2 for physical assistance, Sit to/from stand Toilet Transfer: Maximal assistance, +2 for physical assistance, Stand-pivot Toileting- Clothing Manipulation and Hygiene: Maximal assistance, +2 for physical assistance, Sit to/from stand Functional mobility during ADLs: Maximal assistance, +2 for physical  assistance General ADL Comments: assistance due to RUE limitations  Cognition: Cognition Overall Cognitive Status: Within Functional Limits for tasks assessed Orientation Level: Oriented X4 Cognition Arousal/Alertness: Awake/alert Behavior During Therapy: WFL for tasks assessed/performed Overall Cognitive Status: Within Functional Limits for tasks assessed General Comments: aware of RUE limitations   Blood pressure (!) 161/55, pulse 96, temperature 98.2 F (36.8 C), temperature source Oral, resp. rate 16, height 4\' 8"  (1.422 m), weight 50.9 kg, SpO2 94 %. Physical Exam Constitutional:      General: She is in acute distress.     Appearance: She is ill-appearing.     Comments: Diffuse facial ecchymosis with hematoma right eyebrow. Frail, fatigued appearing with reports of pain all over and wanted to rest. Sling RUE.   HENT:     Head:     Comments: Face with multiple bruises and lacs. Large hematoma over right eye/right frontal area.      Right Ear: External ear normal.     Left Ear: External ear normal.     Mouth/Throat:     Mouth: Mucous membranes are moist.  Eyes:     Extraocular Movements: Extraocular movements intact.     Pupils: Pupils are equal, round, and reactive to light.  Neck:     Comments: bruising Cardiovascular:     Rate and Rhythm: Normal rate and regular rhythm.     Heart sounds: No murmur heard.   No gallop.  Pulmonary:     Effort: Pulmonary effort is normal. No respiratory distress.     Breath sounds: No wheezing.  Abdominal:     General: Bowel sounds are normal. There is no distension.     Tenderness: There is no abdominal tenderness.  Musculoskeletal:        General: Swelling and tenderness (RUE and pelvis) present.  Skin:    Comments: Bruising RUE, bilateral LE's. Both legs with dressings in place midway.   Neurological:     Mental Status: She is oriented to person, place, and time.     Cranial Nerves: No cranial nerve deficit.     Sensory: No  sensory deficit.     Comments: Speech clear. Fair insight and awareness. Follows basic commands. Normal language. Motor limited by pain RUE and bilateral LE's. LUE grossly 3-4/5. LE's 2/5 prox to 4/5 distally.   Psychiatric:     Comments: Pt anxious, cooperative.  Mostly just uncomfortable because of her pain    Results for orders placed or performed during the hospital encounter of 10/01/21 (from the past 48 hour(s))  Glucose, capillary     Status: Abnormal   Collection Time: 10/04/21 11:39 AM  Result Value Ref Range   Glucose-Capillary 102 (H) 70 - 99 mg/dL    Comment: Glucose reference range applies only to samples taken after fasting for at least 8 hours.  Glucose, capillary     Status: Abnormal   Collection Time: 10/04/21  4:05 PM  Result Value Ref Range   Glucose-Capillary 162 (H) 70 - 99 mg/dL    Comment: Glucose reference range applies only to samples taken after fasting for at least 8 hours.  Glucose, capillary     Status: Abnormal   Collection Time: 10/04/21  8:09 PM  Result Value Ref Range   Glucose-Capillary 144 (H) 70 - 99 mg/dL    Comment: Glucose reference range applies only to samples taken after fasting for at least 8 hours.  Glucose, capillary     Status: Abnormal   Collection Time: 10/05/21 12:05 AM  Result Value Ref Range   Glucose-Capillary 137 (H) 70 - 99 mg/dL    Comment: Glucose reference range applies only to samples taken after fasting for at least 8 hours.  CBC     Status: Abnormal   Collection Time: 10/05/21  3:53 AM  Result Value Ref Range   WBC 13.9 (H) 4.0 - 10.5 K/uL   RBC 2.37 (L) 3.87 - 5.11 MIL/uL   Hemoglobin 7.6 (L) 12.0 - 15.0 g/dL   HCT 23.5 (L) 36.0 - 46.0 %   MCV 99.2 80.0 - 100.0 fL   MCH 32.1 26.0 - 34.0 pg   MCHC 32.3 30.0 - 36.0 g/dL   RDW 15.9 (H) 11.5 - 15.5 %   Platelets 137 (L) 150 - 400 K/uL   nRBC 0.1 0.0 - 0.2 %    Comment: Performed at [facility] [address], [place], [place] [address]  Comprehensive metabolic  panel     Status: Abnormal   Collection Time: 10/05/21  3:53 AM  Result Value Ref Range   Sodium 139 135 - 145 mmol/L   Potassium 5.1 3.5 - 5.1 mmol/L   Chloride 112 (H) 98 - 111 mmol/L   CO2 21 (L) 22 - 32 mmol/L   Glucose, Bld 118 (H) 70 - 99 mg/dL    Comment: Glucose reference range applies only to samples taken after fasting for at least 8 hours.   BUN 72 (H) 8 - 23 mg/dL   Creatinine, Ser 1.59 (H) 0.44 - 1.00 mg/dL   Calcium 7.3 (L) 8.9 - 10.3 mg/dL   Total Protein 4.6 (L) 6.5 - 8.1 g/dL   Albumin 1.9 (L) 3.5 - 5.0 g/dL   AST 48 (H) 15 - 41 U/L   ALT 36 0 - 44 U/L   Alkaline Phosphatase 76 38 - 126 U/L   Total Bilirubin 0.4 0.3 - 1.2 mg/dL   GFR, Estimated 31 (L) >60 mL/min    Comment: (NOTE) Calculated using the CKD-EPI Creatinine Equation (2021)    Anion gap 6 5 - 15    Comment: Performed at [facility], [place] [address] [place], [place] [address]  Glucose, capillary     Status: Abnormal   Collection Time: 10/05/21  3:53 AM  Result Value Ref Range   Glucose-Capillary 100 (H) 70 - 99 mg/dL    Comment: Glucose  reference range applies only to samples taken after fasting for at least 8 hours.  Glucose, capillary     Status: None   Collection Time: 10/05/21  8:14 AM  Result Value Ref Range   Glucose-Capillary 94 70 - 99 mg/dL    Comment: Glucose reference range applies only to samples taken after fasting for at least 8 hours.  Glucose, capillary     Status: Abnormal   Collection Time: 10/05/21 11:15 AM  Result Value Ref Range   Glucose-Capillary 116 (H) 70 - 99 mg/dL    Comment: Glucose reference range applies only to samples taken after fasting for at least 8 hours.  Glucose, capillary     Status: Abnormal   Collection Time: 10/05/21  4:08 PM  Result Value Ref Range   Glucose-Capillary 136 (H) 70 - 99 mg/dL    Comment: Glucose reference range applies only to samples taken after fasting for at least 8 hours.  Glucose, capillary     Status: Abnormal   Collection  Time: 10/05/21  8:22 PM  Result Value Ref Range   Glucose-Capillary 125 (H) 70 - 99 mg/dL    Comment: Glucose reference range applies only to samples taken after fasting for at least 8 hours.  Glucose, capillary     Status: Abnormal   Collection Time: 10/06/21 12:10 AM  Result Value Ref Range   Glucose-Capillary 102 (H) 70 - 99 mg/dL    Comment: Glucose reference range applies only to samples taken after fasting for at least 8 hours.  Glucose, capillary     Status: None   Collection Time: 10/06/21  4:34 AM  Result Value Ref Range   Glucose-Capillary 79 70 - 99 mg/dL    Comment: Glucose reference range applies only to samples taken after fasting for at least 8 hours.  Glucose, capillary     Status: None   Collection Time: 10/06/21  8:15 AM  Result Value Ref Range   Glucose-Capillary 80 70 - 99 mg/dL    Comment: Glucose reference range applies only to samples taken after fasting for at least 8 hours.  Glucose, capillary     Status: Abnormal   Collection Time: 10/06/21 11:16 AM  Result Value Ref Range   Glucose-Capillary 112 (H) 70 - 99 mg/dL    Comment: Glucose reference range applies only to samples taken after fasting for at least 8 hours.   US RENAL  Result Date: 10/04/2021 CLINICAL DATA:  Acute kidney injury EXAM: RENAL / URINARY TRACT ULTRASOUND COMPLETE COMPARISON:  CT 10/01/2021 FINDINGS: Right Kidney: Renal measurements: 7.9 x 4.6 x 4.1 cm = volume: 61 mL. Cortex is echogenic. Diffuse cortical thinning consistent with atrophy. No hydronephrosis or mass. Left Kidney: Renal measurements: 9.5 x 4.8 x 4.5 cm = volume: 107.4 mL. Cortex slightly echogenic. Diffuse cortical thinning. No mass or hydronephrosis Bladder: Decompressed by Foley catheter Other: None. IMPRESSION: 1. Slightly echogenic kidneys with diffuse cortical thinning/atrophy consistent with medical renal disease. 2. Negative for hydronephrosis Electronically Signed   By: Donavan Foil M.D.   On: 10/04/2021 16:52        Medical Problem List and Plan: 1.  Functional deficits secondary to polytrauma including right proximal humerus fx, pelvic fx's  -patient may shower  -ELOS/Goals: 10-12 days, supervision to min assist goals 2.  Antithrombotics: -DVT/anticoagulation:  Pharmaceutical: Lovenox--Eliquis has been on hold  -antiplatelet therapy: N/A 3. Chronic LBP/compression Fx/Pain Management:  Was on hydrocodone prn PTA. --Oxycodone prn--discontinue IV dilaudid.  -add scheduled robaxin as well,  observe for excessive sedation 4. Mood: LCSW to follow for evaluation and support.   -antipsychotic agents: N/A 5. Neuropsych: This patient is capable of making decisions on her own behalf. 6. Skin/Wound Care: Monitor wound for healing. Routine pressure relief measures.  7. Fluids/Electrolytes/Nutrition: Strict I/O. Check CMET in am.  8. Right humerus fracture: NWB RUE with sling at all times --may doff for ADL 9. Pelvic Fx: WBAT BLE 10. Acute on chronic renal failure with hyperkalemia: 11. Polymyalgia rheumatica: Followed by [facility] on medrol 2mg /day PTA.  --On Medrol 4 mg/day since admission 12. PAF: Monitor HR TID--Monitor HR TID. Continue coreg bid.  13. Leucocytosis: Monitor for signs of infection. Likely steroid effect.  14. Rectal pain/Constipation: Miralax added to Senna today.   --likely needs disimpaction as reporting rectal spasms.   -also related to fractures themselves and associated skeletal muscle spasms. 15. Hypothyroid  On supplement  16. Urinary retention: Will keep foley for a few more days until constipation addressed.   Bary Leriche, PA-C 10/06/2021

## 2021-10-05 NOTE — PMR Pre-admission (Signed)
PMR Admission Coordinator Pre-Admission Assessment  Patient: [name] is an 87 y.o., female MRN: [id number] DOB: [birth year] Height: $RemoveBeforeDE'4\' 8"'[id number]$  (142.2 cm)Weight: 50.9 kg  Insurance Information HMO:     PPO:      PCP:      IPA:      80/20:      OTHER:  PRIMARY: Medicare a and b      Policy#: [id number]      Subscriber: pt Benefits:  Phone #: passport one source     Name: 11/10 Eff. Date: a 01/25/1999 and b 07/28/1999     Deduct: $1556      Out of Pocket Max: none      Life Max: none CIR: 100%      SNF: 20 full days Outpatient: 80%     Co-Pay: 20% Home Health: 100%      Co-Pay: none DME: 80%     Co-Pay: 20% Providers: pt choice  SECONDARY: [employer]      Policy#: [id number]       Financial Counselor:       Phone#:   The "Data Collection Information Summary" for patients in Inpatient Rehabilitation Facilities with attached "Privacy Act [place] Records" was provided and verbally reviewed with: Patient and Family  Emergency Contact Information Contact Information     Name Relation Home Work Mobile   Smith,[name] Daughter [phone]  [phone]   Mare Ferrari Daughter [phone]        Current Medical History  Patient Admitting Diagnosis: polytrauma  History of Present Illness: 87 year old female with medical history significant of PAF on Eliquis started recently, nonischemic cardiomyopathy, mitral stenosis post repair, SSS status post PPM HTN, CKD stage III, chronic iron deficient anemia, polymyalgia rheumatica on chronic steroids, chronic back pain on narcotics and sees Dr Brien Few who presented on 10/01/2021 with question of syncope/near syncope and fall. Fell at home while setting up home decorations .   Imaging showed transverse fracture of proximal humeral neck and fracture greater tuberosity. Sent home with outpatient follow up with ortho., She was re scanned and found to have a right pubic ramus as well as superior pubic ramus fracture. K was 5.8  and Hgb 6.8.  Anemia felt due to multiple hematomas including right forehead, right arm FX, pelvic FX and right leg wound. Transfused and to monitor. Eliquis on hold. Started on heparin for DVT prophylaxis. Chronic diastolic CHF and is euvolemic. Lasix on hold. Leukocytosis with pro calcitonin elevated. Patient empirically started on Cefepime. CXR negative, UA clear and blood cultures penidng. Creat up to 1.96 with Lasix held. HTN and Coreg restarted at lower dose and amlodipine. Continue to hole ACE inhibitor. Urinary retention so Foley placed. Right calf skin ulceration with WOC consulted. PAF, Coreg low dose started, Eliquis on hold. Hypothyroidism on Synthroid. Polymyalgia rheumatica on Medrol as stress does. Continue Protonix.  Patient[facility]  has been reviewed by the rehabilitation admission coordinator and physician.  Past Medical History  Past Medical History:  Diagnosis Date   Allergic rhinitis    Anemia    iron deficient   Aneurysm (HCC)    Anxiety    Basal cell carcinoma    Chronic anemia    Chronic low back pain    Colon polyps    GERD (gastroesophageal reflux disease)    Hiatal hernia    HTN (hypertension)    Hypercholesteremia    Hyperlipidemia    Hypothyroidism    Insomnia  Left heart failure (HCC)    Osteoporosis    S/P forteo treatment as of 02/2009   Pacemaker    Polymyalgia ([place])    rheumatica-steroids per Rheum   Systolic heart failure     Has the patient had major surgery during 100 days prior to admission? No  Family History   family history includes Breast cancer in her sister; Cancer (age of onset: 21) in her father; Colon cancer in her brother and sister; Stroke in her mother.  Current Medications  Current Facility-Administered Medications:    allopurinol (ZYLOPRIM) tablet 100 mg, 100 mg, Oral, Daily, [place], Ping T, MD, 100 mg at 10/06/21 0941   amLODipine (NORVASC) tablet 2.5 mg, 2.5 mg, Oral, Daily, Darrick Meigs, Gagan  S, MD, 2.5 mg at 10/06/21 0941   atorvastatin (LIPITOR) tablet 40 mg, 40 mg, Oral, Daily, Wynetta Fines T, MD, 40 mg at 10/06/21 0941   bisacodyl (DULCOLAX) EC tablet 5 mg, 5 mg, Oral, Daily PRN, Wynetta Fines T, MD, 5 mg at 10/06/21 0944   carvedilol (COREG) tablet 6.25 mg, 6.25 mg, Oral, BID WC, Darrick Meigs, Gagan S, MD, 6.25 mg at 10/06/21 0940   ceFEPIme (MAXIPIME) 2 g in sodium chloride 0.9 % 100 mL IVPB, 2 g, Intravenous, Q24H, Darrick Meigs, Gagan S, MD, Last Rate: 200 mL/hr at 10/05/21 2136, 2 g at 10/05/21 2136   Chlorhexidine Gluconate Cloth 2 % PADS 6 each, 6 each, Topical, Daily, Elgergawy, Silver Huguenin, MD, 6 each at 10/05/21 1427   dextrose 50 % solution 50 mL, 1 ampule, Intravenous, Q1H PRN, Howerter, Justin B, DO   feeding supplement (ENSURE ENLIVE / ENSURE PLUS) liquid 237 mL, 237 mL, Oral, TID BM, Elgergawy, Silver Huguenin, MD, 237 mL at 10/06/21 0946   heparin injection 5,000 Units, 5,000 Units, Subcutaneous, Q12H, Wynetta Fines T, MD, 5,000 Units at 10/06/21 0951   hydrALAZINE (APRESOLINE) tablet 25 mg, 25 mg, Oral, Q6H PRN, Wynetta Fines T, MD, 25 mg at 10/05/21 2334   HYDROmorphone (DILAUDID) injection 0.5 mg, 0.5 mg, Intravenous, Q4H PRN, Wynetta Fines T, MD, 0.5 mg at 10/06/21 0973   levothyroxine (SYNTHROID) tablet 75 mcg, 75 mcg, Oral, Daily, Wynetta Fines T, MD, 75 mcg at 10/06/21 5329   methylPREDNISolone (MEDROL) tablet 4 mg, 4 mg, Oral, Daily, Wynetta Fines T, MD, 4 mg at 10/06/21 0940   metoprolol tartrate (LOPRESSOR) tablet 12.5 mg, 12.5 mg, Oral, Q6H PRN, Wynetta Fines T, MD   multivitamin with minerals tablet 1 tablet, 1 tablet, Oral, Daily, Elgergawy, Silver Huguenin, MD, 1 tablet at 10/06/21 0951   ondansetron (ZOFRAN) injection 4 mg, 4 mg, Intravenous, Q6H PRN, Wynetta Fines T, MD, 4 mg at 10/05/21 2333   oxyCODONE (Oxy IR/ROXICODONE) immediate release tablet 5 mg, 5 mg, Oral, Q6H PRN, Darrick Meigs, Gagan S, MD, 5 mg at 10/05/21 1704   pantoprazole (PROTONIX) EC tablet 40 mg, 40 mg, Oral, Daily, Heloise Purpura, RPH,  40 mg at 10/06/21 0941   polyethylene glycol (MIRALAX / GLYCOLAX) packet 17 g, 17 g, Oral, Daily, Darrick Meigs, Marge Duncans, MD   senna-docusate (Senokot-S) tablet 1 tablet, 1 tablet, Oral, QHS PRN, Lequita Halt, MD   senna-docusate (Senokot-S) tablet 2 tablet, 2 tablet, Oral, BID, Elgergawy, Silver Huguenin, MD, 2 tablet at 10/06/21 0941  Patients Current Diet:  Diet Order             Diet - low sodium heart healthy           DIET SOFT Room service appropriate? Yes; Fluid consistency:  Thin  Diet effective now                   Precautions / Restrictions Precautions Precautions: Fall, Other (comment) Precaution Comments: fragile skin, multiple skin tears Restrictions Weight Bearing Restrictions: Yes RUE Weight Bearing: Non weight bearing RLE Weight Bearing: Weight bearing as tolerated LLE Weight Bearing: Weight bearing as tolerated Other Position/Activity Restrictions: "Ok to come out of sling for ADL's, but keep elbow close to body. No PROM/AROM until 2-3 weeks."   Has the patient had 2 or more falls or a fall with injury in the past year? Yes  Prior Activity Level Community (5-7x/wk): independent and living alone; drives short distances only  Prior Functional Level Self Care: Did the patient need help bathing, dressing, using the toilet or eating? Independent  Indoor Mobility: Did the patient need assistance with walking from room to room (with or without device)? Independent  Stairs: Did the patient need assistance with internal or external stairs (with or without device)? Independent  Functional Cognition: Did the patient need help planning regular tasks such as shopping or remembering to take medications? Independent  Patient Information Are you of Hispanic, Latino/a,or Spanish origin?: A. No, not of Hispanic, Latino/a, or Spanish origin What is your race?: A. White Do you need or want an interpreter to communicate with a doctor or health care staff?: 0. No  Patient's Response  To:  Health Literacy and Transportation Is the patient able to respond to health literacy and transportation needs?: Yes Health Literacy - How often do you need to have someone help you when you read instructions, pamphlets, or other written material from your doctor or pharmacy?: Never In the past 12 months, has lack of transportation kept you from medical appointments or from getting medications?: No In the past 12 months, has lack of transportation kept you from meetings, work, or from getting things needed for daily living?: No  Development worker, international aid / [place] Devices/Equipment: None Home Equipment: Conservation officer, nature (2 wheels)  Prior Device Use: Indicate devices/aids used by the patient prior to current illness, exacerbation or injury?  Cane at times  Current Functional Level Cognition  Overall Cognitive Status: Within Functional Limits for tasks assessed Orientation Level: Oriented X4 General Comments: aware of RUE limitations    Extremity Assessment (includes Sensation/Coordination)  Upper Extremity Assessment: RUE deficits/detail RUE Deficits / Details: humerus fx, non-op management. NWB, no PROM, sling at all times except for ADLs. limited composite flexion & extension. significant edema RUE: Unable to fully assess due to pain, Unable to fully assess due to immobilization RUE Sensation: WNL RUE Coordination: decreased gross motor, decreased fine motor  Lower Extremity Assessment: Generalized weakness, RLE deficits/detail, LLE deficits/detail RLE Deficits / Details: Increased edema, pt able to perform LAQ, ankle dorsiflexion 3/5 LLE Deficits / Details: Increased edema. Pt able to perform limited heel slide and SLR. Ankle dorsiflexion 2/5    ADLs  Overall ADL's : Needs assistance/impaired Eating/Feeding: Set up, Sitting Grooming: Minimal assistance, Sitting Grooming Details (indicate cue type and reason): washed face and combed hair sitting on eob Upper Body  Bathing: Moderate assistance, Sitting Lower Body Bathing: Maximal assistance, +2 for physical assistance, Sit to/from stand Upper Body Dressing : Maximal assistance, Sitting Lower Body Dressing: Maximal assistance, +2 for physical assistance, Sit to/from stand Toilet Transfer: Maximal assistance, +2 for physical assistance, Stand-pivot Toileting- Clothing Manipulation and Hygiene: Maximal assistance, +2 for physical assistance, Sit to/from stand Functional mobility during ADLs: Maximal assistance, +2 for  physical assistance General ADL Comments: assistance due to RUE limitations    Mobility  Overal bed mobility: Needs Assistance Bed Mobility: Supine to Sit, Sit to Supine, Rolling Rolling: Max assist Supine to sit: Max assist, HOB elevated Sit to supine: Max assist General bed mobility comments: rolling performed for cleaning following BM.  Bed pad used to assist with supine to sit and sit to supine    Transfers  Overall transfer level: Needs assistance Equipment used: None Transfers: Sit to/from Stand Sit to Stand: Max assist General transfer comment: sit to stands performed from eob with max assist of one    Ambulation / Gait / Stairs / Wheelchair Mobility       Posture / Balance Dynamic Sitting Balance Sitting balance - Comments: able to perform light grooming seated on eob Balance Overall balance assessment: Needs assistance Sitting-balance support: Feet supported Sitting balance-Leahy Scale: Fair Sitting balance - Comments: able to perform light grooming seated on eob Standing balance support: Single extremity supported Standing balance-Leahy Scale: Poor Standing balance comment: performed from eob    Special needs/care consideration Wound right LE Urethral catheter 16 FR placed 11/8   Previous Home Environment  Living Arrangements: Alone  Lives With: Alone Available Help at Discharge: Family, Available 24 hours/day (2 local daughters to provide 24/7 assist) Type of  Home: House Home Layout: One level Home Access: Stairs to enter Entrance Stairs-Rails: Left Entrance Stairs-Number of Steps: 1 Bathroom Shower/Tub: Multimedia programmer: Standard Bathroom Accessibility: Yes How Accessible: Accessible via walker [place]: No  Discharge Living Setting Plans for Discharge Living Setting: Lives with (comment) (to go stay with local daughter, [name]) Type of Home at Discharge: House Discharge Home Layout: One level Discharge Home Access: Stairs to enter Entrance Stairs-Rails: Right, Left, Can reach both Entrance Stairs-Number of Steps: 3 to 4 Discharge Bathroom Shower/Tub: Tub/shower unit Discharge Bathroom Toilet: Standard Discharge Bathroom Accessibility: Yes How Accessible: Accessible via walker Does the patient have any problems obtaining your medications?: No  Social/Family/Support Systems Patient Roles: Parent Contact Information: daughter [name] and daughter, [name] Anticipated Caregiver: [name] at her home and [place] coming to assist after work Anticipated Ambulance person Information: see contacts Ability/Limitations of Caregiver: [name] works at [employer] during the day, [employer] uses a [employer] for knee issues Caregiver Availability: 24/7 Discharge Plan Discussed with Primary Caregiver: Yes Is Caregiver In Agreement with Plan?: Yes Does Caregiver/Family have Issues with Lodging/Transportation while Pt is in Rehab?: No  Goals Patient/Family Goal for Rehab: supervision to min assist with PT and OT Expected length of stay: ELOS 10 to 12 days Pt/Family Agrees to Admission and willing to participate: Yes Program Orientation Provided & Reviewed with Pt/Caregiver Including Roles  & Responsibilities: Yes  Decrease burden of Care through IP rehab admission: n/a  Possible need for SNF placement upon discharge: not anticipated  Patient Condition: I have reviewed medical records from [facility] , spoken with CM, and  patient and daughter. I met with patient at the bedside for inpatient rehabilitation assessment.  Patient will benefit from ongoing PT and OT, can actively participate in 3 hours of therapy a day 5 days of the week, and can make measurable gains during the admission.  Patient will also benefit from the coordinated team approach during an Inpatient Acute Rehabilitation admission.  The patient will receive intensive therapy as well as Rehabilitation physician, nursing, social worker, and care management interventions.  Due to bladder management, bowel management, safety, skin/wound care, disease management, medication administration,  pain management, and patient education the patient requires 24 hour a day rehabilitation nursing.  The patient is currently mod assist with mobility and basic ADLs.  Discharge setting and therapy post discharge at home with home health is anticipated.  Patient has agreed to participate in the Acute Inpatient Rehabilitation Program and will admit today.  Preadmission Screen Completed By:  Cleatrice Burke, 10/06/2021 10:46 AM ______________________________________________________________________   Discussed status with Dr. Naaman Plummer on  10/06/2021 at 1050 and received approval for admission today.  Admission Coordinator:  Cleatrice Burke, RN, time  1050 Date  10/06/2021   Assessment/Plan: Diagnosis: polytrauma Does the need for close, 24 hr/day Medical supervision in concert with the patient's rehab needs make it unreasonable for this patient to be served in a less intensive setting? Yes Co-Morbidities requiring supervision/potential complications: PAF, NICM, SSS, CKD III, PMR Due to bladder management, bowel management, safety, skin/wound care, disease management, medication administration, pain management, and patient education, does the patient require 24 hr/day rehab nursing? Yes Does the patient require coordinated care of a physician, rehab nurse, PT, OT to  address physical and functional deficits in the context of the above medical diagnosis(es)? Yes Addressing deficits in the following areas: balance, endurance, locomotion, strength, transferring, bowel/bladder control, bathing, dressing, feeding, grooming, toileting, and psychosocial support Can the patient actively participate in an intensive therapy program of at least 3 hrs of therapy 5 days a week? Yes The potential for patient to make measurable gains while on inpatient rehab is excellent Anticipated functional outcomes upon discharge from inpatient rehab: supervision and min assist PT, supervision and min assist OT, n/a SLP Estimated rehab length of stay to reach the above functional goals is: 10-12 days Anticipated discharge destination: Home 10. Overall Rehab/Functional Prognosis: excellent   MD Signature: Meredith Staggers, MD, [place] 10/06/2021

## 2021-10-05 NOTE — Progress Notes (Signed)
[facility]  PROGRESS NOTE  [name] OXB:[id number] DOB: 05-Nov-1932 DOA: 10/01/2021 PCP: Lawerance Cruel, MD   Brief HPI:    87 year old female with medical history of PAF on Eliquis started recently, nonischemic cardiomyopathy s/p CRT-D, on Lasix, mitral stenosis status postrepair, AAA s/p PPM, hypertension, CKD stage III, chronic iron deficiency anemia, polymyalgia rheumatica on chronic steroid, chronic back pain on narcotics, presented with fall likely in setting of mechanical fall In the ED, multiple scan was done which showed transverse fracture of proximal humeral neck and fracture greater tuberosity.  Patient was scheduled to see orthopedic surgery this coming Tuesday for right arm fracture.  Patient was reassured and sent home. -Overnight patient felt that pain was not controlled so she came to ED. Patient was rescanned in the ED and was found to have right pubic ramus as well as superior pubic ramus fracture.  Potassium was 5.8 hemoglobin was 6.8 so she was  admitted for further work-up.    Subjective   Patient seen and examined, urine culture growing 30,000 colonies per mL of Enterococcus faecalis   Assessment/Plan:    Acute blood loss anemia -Secondary to multiple hematomas menorrhagia body including right forehead, right arm fracture, pelvic fracture right leg wound -S/p 1 unit PRBC -Hemoglobin is 7.6, will transfuse for hemoglobin less than 7 -FOBT is negative -Eliquis on hold -Started on heparin for DVT prophylaxis  Right humeral neck fracture, right pubic ramus and right supra pubis fracture -Admitting physician D/W on call ortho Dr. Victorino December, both the right arm and right hip fractures are non-operatable and command right arm sling 3 weeks and right hip weightbearing as tolerated and physical therapy. -Orthopedic consult greatly appreciated, she was seen by Silvestre Gunner, regarding further recommendation for ambulatory status with PT -She may be  weightbearing as tolerated to the lower extremities nonweightbearing to the upper extremity.  UTI -Urine culture growing 30,000 colonies/ml of Enterococcus faecalis -Patient was empirically started on cefepime -We will discontinue cefepime and start vancomycin  Status post fall -Cannot recall exact events but unlikely that patient had syncope or presyncope -She thinks she tripped on the rug -PPM interrogation -2D echo was done which showed no significant valvular disease, preserved EF  Chronic diastolic CHF -Euvolemic -Lasix on hold  Acute kidney injury on CKD stage IIIb -Creatinine was elevated to 1.96, improved to 1.59 today  -Lasix has been held -ACE inhibitor's are on hold -Renal ultrasound shows no hydronephrosis -Follow renal function in a.m.  Hypertension -Blood pressure is mildly elevated -Restart Coreg at lower dose of 6.25 mg p.o. twice daily, amlodipine 2.5 mg daily -Continue to hold ACE inhibitor  Urinary retention -Unclear etiology, Foley catheter inserted -Renal ultrasound ordered  Right calf skin ulceration -Wound care consulted  Paroxysmal atrial fibrillation -Coreg low-dose started as above -Eliquis on hold due to bleeding as above  Hypothyroidism -Continue Synthroid  Polymyalgia rheumatica -Patient takes Medrol 2 mg daily at home -Medrol dose has been changed to 4 mg daily for stress dose -Continue Protonix 40 mg daily      Scheduled medications:    allopurinol  100 mg Oral Daily   amLODipine  2.5 mg Oral Daily   atorvastatin  40 mg Oral Daily   carvedilol  6.25 mg Oral BID WC   Chlorhexidine Gluconate Cloth  6 each Topical Daily   feeding supplement  237 mL Oral TID BM   heparin  5,000 Units Subcutaneous Q12H   levothyroxine  75 mcg Oral Daily  methylPREDNISolone  4 mg Oral Daily   multivitamin with minerals  1 tablet Oral Daily   pantoprazole  40 mg Oral Daily   senna-docusate  2 tablet Oral BID     Data Reviewed:    CBG:  Recent Labs  Lab 10/04/21 2009 10/05/21 0005 10/05/21 0353 10/05/21 0814 10/05/21 1115  GLUCAP 144* 137* 100* 94 116*    SpO2: 100 % O2 Flow Rate (L/min): 1 L/min    Vitals:   10/05/21 0459 10/05/21 0904 10/05/21 0922 10/05/21 0924  BP: (!) 170/60 (!) 172/53    Pulse: 86 90    Resp: 17 16    Temp: 98.5 F (36.9 C) (!) 97.3 F (36.3 C)    TempSrc: Oral Oral    SpO2: 99% 100% 100% 100%  Weight:      Height:         Intake/Output Summary (Last 24 hours) at 10/05/2021 1435 Last data filed at 10/05/2021 1251 Gross per 24 hour  Intake 820 ml  Output 1275 ml  Net -455 ml    11/08 1901 - 11/10 0700 In: 634 [P.O.:534] Out: 1295 [Urine:1295]  Filed Weights   10/03/21 0100  Weight: 50.9 kg    Data Reviewed: Basic Metabolic Panel: Recent Labs  Lab 10/02/21 0249 10/02/21 0926 10/03/21 0527 10/04/21 0452 10/05/21 0353  NA 139 138 137 139 139  K 4.5 4.8 4.9 5.0 5.1  CL 113* 113* 108 109 112*  CO2 15* 18* 20* 21* 21*  GLUCOSE 42* 117* 120* 108* 118*  BUN 51* 51* 58* 68* 72*  CREATININE 1.69* 1.71* 1.97* 1.96* 1.59*  CALCIUM 7.1* 7.0* 7.1* 7.1* 7.3*  MG  --   --  2.5*  --   --    Liver Function Tests: Recent Labs  Lab 09/30/21 1554 10/01/21 0719 10/05/21 0353  AST 35 34 48*  ALT 34 26 36  ALKPHOS 53 37* 76  BILITOT 0.6 0.3 0.4  PROT 6.1* 4.9* 4.6*  ALBUMIN 3.5 2.9* 1.9*   No results for input(s): LIPASE, AMYLASE in the last 168 hours. No results for input(s): AMMONIA in the last 168 hours. CBC: Recent Labs  Lab 09/30/21 1554 10/01/21 0719 10/01/21 1206 10/02/21 0926 10/03/21 0527 10/04/21 0452 10/05/21 0353  WBC 10.7* 10.5 12.5* 20.3* 20.8* 17.4* 13.9*  NEUTROABS 8.8* 6.2  --   --   --   --   --   HGB 9.4* 7.0* 6.8* 8.5* 8.8* 7.9* 7.6*  HCT 29.4* 22.3* 22.0* 25.6* 26.4* 24.7* 23.5*  MCV 103.2* 105.2* 106.3* 97.7 97.8 100.4* 99.2  PLT 207 163 131* 128* 121* 127* 137*   Cardiac Enzymes: Recent Labs  Lab 09/30/21 1554   CKTOTAL 146   BNP (last 3 results) No results for input(s): BNP in the last 8760 hours.  ProBNP (last 3 results) No results for input(s): PROBNP in the last 8760 hours.  CBG: Recent Labs  Lab 10/04/21 2009 10/05/21 0005 10/05/21 0353 10/05/21 0814 10/05/21 1115  GLUCAP 144* 137* 100* 94 116*       Radiology Reports  US RENAL  Result Date: 10/04/2021 CLINICAL DATA:  Acute kidney injury EXAM: RENAL / URINARY TRACT ULTRASOUND COMPLETE COMPARISON:  CT 10/01/2021 FINDINGS: Right Kidney: Renal measurements: 7.9 x 4.6 x 4.1 cm = volume: 61 mL. Cortex is echogenic. Diffuse cortical thinning consistent with atrophy. No hydronephrosis or mass. Left Kidney: Renal measurements: 9.5 x 4.8 x 4.5 cm = volume: 107.4 mL. Cortex slightly echogenic. Diffuse cortical thinning. No mass  or hydronephrosis Bladder: Decompressed by Foley catheter Other: None. IMPRESSION: 1. Slightly echogenic kidneys with diffuse cortical thinning/atrophy consistent with medical renal disease. 2. Negative for hydronephrosis Electronically Signed   By: Donavan Foil M.D.   On: 10/04/2021 16:52       Antibiotics: Anti-infectives (From admission, onward)    Start     Dose/Rate Route Frequency Ordered Stop   10/03/21 1600  ceFEPIme (MAXIPIME) 2 g in sodium chloride 0.9 % 100 mL IVPB        2 g 200 mL/hr over 30 Minutes Intravenous Every 24 hours 10/03/21 1523     10/03/21 1515  ceFEPIme (MAXIPIME) 1 g in sodium chloride 0.9 % 100 mL IVPB  Status:  Discontinued        1 g 200 mL/hr over 30 Minutes Intravenous  Once 10/03/21 1420 10/03/21 1523         DVT prophylaxis: Heparin  Code Status: Full code  Family Communication: No family at bedside   Consultants:   Procedures:     Objective    Physical Examination:  General-appears in no acute distress Heart-S1-S2, regular, no murmur auscultated Lungs-clear to auscultation bilaterally, no wheezing or crackles auscultated Abdomen-soft, nontender, no  organomegaly Extremities-right shoulder in sling Neuro-alert, oriented x3, no focal deficit noted  Status is: Inpatient  Dispo: The patient is from: Home              Anticipated d/c is to: Home              Anticipated d/c date is: 10/06/2021              Patient currently not stable for discharge  Barrier to discharge-none  COVID-19 Labs  No results for input(s): DDIMER, FERRITIN, LDH, CRP in the last 72 hours.  Lab Results  Component Value Date   [place] NEGATIVE 09/30/2021         Recent Results (from the past 240 hour(s))  Resp Panel by RT-PCR (Flu A&B, Covid) Nasopharyngeal Swab     Status: None   Collection Time: 09/30/21  3:39 PM   Specimen: Nasopharyngeal Swab; Nasopharyngeal(NP) swabs in vial transport medium  Result Value Ref Range Status   SARS Coronavirus 2 by RT PCR NEGATIVE NEGATIVE Final    Comment: (NOTE) SARS-CoV-2 target nucleic acids are NOT DETECTED.  The SARS-CoV-2 RNA is generally detectable in upper respiratory specimens during the acute phase of infection. The lowest concentration of SARS-CoV-2 viral copies this assay can detect is 138 copies/mL. A negative result does not preclude SARS-Cov-2 infection and should not be used as the sole basis for treatment or other patient management decisions. A negative result may occur with  improper specimen collection/handling, submission of specimen other than nasopharyngeal swab, presence of viral mutation(s) within the areas targeted by this assay, and inadequate number of viral copies(<138 copies/mL). A negative result must be combined with clinical observations, patient history, and epidemiological information. The expected result is Negative.  Fact Sheet for Patients:  EntrepreneurPulse.com.au  Fact Sheet for Healthcare Providers:  IncredibleEmployment.be  This test is no t yet approved or cleared by the [place] FDA and  has been authorized for  detection and/or diagnosis of SARS-CoV-2 by FDA under an Emergency Use Authorization (EUA). This EUA will remain  in effect (meaning this test can be used) for the duration of the COVID-19 declaration under Section 564(b)(1) of the Act, 21 U.S.C.section 360bbb-3(b)(1), unless the authorization is terminated  or revoked sooner.  Influenza A by PCR NEGATIVE NEGATIVE Final   Influenza B by PCR NEGATIVE NEGATIVE Final    Comment: (NOTE) The Xpert Xpress SARS-CoV-2/FLU/RSV plus assay is intended as an aid in the diagnosis of influenza from Nasopharyngeal swab specimens and should not be used as a sole basis for treatment. Nasal washings and aspirates are unacceptable for Xpert Xpress SARS-CoV-2/FLU/RSV testing.  Fact Sheet for Patients: EntrepreneurPulse.com.au  Fact Sheet for Healthcare Providers: IncredibleEmployment.be  This test is not yet approved or cleared by the [place] FDA and has been authorized for detection and/or diagnosis of SARS-CoV-2 by FDA under an Emergency Use Authorization (EUA). This EUA will remain in effect (meaning this test can be used) for the duration of the COVID-19 declaration under Section 564(b)(1) of the Act, 21 U.S.C. section 360bbb-3(b)(1), unless the authorization is terminated or revoked.  Performed at [facility], [place] [address] [place], [place] [address]   Culture, blood (routine x 2)     Status: None (Preliminary result)   Collection Time: 10/03/21  7:35 AM   Specimen: BLOOD RIGHT HAND  Result Value Ref Range Status   Specimen Description BLOOD RIGHT HAND  Final   Special Requests   Final    BOTTLES DRAWN AEROBIC AND ANAEROBIC Blood Culture adequate volume   Culture   Final    NO GROWTH 2 DAYS Performed at [facility], [place] [address], [place], [place] [address]    Report Status PENDING  Incomplete  Culture, blood (routine x 2)     Status: None (Preliminary result)    Collection Time: 10/03/21  7:35 AM   Specimen: BLOOD RIGHT ARM  Result Value Ref Range Status   Specimen Description BLOOD RIGHT ARM  Final   Special Requests   Final    BOTTLES DRAWN AEROBIC ONLY Blood Culture results may not be optimal due to an inadequate volume of blood received in culture bottles   Culture   Final    NO GROWTH 2 DAYS Performed at [facility], [place] [address], [place], [place] [address]    Report Status PENDING  Incomplete  Urine Culture     Status: Abnormal   Collection Time: 10/03/21 11:17 AM   Specimen: Urine, Clean Catch  Result Value Ref Range Status   Specimen Description URINE, CLEAN CATCH  Final   Special Requests   Final    NONE Performed at [facility], [place] [address] [place], [place] [address]    Culture 30,000 COLONIES/mL ENTEROCOCCUS FAECALIS (A)  Final   Report Status 10/05/2021 FINAL  Final   Organism ID, Bacteria ENTEROCOCCUS FAECALIS (A)  Final      Susceptibility   Enterococcus faecalis - MIC*    AMPICILLIN <=2 SENSITIVE Sensitive     NITROFURANTOIN <=16 SENSITIVE Sensitive     VANCOMYCIN 1 SENSITIVE Sensitive     * 30,000 COLONIES/mL ENTEROCOCCUS [place]   [facility] If 7PM-7AM, please contact night-coverage at [email or web address], Office  [phone]   10/05/2021, 2:35 PM  LOS: 4 days

## 2021-10-06 ENCOUNTER — Other Ambulatory Visit: Payer: Self-pay

## 2021-10-06 ENCOUNTER — Inpatient Hospital Stay (HOSPITAL_COMMUNITY)
Admission: RE | Admit: 2021-10-06 | Discharge: 2021-10-12 | DRG: 560 | Disposition: A | Discharge status: Other Acute Inpatient Hospital | Payer: Medicare Other | Source: Intra-hospital | Attending: Physical Medicine and Rehabilitation | Admitting: Physical Medicine and Rehabilitation

## 2021-10-06 ENCOUNTER — Encounter (HOSPITAL_COMMUNITY): Payer: Self-pay | Admitting: Physical Medicine and Rehabilitation

## 2021-10-06 DIAGNOSIS — I5022 Chronic systolic (congestive) heart failure: Secondary | ICD-10-CM | POA: Diagnosis present

## 2021-10-06 DIAGNOSIS — S42201D Unspecified fracture of upper end of right humerus, subsequent encounter for fracture with routine healing: Secondary | ICD-10-CM

## 2021-10-06 DIAGNOSIS — Z85828 Personal history of other malignant neoplasm of skin: Secondary | ICD-10-CM

## 2021-10-06 DIAGNOSIS — G8929 Other chronic pain: Secondary | ICD-10-CM | POA: Diagnosis present

## 2021-10-06 DIAGNOSIS — K922 Gastrointestinal hemorrhage, unspecified: Secondary | ICD-10-CM | POA: Diagnosis present

## 2021-10-06 DIAGNOSIS — I480 Paroxysmal atrial fibrillation: Secondary | ICD-10-CM | POA: Diagnosis present

## 2021-10-06 DIAGNOSIS — K567 Ileus, unspecified: Secondary | ICD-10-CM | POA: Diagnosis not present

## 2021-10-06 DIAGNOSIS — M353 Polymyalgia rheumatica: Secondary | ICD-10-CM | POA: Diagnosis present

## 2021-10-06 DIAGNOSIS — E876 Hypokalemia: Secondary | ICD-10-CM | POA: Diagnosis present

## 2021-10-06 DIAGNOSIS — N1831 Chronic kidney disease, stage 3a: Secondary | ICD-10-CM | POA: Diagnosis present

## 2021-10-06 DIAGNOSIS — D62 Acute posthemorrhagic anemia: Secondary | ICD-10-CM | POA: Diagnosis not present

## 2021-10-06 DIAGNOSIS — N179 Acute kidney failure, unspecified: Secondary | ICD-10-CM | POA: Diagnosis present

## 2021-10-06 DIAGNOSIS — W19XXXD Unspecified fall, subsequent encounter: Secondary | ICD-10-CM | POA: Diagnosis present

## 2021-10-06 DIAGNOSIS — S32810S Multiple fractures of pelvis with stable disruption of pelvic ring, sequela: Secondary | ICD-10-CM

## 2021-10-06 DIAGNOSIS — S329XXA Fracture of unspecified parts of lumbosacral spine and pelvis, initial encounter for closed fracture: Secondary | ICD-10-CM | POA: Diagnosis present

## 2021-10-06 DIAGNOSIS — R110 Nausea: Secondary | ICD-10-CM | POA: Diagnosis not present

## 2021-10-06 DIAGNOSIS — I517 Cardiomegaly: Secondary | ICD-10-CM | POA: Diagnosis not present

## 2021-10-06 DIAGNOSIS — Z79890 Hormone replacement therapy (postmenopausal): Secondary | ICD-10-CM | POA: Diagnosis not present

## 2021-10-06 DIAGNOSIS — E039 Hypothyroidism, unspecified: Secondary | ICD-10-CM | POA: Diagnosis present

## 2021-10-06 DIAGNOSIS — Z803 Family history of malignant neoplasm of breast: Secondary | ICD-10-CM

## 2021-10-06 DIAGNOSIS — Z79899 Other long term (current) drug therapy: Secondary | ICD-10-CM | POA: Diagnosis not present

## 2021-10-06 DIAGNOSIS — R1310 Dysphagia, unspecified: Secondary | ICD-10-CM | POA: Diagnosis not present

## 2021-10-06 DIAGNOSIS — S32511D Fracture of superior rim of right pubis, subsequent encounter for fracture with routine healing: Secondary | ICD-10-CM | POA: Diagnosis not present

## 2021-10-06 DIAGNOSIS — Y929 Unspecified place or not applicable: Secondary | ICD-10-CM

## 2021-10-06 DIAGNOSIS — R339 Retention of urine, unspecified: Secondary | ICD-10-CM | POA: Diagnosis present

## 2021-10-06 DIAGNOSIS — Z952 Presence of prosthetic heart valve: Secondary | ICD-10-CM | POA: Diagnosis not present

## 2021-10-06 DIAGNOSIS — D72829 Elevated white blood cell count, unspecified: Secondary | ICD-10-CM | POA: Diagnosis present

## 2021-10-06 DIAGNOSIS — M810 Age-related osteoporosis without current pathological fracture: Secondary | ICD-10-CM | POA: Diagnosis present

## 2021-10-06 DIAGNOSIS — S81811D Laceration without foreign body, right lower leg, subsequent encounter: Secondary | ICD-10-CM | POA: Diagnosis not present

## 2021-10-06 DIAGNOSIS — E7800 Pure hypercholesterolemia, unspecified: Secondary | ICD-10-CM | POA: Diagnosis present

## 2021-10-06 DIAGNOSIS — Z7982 Long term (current) use of aspirin: Secondary | ICD-10-CM

## 2021-10-06 DIAGNOSIS — Z7901 Long term (current) use of anticoagulants: Secondary | ICD-10-CM

## 2021-10-06 DIAGNOSIS — E875 Hyperkalemia: Secondary | ICD-10-CM | POA: Diagnosis present

## 2021-10-06 DIAGNOSIS — I130 Hypertensive heart and chronic kidney disease with heart failure and stage 1 through stage 4 chronic kidney disease, or unspecified chronic kidney disease: Secondary | ICD-10-CM | POA: Diagnosis present

## 2021-10-06 DIAGNOSIS — K5900 Constipation, unspecified: Secondary | ICD-10-CM | POA: Diagnosis present

## 2021-10-06 DIAGNOSIS — G4700 Insomnia, unspecified: Secondary | ICD-10-CM | POA: Diagnosis present

## 2021-10-06 DIAGNOSIS — R54 Age-related physical debility: Secondary | ICD-10-CM | POA: Diagnosis present

## 2021-10-06 DIAGNOSIS — K560 Paralytic ileus: Secondary | ICD-10-CM | POA: Diagnosis not present

## 2021-10-06 DIAGNOSIS — K219 Gastro-esophageal reflux disease without esophagitis: Secondary | ICD-10-CM | POA: Diagnosis present

## 2021-10-06 DIAGNOSIS — Z8719 Personal history of other diseases of the digestive system: Secondary | ICD-10-CM | POA: Diagnosis not present

## 2021-10-06 DIAGNOSIS — N189 Chronic kidney disease, unspecified: Secondary | ICD-10-CM | POA: Diagnosis not present

## 2021-10-06 DIAGNOSIS — Z7952 Long term (current) use of systemic steroids: Secondary | ICD-10-CM

## 2021-10-06 DIAGNOSIS — F419 Anxiety disorder, unspecified: Secondary | ICD-10-CM | POA: Diagnosis present

## 2021-10-06 DIAGNOSIS — I10 Essential (primary) hypertension: Secondary | ICD-10-CM | POA: Diagnosis present

## 2021-10-06 DIAGNOSIS — Z823 Family history of stroke: Secondary | ICD-10-CM

## 2021-10-06 DIAGNOSIS — S329XXD Fracture of unspecified parts of lumbosacral spine and pelvis, subsequent encounter for fracture with routine healing: Secondary | ICD-10-CM | POA: Diagnosis not present

## 2021-10-06 DIAGNOSIS — S42201S Unspecified fracture of upper end of right humerus, sequela: Secondary | ICD-10-CM

## 2021-10-06 DIAGNOSIS — S32501A Unspecified fracture of right pubis, initial encounter for closed fracture: Secondary | ICD-10-CM | POA: Diagnosis present

## 2021-10-06 LAB — GLUCOSE, CAPILLARY
Glucose-Capillary: 102 mg/dL — ABNORMAL HIGH (ref 70–99)
Glucose-Capillary: 112 mg/dL — ABNORMAL HIGH (ref 70–99)
Glucose-Capillary: 139 mg/dL — ABNORMAL HIGH (ref 70–99)
Glucose-Capillary: 79 mg/dL (ref 70–99)
Glucose-Capillary: 80 mg/dL (ref 70–99)

## 2021-10-06 MED ORDER — METHOCARBAMOL 500 MG PO TABS: 500.0000 mg | ORAL_TABLET | Freq: Four times a day (QID) | ORAL | Status: DC | PRN

## 2021-10-06 MED ORDER — POLYETHYLENE GLYCOL 3350 17 G PO PACK
17.0000 g | PACK | Freq: Every day | ORAL | Status: DC
  Administered 2021-10-07 – 2021-10-11 (×4): 17 g via ORAL
  Filled 2021-10-06 (×4): qty 1

## 2021-10-06 MED ORDER — POLYETHYLENE GLYCOL 3350 17 G PO PACK: 17.0000 g | PACK | Freq: Every day | ORAL | 0 refills | Status: DC

## 2021-10-06 MED ORDER — ATORVASTATIN CALCIUM 40 MG PO TABS: 40.0000 mg | ORAL_TABLET | Freq: Every day | ORAL | Status: AC

## 2021-10-06 MED ORDER — LIDOCAINE HCL URETHRAL/MUCOSAL 2 % EX GEL: CUTANEOUS | Status: DC | PRN

## 2021-10-06 MED ORDER — METHYLPREDNISOLONE 2 MG PO TABS: 2.0000 mg | ORAL_TABLET | Freq: Every day | ORAL | Status: DC

## 2021-10-06 MED ORDER — HEPARIN SODIUM (PORCINE) 5000 UNIT/ML IJ SOLN
5000.0000 [IU] | Freq: Two times a day (BID) | INTRAMUSCULAR | Status: DC
Start: 2021-10-06 — End: 2021-10-18

## 2021-10-06 MED ORDER — PROCHLORPERAZINE MALEATE 5 MG PO TABS
5.0000 mg | ORAL_TABLET | Freq: Four times a day (QID) | ORAL | Status: DC | PRN
  Administered 2021-10-07: 5 mg via ORAL
  Administered 2021-10-08 – 2021-10-09 (×2): 10 mg via ORAL
  Filled 2021-10-06 (×3): qty 2
  Filled 2021-10-06: qty 1

## 2021-10-06 MED ORDER — PANTOPRAZOLE SODIUM 40 MG PO TBEC
40.0000 mg | DELAYED_RELEASE_TABLET | Freq: Every day | ORAL | Status: DC
  Administered 2021-10-07 – 2021-10-11 (×5): 40 mg via ORAL
  Filled 2021-10-06 (×4): qty 1

## 2021-10-06 MED ORDER — BISACODYL 10 MG RE SUPP
10.0000 mg | Freq: Every day | RECTAL | Status: DC | PRN
  Filled 2021-10-06 (×2): qty 1

## 2021-10-06 MED ORDER — SODIUM CHLORIDE 0.9 % IV SOLN
2.0000 g | INTRAVENOUS | Status: AC
  Administered 2021-10-06 – 2021-10-07 (×2): 2 g via INTRAVENOUS
  Filled 2021-10-06 (×2): qty 2

## 2021-10-06 MED ORDER — GUAIFENESIN-DM 100-10 MG/5ML PO SYRP: 5.0000 mL | ORAL_SOLUTION | Freq: Four times a day (QID) | ORAL | Status: DC | PRN

## 2021-10-06 MED ORDER — LEVOTHYROXINE SODIUM 75 MCG PO TABS
75.0000 ug | ORAL_TABLET | Freq: Every day | ORAL | Status: DC
  Administered 2021-10-07 – 2021-10-11 (×5): 75 ug via ORAL
  Filled 2021-10-06 (×5): qty 1

## 2021-10-06 MED ORDER — CARVEDILOL 6.25 MG PO TABS
6.2500 mg | ORAL_TABLET | Freq: Two times a day (BID) | ORAL | Status: DC
Start: 2021-10-06 — End: 2022-04-19

## 2021-10-06 MED ORDER — ACETAMINOPHEN 325 MG PO TABS
325.0000 mg | ORAL_TABLET | ORAL | Status: DC | PRN
  Administered 2021-10-07 (×3): 650 mg via ORAL
  Administered 2021-10-08: 325 mg via ORAL
  Administered 2021-10-09 – 2021-10-12 (×3): 650 mg via ORAL
  Filled 2021-10-06 (×7): qty 2

## 2021-10-06 MED ORDER — SODIUM CHLORIDE 0.9 % IV SOLN: 2.0000 g | INTRAVENOUS | Status: AC

## 2021-10-06 MED ORDER — ENOXAPARIN SODIUM 30 MG/0.3ML IJ SOSY
30.0000 mg | PREFILLED_SYRINGE | INTRAMUSCULAR | Status: DC
  Administered 2021-10-06 – 2021-10-11 (×6): 30 mg via SUBCUTANEOUS
  Filled 2021-10-06 (×6): qty 0.3

## 2021-10-06 MED ORDER — METOPROLOL TARTRATE 12.5 MG HALF TABLET: 12.5000 mg | ORAL_TABLET | Freq: Four times a day (QID) | ORAL | Status: DC | PRN

## 2021-10-06 MED ORDER — ATORVASTATIN CALCIUM 40 MG PO TABS
40.0000 mg | ORAL_TABLET | Freq: Every day | ORAL | Status: DC
  Administered 2021-10-07 – 2021-10-11 (×5): 40 mg via ORAL
  Filled 2021-10-06 (×5): qty 1

## 2021-10-06 MED ORDER — ALLOPURINOL 100 MG PO TABS
100.0000 mg | ORAL_TABLET | Freq: Every day | ORAL | Status: DC
  Administered 2021-10-07 – 2021-10-11 (×5): 100 mg via ORAL
  Filled 2021-10-06 (×5): qty 1

## 2021-10-06 MED ORDER — CARVEDILOL 6.25 MG PO TABS
6.2500 mg | ORAL_TABLET | Freq: Two times a day (BID) | ORAL | Status: DC
  Administered 2021-10-07 – 2021-10-11 (×10): 6.25 mg via ORAL
  Filled 2021-10-06 (×10): qty 1

## 2021-10-06 MED ORDER — ADULT MULTIVITAMIN W/MINERALS CH
1.0000 | ORAL_TABLET | Freq: Every day | ORAL | Status: DC
  Administered 2021-10-07 – 2021-10-11 (×4): 1 via ORAL
  Filled 2021-10-06 (×5): qty 1

## 2021-10-06 MED ORDER — DIPHENHYDRAMINE HCL 12.5 MG/5ML PO ELIX: 12.5000 mg | ORAL_SOLUTION | Freq: Four times a day (QID) | ORAL | Status: DC | PRN

## 2021-10-06 MED ORDER — AMLODIPINE BESYLATE 2.5 MG PO TABS
2.5000 mg | ORAL_TABLET | Freq: Every day | ORAL | Status: DC
  Administered 2021-10-07 – 2021-10-11 (×5): 2.5 mg via ORAL
  Filled 2021-10-06 (×5): qty 1

## 2021-10-06 MED ORDER — PROCHLORPERAZINE EDISYLATE 10 MG/2ML IJ SOLN
5.0000 mg | Freq: Four times a day (QID) | INTRAMUSCULAR | Status: DC | PRN
  Filled 2021-10-06: qty 2

## 2021-10-06 MED ORDER — ENSURE ENLIVE PO LIQD
237.0000 mL | Freq: Three times a day (TID) | ORAL | Status: DC
  Administered 2021-10-06 – 2021-10-08 (×4): 237 mL via ORAL

## 2021-10-06 MED ORDER — OXYCODONE HCL 5 MG PO TABS: 5.0000 mg | ORAL_TABLET | Freq: Four times a day (QID) | ORAL | 0 refills | Status: DC | PRN

## 2021-10-06 MED ORDER — MILK AND MOLASSES ENEMA
1.0000 | Freq: Once | RECTAL | Status: DC
  Filled 2021-10-06: qty 240

## 2021-10-06 MED ORDER — HYDRALAZINE HCL 25 MG PO TABS: 25.0000 mg | ORAL_TABLET | Freq: Four times a day (QID) | ORAL | Status: DC | PRN

## 2021-10-06 MED ORDER — MELATONIN 5 MG PO TABS
5.0000 mg | ORAL_TABLET | Freq: Every evening | ORAL | Status: DC | PRN
  Administered 2021-10-09: 5 mg via ORAL
  Filled 2021-10-06: qty 1

## 2021-10-06 MED ORDER — POLYETHYLENE GLYCOL 3350 17 G PO PACK: 17.0000 g | PACK | Freq: Every day | ORAL | Status: DC | PRN

## 2021-10-06 MED ORDER — OXYCODONE HCL 5 MG PO TABS
5.0000 mg | ORAL_TABLET | Freq: Four times a day (QID) | ORAL | Status: DC | PRN
  Administered 2021-10-06 – 2021-10-12 (×10): 5 mg via ORAL
  Filled 2021-10-06 (×12): qty 1

## 2021-10-06 MED ORDER — PROCHLORPERAZINE 25 MG RE SUPP: 12.5000 mg | Freq: Four times a day (QID) | RECTAL | Status: DC | PRN

## 2021-10-06 MED ORDER — ALUM & MAG HYDROXIDE-SIMETH 200-200-20 MG/5ML PO SUSP: 30.0000 mL | ORAL | Status: DC | PRN

## 2021-10-06 MED ORDER — SENNOSIDES-DOCUSATE SODIUM 8.6-50 MG PO TABS
2.0000 | ORAL_TABLET | Freq: Two times a day (BID) | ORAL | Status: DC
  Administered 2021-10-06 – 2021-10-11 (×9): 2 via ORAL
  Filled 2021-10-06 (×10): qty 2

## 2021-10-06 MED ORDER — POLYETHYLENE GLYCOL 3350 17 G PO PACK
17.0000 g | PACK | Freq: Every day | ORAL | Status: DC
  Administered 2021-10-06: 17 g via ORAL
  Filled 2021-10-06: qty 1

## 2021-10-06 MED ORDER — METHYLPREDNISOLONE 4 MG PO TABS
4.0000 mg | ORAL_TABLET | Freq: Every day | ORAL | Status: DC
  Administered 2021-10-07 – 2021-10-11 (×5): 4 mg via ORAL
  Filled 2021-10-06 (×6): qty 1

## 2021-10-06 MED ORDER — ENOXAPARIN SODIUM 40 MG/0.4ML IJ SOSY: 40.0000 mg | PREFILLED_SYRINGE | INTRAMUSCULAR | Status: DC

## 2021-10-06 MED ORDER — FLEET ENEMA 7-19 GM/118ML RE ENEM: 1.0000 | ENEMA | Freq: Once | RECTAL | Status: DC | PRN

## 2021-10-06 NOTE — Progress Notes (Signed)
Meredith Staggers, MD   Physician  Physical Medicine and Rehabilitation  PMR Pre-admission      Signed  Date of Service:  10/05/2021  4:48 PM       Related encounter: ED to Hosp-Admission (Current) from 10/01/2021 in [facility] all $RemoveBe'[x]'MnCOXakhd$ Written$R'[x]'rq$ Templat'[]'$ Copied  Added by: $RemoveB'[x]'yLugeyko$ Julious Payer, Vertis Kelch, RN$RemoveBeforeD'[x]'ukTWBxvtgNHUEb$ Meredith Staggers, MD  $R'[]'mx$ [place] for details                                                                                                                                                                                                                                                                                                                                                                                                                                        PMR Admission Coordinator Pre-Admission Assessment   Patient: [name] is an 87 y.o., female MRN: [id number] DOB: [birth year] Height: $RemoveBeforeDE'4\' 8"'mtEGihXUtnLXTkU$  (142.2 cm)Weight: 50.9 kg   Insurance Information HMO:     PPO:      PCP:      IPA:      80/20:      OTHER:  PRIMARY: Medicare a and b      Policy#: [id number]      Subscriber: pt Benefits:  Phone #: passport one source     Name: 11/10 Eff. Date: a 01/25/1999  and b 07/28/1999     Deduct: $1556      Out of Pocket Max: none      Life Max: none CIR: 100%      SNF: 20 full days Outpatient: 80%     Co-Pay: 20% Home Health: 100%      Co-Pay: none DME: 80%     Co-Pay: 20% Providers: pt choice  SECONDARY: [employer]      Policy#: [id number]        Financial Counselor:       Phone#:    The "Data Collection Information Summary" for patients in Inpatient Rehabilitation Facilities with attached "Privacy Act [place] Records" was provided and verbally reviewed with: Patient and  Family   Emergency Contact Information Contact Information       Name Relation Home Work Mobile    Smith,[name] Daughter [phone]   [phone]    Mare Ferrari Daughter [phone]             Current Medical History  Patient Admitting Diagnosis: polytrauma   History of Present Illness: 87 year old female with medical history significant of PAF on Eliquis started recently, nonischemic cardiomyopathy, mitral stenosis post repair, SSS status post PPM HTN, CKD stage III, chronic iron deficient anemia, polymyalgia rheumatica on chronic steroids, chronic back pain on narcotics and sees Dr Brien Few who presented on 10/01/2021 with question of syncope/near syncope and fall. Fell at home while setting up home decorations .    Imaging showed transverse fracture of proximal humeral neck and fracture greater tuberosity. Sent home with outpatient follow up with ortho., She was re scanned and found to have a right pubic ramus as well as superior pubic ramus fracture. K was 5.8 and Hgb 6.8.   Anemia felt due to multiple hematomas including right forehead, right arm FX, pelvic FX and right leg wound. Transfused and to monitor. Eliquis on hold. Started on heparin for DVT prophylaxis. Chronic diastolic CHF and is euvolemic. Lasix on hold. Leukocytosis with pro calcitonin elevated. Patient empirically started on Cefepime. CXR negative, UA clear and blood cultures penidng. Creat up to 1.96 with Lasix held. HTN and Coreg restarted at lower dose and amlodipine. Continue to hole ACE inhibitor. Urinary retention so Foley placed. Right calf skin ulceration with WOC consulted. PAF, Coreg low dose started, Eliquis on hold. Hypothyroidism on Synthroid. Polymyalgia rheumatica on Medrol as stress does. Continue Protonix.   Patient's medical record [facility]  has been reviewed by the rehabilitation admission coordinator and physician.   Past Medical History      Past Medical History:  Diagnosis Date    Allergic rhinitis     Anemia      iron deficient   Aneurysm (HCC)     Anxiety     Basal cell carcinoma     Chronic anemia     Chronic low back pain     Colon polyps     GERD (gastroesophageal reflux disease)     Hiatal hernia     HTN (hypertension)     Hypercholesteremia     Hyperlipidemia     Hypothyroidism     Insomnia     Left heart failure (HCC)     Osteoporosis      S/P forteo treatment as of 02/2009   Pacemaker     Polymyalgia ([place])      rheumatica-steroids per Rheum   Systolic heart failure        Has the patient had major  surgery during 100 days prior to admission? No   Family History   family history includes Breast cancer in her sister; Cancer (age of onset: 64) in her father; Colon cancer in her brother and sister; Stroke in her mother.   Current Medications   Current Facility-Administered Medications:    allopurinol (ZYLOPRIM) tablet 100 mg, 100 mg, Oral, Daily, [place], Ping T, MD, 100 mg at 10/06/21 0941   amLODipine (NORVASC) tablet 2.5 mg, 2.5 mg, Oral, Daily, Darrick Meigs, Gagan S, MD, 2.5 mg at 10/06/21 0941   atorvastatin (LIPITOR) tablet 40 mg, 40 mg, Oral, Daily, Wynetta Fines T, MD, 40 mg at 10/06/21 0941   bisacodyl (DULCOLAX) EC tablet 5 mg, 5 mg, Oral, Daily PRN, Wynetta Fines T, MD, 5 mg at 10/06/21 0944   carvedilol (COREG) tablet 6.25 mg, 6.25 mg, Oral, BID WC, Darrick Meigs, Gagan S, MD, 6.25 mg at 10/06/21 0940   ceFEPIme (MAXIPIME) 2 g in sodium chloride 0.9 % 100 mL IVPB, 2 g, Intravenous, Q24H, Darrick Meigs, Gagan S, MD, Last Rate: 200 mL/hr at 10/05/21 2136, 2 g at 10/05/21 2136   Chlorhexidine Gluconate Cloth 2 % PADS 6 each, 6 each, Topical, Daily, Elgergawy, Silver Huguenin, MD, 6 each at 10/05/21 1427   dextrose 50 % solution 50 mL, 1 ampule, Intravenous, Q1H PRN, Howerter, Justin B, DO   feeding supplement (ENSURE ENLIVE / ENSURE PLUS) liquid 237 mL, 237 mL, Oral, TID BM, Elgergawy, Silver Huguenin, MD, 237 mL at 10/06/21 0946   heparin injection 5,000 Units, 5,000 Units,  Subcutaneous, Q12H, Wynetta Fines T, MD, 5,000 Units at 10/06/21 0951   hydrALAZINE (APRESOLINE) tablet 25 mg, 25 mg, Oral, Q6H PRN, Wynetta Fines T, MD, 25 mg at 10/05/21 2334   HYDROmorphone (DILAUDID) injection 0.5 mg, 0.5 mg, Intravenous, Q4H PRN, Wynetta Fines T, MD, 0.5 mg at 10/06/21 6387   levothyroxine (SYNTHROID) tablet 75 mcg, 75 mcg, Oral, Daily, Wynetta Fines T, MD, 75 mcg at 10/06/21 5643   methylPREDNISolone (MEDROL) tablet 4 mg, 4 mg, Oral, Daily, Wynetta Fines T, MD, 4 mg at 10/06/21 0940   metoprolol tartrate (LOPRESSOR) tablet 12.5 mg, 12.5 mg, Oral, Q6H PRN, Wynetta Fines T, MD   multivitamin with minerals tablet 1 tablet, 1 tablet, Oral, Daily, Elgergawy, Silver Huguenin, MD, 1 tablet at 10/06/21 0951   ondansetron (ZOFRAN) injection 4 mg, 4 mg, Intravenous, Q6H PRN, Wynetta Fines T, MD, 4 mg at 10/05/21 2333   oxyCODONE (Oxy IR/ROXICODONE) immediate release tablet 5 mg, 5 mg, Oral, Q6H PRN, Darrick Meigs, Gagan S, MD, 5 mg at 10/05/21 1704   pantoprazole (PROTONIX) EC tablet 40 mg, 40 mg, Oral, Daily, Heloise Purpura, RPH, 40 mg at 10/06/21 0941   polyethylene glycol (MIRALAX / GLYCOLAX) packet 17 g, 17 g, Oral, Daily, Darrick Meigs, Marge Duncans, MD   senna-docusate (Senokot-S) tablet 1 tablet, 1 tablet, Oral, QHS PRN, Lequita Halt, MD   senna-docusate (Senokot-S) tablet 2 tablet, 2 tablet, Oral, BID, Elgergawy, Silver Huguenin, MD, 2 tablet at 10/06/21 0941   Patients Current Diet:  Diet Order                  Diet - low sodium heart healthy             DIET SOFT Room service appropriate? Yes; Fluid consistency: Thin  Diet effective now                         Precautions / Restrictions Precautions Precautions: Fall,  Other (comment) Precaution Comments: fragile skin, multiple skin tears Restrictions Weight Bearing Restrictions: Yes RUE Weight Bearing: Non weight bearing RLE Weight Bearing: Weight bearing as tolerated LLE Weight Bearing: Weight bearing as tolerated Other Position/Activity Restrictions:  "Ok to come out of sling for ADL's, but keep elbow close to body. No PROM/AROM until 2-3 weeks."    Has the patient had 2 or more falls or a fall with injury in the past year? Yes   Prior Activity Level Community (5-7x/wk): independent and living alone; drives short distances only   Prior Functional Level Self Care: Did the patient need help bathing, dressing, using the toilet or eating? Independent   Indoor Mobility: Did the patient need assistance with walking from room to room (with or without device)? Independent   Stairs: Did the patient need assistance with internal or external stairs (with or without device)? Independent   Functional Cognition: Did the patient need help planning regular tasks such as shopping or remembering to take medications? Independent   Patient Information Are you of Hispanic, Latino/a,or Spanish origin?: A. No, not of Hispanic, Latino/a, or Spanish origin What is your race?: A. White Do you need or want an interpreter to communicate with a doctor or health care staff?: 0. No   Patient's Response To:  Health Literacy and Transportation Is the patient able to respond to health literacy and transportation needs?: Yes Health Literacy - How often do you need to have someone help you when you read instructions, pamphlets, or other written material from your doctor or pharmacy?: Never In the past 12 months, has lack of transportation kept you from medical appointments or from getting medications?: No In the past 12 months, has lack of transportation kept you from meetings, work, or from getting things needed for daily living?: No   Development worker, international aid / [place] Devices/Equipment: None Home Equipment: Conservation officer, nature (2 wheels)   Prior Device Use: Indicate devices/aids used by the patient prior to current illness, exacerbation or injury?  Cane at times   Current Functional Level Cognition   Overall Cognitive Status: Within Functional Limits  for tasks assessed Orientation Level: Oriented X4 General Comments: aware of RUE limitations    Extremity Assessment (includes Sensation/Coordination)   Upper Extremity Assessment: RUE deficits/detail RUE Deficits / Details: humerus fx, non-op management. NWB, no PROM, sling at all times except for ADLs. limited composite flexion & extension. significant edema RUE: Unable to fully assess due to pain, Unable to fully assess due to immobilization RUE Sensation: WNL RUE Coordination: decreased gross motor, decreased fine motor  Lower Extremity Assessment: Generalized weakness, RLE deficits/detail, LLE deficits/detail RLE Deficits / Details: Increased edema, pt able to perform LAQ, ankle dorsiflexion 3/5 LLE Deficits / Details: Increased edema. Pt able to perform limited heel slide and SLR. Ankle dorsiflexion 2/5     ADLs   Overall ADL's : Needs assistance/impaired Eating/Feeding: Set up, Sitting Grooming: Minimal assistance, Sitting Grooming Details (indicate cue type and reason): washed face and combed hair sitting on eob Upper Body Bathing: Moderate assistance, Sitting Lower Body Bathing: Maximal assistance, +2 for physical assistance, Sit to/from stand Upper Body Dressing : Maximal assistance, Sitting Lower Body Dressing: Maximal assistance, +2 for physical assistance, Sit to/from stand Toilet Transfer: Maximal assistance, +2 for physical assistance, Stand-pivot Toileting- Clothing Manipulation and Hygiene: Maximal assistance, +2 for physical assistance, Sit to/from stand Functional mobility during ADLs: Maximal assistance, +2 for physical assistance General ADL Comments: assistance due to RUE limitations  Mobility   Overal bed mobility: Needs Assistance Bed Mobility: Supine to Sit, Sit to Supine, Rolling Rolling: Max assist Supine to sit: Max assist, HOB elevated Sit to supine: Max assist General bed mobility comments: rolling performed for cleaning following BM.  Bed pad used  to assist with supine to sit and sit to supine     Transfers   Overall transfer level: Needs assistance Equipment used: None Transfers: Sit to/from Stand Sit to Stand: Max assist General transfer comment: sit to stands performed from eob with max assist of one     Ambulation / Gait / Stairs / Wheelchair Mobility         Posture / Balance Dynamic Sitting Balance Sitting balance - Comments: able to perform light grooming seated on eob Balance Overall balance assessment: Needs assistance Sitting-balance support: Feet supported Sitting balance-Leahy Scale: Fair Sitting balance - Comments: able to perform light grooming seated on eob Standing balance support: Single extremity supported Standing balance-Leahy Scale: Poor Standing balance comment: performed from eob     Special needs/care consideration Wound right LE Urethral catheter 16 FR placed 11/8    Previous Home Environment  Living Arrangements: Alone  Lives With: Alone Available Help at Discharge: Family, Available 24 hours/day (2 local daughters to provide 24/7 assist) Type of Home: House Home Layout: One level Home Access: Stairs to enter Entrance Stairs-Rails: Left Entrance Stairs-Number of Steps: 1 Bathroom Shower/Tub: Multimedia programmer: Standard Bathroom Accessibility: Yes How Accessible: Accessible via walker [place]: No   Discharge Living Setting Plans for Discharge Living Setting: Lives with (comment) (to go stay with local daughter, [name]) Type of Home at Discharge: House Discharge Home Layout: One level Discharge Home Access: Stairs to enter Entrance Stairs-Rails: Right, Left, Can reach both Entrance Stairs-Number of Steps: 3 to 4 Discharge Bathroom Shower/Tub: Tub/shower unit Discharge Bathroom Toilet: Standard Discharge Bathroom Accessibility: Yes How Accessible: Accessible via walker Does the patient have any problems obtaining your medications?: No   Social/Family/Support  Systems Patient Roles: Parent Contact Information: daughter [name] and daughter, [name] Anticipated Caregiver: [name] at her home and Cornlea coming to assist after work Anticipated Ambulance person Information: see contacts Ability/Limitations of Caregiver: [name] works at [employer] during the day, [employer] uses a [facility] for knee issues Caregiver Availability: 24/7 Discharge Plan Discussed with Primary Caregiver: Yes Is Caregiver In Agreement with Plan?: Yes Does Caregiver/Family have Issues with Lodging/Transportation while Pt is in Rehab?: No   Goals Patient/Family Goal for Rehab: supervision to min assist with PT and OT Expected length of stay: ELOS 10 to 12 days Pt/Family Agrees to Admission and willing to participate: Yes Program Orientation Provided & Reviewed with Pt/Caregiver Including Roles  & Responsibilities: Yes   Decrease burden of Care through IP rehab admission: n/a   Possible need for SNF placement upon discharge: not anticipated   Patient Condition: I have reviewed medical records from [facility] , spoken with CM, and patient and daughter. I met with patient at the bedside for inpatient rehabilitation assessment.  Patient will benefit from ongoing PT and OT, can actively participate in 3 hours of therapy a day 5 days of the week, and can make measurable gains during the admission.  Patient will also benefit from the coordinated team approach during an Inpatient Acute Rehabilitation admission.  The patient will receive intensive therapy as well as Rehabilitation physician, nursing, social worker, and care management interventions.  Due to bladder management, bowel management, safety, skin/wound care, disease management, medication  administration, pain management, and patient education the patient requires 24 hour a day rehabilitation nursing.  The patient is currently mod assist with mobility and basic ADLs.  Discharge setting and therapy post discharge at home with home  health is anticipated.  Patient has agreed to participate in the Acute Inpatient Rehabilitation Program and will admit today.   Preadmission Screen Completed By:  Cleatrice Burke, 10/06/2021 10:46 AM ______________________________________________________________________   Discussed status with Dr. Naaman Plummer on  10/06/2021 at 1050 and received approval for admission today.   Admission Coordinator:  Cleatrice Burke, RN, time  1050 Date  10/06/2021    Assessment/Plan: Diagnosis: polytrauma Does the need for close, 24 hr/day Medical supervision in concert with the patient's rehab needs make it unreasonable for this patient to be served in a less intensive setting? Yes Co-Morbidities requiring supervision/potential complications: PAF, NICM, SSS, CKD III, PMR Due to bladder management, bowel management, safety, skin/wound care, disease management, medication administration, pain management, and patient education, does the patient require 24 hr/day rehab nursing? Yes Does the patient require coordinated care of a physician, rehab nurse, PT, OT to address physical and functional deficits in the context of the above medical diagnosis(es)? Yes Addressing deficits in the following areas: balance, endurance, locomotion, strength, transferring, bowel/bladder control, bathing, dressing, feeding, grooming, toileting, and psychosocial support Can the patient actively participate in an intensive therapy program of at least 3 hrs of therapy 5 days a week? Yes The potential for patient to make measurable gains while on inpatient rehab is excellent Anticipated functional outcomes upon discharge from inpatient rehab: supervision and min assist PT, supervision and min assist OT, n/a SLP Estimated rehab length of stay to reach the above functional goals is: 10-12 days Anticipated discharge destination: Home 10. Overall Rehab/Functional Prognosis: excellent     MD Signature: Meredith Staggers, MD,  [place] [facility] 10/06/2021          Revision History                     Note Details  Author Meredith Staggers, MD File Time 10/06/2021 11:02 AM  Author Type Physician Status Signed  Last Editor Meredith Staggers, MD Service Physical Medicine and Rehabilitation

## 2021-10-06 NOTE — Progress Notes (Signed)
Inpatient Rehabilitation Admissions Coordinator   CIR bed is available to admit patient to today., I have contacted acute team, TOC and met with patient at bedside. I will make the arrangements to admit today.  Danne Baxter, RN, MSN Rehab Admissions Coordinator [phone] 10/06/2021 10:40 AM

## 2021-10-06 NOTE — H&P (Signed)
Physical Medicine and Rehabilitation Admission H&P        Chief Complaint  Patient presents with   Functional decline    Pelvic and humerus fractures       HPI: [name] RH-female with history of HTN, chronic LBP, anemia, recent diagnosis of PAF- on eliquis, PPM, CKD, PMR-chronic steroids who was admitted on 10/01/21 with fall the day before and found to have transverse proximal humeral head fracture and right pubic rami and superior rami fractures. She was also noted to be anemic and had hyperkalemia requiring dose of Lokelma. She was started on stress dose steroids and elquis held. Right calf laceration stapled and ortho consulted for input. Dr. Stann Mainland recommended non-operative management recommended with RUE placed in sling and to be NWB--ok to doff for ADL with elbow close to body and no ROM for at least 2-3 weeks. WBAT on RLE with recs to follow up in 2-3 weeks for follow up films. Hospital course significant for brief runs of asymptomatic VT as well as acute on chronic anemia requiring one unit PRBC. Lasix held due to worsening of renal status and foley placed due to urinary retention. Cefepime added due to leucocytosis on 11/08 due to sepsis and to complete 5 day course antibiotic regimen. She as      Review of Systems  Constitutional:  Positive for malaise/fatigue. Negative for chills and fever.  HENT:  Negative for hearing loss.   Eyes:  Negative for blurred vision and double vision.  Respiratory:  Negative for cough and shortness of breath.   Cardiovascular:  Negative for chest pain.  Gastrointestinal:  Positive for constipation. Negative for abdominal pain, heartburn and nausea.  Genitourinary:        Foley in palce  Musculoskeletal:  Positive for joint pain and myalgias.  Skin:  Negative for rash.  Neurological:  Positive for dizziness and weakness.  Psychiatric/Behavioral:  The patient does not have insomnia.           Past Medical History:  Diagnosis Date    Allergic rhinitis     Anemia      iron deficient   Aneurysm (HCC)     Anxiety     Basal cell carcinoma     Chronic anemia     Chronic low back pain     Colon polyps     GERD (gastroesophageal reflux disease)     Hiatal hernia     HTN (hypertension)     Hypercholesteremia     Hyperlipidemia     Hypothyroidism     Insomnia     Left heart failure ([place])     Osteoporosis      S/P forteo treatment as of 02/2009   Pacemaker     Polymyalgia ([place])      rheumatica-steroids per Rheum   Systolic heart failure             Past Surgical History:  Procedure Laterality Date   ABDOMINAL HYSTERECTOMY       ANGIOPLASTY       BACK SURGERY       CARDIAC CATHETERIZATION   10/16/06   CATARACT EXTRACTION       defibulator implant       EP IMPLANTABLE DEVICE N/A 04/10/2016    Procedure: BiV Pacemaker Insertion CRT-P;  Surgeon: Evans Lance, MD;  Location: [facility];  Service: Cardiovascular;  Laterality: N/A;   FOOT SURGERY       MITRAL VALVE REPAIR  skin cancer removal       THYROIDECTOMY       TONSILLECTOMY               Family History  Problem Relation Age of Onset   Stroke Mother          in her 49's   Cancer Father 67        secondary   Colon cancer Brother     Colon cancer Sister     Breast cancer Sister        Social History: Lives alone and independent without AD. She reports that she has never smoked. She has never used smokeless tobacco. She reports that she does not drink alcohol and does not use drugs.          Allergies  Allergen Reactions   Pertussis Vaccines Swelling   Sulfa Antibiotics Nausea Only   Amoxicillin Rash and Other (See Comments)      Has patient had a PCN reaction causing immediate rash, facial/tongue/throat swelling, SOB or lightheadedness with hypotension: yes Has patient had a PCN reaction causing severe rash involving mucus membranes or skin necrosis: no Has patient had a PCN reaction that required hospitalization no Has patient  had a PCN reaction occurring within the last 10 years: yes If all of the above answers are "NO", then may proceed with Cephalosporin use.            Medications Prior to Admission  Medication Sig Dispense Refill   allopurinol (ZYLOPRIM) 100 MG tablet Take 100 mg by mouth daily.       amLODipine (NORVASC) 2.5 MG tablet Take 2.5 mg by mouth daily.       apixaban (ELIQUIS) 2.5 MG TABS tablet Take 1 tablet (2.5 mg total) by mouth 2 (two) times daily. 180 tablet 3   aspirin EC 81 MG tablet Take 81 mg by mouth daily. Swallow whole.       atorvastatin (LIPITOR) 40 MG tablet Take 40 mg by mouth daily.       Cholecalciferol (VITAMIN D3) 1000 UNITS CAPS Take 1,000 Units by mouth daily.        furosemide (LASIX) 40 MG tablet Take 20-40 tablets by mouth in the morning and at bedtime. Take 1 tablet in the morning and 1/2 tablet in the evening.       HYDROcodone-acetaminophen (NORCO/VICODIN) 5-325 MG per tablet Take 1 tablet by mouth every 6 (six) hours as needed (For pain.).    0   levothyroxine (SYNTHROID, LEVOTHROID) 75 MCG tablet Take 1 tablet (75 mcg total) by mouth daily. 90 tablet 1   lisinopril (ZESTRIL) 10 MG tablet Take 10 mg by mouth daily.       metoprolol succinate (TOPROL-XL) 100 MG 24 hr tablet Take 100 mg by mouth daily.       omeprazole (PRILOSEC OTC) 20 MG tablet Take 20 mg by mouth 2 (two) times daily as needed (acid reflux).       potassium chloride SA (KLOR-CON) 20 MEQ tablet Take 10-20 tablets by mouth in the morning and at bedtime. Take 1 tablet in the morning and 1/2 tablet in the evening       carvedilol (COREG) 25 MG tablet Take 1 tablet (25 mg total) by mouth 2 (two) times daily. (Patient not taking: Reported on 10/01/2021) 180 tablet 3   lisinopril (ZESTRIL) 20 MG tablet Take 1 tablet (20 mg total) by mouth daily. (Patient not taking: Reported on 10/01/2021) 90 tablet 3  Drug Regimen Review  Drug regimen was reviewed and remains appropriate with no significant issues  identified   Home: Home Living Family/patient expects to be discharged to:: Private residence Living Arrangements: Alone Available Help at Discharge: Family, Available 24 hours/day (2 local daughters to provide 24/7 assist) Type of Home: House Home Access: Stairs to enter Technical brewer of Steps: 1 Entrance Stairs-Rails: Left Home Layout: One level Bathroom Shower/Tub: Multimedia programmer: Standard Bathroom Accessibility: Yes Home Equipment: Conservation officer, nature (2 wheels)  Lives With: Alone   Functional History: Prior Function Prior Level of Function : Independent/Modified Independent Mobility Comments: no AD ADLs Comments: drives   Functional Status:  Mobility: Bed Mobility Overal bed mobility: Needs Assistance Bed Mobility: Supine to Sit, Sit to Supine, Rolling Rolling: Max assist Supine to sit: Max assist, HOB elevated Sit to supine: Max assist General bed mobility comments: rolling performed for cleaning following BM.  Bed pad used to assist with supine to sit and sit to supine Transfers Overall transfer level: Needs assistance Equipment used: None Transfers: Sit to/from Stand Sit to Stand: Max assist General transfer comment: sit to stands performed from eob with max assist of one   ADL: ADL Overall ADL's : Needs assistance/impaired Eating/Feeding: Set up, Sitting Grooming: Minimal assistance, Sitting Grooming Details (indicate cue type and reason): washed face and combed hair sitting on eob Upper Body Bathing: Moderate assistance, Sitting Lower Body Bathing: Maximal assistance, +2 for physical assistance, Sit to/from stand Upper Body Dressing : Maximal assistance, Sitting Lower Body Dressing: Maximal assistance, +2 for physical assistance, Sit to/from stand Toilet Transfer: Maximal assistance, +2 for physical assistance, Stand-pivot Toileting- Clothing Manipulation and Hygiene: Maximal assistance, +2 for physical assistance, Sit to/from  stand Functional mobility during ADLs: Maximal assistance, +2 for physical assistance General ADL Comments: assistance due to RUE limitations   Cognition: Cognition Overall Cognitive Status: Within Functional Limits for tasks assessed Orientation Level: Oriented X4 Cognition Arousal/Alertness: Awake/alert Behavior During Therapy: WFL for tasks assessed/performed Overall Cognitive Status: Within Functional Limits for tasks assessed General Comments: aware of RUE limitations     Blood pressure (!) 161/55, pulse 96, temperature 98.2 F (36.8 C), temperature source Oral, resp. rate 16, height 4\' 8"  (1.422 m), weight 50.9 kg, SpO2 94 %. Physical Exam Constitutional:      General: She is in acute distress.     Appearance: She is ill-appearing.     Comments: Diffuse facial ecchymosis with hematoma right eyebrow. Frail, fatigued appearing with reports of pain all over and wanted to rest. Sling RUE.   HENT:     Head:     Comments: Face with multiple bruises and lacs. Large hematoma over right eye/right frontal area.      Right Ear: External ear normal.     Left Ear: External ear normal.     Mouth/Throat:     Mouth: Mucous membranes are moist.  Eyes:     Extraocular Movements: Extraocular movements intact.     Pupils: Pupils are equal, round, and reactive to light.  Neck:     Comments: bruising Cardiovascular:     Rate and Rhythm: Normal rate and regular rhythm.     Heart sounds: No murmur heard.   No gallop.  Pulmonary:     Effort: Pulmonary effort is normal. No respiratory distress.     Breath sounds: No wheezing.  Abdominal:     General: Bowel sounds are normal. There is no distension.     Tenderness: There is no abdominal tenderness.  Musculoskeletal:        General: Swelling and tenderness (RUE and pelvis) present.  GU: Foley in place Skin:    Comments: Bruising RUE, bilateral LE's. Both legs with dressings in place midway.   Neurological:     Mental Status: She is  oriented to person, place, and time.     Cranial Nerves: No cranial nerve deficit.     Sensory: No sensory deficit.     Comments: Speech clear. Fair insight and awareness. Follows basic commands. Normal language. Motor limited by pain RUE and bilateral LE's. LUE grossly 3-4/5. LE's 2/5 prox to 4/5 distally.   Psychiatric:     Comments: Pt anxious, cooperative.  Mostly just uncomfortable because of her pain      Lab Results Last 48 Hours        Results for orders placed or performed during the hospital encounter of 10/01/21 (from the past 48 hour(s))  Glucose, capillary     Status: Abnormal    Collection Time: 10/04/21 11:39 AM  Result Value Ref Range    Glucose-Capillary 102 (H) 70 - 99 mg/dL      Comment: Glucose reference range applies only to samples taken after fasting for at least 8 hours.  Glucose, capillary     Status: Abnormal    Collection Time: 10/04/21  4:05 PM  Result Value Ref Range    Glucose-Capillary 162 (H) 70 - 99 mg/dL      Comment: Glucose reference range applies only to samples taken after fasting for at least 8 hours.  Glucose, capillary     Status: Abnormal    Collection Time: 10/04/21  8:09 PM  Result Value Ref Range    Glucose-Capillary 144 (H) 70 - 99 mg/dL      Comment: Glucose reference range applies only to samples taken after fasting for at least 8 hours.  Glucose, capillary     Status: Abnormal    Collection Time: 10/05/21 12:05 AM  Result Value Ref Range    Glucose-Capillary 137 (H) 70 - 99 mg/dL      Comment: Glucose reference range applies only to samples taken after fasting for at least 8 hours.  CBC     Status: Abnormal    Collection Time: 10/05/21  3:53 AM  Result Value Ref Range    WBC 13.9 (H) 4.0 - 10.5 K/uL    RBC 2.37 (L) 3.87 - 5.11 MIL/uL    Hemoglobin 7.6 (L) 12.0 - 15.0 g/dL    HCT 23.5 (L) 36.0 - 46.0 %    MCV 99.2 80.0 - 100.0 fL    MCH 32.1 26.0 - 34.0 pg    MCHC 32.3 30.0 - 36.0 g/dL    RDW 15.9 (H) 11.5 - 15.5 %    Platelets  137 (L) 150 - 400 K/uL    nRBC 0.1 0.0 - 0.2 %      Comment: Performed at [facility] [address], [place], [place] [address]  Comprehensive metabolic panel     Status: Abnormal    Collection Time: 10/05/21  3:53 AM  Result Value Ref Range    Sodium 139 135 - 145 mmol/L    Potassium 5.1 3.5 - 5.1 mmol/L    Chloride 112 (H) 98 - 111 mmol/L    CO2 21 (L) 22 - 32 mmol/L    Glucose, Bld 118 (H) 70 - 99 mg/dL      Comment: Glucose reference range applies only to samples taken after fasting  for at least 8 hours.    BUN 72 (H) 8 - 23 mg/dL    Creatinine, Ser 1.59 (H) 0.44 - 1.00 mg/dL    Calcium 7.3 (L) 8.9 - 10.3 mg/dL    Total Protein 4.6 (L) 6.5 - 8.1 g/dL    Albumin 1.9 (L) 3.5 - 5.0 g/dL    AST 48 (H) 15 - 41 U/L    ALT 36 0 - 44 U/L    Alkaline Phosphatase 76 38 - 126 U/L    Total Bilirubin 0.4 0.3 - 1.2 mg/dL    GFR, Estimated 31 (L) >60 mL/min      Comment: (NOTE) Calculated using the CKD-EPI Creatinine Equation (2021)      Anion gap 6 5 - 15      Comment: Performed at [facility], [place] [address], [place], [place] [address]  Glucose, capillary     Status: Abnormal    Collection Time: 10/05/21  3:53 AM  Result Value Ref Range    Glucose-Capillary 100 (H) 70 - 99 mg/dL      Comment: Glucose reference range applies only to samples taken after fasting for at least 8 hours.  Glucose, capillary     Status: None    Collection Time: 10/05/21  8:14 AM  Result Value Ref Range    Glucose-Capillary 94 70 - 99 mg/dL      Comment: Glucose reference range applies only to samples taken after fasting for at least 8 hours.  Glucose, capillary     Status: Abnormal    Collection Time: 10/05/21 11:15 AM  Result Value Ref Range    Glucose-Capillary 116 (H) 70 - 99 mg/dL      Comment: Glucose reference range applies only to samples taken after fasting for at least 8 hours.  Glucose, capillary     Status: Abnormal    Collection Time: 10/05/21  4:08 PM  Result Value Ref Range     Glucose-Capillary 136 (H) 70 - 99 mg/dL      Comment: Glucose reference range applies only to samples taken after fasting for at least 8 hours.  Glucose, capillary     Status: Abnormal    Collection Time: 10/05/21  8:22 PM  Result Value Ref Range    Glucose-Capillary 125 (H) 70 - 99 mg/dL      Comment: Glucose reference range applies only to samples taken after fasting for at least 8 hours.  Glucose, capillary     Status: Abnormal    Collection Time: 10/06/21 12:10 AM  Result Value Ref Range    Glucose-Capillary 102 (H) 70 - 99 mg/dL      Comment: Glucose reference range applies only to samples taken after fasting for at least 8 hours.  Glucose, capillary     Status: None    Collection Time: 10/06/21  4:34 AM  Result Value Ref Range    Glucose-Capillary 79 70 - 99 mg/dL      Comment: Glucose reference range applies only to samples taken after fasting for at least 8 hours.  Glucose, capillary     Status: None    Collection Time: 10/06/21  8:15 AM  Result Value Ref Range    Glucose-Capillary 80 70 - 99 mg/dL      Comment: Glucose reference range applies only to samples taken after fasting for at least 8 hours.  Glucose, capillary     Status: Abnormal    Collection Time: 10/06/21 11:16 AM  Result Value Ref Range  Glucose-Capillary 112 (H) 70 - 99 mg/dL      Comment: Glucose reference range applies only to samples taken after fasting for at least 8 hours.       Imaging Results (Last 48 hours)  US RENAL   Result Date: 10/04/2021 CLINICAL DATA:  Acute kidney injury EXAM: RENAL / URINARY TRACT ULTRASOUND COMPLETE COMPARISON:  CT 10/01/2021 FINDINGS: Right Kidney: Renal measurements: 7.9 x 4.6 x 4.1 cm = volume: 61 mL. Cortex is echogenic. Diffuse cortical thinning consistent with atrophy. No hydronephrosis or mass. Left Kidney: Renal measurements: 9.5 x 4.8 x 4.5 cm = volume: 107.4 mL. Cortex slightly echogenic. Diffuse cortical thinning. No mass or hydronephrosis Bladder: Decompressed  by Foley catheter Other: None. IMPRESSION: 1. Slightly echogenic kidneys with diffuse cortical thinning/atrophy consistent with medical renal disease. 2. Negative for hydronephrosis Electronically Signed   By: Donavan Foil M.D.   On: 10/04/2021 16:52             Medical Problem List and Plan: 1.  Functional deficits secondary to polytrauma including right proximal humerus fx, pelvic fx's             -patient may shower             -ELOS/Goals: 10-12 days, supervision to min assist goals 2.  Antithrombotics: -DVT/anticoagulation:  Pharmaceutical: Lovenox--Eliquis has been on hold             -antiplatelet therapy: N/A 3. Chronic LBP/compression Fx/Pain Management:  Was on hydrocodone prn PTA. --Oxycodone prn--discontinue IV dilaudid.  -add scheduled robaxin as well, observe for excessive sedation 4. Mood: LCSW to follow for evaluation and support.              -antipsychotic agents: N/A 5. Neuropsych: This patient is capable of making decisions on her own behalf. 6. Skin/Wound Care: Monitor wound for healing. Routine pressure relief measures.  7. Fluids/Electrolytes/Nutrition: Strict I/O. Check CMET in am.  8. Right humerus fracture: NWB RUE with sling at all times --may doff for ADL 9. Pelvic Fx: WBAT BLE 10. Acute on chronic renal failure with hyperkalemia: 11. Polymyalgia rheumatica: Followed by [facility] on medrol 2mg /day PTA.  --On Medrol 4 mg/day since admission 12. PAF: Monitor HR TID--Monitor HR TID. Continue coreg bid.  13. Leucocytosis: Monitor for signs of infection. Likely steroid effect.  14. Rectal pain/Constipation: Miralax added to Senna today.              --likely needs disimpaction as reporting rectal spasms.   -suppository/enema             -also related to fractures themselves and associated skeletal muscle spasms. 15. Hypothyroid  On supplement  16. Urinary retention: Will keep foley for a few more days until constipation addressed.     Bary Leriche,  PA-C 10/06/2021   I have personally performed a face to face diagnostic evaluation of this patient and formulated the key components of the plan.  Additionally, I have personally reviewed laboratory data, imaging studies, as well as relevant notes and concur with the physician assistant's documentation above.  The patient's status has not changed from the original H&P.  Any changes in documentation from the acute care chart have been noted above.  Meredith Staggers, MD, [facility]

## 2021-10-06 NOTE — Consult Note (Signed)
   [facility] Inpatient Consult   10/06/2021  FORREST DEMURO 04-Jan-1932 [id number]  [facility] [ACO] Patient: Medicare CMS DCE  Primary Care Provider:  Lawerance Cruel, MD [facility]ician, [facility], Embedded   Patient screened for post hospitalization transition with noted extreme high risk score for unplanned readmission risk.  Reviewed for  potential [facility] service needs for post hospital transition.  Review of patient's medical record reveals patient is being recommended for an inpatient rehabilitation level of care. Came by to see patient currently in patient care will likely transition to inpatient CIR at [facility] noted.   Plan:  Continue to follow progress and disposition to assess for post hospital care management needs.   For questions contact:   Natividad Brood, RN BSN [facility] Liaison  [phone] business mobile phone Toll free office [phone]  Fax number: [phone] [email or web address]

## 2021-10-06 NOTE — Progress Notes (Signed)
Inpatient Rehabilitation Admission Medication Review by a Pharmacist  A complete drug regimen review was completed for this patient to identify any potential clinically significant medication issues.  High Risk Drug Classes Is patient taking? Indication by Medication  Antipsychotic No   Anticoagulant Yes Lovenox- VTE prophylaxis  Antibiotic Yes Cefepime- UTI- end 10/07/21  Opioid Yes Oxycodone IR- pain control  Antiplatelet No   Hypoglycemics/insulin No   Vasoactive Medication Yes Norvasc/Coreg/- HTN, HFpEF  Chemotherapy No   Other No      Type of Medication Issue Identified Description of Issue Recommendation(s)  Drug Interaction(s) (clinically significant)     Duplicate Therapy     Allergy     No Medication Administration End Date     Incorrect Dose     Additional Drug Therapy Needed     Significant med changes from prior encounter (inform family/care partners about these prior to discharge).    Other  Meds prior to admission not continued on transfer to rehab- apixaban, aspirin, Vitamin D, Lasix, Lisinopril, Toprol XL, KCL tabs  Medication doses changed- Toprol XL changed to lopressor 12.5 mg po q6h prn, coreg dose reduced from 25 mg PTA to 6.25 mg during prior admission, Norco for pain PTA changed to oxycodone IR on prior admission  Restart/adjust doses of medications stopped or changed during prior admission (admit date 09/30/2021) as clinically appropriate or document reasoning for discontinuation/changes      Clinically significant medication issues were identified that warrant physician communication and completion of prescribed/recommended actions by midnight of the next day:  No  Pharmacist comments: Discharge summary from Dr. Darrick Meigs documents to consider restarting Apixaban if hemoglobin remains stable.  Time spent performing this drug regimen review (minutes):  60    [name] BS, PharmD, BCPS Clinical Pharmacist  10/06/2021 6:45 PM

## 2021-10-06 NOTE — Discharge Summary (Signed)
Physician Discharge Summary  [name] TOI:[id number] DOB: August 03, 1932 DOA: 10/01/2021  PCP: Lawerance Cruel, MD  Admit date: 10/01/2021 Discharge date: 10/06/2021  Time spent: 60 minutes  Recommendations for Outpatient Follow-up:  Patient to be transferred to inpatient rehab Continue Foley catheter for urinary retention Continue cefepime for 1 more day to complete 5 days of treatment Eliquis on hold, consider restarting if hemoglobin remained stable   Discharge Diagnoses:  Active Problems:   Fall at home, initial encounter   Fall   Old age   Discharge Condition: Stable  Diet recommendation: Heart healthy diet  Filed Weights   10/03/21 0100  Weight: 50.9 kg    History of present illness:  87 year old female with medical history of PAF on Eliquis started recently, nonischemic cardiomyopathy s/p CRT-D, on Lasix, mitral stenosis status postrepair, AAA s/p PPM, hypertension, CKD stage III, chronic iron deficiency anemia, polymyalgia rheumatica on chronic steroid, chronic back pain on narcotics, presented with fall likely in setting of mechanical fall In the ED, multiple scan was done which showed transverse fracture of proximal humeral neck and fracture greater tuberosity.  Patient was scheduled to see orthopedic surgery this coming Tuesday for right arm fracture.  Patient was reassured and sent home. -Overnight patient felt that pain was not controlled so she came to ED. Patient was rescanned in the ED and was found to have right pubic ramus as well as superior pubic ramus fracture.  Potassium was 5.8 hemoglobin was 6.8 so she was  admitted for further work-up.    Hospital Course:   Acute blood loss anemia -Secondary to multiple hematomas menorrhagia body including right forehead, right arm fracture, pelvic fracture right leg wound -S/p 1 unit PRBC -Hemoglobin is 7.6, will transfuse for hemoglobin less than 7 -FOBT is negative -Eliquis on hold -Started on heparin for  DVT prophylaxis   Right humeral neck fracture, right pubic ramus and right supra pubis fracture -Admitting physician D/W on call ortho Dr. Victorino December, both the right arm and right hip fractures are non-operatable and command right arm sling 3 weeks and right hip weightbearing as tolerated and physical therapy. -Orthopedic consult greatly appreciated, she was seen by Silvestre Gunner, regarding further recommendation for ambulatory status with PT -She may be weightbearing as tolerated to the lower extremities nonweightbearing to the upper extremity.   UTI -Urine culture growing 30,000 colonies/ml of Enterococcus faecalis -Patient was empirically started on cefepime -WBC count is down to 13,000    Status post fall -Cannot recall exact events but unlikely that patient had syncope or presyncope -She thinks she tripped on the rug -PPM interrogation -2D echo was done which showed no significant valvular disease, preserved EF   Chronic diastolic CHF -Euvolemic -Lasix on hold   Acute kidney injury on CKD stage IIIb -Creatinine was elevated to 1.96, improved to 1.59 today  -Lasix has been held -ACE inhibitor's are on hold -Renal ultrasound shows no hydronephrosis -Follow renal function in a.m.   Hypertension -Blood pressure is mildly elevated -Restart Coreg at lower dose of 6.25 mg p.o. twice daily, amlodipine 2.5 mg daily -Continue to hold ACE inhibitor   Urinary retention -Unclear etiology, Foley catheter inserted -Renal ultrasound ordered   Right leg calf skin ulceration -Wound care consulted   Paroxysmal atrial fibrillation -Coreg low-dose started as above -Eliquis on hold due to bleeding as above   Hypothyroidism -Continue Synthroid   Polymyalgia rheumatica -Patient takes Medrol 2 mg daily at home -Medrol dose was changed to 4 mg  daily for stress dose -We will change back Medrol to 2 mg p.o. daily -Continue Protonix 40 mg daily  Constipation -We will add MiraLAX 17  g p.o. daily  Procedures:   Consultations:   Discharge Exam: Vitals:   10/06/21 0430 10/06/21 0821  BP: (!) 175/54 (!) 161/55  Pulse: 93 96  Resp: 16 16  Temp: 98.5 F (36.9 C) 98.2 F (36.8 C)  SpO2: 96% 94%    General: Appears in no acute distress Cardiovascular: S1-S2, regular, no murmur auscultated Respiratory: Clear to auscultation bilaterally  Discharge Instructions   Discharge Instructions     Diet - low sodium heart healthy   Complete by: As directed    Discharge wound care:   Complete by: As directed    Cleans skin tears right arm, right leg with NS and pat dry.  Apply Xeroform gauze to wounds and covered with ABD pads and Kerlix/tape.  Change daily.   Increase activity slowly   Complete by: As directed       Allergies as of 10/06/2021       Reactions   Pertussis Vaccines Swelling   Sulfa Antibiotics Nausea Only   Amoxicillin Rash, Other (See Comments)   Has patient had a PCN reaction causing immediate rash, facial/tongue/throat swelling, SOB or lightheadedness with hypotension: yes Has patient had a PCN reaction causing severe rash involving mucus membranes or skin necrosis: no Has patient had a PCN reaction that required hospitalization no Has patient had a PCN reaction occurring within the last 10 years: yes If all of the above answers are "NO", then may proceed with Cephalosporin use.        Medication List     STOP taking these medications    apixaban 2.5 MG Tabs tablet Commonly known as: ELIQUIS   furosemide 40 MG tablet Commonly known as: LASIX   HYDROcodone-acetaminophen 5-325 MG tablet Commonly known as: NORCO/VICODIN   Lokelma 5 g packet Generic drug: sodium zirconium cyclosilicate   metoprolol succinate 100 MG 24 hr tablet Commonly known as: TOPROL-XL   potassium chloride SA 20 MEQ tablet Commonly known as: KLOR-CON   Vitamin D3 25 MCG (1000 UT) Caps       TAKE these medications    allopurinol 100 MG  tablet Commonly known as: ZYLOPRIM Take 100 mg by mouth daily.   amLODipine 2.5 MG tablet Commonly known as: NORVASC Take 2.5 mg by mouth daily.   aspirin EC 81 MG tablet Take 81 mg by mouth daily. Swallow whole.   atorvastatin 40 MG tablet Commonly known as: LIPITOR Take 1 tablet (40 mg total) by mouth daily. What changed: Another medication with the same name was removed. Continue taking this medication, and follow the directions you see here.   carvedilol 6.25 MG tablet Commonly known as: COREG Take 1 tablet (6.25 mg total) by mouth 2 (two) times daily with a meal. What changed:  medication strength how much to take when to take this   ceFEPIme 2 g in sodium chloride 0.9 % 100 mL Inject 2 g into the vein daily for 1 day.   heparin 5000 UNIT/ML injection Inject 1 mL (5,000 Units total) into the skin every 12 (twelve) hours.   hydrALAZINE 25 MG tablet Commonly known as: APRESOLINE Take 1 tablet (25 mg total) by mouth every 6 (six) hours as needed (SBP>150 or DBP>100).   levothyroxine 75 MCG tablet Commonly known as: SYNTHROID Take 1 tablet (75 mcg total) by mouth daily.   lisinopril 10 MG  tablet Commonly known as: ZESTRIL Take 10 mg by mouth daily. What changed: Another medication with the same name was removed. Continue taking this medication, and follow the directions you see here.   methylPREDNISolone 2 MG tablet Commonly known as: MEDROL Take 1 tablet (2 mg total) by mouth daily.   omeprazole 20 MG tablet Commonly known as: PRILOSEC OTC Take 20 mg by mouth 2 (two) times daily as needed (acid reflux).   oxyCODONE 5 MG immediate release tablet Commonly known as: Oxy IR/ROXICODONE Take 1 tablet (5 mg total) by mouth every 6 (six) hours as needed for severe pain.   polyethylene glycol 17 g packet Commonly known as: MIRALAX / GLYCOLAX Take 17 g by mouth daily.               Discharge Care Instructions  (From admission, onward)           Start      Ordered   10/06/21 0000  Discharge wound care:       Comments: Cleans skin tears right arm, right leg with NS and pat dry.  Apply Xeroform gauze to wounds and covered with ABD pads and Kerlix/tape.  Change daily.   10/06/21 1041           Allergies  Allergen Reactions   Pertussis Vaccines Swelling   Sulfa Antibiotics Nausea Only   Amoxicillin Rash and Other (See Comments)    Has patient had a PCN reaction causing immediate rash, facial/tongue/throat swelling, SOB or lightheadedness with hypotension: yes Has patient had a PCN reaction causing severe rash involving mucus membranes or skin necrosis: no Has patient had a PCN reaction that required hospitalization no Has patient had a PCN reaction occurring within the last 10 years: yes If all of the above answers are "NO", then may proceed with Cephalosporin use.       The results of significant diagnostics from this hospitalization (including imaging, microbiology, ancillary and laboratory) are listed below for reference.    Significant Diagnostic Studies: DG Elbow Complete Right  Result Date: 09/30/2021 CLINICAL DATA:  Fall EXAM: RIGHT ELBOW - COMPLETE 3+ VIEW COMPARISON:  None. FINDINGS: There is no evidence of fracture, dislocation, or joint effusion. There is no evidence of arthropathy or other focal bone abnormality. Soft tissues are unremarkable. IMPRESSION: Negative. Electronically Signed   By: Franchot Gallo M.D.   On: 09/30/2021 17:20   CT HEAD WO CONTRAST (5MM)  Result Date: 10/01/2021 CLINICAL DATA:  Poly trauma.  Post fall. EXAM: CT HEAD WITHOUT CONTRAST CT CERVICAL SPINE WITHOUT CONTRAST TECHNIQUE: Multidetector CT imaging of the head and cervical spine was performed following the standard protocol without intravenous contrast. Multiplanar CT image reconstructions of the cervical spine were also generated. COMPARISON:  None. FINDINGS: CT HEAD FINDINGS Brain: No evidence of acute infarction, hemorrhage, hydrocephalus,  extra-axial collection or mass lesion/mass effect. Moderate brain parenchymal volume and microangiopathy. Vascular: No hyperdense vessel or unexpected calcification. Skull: Normal. Negative for fracture or focal lesion. Sinuses/Orbits: No acute finding. Other: Right frontal scalp hematoma. CT CERVICAL SPINE FINDINGS Alignment: 1-2 mm the anterolisthesis of C4 on C5 and 1-2 mm posterior listhesis of C5 on C6, likely due to degenerative facet arthropathy. Skull base and vertebrae: No acute fracture. No primary bone lesion or focal pathologic process. Soft tissues and spinal canal: No prevertebral fluid or swelling. No visible canal hematoma. Disc levels:  Multilevel osteoarthritic changes. Upper chest: Negative. Other: None. IMPRESSION: 1. No acute intracranial abnormality. 2. Moderate brain parenchymal atrophy  and microangiopathy. 3. No evidence of acute traumatic injury to cervical spine. 4. Multilevel osteoarthritic changes of the cervical spine. 5. 1-2 mm anterolisthesis of C4 on C5 and 1-2 mm posterior listhesis of C5 on C6, likely due to degenerative facet arthropathy. Electronically Signed   By: Fidela Salisbury M.D.   On: 10/01/2021 14:44   CT HEAD WO CONTRAST  Result Date: 09/30/2021 CLINICAL DATA:  Facial trauma with right orbital bruising. EXAM: CT HEAD WITHOUT CONTRAST CT MAXILLOFACIAL WITHOUT CONTRAST CT CERVICAL SPINE WITHOUT CONTRAST TECHNIQUE: Multidetector CT imaging of the head, cervical spine, and maxillofacial structures were performed using the standard protocol without intravenous contrast. Multiplanar CT image reconstructions of the cervical spine and maxillofacial structures were also generated. COMPARISON:  CT scan of the brain June 14, 2015 FINDINGS: CT HEAD FINDINGS Brain: No evidence of acute infarction, hemorrhage, hydrocephalus, extra-axial collection or mass lesion/mass effect. Vascular: Calcified atherosclerosis in the intracranial carotids. Skull: Normal. Negative for fracture  or focal lesion. Other: Hematoma and soft tissue swelling in the right supraorbital region. Air in the soft tissues suggests laceration. CT MAXILLOFACIAL FINDINGS Osseous: No fracture or mandibular dislocation. No destructive process. Orbits: Negative. No traumatic or inflammatory finding. Sinuses: Clear. Soft tissues: Soft tissue swelling and hematoma in the right Peri orbital and supraorbital region. Air in the soft tissues consistent with laceration. The underlying globe is in tact. No other soft tissue abnormalities are identified. CT CERVICAL SPINE FINDINGS Alignment: Trace retrolisthesis of C2 versus C3, not seen in 2016. Anterolisthesis of C4 versus C5, unchanged since 2016. Reversal of normal lordosis centered at C5-C6, unchanged since 2016. No other significant malalignment. Skull base and vertebrae: No acute fracture. No primary bone lesion or focal pathologic process. Soft tissues and spinal canal: No prevertebral soft tissue swelling. No other soft tissue abnormalities. Disc levels: Multilevel degenerative disc disease. Facet degenerative changes. Upper chest: Scarring in the apices. No acute abnormalities in the upper lungs. Other: Fracture of the proximal right humerus seen on the scout view. No other abnormalities. IMPRESSION: 1. No acute intracranial abnormalities. 2. Hematoma and soft tissue swelling in the right supraorbital region. No facial bone fractures. 3. Trace retrolisthesis of C2 versus C3, anterolisthesis of C4 versus C5, and reversal of normal lordosis at C5-6 is favored to be nonacute with no adjacent soft tissue swelling. The findings at C4-5 and C5-6 are unchanged since 2016. Recommend clinical correlation. 4. No fracture in the cervical spine. 5. Fracture of the proximal right humerus, better assessed on the dedicated right shoulder x-ray. Electronically Signed   By: Dorise Bullion III M.D.   On: 09/30/2021 17:22   CT Cervical Spine Wo Contrast  Result Date: 10/01/2021 CLINICAL  DATA:  Poly trauma.  Post fall. EXAM: CT HEAD WITHOUT CONTRAST CT CERVICAL SPINE WITHOUT CONTRAST TECHNIQUE: Multidetector CT imaging of the head and cervical spine was performed following the standard protocol without intravenous contrast. Multiplanar CT image reconstructions of the cervical spine were also generated. COMPARISON:  None. FINDINGS: CT HEAD FINDINGS Brain: No evidence of acute infarction, hemorrhage, hydrocephalus, extra-axial collection or mass lesion/mass effect. Moderate brain parenchymal volume and microangiopathy. Vascular: No hyperdense vessel or unexpected calcification. Skull: Normal. Negative for fracture or focal lesion. Sinuses/Orbits: No acute finding. Other: Right frontal scalp hematoma. CT CERVICAL SPINE FINDINGS Alignment: 1-2 mm the anterolisthesis of C4 on C5 and 1-2 mm posterior listhesis of C5 on C6, likely due to degenerative facet arthropathy. Skull base and vertebrae: No acute fracture. No primary bone lesion or  focal pathologic process. Soft tissues and spinal canal: No prevertebral fluid or swelling. No visible canal hematoma. Disc levels:  Multilevel osteoarthritic changes. Upper chest: Negative. Other: None. IMPRESSION: 1. No acute intracranial abnormality. 2. Moderate brain parenchymal atrophy and microangiopathy. 3. No evidence of acute traumatic injury to cervical spine. 4. Multilevel osteoarthritic changes of the cervical spine. 5. 1-2 mm anterolisthesis of C4 on C5 and 1-2 mm posterior listhesis of C5 on C6, likely due to degenerative facet arthropathy. Electronically Signed   By: Fidela Salisbury M.D.   On: 10/01/2021 14:44   CT CERVICAL SPINE WO CONTRAST  Result Date: 09/30/2021 CLINICAL DATA:  Facial trauma with right orbital bruising. EXAM: CT HEAD WITHOUT CONTRAST CT MAXILLOFACIAL WITHOUT CONTRAST CT CERVICAL SPINE WITHOUT CONTRAST TECHNIQUE: Multidetector CT imaging of the head, cervical spine, and maxillofacial structures were performed using the standard  protocol without intravenous contrast. Multiplanar CT image reconstructions of the cervical spine and maxillofacial structures were also generated. COMPARISON:  CT scan of the brain June 14, 2015 FINDINGS: CT HEAD FINDINGS Brain: No evidence of acute infarction, hemorrhage, hydrocephalus, extra-axial collection or mass lesion/mass effect. Vascular: Calcified atherosclerosis in the intracranial carotids. Skull: Normal. Negative for fracture or focal lesion. Other: Hematoma and soft tissue swelling in the right supraorbital region. Air in the soft tissues suggests laceration. CT MAXILLOFACIAL FINDINGS Osseous: No fracture or mandibular dislocation. No destructive process. Orbits: Negative. No traumatic or inflammatory finding. Sinuses: Clear. Soft tissues: Soft tissue swelling and hematoma in the right Peri orbital and supraorbital region. Air in the soft tissues consistent with laceration. The underlying globe is in tact. No other soft tissue abnormalities are identified. CT CERVICAL SPINE FINDINGS Alignment: Trace retrolisthesis of C2 versus C3, not seen in 2016. Anterolisthesis of C4 versus C5, unchanged since 2016. Reversal of normal lordosis centered at C5-C6, unchanged since 2016. No other significant malalignment. Skull base and vertebrae: No acute fracture. No primary bone lesion or focal pathologic process. Soft tissues and spinal canal: No prevertebral soft tissue swelling. No other soft tissue abnormalities. Disc levels: Multilevel degenerative disc disease. Facet degenerative changes. Upper chest: Scarring in the apices. No acute abnormalities in the upper lungs. Other: Fracture of the proximal right humerus seen on the scout view. No other abnormalities. IMPRESSION: 1. No acute intracranial abnormalities. 2. Hematoma and soft tissue swelling in the right supraorbital region. No facial bone fractures. 3. Trace retrolisthesis of C2 versus C3, anterolisthesis of C4 versus C5, and reversal of normal lordosis  at C5-6 is favored to be nonacute with no adjacent soft tissue swelling. The findings at C4-5 and C5-6 are unchanged since 2016. Recommend clinical correlation. 4. No fracture in the cervical spine. 5. Fracture of the proximal right humerus, better assessed on the dedicated right shoulder x-ray. Electronically Signed   By: Dorise Bullion III M.D.   On: 09/30/2021 17:22   US RENAL  Result Date: 10/04/2021 CLINICAL DATA:  Acute kidney injury EXAM: RENAL / URINARY TRACT ULTRASOUND COMPLETE COMPARISON:  CT 10/01/2021 FINDINGS: Right Kidney: Renal measurements: 7.9 x 4.6 x 4.1 cm = volume: 61 mL. Cortex is echogenic. Diffuse cortical thinning consistent with atrophy. No hydronephrosis or mass. Left Kidney: Renal measurements: 9.5 x 4.8 x 4.5 cm = volume: 107.4 mL. Cortex slightly echogenic. Diffuse cortical thinning. No mass or hydronephrosis Bladder: Decompressed by Foley catheter Other: None. IMPRESSION: 1. Slightly echogenic kidneys with diffuse cortical thinning/atrophy consistent with medical renal disease. 2. Negative for hydronephrosis Electronically Signed   By: Donavan Foil  M.D.   On: 10/04/2021 16:52   CT CHEST ABDOMEN PELVIS W CONTRAST  Result Date: 10/01/2021 CLINICAL DATA:  Fall down stairs. Blunt trauma. Right-sided chest and abdominal pain. Initial encounter. EXAM: CT CHEST, ABDOMEN, AND PELVIS WITH CONTRAST TECHNIQUE: Multidetector CT imaging of the chest, abdomen and pelvis was performed following the standard protocol during bolus administration of intravenous contrast. CONTRAST:  21mL OMNIPAQUE IOHEXOL 350 MG/ML SOLN COMPARISON:  None. FINDINGS: CT CHEST FINDINGS Cardiovascular: No evidence of thoracic aortic injury or mediastinal hematoma. No pericardial effusion. Mild-to-moderate cardiomegaly. Pacemaker leads are seen in expected position, and prosthetic mitral valve also noted. Mediastinum/Nodes: No evidence of hemorrhage or pneumomediastinum. No masses or pathologically enlarged lymph  nodes identified. Lungs/Pleura: No evidence of pulmonary contusion or other infiltrate. No evidence of pneumothorax or hemothorax. Musculoskeletal: No acute fractures or suspicious bone lesions identified. Old compression fractures and multiple vertebroplasties are noted in the lower thoracic spine. CT ABDOMEN PELVIS FINDINGS Hepatobiliary: No hepatic laceration or mass identified. Mild diffuse hepatic steatosis is noted. Gallbladder is unremarkable. No evidence of biliary ductal dilatation. Pancreas: No parenchymal laceration, mass, or inflammatory changes identified. Spleen: No evidence of splenic laceration. Adrenal/Urinary Tract: No hemorrhage or parenchymal lacerations identified. No evidence of mass or hydronephrosis. Unremarkable unopacified urinary bladder. Stomach/Bowel: Unopacified bowel loops are unremarkable in appearance. No evidence of hemoperitoneum. Vascular/Lymphatic: No evidence of abdominal aortic injury or retroperitoneal hemorrhage. Aortic atherosclerotic calcification noted. No pathologically enlarged lymph nodes identified. Reproductive: Prior hysterectomy noted. Adnexal regions are unremarkable in appearance. Other: A small right inguinal hernia is seen which contains a loop of small bowel. No evidence of bowel obstruction or strangulation. Musculoskeletal: A nondisplaced fracture is seen involving the right pubis and superior pubic ramus. Multiple previous lumbar vertebroplasties noted. IMPRESSION: Nondisplaced fracture of right pubis and superior pubic ramus. No evidence of internal organ injury. Small right inguinal hernia containing a loop of small bowel. No evidence of bowel obstruction or strangulation. Mild hepatic steatosis. Aortic Atherosclerosis (ICD10-I70.0). Electronically Signed   By: Marlaine Hind M.D.   On: 10/01/2021 14:53   CT T-SPINE NO CHARGE  Result Date: 10/01/2021 CLINICAL DATA:  Pain.  Fall today. EXAM: CT THORACIC AND LUMBAR SPINE WITH CONTRAST TECHNIQUE:  Multiplanar CT images of the thoracic and lumbar spine were reconstructed from contemporary CT of the Chest, Abdomen, and Pelvis CONTRAST:  No additional COMPARISON:  Chest radiographs 04/21/2019. Lumbar spine radiographs 04/08/2013. Lumbar spine MRI 03/05/2007. FINDINGS: CT THORACIC SPINE FINDINGS Alignment: Chronically exaggerated thoracic kyphosis. No significant listhesis in the thoracic spine. Vertebrae: Chronic, previously augmented T10, T11, and T12 compression fractures. Chronic, mild T4, mild-to-moderate T5, and moderate T8 and T9 compression fractures, all similar to the 2020 chest radiographs. Schmorl's nodes at T2, T3, T6, and T7. No definite acute fracture or suspicious osseous lesion. Paraspinal and other soft tissues: No acute abnormality identified in the paraspinal soft tissues. Intrathoracic contents reported separately. Disc levels: Mild thoracic spondylosis without evidence of high-grade stenosis. CT LUMBAR SPINE FINDINGS Segmentation: 5 lumbar type vertebrae. Alignment: Normal. Vertebrae: Chronic, previously augmented L1 and L4 compression fractures. Preserved heights of the other lumbar vertebral bodies without an acute fracture or suspicious osseous lesion identified. Paraspinal and other soft tissues: No acute abnormality identified in the paraspinal soft tissues. Intra-abdominal and pelvic contents reported separately. Disc levels: Mild bilateral neural foraminal stenosis at L3-4 and L4-5 due to disc bulging and mild facet arthrosis. No high-grade spinal stenosis. IMPRESSION: 1. No evidence of acute osseous abnormality in the thoracic or  lumbar spine. 2. Numerous chronic compression fractures. Electronically Signed   By: Logan Bores M.D.   On: 10/01/2021 14:28   CT L-SPINE NO CHARGE  Result Date: 10/01/2021 CLINICAL DATA:  Pain.  Fall today. EXAM: CT THORACIC AND LUMBAR SPINE WITH CONTRAST TECHNIQUE: Multiplanar CT images of the thoracic and lumbar spine were reconstructed from  contemporary CT of the Chest, Abdomen, and Pelvis CONTRAST:  No additional COMPARISON:  Chest radiographs 04/21/2019. Lumbar spine radiographs 04/08/2013. Lumbar spine MRI 03/05/2007. FINDINGS: CT THORACIC SPINE FINDINGS Alignment: Chronically exaggerated thoracic kyphosis. No significant listhesis in the thoracic spine. Vertebrae: Chronic, previously augmented T10, T11, and T12 compression fractures. Chronic, mild T4, mild-to-moderate T5, and moderate T8 and T9 compression fractures, all similar to the 2020 chest radiographs. Schmorl's nodes at T2, T3, T6, and T7. No definite acute fracture or suspicious osseous lesion. Paraspinal and other soft tissues: No acute abnormality identified in the paraspinal soft tissues. Intrathoracic contents reported separately. Disc levels: Mild thoracic spondylosis without evidence of high-grade stenosis. CT LUMBAR SPINE FINDINGS Segmentation: 5 lumbar type vertebrae. Alignment: Normal. Vertebrae: Chronic, previously augmented L1 and L4 compression fractures. Preserved heights of the other lumbar vertebral bodies without an acute fracture or suspicious osseous lesion identified. Paraspinal and other soft tissues: No acute abnormality identified in the paraspinal soft tissues. Intra-abdominal and pelvic contents reported separately. Disc levels: Mild bilateral neural foraminal stenosis at L3-4 and L4-5 due to disc bulging and mild facet arthrosis. No high-grade spinal stenosis. IMPRESSION: 1. No evidence of acute osseous abnormality in the thoracic or lumbar spine. 2. Numerous chronic compression fractures. Electronically Signed   By: Logan Bores M.D.   On: 10/01/2021 14:28   DG Chest Portable 1 View  Result Date: 10/01/2021 CLINICAL DATA:  Fall.  Nausea.  Oliguria.  Bruising.  Pain. EXAM: PORTABLE CHEST 1 VIEW COMPARISON:  Chest radiograph 04/21/2019 FINDINGS: AICD noted. Mitral valve prosthesis. The patient is rotated to the left on today's radiograph, reducing diagnostic  sensitivity and specificity. Atherosclerotic calcification of the aortic arch. Lower thoracic vertebral augmentations. Bony demineralization. The visualized lungs appear clear, part of the left lung base is obscured by the pulse generator. Mild enlargement of the cardiopericardial silhouette. Discontinuity in the proximal right humerus compatible with fracture. IMPRESSION: 1. No acute findings. 2. Mild enlargement of the cardiopericardial silhouette. 3. AICD and mitral valve prosthesis noted. 4. Fracture of the right proximal humerus partially included on today's exam. Electronically Signed   By: Van Clines M.D.   On: 10/01/2021 12:35   DG Shoulder Right Port  Result Date: 09/30/2021 CLINICAL DATA:  Fall EXAM: PORTABLE RIGHT SHOULDER COMPARISON:  None. FINDINGS: Transverse fracture of the humeral neck. Fracture of the greater tuberosity. No dislocation. Multiple thoracic fractures with vertebroplasty cement. IMPRESSION: Fracture humeral neck and greater tuberosity Electronically Signed   By: Franchot Gallo M.D.   On: 09/30/2021 17:21   DG Tibia/Fibula Right Port  Result Date: 10/01/2021 CLINICAL DATA:  Fall yesterday. Worsening pain. Laceration along the anterior shin. EXAM: PORTABLE RIGHT TIBIA AND FIBULA - 2 VIEW COMPARISON:  Knee radiographs from 08/12/2015 FINDINGS: Vascular calcifications noted. Diffuse subcutaneous edema. No tibial or fibular fracture is identified. Lucency along the soft tissues the anterior shin probably reflecting laceration. IMPRESSION: 1. Laceration along the anterior shin, without underlying fracture. 2. Vascular calcifications. 3. Subcutaneous edema diffusely along the calf. Electronically Signed   By: Van Clines M.D.   On: 10/01/2021 12:37   DG Humerus Right  Result Date: 09/30/2021 CLINICAL  DATA:  Fall EXAM: RIGHT HUMERUS - 2+ VIEW COMPARISON:  None. FINDINGS: Fracture right humeral neck with mild displacement. Fracture extends into the greater tuberosity  of the humerus. Normal alignment of the humeral head. IMPRESSION: Transverse fracture proximal humeral neck. Fracture greater tuberosity. Electronically Signed   By: Franchot Gallo M.D.   On: 09/30/2021 17:18   ECHOCARDIOGRAM COMPLETE  Result Date: 10/02/2021    ECHOCARDIOGRAM REPORT   Patient Name:   TASHALA CUMBO Date of Exam: 10/02/2021 Medical Rec #:  [id number]      Height:       56.0 in Accession #:    [id number]     Weight:       96.8 lb Date of Birth:  09-06-1932       BSA:          1.304 m Patient Age:    29 years       BP:           120/44 mmHg Patient Gender: F              HR:           88 bpm. Exam Location:  Inpatient Procedure: 2D Echo, Color Doppler, Cardiac Doppler and Intracardiac            Opacification Agent Indications:    fall  History:        Patient has no prior history of Echocardiogram examinations. CHF                 and Cardiomyopathy, Arrythmias:Atrial Fibrillation; Risk                 Factors:Dyslipidemia and Hypertension.                  Mitral Valve: valve is present in the mitral position.  Sonographer:    Ula Lingo RDCS (AE, PE) Referring Phys: [id number] Lequita Halt IMPRESSIONS  1. Left ventricular ejection fraction, by estimation, is 55 to 60%. The left ventricle has normal function. The left ventricle has no regional wall motion abnormalities. Left ventricular diastolic parameters are indeterminate.  2. Pacing wires in RA/RV. Right ventricular systolic function is normal. The right ventricular size is normal.  3. The aortic valve is tricuspid. Aortic valve regurgitation is not visualized. No aortic stenosis is present.  4. The inferior vena cava is normal in size with greater than 50% respiratory variability, suggesting right atrial pressure of 3 mmHg.  5. Left atrial size was moderately dilated. FINDINGS  Left Ventricle: Left ventricular ejection fraction, by estimation, is 55 to 60%. The left ventricle has normal function. The left ventricle has no regional wall  motion abnormalities. The left ventricular internal cavity size was normal in size. There is  no left ventricular hypertrophy. Left ventricular diastolic parameters are indeterminate. Right Ventricle: Pacing wires in RA/RV. The right ventricular size is normal. No increase in right ventricular wall thickness. Right ventricular systolic function is normal. Left Atrium: Left atrial size was moderately dilated. Right Atrium: Right atrial size was normal in size. Pericardium: There is no evidence of pericardial effusion. Mitral Valve: No details provided by tech. MV appears to have been repaired with annuloplasty ring no significant residual MR Mean diastolic gradient at HR 86 bpm only 3 mmHg. The mitral valve has been repaired/replaced. No evidence of mitral valve regurgitation. There is a present in the mitral position. No evidence of mitral valve stenosis. MV peak gradient, 9.1 mmHg. The mean mitral  valve gradient is 3.0 mmHg. Tricuspid Valve: The tricuspid valve is normal in structure. Tricuspid valve regurgitation is mild . No evidence of tricuspid stenosis. Aortic Valve: The aortic valve is tricuspid. Aortic valve regurgitation is not visualized. No aortic stenosis is present. Pulmonic Valve: The pulmonic valve was normal in structure. Pulmonic valve regurgitation is not visualized. No evidence of pulmonic stenosis. Aorta: The aortic root is normal in size and structure. Venous: The inferior vena cava is normal in size with greater than 50% respiratory variability, suggesting right atrial pressure of 3 mmHg. IAS/Shunts: No atrial level shunt detected by color flow Doppler.  LEFT VENTRICLE PLAX 2D LVIDd:         4.50 cm LVIDs:         3.00 cm LV PW:         0.90 cm LV IVS:        0.80 cm LVOT diam:     1.90 cm LV SV:         46 LV SV Index:   35 LVOT Area:     2.84 cm  RIGHT VENTRICLE             IVC RV S prime:     10.10 cm/s  IVC diam: 1.10 cm TAPSE (M-mode): 0.7 cm LEFT ATRIUM             Index        RIGHT  ATRIUM           Index LA diam:        4.40 cm 3.37 cm/m   RA Area:     12.20 cm LA Vol (A2C):   70.4 ml 53.99 ml/m  RA Volume:   25.00 ml  19.17 ml/m LA Vol (A4C):   75.4 ml 57.82 ml/m LA Biplane Vol: 74.6 ml 57.21 ml/m  AORTIC VALVE LVOT Vmax:   90.10 cm/s LVOT Vmean:  74.100 cm/s LVOT VTI:    0.163 m  AORTA Ao Root diam: 2.60 cm Ao Asc diam:  2.90 cm MITRAL VALVE                TRICUSPID VALVE MV Area (PHT): 4.29 cm     TR Peak grad:   32.5 mmHg MV Area VTI:   1.73 cm     TR Vmax:        285.00 cm/s MV Peak grad:  9.1 mmHg MV Mean grad:  3.0 mmHg     SHUNTS MV Vmax:       1.51 m/s     Systemic VTI:  0.16 m MV Vmean:      78.5 cm/s    Systemic Diam: 1.90 cm MV Decel Time: 177 msec MV E velocity: 108.00 cm/s MV A velocity: 101.00 cm/s MV E/A ratio:  1.07 Jenkins Rouge MD Electronically signed by Jenkins Rouge MD Signature Date/Time: 10/02/2021/1:39:13 PM    Final    DG Hip Unilat W or Wo Pelvis 2-3 Views Right  Result Date: 09/30/2021 CLINICAL DATA:  Fall EXAM: DG HIP (WITH OR WITHOUT PELVIS) 2-3V RIGHT COMPARISON:  None. FINDINGS: There is no evidence of hip fracture or dislocation. There is no evidence of arthropathy or other focal bone abnormality. Arterial calcification. Prior fracture of L4 with vertebroplasty cement. IMPRESSION: Negative. Electronically Signed   By: Franchot Gallo M.D.   On: 09/30/2021 17:19   CT MAXILLOFACIAL WO CONTRAST  Result Date: 09/30/2021 CLINICAL DATA:  Facial trauma with right orbital bruising. EXAM: CT HEAD WITHOUT CONTRAST CT  MAXILLOFACIAL WITHOUT CONTRAST CT CERVICAL SPINE WITHOUT CONTRAST TECHNIQUE: Multidetector CT imaging of the head, cervical spine, and maxillofacial structures were performed using the standard protocol without intravenous contrast. Multiplanar CT image reconstructions of the cervical spine and maxillofacial structures were also generated. COMPARISON:  CT scan of the brain June 14, 2015 FINDINGS: CT HEAD FINDINGS Brain: No evidence of acute  infarction, hemorrhage, hydrocephalus, extra-axial collection or mass lesion/mass effect. Vascular: Calcified atherosclerosis in the intracranial carotids. Skull: Normal. Negative for fracture or focal lesion. Other: Hematoma and soft tissue swelling in the right supraorbital region. Air in the soft tissues suggests laceration. CT MAXILLOFACIAL FINDINGS Osseous: No fracture or mandibular dislocation. No destructive process. Orbits: Negative. No traumatic or inflammatory finding. Sinuses: Clear. Soft tissues: Soft tissue swelling and hematoma in the right Peri orbital and supraorbital region. Air in the soft tissues consistent with laceration. The underlying globe is in tact. No other soft tissue abnormalities are identified. CT CERVICAL SPINE FINDINGS Alignment: Trace retrolisthesis of C2 versus C3, not seen in 2016. Anterolisthesis of C4 versus C5, unchanged since 2016. Reversal of normal lordosis centered at C5-C6, unchanged since 2016. No other significant malalignment. Skull base and vertebrae: No acute fracture. No primary bone lesion or focal pathologic process. Soft tissues and spinal canal: No prevertebral soft tissue swelling. No other soft tissue abnormalities. Disc levels: Multilevel degenerative disc disease. Facet degenerative changes. Upper chest: Scarring in the apices. No acute abnormalities in the upper lungs. Other: Fracture of the proximal right humerus seen on the scout view. No other abnormalities. IMPRESSION: 1. No acute intracranial abnormalities. 2. Hematoma and soft tissue swelling in the right supraorbital region. No facial bone fractures. 3. Trace retrolisthesis of C2 versus C3, anterolisthesis of C4 versus C5, and reversal of normal lordosis at C5-6 is favored to be nonacute with no adjacent soft tissue swelling. The findings at C4-5 and C5-6 are unchanged since 2016. Recommend clinical correlation. 4. No fracture in the cervical spine. 5. Fracture of the proximal right humerus, better  assessed on the dedicated right shoulder x-ray. Electronically Signed   By: Dorise Bullion III M.D.   On: 09/30/2021 17:22    Microbiology: Recent Results (from the past 240 hour(s))  Resp Panel by RT-PCR (Flu A&B, Covid) Nasopharyngeal Swab     Status: None   Collection Time: 09/30/21  3:39 PM   Specimen: Nasopharyngeal Swab; Nasopharyngeal(NP) swabs in vial transport medium  Result Value Ref Range Status   SARS Coronavirus 2 by RT PCR NEGATIVE NEGATIVE Final    Comment: (NOTE) SARS-CoV-2 target nucleic acids are NOT DETECTED.  The SARS-CoV-2 RNA is generally detectable in upper respiratory specimens during the acute phase of infection. The lowest concentration of SARS-CoV-2 viral copies this assay can detect is 138 copies/mL. A negative result does not preclude SARS-Cov-2 infection and should not be used as the sole basis for treatment or other patient management decisions. A negative result may occur with  improper specimen collection/handling, submission of specimen other than nasopharyngeal swab, presence of viral mutation(s) within the areas targeted by this assay, and inadequate number of viral copies(<138 copies/mL). A negative result must be combined with clinical observations, patient history, and epidemiological information. The expected result is Negative.  Fact Sheet for Patients:  EntrepreneurPulse.com.au  Fact Sheet for Healthcare Providers:  IncredibleEmployment.be  This test is no t yet approved or cleared by the [place] FDA and  has been authorized for detection and/or diagnosis of SARS-CoV-2 by FDA under an Emergency Use Authorization (  EUA). This EUA will remain  in effect (meaning this test can be used) for the duration of the COVID-19 declaration under Section 564(b)(1) of the Act, 21 U.S.C.section 360bbb-3(b)(1), unless the authorization is terminated  or revoked sooner.       Influenza A by PCR NEGATIVE NEGATIVE  Final   Influenza B by PCR NEGATIVE NEGATIVE Final    Comment: (NOTE) The Xpert Xpress SARS-CoV-2/FLU/RSV plus assay is intended as an aid in the diagnosis of influenza from Nasopharyngeal swab specimens and should not be used as a sole basis for treatment. Nasal washings and aspirates are unacceptable for Xpert Xpress SARS-CoV-2/FLU/RSV testing.  Fact Sheet for Patients: EntrepreneurPulse.com.au  Fact Sheet for Healthcare Providers: IncredibleEmployment.be  This test is not yet approved or cleared by the [place] FDA and has been authorized for detection and/or diagnosis of SARS-CoV-2 by FDA under an Emergency Use Authorization (EUA). This EUA will remain in effect (meaning this test can be used) for the duration of the COVID-19 declaration under Section 564(b)(1) of the Act, 21 U.S.C. section 360bbb-3(b)(1), unless the authorization is terminated or revoked.  Performed at [facility], [place] [address], [place], [place] [address]   Culture, blood (routine x 2)     Status: None (Preliminary result)   Collection Time: 10/03/21  7:35 AM   Specimen: BLOOD RIGHT HAND  Result Value Ref Range Status   Specimen Description BLOOD RIGHT HAND  Final   Special Requests   Final    BOTTLES DRAWN AEROBIC AND ANAEROBIC Blood Culture adequate volume   Culture   Final    NO GROWTH 3 DAYS Performed at [facility], [place] [address], [place], [place] [address]    Report Status PENDING  Incomplete  Culture, blood (routine x 2)     Status: None (Preliminary result)   Collection Time: 10/03/21  7:35 AM   Specimen: BLOOD RIGHT ARM  Result Value Ref Range Status   Specimen Description BLOOD RIGHT ARM  Final   Special Requests   Final    BOTTLES DRAWN AEROBIC ONLY Blood Culture results may not be optimal due to an inadequate volume of blood received in culture bottles   Culture   Final    NO GROWTH 3 DAYS Performed at [facility],  [place] [address] [place], [place] [address]    Report Status PENDING  Incomplete  Urine Culture     Status: Abnormal   Collection Time: 10/03/21 11:17 AM   Specimen: Urine, Clean Catch  Result Value Ref Range Status   Specimen Description URINE, CLEAN CATCH  Final   Special Requests   Final    NONE Performed at [facility], [place] [address], [place], [place] [address]    Culture 30,000 COLONIES/mL ENTEROCOCCUS FAECALIS (A)  Final   Report Status 10/05/2021 FINAL  Final   Organism ID, Bacteria ENTEROCOCCUS FAECALIS (A)  Final      Susceptibility   Enterococcus faecalis - MIC*    AMPICILLIN <=2 SENSITIVE Sensitive     NITROFURANTOIN <=16 SENSITIVE Sensitive     VANCOMYCIN 1 SENSITIVE Sensitive     * 30,000 COLONIES/mL ENTEROCOCCUS FAECALIS     Labs: Basic Metabolic Panel: Recent Labs  Lab 10/02/21 0249 10/02/21 0926 10/03/21 0527 10/04/21 0452 10/05/21 0353  NA 139 138 137 139 139  K 4.5 4.8 4.9 5.0 5.1  CL 113* 113* 108 109 112*  CO2 15* 18* 20* 21* 21*  GLUCOSE 42* 117* 120*  108* 118*  BUN 51* 51* 58* 68* 72*  CREATININE 1.69* 1.71* 1.97* 1.96* 1.59*  CALCIUM 7.1* 7.0* 7.1* 7.1* 7.3*  MG  --   --  2.5*  --   --    Liver Function Tests: Recent Labs  Lab 09/30/21 1554 10/01/21 0719 10/05/21 0353  AST 35 34 48*  ALT 34 26 36  ALKPHOS 53 37* 76  BILITOT 0.6 0.3 0.4  PROT 6.1* 4.9* 4.6*  ALBUMIN 3.5 2.9* 1.9*   No results for input(s): LIPASE, AMYLASE in the last 168 hours. No results for input(s): AMMONIA in the last 168 hours. CBC: Recent Labs  Lab 09/30/21 1554 10/01/21 0719 10/01/21 1206 10/02/21 0926 10/03/21 0527 10/04/21 0452 10/05/21 0353  WBC 10.7* 10.5 12.5* 20.3* 20.8* 17.4* 13.9*  NEUTROABS 8.8* 6.2  --   --   --   --   --   HGB 9.4* 7.0* 6.8* 8.5* 8.8* 7.9* 7.6*  HCT 29.4* 22.3* 22.0* 25.6* 26.4* 24.7* 23.5*  MCV 103.2* 105.2* 106.3* 97.7 97.8 100.4* 99.2  PLT 207 163 131* 128* 121* 127* 137*   Cardiac Enzymes: Recent Labs  Lab  09/30/21 1554  CKTOTAL 146   BNP: BNP (last 3 results) No results for input(s): BNP in the last 8760 hours.  ProBNP (last 3 results) No results for input(s): PROBNP in the last 8760 hours.  CBG: Recent Labs  Lab 10/05/21 1608 10/05/21 2022 10/06/21 0010 10/06/21 0434 10/06/21 0815  GLUCAP 136* 125* 102* 79 80       Signed:  Oswald Hillock MD.  [facility] 10/06/2021, 10:42 AM

## 2021-10-06 NOTE — Progress Notes (Signed)
Patient arrived via bed at 1805. Patient is A&O x 2. Denies pain at this time. Bilateral lungs diminished lower lobes, ABD soft non distended. +1 edema to right lower leg,positive pedal pulses, cap refill less than 3 seconds, and warm to the touch. Will endorse remainder of new admission to 7Pm-7AM nurse. Call light and personal items within reach. Safety maintained.

## 2021-10-06 NOTE — Progress Notes (Signed)
PT Cancellation Note  Patient Details Name: [name] MRN: [id number] DOB: 10/13/1932   Cancelled Treatment:    Reason Eval/Treat Not Completed: Pain limiting ability to participate. Pt reports recently moving in bed with nursing staff and being in discomfort. Pt declines PT intervention at this time. Acute PT will continue to follow.   Zenaida Niece 10/06/2021, 3:54 PM

## 2021-10-06 NOTE — Plan of Care (Signed)
  Problem: Education: Goal: Knowledge of General Education information will improve Description: Including pain rating scale, medication(s)/side effects and non-pharmacologic comfort measures Outcome: Progressing   Problem: Clinical Measurements: Goal: Will remain free from infection Outcome: Progressing   Problem: Activity: Goal: Risk for activity intolerance will decrease Outcome: Progressing   Problem: Nutrition: Goal: Adequate nutrition will be maintained Outcome: Progressing   Problem: Elimination: Goal: Will not experience complications related to bowel motility Outcome: Progressing   Problem: Pain Managment: Goal: General experience of comfort will improve Outcome: Progressing   

## 2021-10-07 ENCOUNTER — Inpatient Hospital Stay (HOSPITAL_COMMUNITY): Payer: Medicare Other

## 2021-10-07 LAB — CBC WITH DIFFERENTIAL/PLATELET
Abs Immature Granulocytes: 0.26 10*3/uL — ABNORMAL HIGH (ref 0.00–0.07)
Basophils Absolute: 0 10*3/uL (ref 0.0–0.1)
Basophils Relative: 0 %
Eosinophils Absolute: 0.1 10*3/uL (ref 0.0–0.5)
Eosinophils Relative: 1 %
HCT: 26.4 % — ABNORMAL LOW (ref 36.0–46.0)
Hemoglobin: 8.8 g/dL — ABNORMAL LOW (ref 12.0–15.0)
Immature Granulocytes: 3 %
Lymphocytes Relative: 8 %
Lymphs Abs: 0.8 10*3/uL (ref 0.7–4.0)
MCH: 32.4 pg (ref 26.0–34.0)
MCHC: 33.3 g/dL (ref 30.0–36.0)
MCV: 97.1 fL (ref 80.0–100.0)
Monocytes Absolute: 1 10*3/uL (ref 0.1–1.0)
Monocytes Relative: 9 %
Neutro Abs: 8.3 10*3/uL — ABNORMAL HIGH (ref 1.7–7.7)
Neutrophils Relative %: 79 %
Platelets: 201 10*3/uL (ref 150–400)
RBC: 2.72 MIL/uL — ABNORMAL LOW (ref 3.87–5.11)
RDW: 15.4 % (ref 11.5–15.5)
WBC: 10.4 10*3/uL (ref 4.0–10.5)
nRBC: 0 % (ref 0.0–0.2)

## 2021-10-07 LAB — COMPREHENSIVE METABOLIC PANEL
ALT: 59 U/L — ABNORMAL HIGH (ref 0–44)
AST: 60 U/L — ABNORMAL HIGH (ref 15–41)
Albumin: 1.9 g/dL — ABNORMAL LOW (ref 3.5–5.0)
Alkaline Phosphatase: 72 U/L (ref 38–126)
Anion gap: 6 (ref 5–15)
BUN: 61 mg/dL — ABNORMAL HIGH (ref 8–23)
CO2: 21 mmol/L — ABNORMAL LOW (ref 22–32)
Calcium: 7.9 mg/dL — ABNORMAL LOW (ref 8.9–10.3)
Chloride: 108 mmol/L (ref 98–111)
Creatinine, Ser: 1.37 mg/dL — ABNORMAL HIGH (ref 0.44–1.00)
GFR, Estimated: 37 mL/min — ABNORMAL LOW (ref >60)
Glucose, Bld: 158 mg/dL — ABNORMAL HIGH (ref 70–99)
Potassium: 5.3 mmol/L — ABNORMAL HIGH (ref 3.5–5.1)
Sodium: 135 mmol/L (ref 135–145)
Total Bilirubin: 0.9 mg/dL (ref 0.3–1.2)
Total Protein: 5 g/dL — ABNORMAL LOW (ref 6.5–8.1)

## 2021-10-07 MED ORDER — ONDANSETRON HCL 4 MG PO TABS
4.0000 mg | ORAL_TABLET | Freq: Three times a day (TID) | ORAL | Status: DC | PRN
  Administered 2021-10-08 – 2021-10-09 (×3): 4 mg via ORAL
  Filled 2021-10-07 (×3): qty 1

## 2021-10-07 MED ORDER — ENSURE MAX PROTEIN PO LIQD
11.0000 [oz_av] | Freq: Every day | ORAL | Status: DC
  Administered 2021-10-07 – 2021-10-11 (×4): 11 [oz_av] via ORAL

## 2021-10-07 MED ORDER — DEXTROSE-NACL 5-0.9 % IV SOLN
INTRAVENOUS | Status: AC
Start: 2021-10-07 — End: 2021-10-08

## 2021-10-07 MED ORDER — CHLORHEXIDINE GLUCONATE CLOTH 2 % EX PADS
6.0000 | MEDICATED_PAD | Freq: Every day | CUTANEOUS | Status: DC
  Administered 2021-10-07 – 2021-10-09 (×3): 6 via TOPICAL

## 2021-10-07 NOTE — Progress Notes (Signed)
Pt family stated pt attends pain clinic when at home, currently takes 5 mg hydrocodone daily. Pt may benefit from higher pain med dose while in rehab

## 2021-10-07 NOTE — Progress Notes (Signed)
PROGRESS NOTE   Subjective/Complaints:  Not as alert- sedated this AM/groggy per nurse;  C/O nausea- didn't eat breakfast- had compazine, but more groggy from it.    Pt doesn't know when has LBM, but KUB done today- looks OK.     ROS: Limited by cognition/behavior  Objective:   DG Abd 1 View  Result Date: 10/07/2021 CLINICAL DATA:  Constipation K59.00 (ICD-10-CM) EXAM: ABDOMEN - 1 VIEW COMPARISON:  CT October 01, 2021. FINDINGS: Gas is noted in small bowel and colon. While the overall pattern is nonobstructive, there are few mildly dilated small bowel loops and probably dilated distal colon. No significant stool burden. No obvious free air on this limited single supine radiograph. No abnormal calcifications identified; however, bowel gas limits evaluation. Degenerative changes of the sign with kyphoplasty at multiple lower thoracic and lumbar levels. Incompletely imaged cardiac rhythm maintenance device. Partially imaged pelvic thermistor. Partially imaged nondisplaced fracture of the right pubis and superior pubic ramus, better characterized on recent CT. IMPRESSION: 1. Gas is noted in small bowel and colon. While the overall pattern is nonobstructive, there are few mildly dilated small bowel loops and probably dilated distal colon. No significant stool burden. A CT abdomen/pelvis could better evaluate for obstruction if clinically indicated. 2. Partially imaged nondisplaced fracture of the right pubis and superior pubic ramus, better characterized on recent CT. Electronically Signed   By: Margaretha Sheffield M.D.   On: 10/07/2021 10:17   Recent Labs    10/05/21 0353  WBC 13.9*  HGB 7.6*  HCT 23.5*  PLT 137*   Recent Labs    10/05/21 0353  NA 139  K 5.1  CL 112*  CO2 21*  GLUCOSE 118*  BUN 72*  CREATININE 1.59*  CALCIUM 7.3*    Intake/Output Summary (Last 24 hours) at 10/07/2021 1136 Last data filed at 10/07/2021  0825 Gross per 24 hour  Intake 120 ml  Output 826 ml  Net -706 ml        Physical Exam: Vital Signs Blood pressure 121/81, pulse 93, temperature 97.9 F (36.6 C), temperature source Oral, resp. rate 18, height 4\' 8"  (1.422 m), weight 51.3 kg, SpO2 98 %.    General: awake, alert; but groggy; sedated; facial bruising severe- more purple;  NAD HENT: conjugate gaze; oropharynx dry- facial bruising- knot on forehead on R CV: regular rate; no JVD Pulmonary: CTA B/L; no W/R/R- good air movement GI: soft, NT, ND, (+)BS- hypoactive Psychiatric: groggy, confused;  Neurological: groggy  Musculoskeletal:        General: Swelling and tenderness (RUE and pelvis) present.  GU: Foley in place Skin:    Comments: Bruising RUE, bilateral LE's. Both legs with dressings in place midway.  R hand very swollen- in sling Neurological:     Mental Status: She is oriented to person, place, and time.     Cranial Nerves: No cranial nerve deficit.     Sensory: No sensory deficit.     Comments: Speech clear. Fair insight and awareness. Follows basic commands. Normal language. Motor limited by pain RUE and bilateral LE's. LUE grossly 3-4/5. LE's 2/5 prox to 4/5 distally.   Psychiatric:     Comments:  Pt anxious, cooperative.  Mostly just uncomfortable because of her pain     Assessment/Plan: 1. Functional deficits which require 3+ hours per day of interdisciplinary therapy in a comprehensive inpatient rehab setting. Physiatrist is providing close team supervision and 24 hour management of active medical problems listed below. Physiatrist and rehab team continue to assess barriers to discharge/monitor patient progress toward functional and medical goals  Care Tool:  Bathing  Bathing activity did not occur: Refused           Bathing assist       Upper Body Dressing/Undressing Upper body dressing   What is the patient wearing?: Pull over shirt, Orthosis Orthosis activity level: Performed by  helper  Upper body assist Assist Level: Maximal Assistance - Patient 25 - 49%    Lower Body Dressing/Undressing Lower body dressing      What is the patient wearing?: Incontinence brief, Pants     Lower body assist Assist for lower body dressing: Dependent - Patient 0%     Toileting Toileting    Toileting assist Assist for toileting: Dependent - Patient 0%     Transfers Chair/bed transfer  Transfers assist           Locomotion Ambulation   Ambulation assist              Walk 10 feet activity   Assist           Walk 50 feet activity   Assist           Walk 150 feet activity   Assist           Walk 10 feet on uneven surface  activity   Assist           Wheelchair     Assist               Wheelchair 50 feet with 2 turns activity    Assist            Wheelchair 150 feet activity     Assist          Blood pressure 121/81, pulse 93, temperature 97.9 F (36.6 C), temperature source Oral, resp. rate 18, height 4\' 8"  (1.422 m), weight 51.3 kg, SpO2 98 %.  Medical Problem List and Plan: 1.  Functional deficits secondary to polytrauma including right proximal humerus fx, pelvic fx's             -patient may shower             -ELOS/Goals: 10-12 days, supervision to min assist goals  First day of therapy- CIR- and was total assist with OT fyi 2.  Antithrombotics: -DVT/anticoagulation:  Pharmaceutical: Lovenox--Eliquis has been on hold             -antiplatelet therapy: N/A 3. Chronic LBP/compression Fx/Pain Management:  Was on hydrocodone prn PTA. --Oxycodone prn--discontinue IV dilaudid.  -add scheduled robaxin as well, observe for excessive sedation 4. Mood: LCSW to follow for evaluation and support.              -antipsychotic agents: N/A 5. Neuropsych: This patient is capable of making decisions on her own behalf. 6. Skin/Wound Care: Monitor wound for healing. Routine pressure relief measures.   7. Fluids/Electrolytes/Nutrition: Strict I/O. Check CMET in am.  8. Right humerus fracture: NWB RUE with sling at all times --may doff for ADL  11/12- hand swollen- will elevate more 9. Pelvic Fx: WBAT BLE 10. Acute on chronic renal failure  with hyperkalemia:  11/12- Last labs show Cr down to 1.59 from 1.96 and BUN at 72- a little more up- also K+ 5.0- will recheck in AM due to grogginess, lethargy, maybe confusion.  11. Polymyalgia rheumatica: Followed by [facility] on medrol 2mg /day PTA.  --On Medrol 4 mg/day since admission 12. PAF: Monitor HR TID--Monitor HR TID. Continue coreg bid.  13. Leucocytosis: Monitor for signs of infection. Likely steroid effect.  14. Rectal pain/Constipation: Miralax added to Senna today.              --likely needs disimpaction as reporting rectal spasms.              -suppository/enema             -also related to fractures themselves and associated skeletal muscle spasms.  11/12- has a few dilated loops of bowel, but no significant stool burden- LBM this AM 15. Hypothyroid  On supplement  16. Urinary retention: Will keep foley for a few more days until constipation addressed.   11/12- will try to remove Monday  17. Nausea  11/12- could be from dilated loops- will put on liquid diet and order compazine and Zofran if needed- likely heading towards ileus-   -KUB in AM; will start low IVFs D5NS and recheck labs in Am; 60cc/hour Also changed to liquid diet due to dilated loops of bowel-    I spent a total of 38 minutes on total care today- more than 50% on coordination of care- d/w nurse x3 and radiology.     LOS: 1 days A FACE TO FACE EVALUATION WAS PERFORMED  Toryn Dewalt 10/07/2021, 11:36 AM

## 2021-10-07 NOTE — Evaluation (Addendum)
Physical Therapy Note  Patient Details  Name: [name] MRN: [id number] Date of Birth: 1932/02/26  Today's Date: 10/07/2021 PT Missed Time: 75 Minutes Missed Time Reason: Patient fatigue;Patient unwilling to participate;Pain   Pt received supine in bed lethargic, but initially appears agreeable to therapy session. Started evaluation with education on PT role and subjective questioning when pt suddenly states "Please just let me rest." Followed by "please don't make me hurt worse" reporting pain level of 100/10 - nurse notified for medication administration. Pt requesting for therapist to leave therefore therapist planning to return at later time to perform evaluation.   Therapist returned in the afternoon with pt still supine in bed, HOB elevated, with her meal tray sitting there. Therapist offered to assist patient with her food but she stated she does not want to eat. Pt then repeats "please just let me rest." Therapist attempts to encourage patient to participate and offered to assist pt with repositioning in the bed for pressure relief and pain management to which pt responds "please just let me die in peace." Therapist reoriented pt to situation; however, pt continues to decline participation. Pt left supine in bed with needs in reach, lines intact, and bed alarm on. Nurse made aware of inability to perform physical therapy evaluation at this time. Plan to reschedule evaluation. Missed 75 minutes of skilled physical therapy.   Tawana Scale , PT, DPT, NCS, CSRS 10/07/2021, 8:03 AM

## 2021-10-07 NOTE — Progress Notes (Signed)
Pharmacy Antibiotic Note  [name] is a 87 y.o. female admitted on 10/06/2021 with sepsis of an unknown source.  Pharmacy has been consulted for cefepime dosing.   WBC decreased to 13.9 from 20.3, of note, patient is currently receiving methylprednisolone. Scr improved to 1.56 (CrCl 16 mL/min). UA negative. Pt has remained afebrile. Has PCN allergy but has tolerated cephalosporins in the past. Last dose is planned for tonight 11/12.  Plan: Cefepime 2g IV every 24 hours - last dose 11/12 Monitor renal fx, clinical pic, cx results  Height: 4\' 8"  (142.2 cm) Weight: 51.3 kg (113 lb 1.5 oz) IBW/kg (Calculated) : 36.3  Temp (24hrs), Avg:98.1 F (36.7 C), Min:97.5 F (36.4 C), Max:98.6 F (37 C)  Recent Labs  Lab 09/30/21 1554 10/01/21 0719 10/01/21 1206 10/02/21 0249 10/02/21 0926 10/03/21 0527 10/04/21 0452 10/05/21 0353  WBC 10.7*   < > 12.5*  --  20.3* 20.8* 17.4* 13.9*  CREATININE 1.42*   < >  --  1.69* 1.71* 1.97* 1.96* 1.59*  LATICACIDVEN 1.3  --   --   --   --   --   --   --    < > = values in this interval not displayed.     Estimated Creatinine Clearance: 16.6 mL/min (A) (by C-G formula based on SCr of 1.59 mg/dL (H)).    Allergies  Allergen Reactions   Pertussis Vaccines Swelling   Sulfa Antibiotics Nausea Only   Amoxicillin Rash and Other (See Comments)    Has patient had a PCN reaction causing immediate rash, facial/tongue/throat swelling, SOB or lightheadedness with hypotension: yes Has patient had a PCN reaction causing severe rash involving mucus membranes or skin necrosis: no Has patient had a PCN reaction that required hospitalization no Has patient had a PCN reaction occurring within the last 10 years: yes If all of the above answers are "NO", then may proceed with Cephalosporin use.     Antimicrobials this admission: Cefepime 11/8 >>   Dose adjustments this admission: N/A  Microbiology results: 11/8 BCx: no growth to date 11/8 UCx:  Enterococcus faecalis, insignificant growth

## 2021-10-07 NOTE — Evaluation (Signed)
Occupational Therapy Assessment and Plan  Patient Details  Name: [name] MRN: [id number] Date of Birth: 1932/11/02  OT Diagnosis: abnormal posture, acute pain, cognitive deficits, disturbance of vision, and muscle weakness (generalized) Rehab Potential: Rehab Potential (ACUTE ONLY): Fair ELOS: 2.5 weeks   Today's Date: 10/07/2021 OT Individual Time: 3888-7579 OT Individual Time Calculation (min): 55 min     Hospital Problem: Principal Problem:   Pelvic fracture ([place])   Past Medical History:  Past Medical History:  Diagnosis Date   Allergic rhinitis    Anemia    iron deficient   Aneurysm ([facility])    Anxiety    Basal cell carcinoma    Chronic anemia    Chronic low back pain    Colon polyps    GERD (gastroesophageal reflux disease)    Hiatal hernia    HTN (hypertension)    Hypercholesteremia    Hyperlipidemia    Hypothyroidism    Insomnia    Left heart failure ([place])    Osteoporosis    S/P forteo treatment as of 02/2009   Pacemaker    Polymyalgia ([place])    rheumatica-steroids per Rheum   Systolic heart failure    Past Surgical History:  Past Surgical History:  Procedure Laterality Date   ABDOMINAL HYSTERECTOMY     ANGIOPLASTY     BACK SURGERY     CARDIAC CATHETERIZATION  10/16/06   CATARACT EXTRACTION     defibulator implant     EP IMPLANTABLE DEVICE N/A 04/10/2016   Procedure: BiV Pacemaker Insertion CRT-P;  Surgeon: Evans Lance, MD;  Location: [facility];  Service: Cardiovascular;  Laterality: N/A;   FOOT SURGERY     MITRAL VALVE REPAIR     skin cancer removal     THYROIDECTOMY     TONSILLECTOMY      Assessment & Plan Clinical Impression: RH-female with history of HTN, chronic LBP, anemia, recent diagnosis of PAF- on eliquis, PPM, CKD, PMR-chronic steroids who was admitted on 10/01/21 with fall the day before and found to have transverse proximal humeral head fracture and right pubic rami and superior rami fractures. She was also noted to be  anemic and had hyperkalemia requiring dose of Lokelma. She was started on stress dose steroids and elquis held. Right calf laceration stapled and ortho consulted for input. Dr. Stann Mainland recommended non-operative management recommended with RUE placed in sling and to be NWB--ok to doff for ADL with elbow close to body and no ROM for at least 2-3 weeks. WBAT on RLE with recs to follow up in 2-3 weeks for follow up films. Hospital course significant for brief runs of asymptomatic VT as well as acute on chronic anemia requiring one unit PRBC. Lasix held due to worsening of renal status and foley placed due to urinary retention. Cefepime added due to leucocytosis on 11/08 due to sepsis and to complete 5 day course antibiotic regimen. Patient transferred to CIR on 10/06/2021 .    Patient currently requires total with basic self-care skills secondary to muscle weakness, decreased visual acuity, and decreased problem solving, decreased safety awareness, decreased memory, and delayed processing.  Prior to hospitalization, patient could complete ADLs/IADLs with independence.  Patient will benefit from skilled intervention to decrease level of assist with basic self-care skills and increase independence with basic self-care skills prior to discharge home with daughters to provide 24hr supervision/assist.  Anticipate patient will require 24 hour supervision and minimal physical assistance and follow up home health.  OT -  End of Session Activity Tolerance: Tolerates 10 - 20 min activity with multiple rests Endurance Deficit: Yes OT Assessment Rehab Potential (ACUTE ONLY): Fair OT Barriers to Discharge: Inaccessible home environment OT Barriers to Discharge Comments: 1 STE OT Patient demonstrates impairments in the following area(s): Balance;Cognition;Endurance;Motor;Pain;Safety;Skin Integrity OT Basic ADL's Functional Problem(s): Grooming;Bathing;Dressing;Toileting;Eating OT Transfers Functional Problem(s):  Toilet;Tub/Shower OT Additional Impairment(s): Fuctional Use of Upper Extremity OT Plan OT Intensity: Minimum of 1-2 x/day, 45 to 90 minutes OT Frequency: 5 out of 7 days OT Duration/Estimated Length of Stay: 2.5 weeks OT Treatment/Interventions: Balance/vestibular training;Cognitive remediation/compensation;Community reintegration;Discharge planning;DME/adaptive equipment instruction;Functional mobility training;Pain management;Patient/family education;Psychosocial support;Self Care/advanced ADL retraining;Skin care/wound managment;Therapeutic Activities;Therapeutic Exercise;UE/LE Strength taining/ROM;UE/LE Coordination activities;Visual/perceptual remediation/compensation OT Self Feeding Anticipated Outcome(s): Independent OT Basic Self-Care Anticipated Outcome(s): Supervision to Min A OT Toileting Anticipated Outcome(s): Supervision A OT Bathroom Transfers Anticipated Outcome(s): Supervision to Min A OT Recommendation Patient destination: Home Follow Up Recommendations: Home health OT;24 hour supervision/assistance Equipment Recommended: To be determined   OT Evaluation Precautions/Restrictions  Precautions Precautions: Fall;Other (comment) Precaution Comments: Significant bruising to face/neck R>L, High fall risk Required Braces or Orthoses: Sling (At all times except for ADLs) Restrictions Weight Bearing Restrictions: Yes RUE Weight Bearing: Non weight bearing RLE Weight Bearing: Weight bearing as tolerated LLE Weight Bearing: Weight bearing as tolerated Other Position/Activity Restrictions: Sling at all times except for during ADLs; keep elbow close to body when not in sling. No PROM/AROM at R shoulder for 2-3 weeks General Chart Reviewed: Yes Additional Pertinent History: HTN, chronic LBP, anemia, recent diagnosis of PAF- on eliquis, PPM, CKD, PMR-chronic steroids OT Amount of Missed Time: 5 Minutes PT Missed Treatment Reason: Other (Comment) (MD preset in  room) Family/Caregiver Present: No Vital Signs   Pain Pain Assessment Pain Scale: Faces Pain Score: 10-Worst pain ever Faces Pain Scale: Hurts whole lot Pain Type: Acute pain Pain Location:  ("All over") Pain Descriptors / Indicators: Constant;Aching;Moaning;Crying Pain Onset: On-going Pain Intervention(s): RN made aware;Repositioned;Distraction;Emotional support Home Living/Prior Functioning Home Living Family/patient expects to be discharged to:: Private residence Living Arrangements: Alone Available Help at Discharge: Family, Available 24 hours/day (Daughter will proivde 24hr supervision/assit at time of d/c per med chart) Type of Home: House Home Access: Stairs to enter [employer] of Steps: 1 Entrance Stairs-Rails: Left Home Layout: One level Bathroom Shower/Tub: Multimedia programmer: Programmer, systems: Yes  Lives With: Alone IADL History Occupation: Retired Prior Function Level of Independence: Independent with basic ADLs, Independent with homemaking with ambulation, Independent with transfers Driving: Yes Vision Baseline Vision/History: 1 Wears glasses (Wears readers) Ability to See in Adequate Light: 0 Adequate Patient Visual Report: Blurring of vision Vision Assessment?: Vision impaired- to be further tested in functional context Additional Comments: Assessment limited secondary to lethargy. Able to correctly identify number of therapists fingers presented in central vision. Difficulty seeing wall clock despite cues. Perception  Perception: Not tested Praxis Praxis: Not tested Cognition Overall Cognitive Status: Impaired/Different from baseline Arousal/Alertness: Lethargic Orientation Level: Person Year: Other (Comment) (Did not offer answer when asked) Month:  (Did not offer answer when asked) Day of Week: Incorrect Memory: Impaired Memory Impairment: Decreased recall of new information Immediate Memory Recall:  Sock;Blue;Bed Memory Recall Sock: Not able to recall Memory Recall Blue: Not able to recall Memory Recall Bed: Not able to recall Attention: Focused;Sustained Focused Attention: Appears intact Sustained Attention: Appears intact Safety/Judgment: Impaired Comments: Cognitive deficits likely exacerbated by letahrgy secondary to anti-nausea medication. Sensation Sensation Light Touch: Appears Intact Hot/Cold: Not tested Proprioception:  Not tested Stereognosis: Not tested Coordination Gross Motor Movements are Fluid and Coordinated: No Fine Motor Movements are Fluid and Coordinated: No Coordination and Movement Description: Decreased coordination bilaterally Finger Nose Finger Test: Over shoots on L. Unable to assess on R secondary to precautions. Motor  Motor Motor: Within Functional Limits  Trunk/Postural Assessment  Cervical Assessment Cervical Assessment: Exceptions to [facility] (Forward head) Thoracic Assessment Thoracic Assessment: Exceptions to [facility] (Kyphotic) Lumbar Assessment Lumbar Assessment: Exceptions to [facility] (Anterior pelvic tilt) Postural Control Postural Control: Deficits on evaluation Righting Reactions: Delayed; insufficient Protective Responses: Delayed; insufficient  Balance Balance Balance Assessed: Yes Static Sitting Balance Static Sitting - Balance Support: Left upper extremity supported Static Sitting - Level of Assistance: 3: Mod assist Dynamic Sitting Balance Sitting balance - Comments: Mod A to maintain long sitting position x several trials in prep to sitting at EOB. Extremity/Trunk Assessment RUE Assessment RUE Assessment: Exceptions to [facility] Passive Range of Motion (PROM) Comments: DNT at shoulder 2/2 precautions Active Range of Motion (AROM) Comments: DNT at shoulder 2/2 precautions; limited elbow flexion and wrist/digit flexion/extension secondary to pain. General Strength Comments: Gross grasp 3-/5 LUE Assessment LUE Assessment: Within Functional  Limits Active Range of Motion (AROM) Comments: Limited AROM at shoulder General Strength Comments: Generalized weakness  Care Tool Care Tool Self Care Eating Eating activity did not occur: Refused      Oral Care  Oral care, brush teeth, clean dentures activity did not occur: Refused      Bathing Bathing activity did not occur: Refused            Upper Body Dressing(including orthotics)   What is the patient wearing?: Pull over shirt;Orthosis Orthosis activity level: Performed by helper Assist Level: Maximal Assistance - Patient 25 - 49%    Lower Body Dressing (excluding footwear)   What is the patient wearing?: Incontinence brief;Pants Assist for lower body dressing: Dependent - Patient 0%    Putting on/Taking off footwear   What is the patient wearing?: Socks Assist for footwear: Dependent - Patient 0%       Care Tool Toileting Toileting activity   Assist for toileting: Dependent - Patient 0%     Care Tool Bed Mobility Roll left and right activity   Roll left and right assist level: Maximal Assistance - Patient 25 - 49%    Sit to lying activity Sit to lying activity did not occur: Refused      Lying to sitting on side of bed activity Lying to sitting on side of bed activity did not occur: the ability to move from lying on the back to sitting on the side of the bed with no back support.: Refused       Care Tool Transfers Sit to stand transfer Sit to stand activity did not occur: Refused      Chair/bed transfer Chair/bed transfer activity did not occur: Producer, television/film/video transfer activity did not occur: Refused       Care Tool Cognition  Expression of Ideas and Wants Expression of Ideas and Wants: 3. Some difficulty - exhibits some difficulty with expressing needs and ideas (e.g, some words or finishing thoughts) or speech is not clear  Understanding Verbal and Non-Verbal Content Understanding Verbal and Non-Verbal Content: 2. Sometimes  understands - understands only basic conversations or simple, direct phrases. Frequently requires cues to understand   Memory/Recall Ability Memory/Recall Ability : None of the above were recalled   Refer to Care Plan  for Long Term Goals  SHORT TERM GOAL WEEK 1 OT Short Term Goal 1 (Week 1): Patient will don UB clothing with Mod A seated EOB. OT Short Term Goal 2 (Week 1): Patient will complete sit to stand transfer with Max A and LRAD. OT Short Term Goal 3 (Week 1): Patient will complete 1/3 parts of toileting task with Mod A. OT Short Term Goal 4 (Week 1): Patient wil maintain static sitting at EOB with supervision A.  Recommendations for other services: Other: TBD    Skilled Therapeutic Intervention Patient met lying supine in bed asleep and difficult to awaken. Very lethargic throughout. Required increased coaxing/encouragement for participation with therapy efforts. Patient c/o pain "all over" and nausea. RN made aware. Patient not due for meds until 1hr from start of treatment session. Patient offered ginger ale requiring hand over hand assist initially to bring straw to mouth to take sips. Max A at bed level to don UB clothing and Total A to don LB clothing and footwear. Patient refused all EOB/OOB activity this date secondary to pain. Refused oral hygiene at bed level as well. MD entered at conclusion of session stating increased lethargy may likely be due to anti-nausea meds. Session concluded with patient lying supine in bed with call bell within reach, bed alarm activated and all needs met.   ADL ADL Eating: Unable to assess Grooming: Moderate cueing Where Assessed-Grooming: Bed level Upper Body Bathing: Unable to assess (Refused) Lower Body Bathing: Unable to assess (Refused) Upper Body Dressing: Maximal assistance Where Assessed-Upper Body Dressing: Bed level Lower Body Dressing: Dependent Where Assessed-Lower Body Dressing: Bed level Toileting: Unable to assess Mobility   Bed Mobility Bed Mobility: Rolling Right;Rolling Left Rolling Right: Maximal Assistance - Patient 25-49% Rolling Left: Total Assistance - Patient < 25%   Discharge Criteria: Patient will be discharged from OT if patient refuses treatment 3 consecutive times without medical reason, if treatment goals not met, if there is a change in medical status, if patient makes no progress towards goals or if patient is discharged from hospital.  The above assessment, treatment plan, treatment alternatives and goals were discussed and mutually agreed upon: by patient  Talyia Allende R Howerton-Davis 10/07/2021, 12:31 PM

## 2021-10-07 NOTE — Progress Notes (Addendum)
Occupational Therapy Session Note  Patient Details  Name: [name] MRN: [id number] Date of Birth: Sep 19, 1932   Short Term Goals: Week 1:  OT Short Term Goal 1 (Week 1): Patient will don UB clothing with Mod A seated EOB. OT Short Term Goal 2 (Week 1): Patient will complete sit to stand transfer with Max A and LRAD. OT Short Term Goal 3 (Week 1): Patient will complete 1/3 parts of toileting task with Mod A. OT Short Term Goal 4 (Week 1): Patient wil maintain static sitting at EOB with supervision A.  Skilled Therapeutic Interventions/Progress Updates:  Patient refused PT eval earlier this date. PT to see patient during scheduled OT time this afternoon. Patient to miss 60 minutes of skilled OT treatment session secondary to PT eval.   Therapy Documentation Precautions:  Precautions Precautions: Fall, Other (comment) Precaution Comments: Significant bruising to face/neck R>L, High fall risk Required Braces or Orthoses: Sling (At all times except for ADLs) Restrictions Weight Bearing Restrictions: Yes RUE Weight Bearing: Non weight bearing RLE Weight Bearing: Weight bearing as tolerated LLE Weight Bearing: Weight bearing as tolerated Other Position/Activity Restrictions: Sling at all times except for during ADLs; keep elbow close to body when not in sling. No PROM/AROM at R shoulder for 2-3 weeks General: OT Amount of Missed Time: 60 Minutes PT Missed Treatment Reason: PT eval  Therapy/Group: Individual Therapy  Geanette Buonocore R Howerton-Davis 10/07/2021, 1:31 PM

## 2021-10-08 ENCOUNTER — Inpatient Hospital Stay (HOSPITAL_COMMUNITY): Payer: Medicare Other

## 2021-10-08 DIAGNOSIS — D62 Acute posthemorrhagic anemia: Secondary | ICD-10-CM

## 2021-10-08 DIAGNOSIS — N179 Acute kidney failure, unspecified: Secondary | ICD-10-CM

## 2021-10-08 DIAGNOSIS — N189 Chronic kidney disease, unspecified: Secondary | ICD-10-CM

## 2021-10-08 DIAGNOSIS — K560 Paralytic ileus: Secondary | ICD-10-CM

## 2021-10-08 LAB — CBC WITH DIFFERENTIAL/PLATELET
Abs Immature Granulocytes: 0.36 10*3/uL — ABNORMAL HIGH (ref 0.00–0.07)
Basophils Absolute: 0 10*3/uL (ref 0.0–0.1)
Basophils Relative: 0 %
Eosinophils Absolute: 0.5 10*3/uL (ref 0.0–0.5)
Eosinophils Relative: 5 %
HCT: 25.4 % — ABNORMAL LOW (ref 36.0–46.0)
Hemoglobin: 8.3 g/dL — ABNORMAL LOW (ref 12.0–15.0)
Immature Granulocytes: 4 %
Lymphocytes Relative: 17 %
Lymphs Abs: 1.7 10*3/uL (ref 0.7–4.0)
MCH: 31.8 pg (ref 26.0–34.0)
MCHC: 32.7 g/dL (ref 30.0–36.0)
MCV: 97.3 fL (ref 80.0–100.0)
Monocytes Absolute: 1.3 10*3/uL — ABNORMAL HIGH (ref 0.1–1.0)
Monocytes Relative: 13 %
Neutro Abs: 6.3 10*3/uL (ref 1.7–7.7)
Neutrophils Relative %: 61 %
Platelets: 227 10*3/uL (ref 150–400)
RBC: 2.61 MIL/uL — ABNORMAL LOW (ref 3.87–5.11)
RDW: 15.3 % (ref 11.5–15.5)
WBC: 10.3 10*3/uL (ref 4.0–10.5)
nRBC: 0 % (ref 0.0–0.2)

## 2021-10-08 LAB — BASIC METABOLIC PANEL
Anion gap: 7 (ref 5–15)
BUN: 54 mg/dL — ABNORMAL HIGH (ref 8–23)
CO2: 19 mmol/L — ABNORMAL LOW (ref 22–32)
Calcium: 7.6 mg/dL — ABNORMAL LOW (ref 8.9–10.3)
Chloride: 109 mmol/L (ref 98–111)
Creatinine, Ser: 1.16 mg/dL — ABNORMAL HIGH (ref 0.44–1.00)
GFR, Estimated: 46 mL/min — ABNORMAL LOW (ref >60)
Glucose, Bld: 93 mg/dL (ref 70–99)
Potassium: 5 mmol/L (ref 3.5–5.1)
Sodium: 135 mmol/L (ref 135–145)

## 2021-10-08 LAB — CULTURE, BLOOD (ROUTINE X 2)
Culture: NO GROWTH
Culture: NO GROWTH
Special Requests: ADEQUATE

## 2021-10-08 MED ORDER — METHOCARBAMOL 500 MG PO TABS
500.0000 mg | ORAL_TABLET | Freq: Three times a day (TID) | ORAL | Status: DC
  Administered 2021-10-08 – 2021-10-09 (×3): 500 mg via ORAL
  Filled 2021-10-08 (×3): qty 1

## 2021-10-08 MED ORDER — OXYCODONE HCL 5 MG PO TABS
5.0000 mg | ORAL_TABLET | Freq: Two times a day (BID) | ORAL | Status: DC
  Administered 2021-10-08 – 2021-10-11 (×6): 5 mg via ORAL
  Filled 2021-10-08 (×7): qty 1

## 2021-10-08 NOTE — Progress Notes (Signed)
PROGRESS NOTE   Subjective/Complaints: Pt appears MUCH more alert today then when I last saw her. Asked if she could have IV MSO4 prior to therapies. Pt goes to pain clinic as outpt  ROS: Limited due to cognitive/behavioral   Objective:   DG Abd 1 View  Result Date: 10/07/2021 CLINICAL DATA:  Constipation K59.00 (ICD-10-CM) EXAM: ABDOMEN - 1 VIEW COMPARISON:  CT October 01, 2021. FINDINGS: Gas is noted in small bowel and colon. While the overall pattern is nonobstructive, there are few mildly dilated small bowel loops and probably dilated distal colon. No significant stool burden. No obvious free air on this limited single supine radiograph. No abnormal calcifications identified; however, bowel gas limits evaluation. Degenerative changes of the sign with kyphoplasty at multiple lower thoracic and lumbar levels. Incompletely imaged cardiac rhythm maintenance device. Partially imaged pelvic thermistor. Partially imaged nondisplaced fracture of the right pubis and superior pubic ramus, better characterized on recent CT. IMPRESSION: 1. Gas is noted in small bowel and colon. While the overall pattern is nonobstructive, there are few mildly dilated small bowel loops and probably dilated distal colon. No significant stool burden. A CT abdomen/pelvis could better evaluate for obstruction if clinically indicated. 2. Partially imaged nondisplaced fracture of the right pubis and superior pubic ramus, better characterized on recent CT. Electronically Signed   By: Margaretha Sheffield M.D.   On: 10/07/2021 10:17   Recent Labs    10/07/21 1434 10/08/21 0531  WBC 10.4 10.3  HGB 8.8* 8.3*  HCT 26.4* 25.4*  PLT 201 227   Recent Labs    10/07/21 1434 10/08/21 0531  NA 135 135  K 5.3* 5.0  CL 108 109  CO2 21* 19*  GLUCOSE 158* 93  BUN 61* 54*  CREATININE 1.37* 1.16*  CALCIUM 7.9* 7.6*    Intake/Output Summary (Last 24 hours) at 10/08/2021  0911 Last data filed at 10/08/2021 0748 Gross per 24 hour  Intake 680 ml  Output 1100 ml  Net -420 ml        Physical Exam: Vital Signs Blood pressure (!) 173/52, pulse 96, temperature 98.5 F (36.9 C), temperature source Oral, resp. rate 16, height 4\' 8"  (1.422 m), weight 51.3 kg, SpO2 97 %.    Constitutional: No distress . Vital signs reviewed. HEENT: NCAT, EOMI, oral membranes moist Neck: supple Cardiovascular: RRR without murmur. No JVD    Respiratory/Chest: CTA Bilaterally without wheezes or rales. Normal effort    GI/Abdomen: BS +, non-tender, sl-distended Ext: no clubbing, cyanosis,   Psych: pleasant and cooperative, less anxious today Musculoskeletal:        General: Swelling and tenderness (RUE and pelvis) present.  GU: Foley remains in place Skin:    Comments: Bruising RUE, bilateral LE's. Both legs with dressings in place midway.  R hand very swollen- in sling Neurological:     Mental Status: Much more alert. She is oriented to person, place. Speech clearer, phonation improved.     Cranial Nerves: No cranial nerve deficit.     Sensory: No sensory deficit.      Normal language. Motor limited by pain RUE and bilateral LE's. LUE grossly 3-4/5. LE's 2/5 prox to 4/5 distally.  Assessment/Plan: 1. Functional deficits which require 3+ hours per day of interdisciplinary therapy in a comprehensive inpatient rehab setting. Physiatrist is providing close team supervision and 24 hour management of active medical problems listed below. Physiatrist and rehab team continue to assess barriers to discharge/monitor patient progress toward functional and medical goals  Care Tool:  Bathing  Bathing activity did not occur: Refused           Bathing assist       Upper Body Dressing/Undressing Upper body dressing   What is the patient wearing?: Pull over shirt, Orthosis Orthosis activity level: Performed by helper  Upper body assist Assist Level: Maximal Assistance  - Patient 25 - 49%    Lower Body Dressing/Undressing Lower body dressing      What is the patient wearing?: Incontinence brief, Pants     Lower body assist Assist for lower body dressing: Dependent - Patient 0%     Toileting Toileting    Toileting assist Assist for toileting: Dependent - Patient 0%     Transfers Chair/bed transfer  Transfers assist  Chair/bed transfer activity did not occur: Refused        Locomotion Ambulation   Ambulation assist   Ambulation activity did not occur: Refused          Walk 10 feet activity   Assist           Walk 50 feet activity   Assist           Walk 150 feet activity   Assist           Walk 10 feet on uneven surface  activity   Assist           Wheelchair     Assist               Wheelchair 50 feet with 2 turns activity    Assist            Wheelchair 150 feet activity     Assist          Blood pressure (!) 173/52, pulse 96, temperature 98.5 F (36.9 C), temperature source Oral, resp. rate 16, height 4\' 8"  (1.422 m), weight 51.3 kg, SpO2 97 %.  Medical Problem List and Plan: 1.  Functional deficits secondary to polytrauma including right proximal humerus fx, pelvic fx's             -patient may shower             -ELOS/Goals: 10-12 days, supervision to min assist goals  -Continue CIR therapies including PT, OT , pain limiting 2.  Antithrombotics: -DVT/anticoagulation:  Pharmaceutical: Lovenox--Eliquis has been on hold             -antiplatelet therapy: N/A 3. Chronic LBP/compression Fx/Pain Management:  Was on hydrocodone prn PTA. --Oxycodone prn-- .  -added scheduled robaxin as well--tolerating 11/13-discussed pain mgt with patient. Told her that we would not be adding iv morphine. She does use hydrocodone as outpt  -will add scheduled oxycodone 5mg  at 0700 and 1200 daily outside of her prn doses.  4. Mood: LCSW to follow for evaluation and support.               -antipsychotic agents: N/A 5. Neuropsych: This patient is capable of making decisions on her own behalf. 6. Skin/Wound Care: Monitor wound for healing. Routine pressure relief measures.  7. Fluids/Electrolytes/Nutrition: Strict I/O.   8. Right humerus fracture: NWB RUE with sling at all times --  may doff for ADL  11/12- hand swollen- continue to elevate 9. Pelvic Fx: WBAT BLE 10. Acute on chronic renal failure with hyperkalemia:  11/13 BUN/Cr continue to improve -continue current rate of IVF -repeat labs in am -suspect contributing to AMS and now subsequent improvement with hydration  11. Polymyalgia rheumatica: Followed by [facility] on medrol 2mg /day PTA.  --On Medrol 4 mg/day since admission 12. PAF: Monitor HR TID--Monitor HR TID. Continue coreg bid.  13. Leucocytosis: Monitor for signs of infection. Likely steroid effect.  14. Rectal pain/Constipation: Miralax added to Senna               - also related to fractures themselves and associated skeletal muscle spasms.  11/12- has a few dilated loops of bowel, but no significant stool burden- LBM this AM  11/13 KUB today is pending. abdomen benign appearing today. Had 2 more bm's yesterday evening and this morning.---observe, review KUB when available 15. Hypothyroid  On supplement  16. Urinary retention: Will keep foley for a few more days until constipation addressed.   11/12- will try to remove Monday  17. Nausea  11/12- could be from dilated loops- will put on liquid diet and order compazine and Zofran if needed- likely heading towards ileus-   -11/13-appears more comfortable today -continue low IVFs D5NS  60cc/hour -continue liquid diet due to dilated loops of bowel-  -see #14 18. ABLA:  11/13 hgb sl decreased to 8.3, suspect dilutional component   -recheck tomorrow    LOS: 2 days A FACE TO FACE EVALUATION WAS PERFORMED  Meredith Staggers 10/08/2021, 9:11 AM

## 2021-10-08 NOTE — Evaluation (Signed)
Physical Therapy Assessment and Plan  Patient Details  Name: [name] MRN: [id number] Date of Birth: 10-Apr-1932  PT Diagnosis: Abnormal posture, Abnormality of gait, Difficulty walking, Muscle weakness, and Pain in R arm/shoulder and Hip Rehab Potential: Good ELOS: 3 weeks   Today's Date: 10/08/2021 PT Individual Time: 1000-1100 PT Individual Time Calculation (min): 60 min    Hospital Problem: Principal Problem:   Pelvic fracture ([place])   Past Medical History:  Past Medical History:  Diagnosis Date   Allergic rhinitis    Anemia    iron deficient   Aneurysm ([place])    Anxiety    Basal cell carcinoma    Chronic anemia    Chronic low back pain    Colon polyps    GERD (gastroesophageal reflux disease)    Hiatal hernia    HTN (hypertension)    Hypercholesteremia    Hyperlipidemia    Hypothyroidism    Insomnia    Left heart failure ([place])    Osteoporosis    S/P forteo treatment as of 02/2009   Pacemaker    Polymyalgia ([place])    rheumatica-steroids per Rheum   Systolic heart failure    Past Surgical History:  Past Surgical History:  Procedure Laterality Date   ABDOMINAL HYSTERECTOMY     ANGIOPLASTY     BACK SURGERY     CARDIAC CATHETERIZATION  10/16/06   CATARACT EXTRACTION     defibulator implant     EP IMPLANTABLE DEVICE N/A 04/10/2016   Procedure: BiV Pacemaker Insertion CRT-P;  Surgeon: Cheryl Lance, MD;  Location: [facility];  Service: Cardiovascular;  Laterality: N/A;   FOOT SURGERY     MITRAL VALVE REPAIR     skin cancer removal     THYROIDECTOMY     TONSILLECTOMY      Assessment & Plan Clinical Impression: [name] RH-female with history of HTN, chronic LBP, anemia, recent diagnosis of PAF- on eliquis, PPM, CKD, PMR-chronic steroids who was admitted on 10/01/21 with fall the day before and found to have transverse proximal humeral head fracture and right pubic rami and superior rami fractures. She was also noted to be anemic and had  hyperkalemia requiring dose of Lokelma. She was started on stress dose steroids and elquis held. Right calf laceration stapled and ortho consulted for input. Dr. Stann Thomas recommended non-operative management recommended with RUE placed in sling and to be NWB--ok to doff for ADL with elbow close to body and no ROM for at least 2-3 weeks. WBAT on RLE with recs to follow up in 2-3 weeks for follow up films. Hospital course significant for brief runs of asymptomatic VT as well as acute on chronic anemia requiring one unit PRBC. Lasix held due to worsening of renal status and foley placed due to urinary retention. Cefepime added due to leucocytosis on 11/08 due to sepsis and to complete 5 day course antibiotic regimen. She as   Patient currently requires mod with mobility secondary to muscle weakness and decreased standing balance and decreased postural control.  Prior to hospitalization, patient was independent  with mobility and lived with Alone in a House home.  Home access is 2Stairs to enter (pt states entry from garage w/ some sort of arm rail).  Patient will benefit from skilled PT intervention to maximize safe functional mobility, minimize fall risk, and decrease caregiver burden for planned discharge home with intermittent assist.  Anticipate patient will benefit from follow up [facility] at discharge.  PT -  End of Session Activity Tolerance: Tolerates 30+ min activity with multiple rests Endurance Deficit: Yes PT Assessment Rehab Potential (ACUTE/IP ONLY): Good PT Barriers to Discharge: Inaccessible home environment PT Barriers to Discharge Comments: STE home, unable to assess stairs yet. PT Patient demonstrates impairments in the following area(s): Balance;Safety;Endurance;Motor;Pain PT Transfers Functional Problem(s): Bed Mobility;Bed to Chair;Car;Furniture PT Locomotion Functional Problem(s): Ambulation;Wheelchair Mobility;Stairs PT Plan PT Intensity: Minimum of 1-2 x/day ,45 to 90 minutes PT  Duration Estimated Length of Stay: 3 weeks PT Treatment/Interventions: Ambulation/gait training;Neuromuscular re-education;Stair training;UE/LE Strength taining/ROM;Wheelchair propulsion/positioning;Discharge planning;Therapeutic Activities;UE/LE Coordination activities;Functional mobility training;Patient/family education;Therapeutic Exercise PT Transfers Anticipated Outcome(s): supervision PT Locomotion Anticipated Outcome(s): CGA to supervision w/ appropriate device. PT Recommendation Follow Up Recommendations: Home health PT Patient destination: Home Equipment Recommended: To be determined Equipment Details: Pt does not have AD at home.   PT Evaluation Precautions/Restrictions Precautions Precautions: Fall;Other (comment) Required Braces or Orthoses: Sling Restrictions Weight Bearing Restrictions: Yes RUE Weight Bearing: Non weight bearing RLE Weight Bearing: Weight bearing as tolerated LLE Weight Bearing: Weight bearing as tolerated Other Position/Activity Restrictions: Sling at all times except for during ADLs; keep elbow close to body when not in sling. No PROM/AROM at R shoulder for 2-3 weeks General Chart Reviewed: Yes Family/Caregiver Present: No Vital Signs Pain Pain Assessment Pain Scale: 0-10 Pain Score: 9  Faces Pain Scale: No hurt Pain Type: Acute pain Pain Location: Arm Pain Orientation: Right Pain Descriptors / Indicators: Sharp;Guarding Pain Frequency: Constant Pain Onset: On-going Patients Stated Pain Goal: 4 Pain Intervention(s): Medication (See eMAR) Pain Interference Pain Interference Pain Effect on Sleep: 3. Frequently Pain Interference with Therapy Activities: 4. Almost constantly Pain Interference with Day-to-Day Activities: 4. Almost constantly Home Living/Prior Functioning Home Living Available Help at Discharge: Family;Available 24 hours/day Type of Home: House Home Access: Stairs to enter (pt states entry from garage w/ some sort of arm  rail) Entrance Stairs-Number of Steps: 2 Entrance Stairs-Rails: Right Home Layout: One level  Lives With: Alone Prior Function Level of Independence: Independent with basic ADLs;Independent with homemaking with ambulation;Independent with transfers  Able to Take Stairs?: Yes Driving: Yes Vision/Perception  Vision - History Ability to See in Adequate Light: 0 Adequate  Cognition Overall Cognitive Status: Within Functional Limits for tasks assessed Arousal/Alertness: Awake/alert Orientation Level: Oriented X4 Focused Attention: Appears intact Comments: appears to be improved from yeasterday. Sensation Sensation Light Touch: Appears Intact Coordination Gross Motor Movements are Fluid and Coordinated: No Fine Motor Movements are Fluid and Coordinated: No Coordination and Movement Description: Decreased coordination bilaterally Heel Shin Test: not performed 2/2 hip precautions. Motor  Motor Motor: Within Functional Limits Motor - Skilled Clinical Observations: slow movement of B LEs but able to perform.   Trunk/Postural Assessment  Cervical Assessment Cervical Assessment: Exceptions to [facility] Thoracic Assessment Thoracic Assessment: Exceptions to [facility] Postural Control Postural Control: Deficits on evaluation  Balance Balance Balance Assessed: Yes Static Sitting Balance Static Sitting - Balance Support: Left upper extremity supported Static Sitting - Level of Assistance: 5: Stand by assistance Dynamic Sitting Balance Sitting balance - Comments: pt sat EOB w/ LUE support only, improving to w/o UE support. Extremity Assessment      RLE Assessment RLE Assessment: Exceptions to [facility] General Strength Comments: grossly 3+/5 hip LLE Assessment LLE Assessment: Within Functional Limits General Strength Comments: grossly 4+/5 throughout.  Care Tool Care Tool Bed Mobility Roll left and right activity   Roll left and right assist level: Maximal Assistance - Patient 25 - 49%     Sit  to lying activity        Lying to sitting on side of bed activity   Lying to sitting on side of bed assist level: the ability to move from lying on the back to sitting on the side of the bed with no back support.: Moderate Assistance - Patient 50 - 74%     Care Tool Transfers Sit to stand transfer   Sit to stand assist level: Moderate Assistance - Patient 50 - 74%    Chair/bed transfer   Chair/bed transfer assist level: Moderate Assistance - Patient 50 - 74%     Psychologist, counselling transfer activity did not occur: Safety/medical concerns        Care Tool Locomotion Ambulation Ambulation activity did not occur: Safety/medical concerns        Walk 10 feet activity Walk 10 feet activity did not occur: Safety/medical concerns       Walk 50 feet with 2 turns activity Walk 50 feet with 2 turns activity did not occur: Safety/medical concerns      Walk 150 feet activity Walk 150 feet activity did not occur: Safety/medical concerns      Walk 10 feet on uneven surfaces activity Walk 10 feet on uneven surfaces activity did not occur: Safety/medical concerns      Stairs Stair activity did not occur: Safety/medical concerns        Walk up/down 1 step activity Walk up/down 1 step or curb (drop down) activity did not occur: Safety/medical concerns      Walk up/down 4 steps activity Walk up/down 4 steps activity did not occur: Safety/medical concerns      Walk up/down 12 steps activity Walk up/down 12 steps activity did not occur: Safety/medical concerns      Pick up small objects from floor Pick up small object from the floor (from standing position) activity did not occur: Safety/medical concerns      Wheelchair Is the patient using a wheelchair?: Yes Type of Wheelchair: Manual   Wheelchair assist level: Total Assistance - Patient < 25% (Pt uses LUE, but w/c too tall to attempt use of LEs.) Max wheelchair distance: 20'  Wheel 50 feet with 2  turns activity   Assist Level: Dependent - Patient 0%  Wheel 150 feet activity   Assist Level: Dependent - Patient 0%    Refer to Care Plan for Long Term Goals  SHORT TERM GOAL WEEK 1 PT Short Term Goal 1 (Week 1): Pt will transfer sup to sit w/ min A. PT Short Term Goal 2 (Week 1): Pt will transfer sit to stand w/ min A PT Short Term Goal 3 (Week 1): Pt will amb w/ least restrictive device x 15' w/ min A.  Recommendations for other services: None   Skilled Therapeutic Intervention Evaluation completed (see details above and below) with education on PT POC and goals and individual treatment initiated with focus on  strengthening, endurance, pain control, transfers and gait.  Pt presents supine in bed , just finishing w/ brief change w/ NT, handed off to PT.  Pt agreeable to participate w/ therapy.  Pt able to flex B knee to thread catheter and feet through legs.  Pt then performed bridging to pull pants up w/ mod A over hips.  Pt bridged to bring hips to EOB.  Pt required mod A and verbal cues for sup to sit w/ use of L elbow.  Pt then required mod A  of Chux to scoot to EOB.  Pt able to sit EOB w/ LUE assist, improving to no UE assist.  Pt performed sit to stand transfer w/ mod/HHA and then stepped to w/c.  Pt required mod A to scoot back into w/c.  Shorter w/c brought into room for subsequent use to enable LE use for locomotion.  Pt able to use L UE w/ total A to locomote x 20' only.  Pt wheeled to gyms and dayroom to acquaint pt w/ therapy including car and stairs, although unable to perform this date.  Pt remained sitting in w/c w/ chair alarm on and all needs in reach.  NT aware of performance and use of Stedy or assist of 2 for stand pivot.  Mobility Bed Mobility Bed Mobility: Supine to Sit Supine to Sit: Moderate Assistance - Patient 50-74% Transfers Transfers: Sit to Stand;Stand to Sit;Stand Pivot Transfers Sit to Stand: Moderate Assistance - Patient 50-74% Stand to Sit: Moderate  Assistance - Patient 50-74% Stand Pivot Transfers: Moderate Assistance - Patient 50 - 74% Stand Pivot Transfer Details: Verbal cues for technique;Verbal cues for precautions/safety (pt able to step bed > w/c w/ HHA/mod A.) Stand Pivot Transfer Details (indicate cue type and reason): pt performed w/ foot clearance to step to w/c. Transfer (Assistive device): 1 person hand held assist Locomotion  Gait Ambulation: No (pt only able to take 3-4 steps bed > w/c w/ HHA/mod A.) Stairs / Additional Locomotion Stairs: No Wheelchair Mobility Wheelchair Mobility: Yes Wheelchair Assistance: Total Assistance - Patient <25% Wheelchair Propulsion: Left upper extremity (unable to use LEs 2/2 height of w/c, lower w/c left in room.) Wheelchair Parts Management: Needs assistance Distance: 20   Discharge Criteria: Patient will be discharged from PT if patient refuses treatment 3 consecutive times without medical reason, if treatment goals not met, if there is a change in medical status, if patient makes no progress towards goals or if patient is discharged from hospital.  The above assessment, treatment plan, treatment alternatives and goals were discussed and mutually agreed upon: by patient  Ladoris Gene 10/08/2021, 12:29 PM

## 2021-10-08 NOTE — Discharge Instructions (Signed)
Inpatient Rehab Discharge Instructions  [name] Discharge date and time: No discharge date for patient encounter.   Activities/Precautions/ Functional Status: Activity: Nonweightbearing right upper extremity.  Weightbearing as tolerated lower extremity Diet: Regular Wound Care: Routine skin checks Functional status:  ___ No restrictions     ___ Walk up steps independently ___ 24/7 supervision/assistance   ___ Walk up steps with assistance ___ Intermittent supervision/assistance  ___ Bathe/dress independently ___ Walk with walker     __x_ Bathe/dress with assistance ___ Walk Independently    ___ Shower independently ___ Walk with assistance    ___ Shower with assistance ___ No alcohol     ___ Return to work/school ________  Special Instructions: No driving smoking or alcohol   My questions have been answered and I understand these instructions. I will adhere to these goals and the provided educational materials after my discharge from the hospital.  Patient/Caregiver Signature _______________________________ Date __________  Clinician Signature _______________________________________ Date __________  Please bring this form and your medication list with you to all your follow-up doctor's appointments.

## 2021-10-09 LAB — URINALYSIS, ROUTINE W REFLEX MICROSCOPIC
Bilirubin Urine: NEGATIVE
Glucose, UA: NEGATIVE mg/dL
Ketones, ur: NEGATIVE mg/dL
Leukocytes,Ua: NEGATIVE
Nitrite: NEGATIVE
Protein, ur: 30 mg/dL — AB
Specific Gravity, Urine: 1.013 (ref 1.005–1.030)
pH: 6 (ref 5.0–8.0)

## 2021-10-09 LAB — BASIC METABOLIC PANEL
Anion gap: 6 (ref 5–15)
BUN: 44 mg/dL — ABNORMAL HIGH (ref 8–23)
CO2: 21 mmol/L — ABNORMAL LOW (ref 22–32)
Calcium: 8.4 mg/dL — ABNORMAL LOW (ref 8.9–10.3)
Chloride: 109 mmol/L (ref 98–111)
Creatinine, Ser: 1.13 mg/dL — ABNORMAL HIGH (ref 0.44–1.00)
GFR, Estimated: 47 mL/min — ABNORMAL LOW (ref >60)
Glucose, Bld: 87 mg/dL (ref 70–99)
Potassium: 5.4 mmol/L — ABNORMAL HIGH (ref 3.5–5.1)
Sodium: 136 mmol/L (ref 135–145)

## 2021-10-09 LAB — CBC
HCT: 26.3 % — ABNORMAL LOW (ref 36.0–46.0)
Hemoglobin: 8.5 g/dL — ABNORMAL LOW (ref 12.0–15.0)
MCH: 31.7 pg (ref 26.0–34.0)
MCHC: 32.3 g/dL (ref 30.0–36.0)
MCV: 98.1 fL (ref 80.0–100.0)
Platelets: 294 10*3/uL (ref 150–400)
RBC: 2.68 MIL/uL — ABNORMAL LOW (ref 3.87–5.11)
RDW: 15.4 % (ref 11.5–15.5)
WBC: 11.7 10*3/uL — ABNORMAL HIGH (ref 4.0–10.5)
nRBC: 0 % (ref 0.0–0.2)

## 2021-10-09 MED ORDER — METHOCARBAMOL 500 MG PO TABS
250.0000 mg | ORAL_TABLET | Freq: Three times a day (TID) | ORAL | Status: DC
  Administered 2021-10-09 – 2021-10-11 (×7): 250 mg via ORAL
  Filled 2021-10-09 (×7): qty 1

## 2021-10-09 MED ORDER — CHLORHEXIDINE GLUCONATE CLOTH 2 % EX PADS
6.0000 | MEDICATED_PAD | Freq: Two times a day (BID) | CUTANEOUS | Status: DC
  Administered 2021-10-09 – 2021-10-11 (×5): 6 via TOPICAL

## 2021-10-09 NOTE — Progress Notes (Signed)
Physical Therapy Session Note  Patient Details  Name: [name] MRN: [id number] Date of Birth: [birth year]  Today's Date: 10/09/2021 PT Individual Time: 0962-8366 PT Individual Time Calculation (min): 72 min   Short Term Goals: Week 1:  PT Short Term Goal 1 (Week 1): Pt will transfer sup to sit w/ min A. PT Short Term Goal 2 (Week 1): Pt will transfer sit to stand w/ min A PT Short Term Goal 3 (Week 1): Pt will amb w/ least restrictive device x 15' w/ min A.  Skilled Therapeutic Interventions/Progress Updates:     Pt received supine in bed. Says she feels horrible and "wants [her] old life back." Pt says she hurts everywhere and feels nauseous as well. Pain number not provided. PT provides bracing of L arm, rest breaks, and mobility to manage pain. Pt is agreeable to therapy. PT notes that pt has soiled brief. Pt performs rolling to the L with modA to assist with doffing brief, performing pericare, and donning new brief. Pt performs supine to sit with modA/maxA with cues for sequencing and positioning. Sit to stand with modA and stand step transfer to [facility] with modA and cues for upright gaze and sequencing. Pt looking directly down at ground with very kyphotic posture and has difficulty clearing R foot off ground. Pt noted to have additional bowel incontinence during transfer. WC transport into restroom. Stand pivot transfer to toilet with grab bars and modA. Soiled brief removed. Following extended seated rest break on toilet, pt stands and is able to maintain standing with minA while PT performs pericare. Pt verbalizes increase in nausea. Seated rest break prior to transfer back to [facility] with modA. WC transport to gym. Pt needs extra time to rest due to nausea. PT adjusts R arm sling for improved stability and fit. Pt performs sit to stand with mirror positioned for visual feedback. PT provides modA and consistent cues to extend trunk and increase upright posture due to pt's posterior bias. Pt  continuing to complain of nausea in standing. Assised back to siiting. WC transport to room. Pt left seated in WC with alarm intact and all needs within reach. RN aware of nausea.  Therapy Documentation Precautions:  Precautions Precautions: Fall, Other (comment) Precaution Comments: Significant bruising to face/neck R>L, High fall risk Required Braces or Orthoses: Sling Restrictions Weight Bearing Restrictions: Yes RUE Weight Bearing: Non weight bearing RLE Weight Bearing: Weight bearing as tolerated LLE Weight Bearing: Weight bearing as tolerated Other Position/Activity Restrictions: Sling at all times except for during ADLs; keep elbow close to body when not in sling. No PROM/AROM at R shoulder for 2-3 weeks   Therapy/Group: Individual Therapy  Breck Coons, PT, DPT 10/09/2021, 3:49 PM

## 2021-10-09 NOTE — Progress Notes (Signed)
Occupational Therapy Session Note  Patient Details  Name: [name] MRN: [id number] Date of Birth: [birth year]  Today's Date: 10/09/2021 OT Individual Time: 0740-0800 OT Individual Time Calculation (min): 20 min  and Today's Date: 10/09/2021 OT Missed Time: 40 Minutes Missed Time Reason: Patient ill (comment) (nausea)   Short Term Goals: Week 1:  OT Short Term Goal 1 (Week 1): Patient will don UB clothing with Mod A seated EOB. OT Short Term Goal 2 (Week 1): Patient will complete sit to stand transfer with Max A and LRAD. OT Short Term Goal 3 (Week 1): Patient will complete 1/3 parts of toileting task with Mod A. OT Short Term Goal 4 (Week 1): Patient wil maintain static sitting at EOB with supervision A.  Skilled Therapeutic Interventions/Progress Updates:   Pt greeted semi-reclined in bed with MD present. Pt reported continued nausea with MD ordering Zofran to help with nausea. Per discussion with MD, will apply kinesiotape to R hand to try to help with swelling. OT placed kinesiotape for edema management and educated on skin protection and elevation as well. Pt declined further participation this morning due to nausea and fatigue.    Therapy Documentation Precautions:  Precautions Precautions: Fall, Other (comment) Precaution Comments: Significant bruising to face/neck R>L, High fall risk Required Braces or Orthoses: Sling Restrictions Weight Bearing Restrictions: Yes RUE Weight Bearing: Non weight bearing RLE Weight Bearing: Weight bearing as tolerated LLE Weight Bearing: Weight bearing as tolerated Other Position/Activity Restrictions: Sling at all times except for during ADLs; keep elbow close to body when not in sling. No PROM/AROM at R shoulder for 2-3 weeks General: General OT Amount of Missed Time: 40 Minutes  Pain: No number given. Pt reported pain in her R arm.   Therapy/Group: Individual Therapy  [name] 10/09/2021, 3:04 PM

## 2021-10-09 NOTE — IPOC Note (Signed)
Overall Plan of Care [facility]) Patient Details Name: [name] MRN: [id number] DOB: 1931-12-01  Admitting Diagnosis: Pelvic fracture [facility])  Hospital Problems: Principal Problem:   Pelvic fracture ([place])     Functional Problem List: Nursing Bladder, Bowel, Endurance, Medication Management, Pain, Safety, Skin Integrity  PT Balance, Safety, Endurance, Motor, Pain  OT Balance, Cognition, Endurance, Motor, Pain, Safety, Skin Integrity  SLP    TR         Basic ADL's: OT Grooming, Bathing, Dressing, Toileting, Eating     Advanced  ADL's: OT       Transfers: PT Bed Mobility, Bed to Chair, Car, Manufacturing systems engineer, Metallurgist: PT Ambulation, Emergency planning/management officer, Stairs     Additional Impairments: OT Fuctional Use of Upper Extremity  SLP        TR      Anticipated Outcomes Item Anticipated Outcome  Self Feeding Independent  Swallowing      Basic self-care  Supervision to [employer]  Supervision A   Bathroom Transfers Supervision to [employer] A  Bowel/Bladder  min assist  Transfers  supervision  Locomotion  CGA to supervision w/ appropriate device.  Communication     Cognition     Pain  <3  Safety/Judgment  min assist and no falls   Therapy Plan: PT Intensity: Minimum of 1-2 x/day ,45 to 90 minutes PT Duration Estimated Length of Stay: 3 weeks OT Intensity: Minimum of 1-2 x/day, 45 to 90 minutes OT Frequency: 5 out of 7 days OT Duration/Estimated Length of Stay: 2.5 weeks     Due to the current state of emergency, patients may not be receiving their 3-hours of Medicare-mandated therapy.   Team Interventions: Nursing Interventions Patient/Family Education, Bladder Management, Bowel Management, Pain Management, Medication Management, Skin Care/Wound Management, Discharge Planning  PT interventions Ambulation/gait training, Neuromuscular re-education, Stair training, UE/LE Strength taining/ROM, Wheelchair propulsion/positioning,  Discharge planning, Therapeutic Activities, UE/LE Coordination activities, Functional mobility training, Patient/family education, Therapeutic Exercise  OT Interventions Balance/vestibular training, Cognitive remediation/compensation, Community reintegration, Discharge planning, DME/adaptive equipment instruction, Functional mobility training, Pain management, Patient/family education, Psychosocial support, Self Care/advanced ADL retraining, Skin care/wound managment, Therapeutic Activities, Therapeutic Exercise, UE/LE Strength taining/ROM, UE/LE Coordination activities, Visual/perceptual remediation/compensation  SLP Interventions    TR Interventions    SW/CM Interventions     Barriers to Discharge MD  Medical stability, Home enviroment access/loayout, Incontinence, Neurogenic bowel and bladder, Wound care, Lack of/limited family support, Weight, Weight bearing restrictions, and Behavior  Nursing Home environment access/layout, Decreased caregiver support, Incontinence, Wound Care, Weight bearing restrictions, Medication compliance Discharging to daughter's home. 1-level with 3-4 steps to enter with left and right rails. Daughters can provide assist 24/7 at discharge.  PT Inaccessible home environment STE home, unable to assess stairs yet.  OT Inaccessible home environment 1 STE  SLP      SW       Team Discharge Planning: Destination: PT-Home ,OT- Home , SLP-  Projected Follow-up: PT-Home health PT, OT-  Home health OT, 24 hour supervision/assistance, SLP-  Projected Equipment Needs: PT-To be determined, OT- To be determined, SLP-  Equipment Details: PT-Pt does not have AD at home., OT-  Patient/family involved in discharge planning: PT- Patient,  OT-Patient, SLP-   MD ELOS: 3 weeks Medical Rehab Prognosis:  Fair Assessment: Pt is an 87 yr old female with multiple fx'ss s/p a fall- cognition also impaired- but not clear if due to medical issues or fall- has resolving ileus-  and ARI on  CKD- R hand is very swollen-  Also K+ 5.4- will recheck and might need treatment-  Will have OT help with ktaping of R hand as well   Goals supervision to [facility]  See Team Conference Notes for weekly updates to the plan of care

## 2021-10-09 NOTE — Progress Notes (Signed)
Nurse concerned that patient is lethargic with soft BP--did participate with therapy today. Will decrease robaxin to 250 mg tid as may be contributing to soft BP in addition to zofran and compazine which was given this am. Advised to monitor frequently and to hold narcotics till BP recovers.

## 2021-10-09 NOTE — Progress Notes (Signed)
PA Olin Hauser notified of pt soft BP's, advised to hold oxycodone. Sheela Stack, LPN

## 2021-10-09 NOTE — Care Management (Signed)
Inpatient [facility] Individual Statement of Services  Patient Name:  [name]  Date:  10/09/2021  Welcome to the [facility].  Our goal is to provide you with an individualized program based on your diagnosis and situation, designed to meet your specific needs.  With this comprehensive rehabilitation program, you will be expected to participate in at least 3 hours of rehabilitation therapies Monday-Friday, with modified therapy programming on the weekends.  Your rehabilitation program will include the following services:  Physical Therapy (PT), Occupational Therapy (OT), 24 hour per day rehabilitation nursing, Therapeutic Recreaction (TR), Psychology, Neuropsychology, Care Coordinator, Rehabilitation Medicine, [place], and Other  Weekly team conferences will be held on Tuesdays to discuss your progress.  Your Inpatient Rehabilitation Care Coordinator will talk with you frequently to get your input and to update you on team discussions.  Team conferences with you and your family in attendance may also be held.  Expected length of stay: 2-3 weeks    Overall anticipated outcome: Supervision  Depending on your progress and recovery, your program may change. Your Inpatient Rehabilitation Care Coordinator will coordinate services and will keep you informed of any changes. Your Inpatient Rehabilitation Care Coordinator's name and contact numbers are listed  below.  The following services may also be recommended but are not provided by the [facility] will be made to provide these services after discharge if needed.  Arrangements include referral to agencies that provide these services.  Your insurance has been verified to be:  Medicare A/B  Your primary doctor is:  Lona Kettle  Pertinent information will be shared with your doctor and your insurance company.  Inpatient Rehabilitation Care Coordinator:  Cathleen Corti [phone] or [phone]  Information discussed with and copy given to patient by: Rana Snare, 10/09/2021, 1:15 PM

## 2021-10-09 NOTE — Progress Notes (Signed)
Occupational Therapy Note  Patient Details  Name: [name] MRN: [id number] Date of Birth: 1932-10-29  Today's Date: 10/09/2021 OT Missed Time: 20 Minutes Missed Time Reason: Patient fatigue  Pt declined OT session as pt reports fatigue, offered ADLs and OOB mobility with pt declining, will check back as time allows to make up for missed minutes.    Corinne Ports [facility] 10/09/2021, 3:34 PM

## 2021-10-09 NOTE — Progress Notes (Signed)
PROGRESS NOTE   Subjective/Complaints: Pt reports still nauseated- asking for pain meds- BP soft, so Oxycodone held per chart.   Also c/o being tired.   Therapy noted R hand still swollen- asked them to do [place] for edema control.   ROS: limited by cognition/behavior  Objective:   DG Abd Portable 1V  Result Date: 10/08/2021 CLINICAL DATA:  87 year old female with nausea. EXAM: PORTABLE ABDOMEN - 1 VIEW COMPARISON:  10/07/2021 FINDINGS: Scattered loops of gas containing small bowel and colon without significant distension, decreased from comparison. There is gaseous distension of the stomach. Similar appearing postsurgical changes after multilevel thoracolumbar vertebral body cement augmentation. Similar appearance of previously characterized right pubic ramus fracture. IMPRESSION: Resolving ileus. Electronically Signed   By: Ruthann Cancer M.D.   On: 10/08/2021 10:08   Recent Labs    10/08/21 0531 10/09/21 0510  WBC 10.3 11.7*  HGB 8.3* 8.5*  HCT 25.4* 26.3*  PLT 227 294   Recent Labs    10/08/21 0531 10/09/21 0510  NA 135 136  K 5.0 5.4*  CL 109 109  CO2 19* 21*  GLUCOSE 93 87  BUN 54* 44*  CREATININE 1.16* 1.13*  CALCIUM 7.6* 8.4*    Intake/Output Summary (Last 24 hours) at 10/09/2021 1324 Last data filed at 10/09/2021 1322 Gross per 24 hour  Intake 840 ml  Output 1450 ml  Net -610 ml        Physical Exam: Vital Signs Blood pressure (!) 106/48, pulse 80, temperature 98.5 F (36.9 C), temperature source Oral, resp. rate 16, height 4\' 8"  (1.422 m), weight 51.3 kg, SpO2 99 %.     General: awake, alert, appropriate, sleepy; sedated; sitting up in bed; picking at liquid diet; NAD HENT: conjugate gaze; oropharynx moist- facial bruising slightly better; knot on R forehead CV: regular rate; no JVD Pulmonary: CTA B/L; no W/R/R- good air movement- decreased at bases B/L  GI: soft, c/o nausea; burping a  lot; slightly TTP diffusely; ND; hyperactive BS Psychiatric:  asking for pain meds; flat; quiet Neurological:knows at [facility]; unsure of day/date/month;;    Musculoskeletal:        General: Swelling and tenderness (RUE and pelvis) present.  GU: Foley remains in place Skin:    Comments: Bruising RUE, bilateral LE's. Both legs with dressings in place midway.  R hand very swollen- in sling- hand still very swollen- and not propped up anymore- was on pillow but fell off.  Neurological:     Mental Status: Much more alert. She is oriented to person, place. Speech clearer, phonation improved.     Cranial Nerves: No cranial nerve deficit.     Sensory: No sensory deficit.      Normal language. Motor limited by pain RUE and bilateral LE's. LUE grossly 3-4/5. LE's 2/5 prox to 4/5 distally.        Assessment/Plan: 1. Functional deficits which require 3+ hours per day of interdisciplinary therapy in a comprehensive inpatient rehab setting. Physiatrist is providing close team supervision and 24 hour management of active medical problems listed below. Physiatrist and rehab team continue to assess barriers to discharge/monitor patient progress toward functional and medical goals  Care Tool:  Bathing  Bathing activity did not occur: Refused           Bathing assist       Upper Body Dressing/Undressing Upper body dressing   What is the patient wearing?: Pull over shirt, Orthosis Orthosis activity level: Performed by helper  Upper body assist Assist Level: Maximal Assistance - Patient 25 - 49%    Lower Body Dressing/Undressing Lower body dressing      What is the patient wearing?: Incontinence brief, Pants     Lower body assist Assist for lower body dressing: Dependent - Patient 0%     Toileting Toileting    Toileting assist Assist for toileting: Dependent - Patient 0%     Transfers Chair/bed transfer  Transfers assist  Chair/bed transfer activity did not occur:  Refused  Chair/bed transfer assist level: Moderate Assistance - Patient 50 - 74%     Locomotion Ambulation   Ambulation assist   Ambulation activity did not occur: Safety/medical concerns          Walk 10 feet activity   Assist  Walk 10 feet activity did not occur: Safety/medical concerns        Walk 50 feet activity   Assist Walk 50 feet with 2 turns activity did not occur: Safety/medical concerns         Walk 150 feet activity   Assist Walk 150 feet activity did not occur: Safety/medical concerns         Walk 10 feet on uneven surface  activity   Assist Walk 10 feet on uneven surfaces activity did not occur: Safety/medical concerns         Wheelchair     Assist Is the patient using a wheelchair?: Yes Type of Wheelchair: Manual    Wheelchair assist level: Total Assistance - Patient < 25% (Pt uses LUE, but w/c too tall to attempt use of LEs.) Max wheelchair distance: 20'    Wheelchair 50 feet with 2 turns activity    Assist        Assist Level: Maximal Assistance - Patient 25 - 49%   Wheelchair 150 feet activity     Assist      Assist Level: Total Assistance - Patient < 25%   Blood pressure (!) 106/48, pulse 80, temperature 98.5 F (36.9 C), temperature source Oral, resp. rate 16, height 4\' 8"  (1.422 m), weight 51.3 kg, SpO2 99 %.  Medical Problem List and Plan: 1.  Functional deficits secondary to polytrauma including right proximal humerus fx, pelvic fx's             -patient may shower             -ELOS/Goals: 10-12 days, supervision to min assist goals  Con't PT/OT- CIR- pain and nausea/ileus and cognition limiting 2.  Antithrombotics: -DVT/anticoagulation:  Pharmaceutical: Lovenox--Eliquis has been on hold             -antiplatelet therapy: N/A 3. Chronic LBP/compression Fx/Pain Management:  Was on hydrocodone prn PTA. --Oxycodone prn-- .  -added scheduled robaxin as well--tolerating 11/13-discussed pain mgt  with patient. Told her that we would not be adding iv morphine. She does use hydrocodone as outpt  -will add scheduled oxycodone 5mg  at 0700 and 1200 daily outside of her prn doses. 11/14- Oxy held due to soft BP- but overall, will con't regimen 4. Mood: LCSW to follow for evaluation and support.              -antipsychotic agents: N/A 5. Neuropsych:  This patient is ?capable of making decisions on her own behalf. 6. Skin/Wound Care: Monitor wound for healing. Routine pressure relief measures.  7. Fluids/Electrolytes/Nutrition: Strict I/O.   8. Right humerus fracture: NWB RUE with sling at all times --may doff for ADL  11/12- hand swollen- continue to elevate  11/14- asked OT to do k taping for swelling 9. Pelvic Fx: WBAT BLE 10. Acute on chronic renal failure with hyperkalemia:  11/13 BUN/Cr continue to improve -continue current rate of IVF -repeat labs in am -suspect contributing to AMS and now subsequent improvement with hydration  11/13- Cr 1.13 and BUN down to 44- closer to baseline 11. Polymyalgia rheumatica: Followed by Algernon Huxley on medrol 2mg /day PTA.  --On Medrol 4 mg/day since admission 12. PAF: Monitor HR TID--Monitor HR TID. Continue coreg bid.  13. Leucocytosis: Monitor for signs of infection. Likely steroid effect.   11/14- WBC up to 11.7 from 10- will check U/A and Cx, esp since has Foley 14. Rectal pain/Constipation: Miralax added to Senna               - also related to fractures themselves and associated skeletal muscle spasms.  11/12- has a few dilated loops of bowel, but no significant stool burden- LBM this AM  11/13 KUB today is pending. abdomen benign appearing today. Had 2 more bm's yesterday evening and this morning.---observe, review KUB when available  11/14- 3 BM's this AM- KUB said resolving ileus- put back on regular diet today.  15. Hypothyroid  On supplement  16. Urinary retention: Will keep foley for a few more days until constipation addressed.    11/12- will try to remove Monday  17. Nausea  11/12- could be from dilated loops- will put on liquid diet and order compazine and Zofran if needed- likely heading towards ileus-   -11/13-appears more comfortable today -continue low IVFs D5NS  60cc/hour -continue liquid diet due to dilated loops of bowel-  -see #14 11/14- 3 BM's this AM- 2 of them large- will put back on regular diet; IVFs stopped- c/o still nauseated, but more amenable to therapy- will monitor and con't Zofran prn first, then compazine.   18. ABLA:  11/13 hgb sl decreased to 8.3, suspect dilutional component  11/14- Hb back up a little to 8.5- con't to monitor    LOS: 3 days A FACE TO FACE EVALUATION WAS PERFORMED  Robin Pafford 10/09/2021, 1:24 PM

## 2021-10-09 NOTE — Progress Notes (Signed)
Inpatient Rehabilitation  Patient information reviewed and entered into eRehab system by Jacqulene Huntley M. Noma Quijas, M.A., CCC/SLP, PPS Coordinator.  Information including medical coding, functional ability and quality indicators will be reviewed and updated through discharge.    

## 2021-10-09 NOTE — Progress Notes (Addendum)
Patient ID: [name], female   DOB: 1932-09-30, 87 y.o.   MRN: [id number]  SW made efforts to meet with pt to complete assessment but pt appeared to be having trouble answering SW questions. Pt confirms that she is tired. SW will make efforts to complete assessment another time.   SW spoke with pt dtr [name] [phone]) to introduce self, explain role, and discuss discharge process. Confirms pt is going to d/c to their sister [name]'s home ([address], [place], [place] [address]). States sister can provide 24/7 care. Family will decide who is the main contact to coordinate discharge needs. Aware SW will follow-up after team conference tomorrow.  Loralee Pacas, MSW, [place] Office: [phone] Cell: [phone] Fax: [phone]

## 2021-10-10 LAB — CBC WITH DIFFERENTIAL/PLATELET
Abs Immature Granulocytes: 0.54 10*3/uL — ABNORMAL HIGH (ref 0.00–0.07)
Basophils Absolute: 0.1 10*3/uL (ref 0.0–0.1)
Basophils Relative: 0 %
Eosinophils Absolute: 0.4 10*3/uL (ref 0.0–0.5)
Eosinophils Relative: 4 %
HCT: 26.3 % — ABNORMAL LOW (ref 36.0–46.0)
Hemoglobin: 8.7 g/dL — ABNORMAL LOW (ref 12.0–15.0)
Immature Granulocytes: 5 %
Lymphocytes Relative: 20 %
Lymphs Abs: 2.4 10*3/uL (ref 0.7–4.0)
MCH: 32.5 pg (ref 26.0–34.0)
MCHC: 33.1 g/dL (ref 30.0–36.0)
MCV: 98.1 fL (ref 80.0–100.0)
Monocytes Absolute: 1.3 10*3/uL — ABNORMAL HIGH (ref 0.1–1.0)
Monocytes Relative: 10 %
Neutro Abs: 7.5 10*3/uL (ref 1.7–7.7)
Neutrophils Relative %: 61 %
Platelets: 295 10*3/uL (ref 150–400)
RBC: 2.68 MIL/uL — ABNORMAL LOW (ref 3.87–5.11)
RDW: 15.5 % (ref 11.5–15.5)
WBC: 12.1 10*3/uL — ABNORMAL HIGH (ref 4.0–10.5)
nRBC: 0 % (ref 0.0–0.2)

## 2021-10-10 LAB — BASIC METABOLIC PANEL
Anion gap: 9 (ref 5–15)
BUN: 38 mg/dL — ABNORMAL HIGH (ref 8–23)
CO2: 19 mmol/L — ABNORMAL LOW (ref 22–32)
Calcium: 8.1 mg/dL — ABNORMAL LOW (ref 8.9–10.3)
Chloride: 107 mmol/L (ref 98–111)
Creatinine, Ser: 1.22 mg/dL — ABNORMAL HIGH (ref 0.44–1.00)
GFR, Estimated: 43 mL/min — ABNORMAL LOW (ref >60)
Glucose, Bld: 86 mg/dL (ref 70–99)
Potassium: 5.3 mmol/L — ABNORMAL HIGH (ref 3.5–5.1)
Sodium: 135 mmol/L (ref 135–145)

## 2021-10-10 NOTE — Progress Notes (Signed)
Patient ID: [name], female   DOB: 1932/09/01, 87 y.o.   MRN: [id number]  SW met with pt in room to provide updates from team conference, and d/c date is 3-3.5 weeks. SW called pt dtr [name] while in room to provide updates. She does confirm that she will be discharging to her home. Reports that she is on a RW for a knee replacement needed. Understands SW will follow-up after team conference next week.  [name], MSW, [place] Office: [phone] Cell: [phone] Fax: [phone]

## 2021-10-10 NOTE — Patient Care Conference (Signed)
Inpatient RehabilitationTeam Conference and Plan of Care Update Date: 10/10/2021   Time: 10:40 AM    Patient Name: [name]      Medical Record Number: [id number]  Date of Birth: 03-20-1932 Sex: Female         Room/Bed: 4W20C/4W20C-01 Payor Info: Payor: MEDICARE / Plan: MEDICARE PART A AND B / Product Type: *No Product type* /    Admit Date/Time:  10/06/2021  6:05 PM  Primary Diagnosis:  Pelvic fracture [facility])  Hospital Problems: Principal Problem:   Pelvic fracture [facility])    Expected Discharge Date: Expected Discharge Date:  (3-3.5 weeks)  Team Members Present: Physician leading conference: Dr. Alger Simons Social Worker Present: Loralee Pacas, [place] Nurse Present: Dorthula Nettles, RN PT Present: Tereasa Coop, PT OT Present: Cherylynn Ridges, OT PPS Coordinator present : Gunnar Fusi, SLP     Current Status/Progress Goal Weekly Team Focus  Bowel/Bladder   cont. of Bowel. Foley in place. Last BM-11/14  gain full cont.  assess q shift and PRN   Swallow/Nutrition/ Hydration             ADL's   Max/total A BADL tasks, Mod A transfers  Supervision/min A  OOB tolerance, transfers, self-care retraining   Mobility   modA bed mobility, modA sit to stand and stand step transfer         Communication             Safety/Cognition/ Behavioral Observations            Pain   pt denies pain  pain<3  assess apin q shift and PRN   Skin   multiple eccymosis( BUE, faces, abdomen), skin tears to BUE, Laceration to BLE  proper healing  assess skin q shift and PRN     Discharge Planning:  D/c to home with dtr [name] who will provide 24/7 care.   Team Discussion: Doesn't appear to have a brain injury. Goes to the pain clinic. Was appropriate today. Needs medication before therapy. Kidney function has improved. Foley in place due to urinary retention. Incontinent bowel, implementing timed toileting schedule. 10/10 pain in mid back. Multiple skin tears and lacerations. Going  home with her daughter. Max/total assist, very self-limiting. Resist rolling in the bed and performing self-task. Mod assist with PT. Supervision /min assist goals. Patient on target to meet rehab goals: Progressing slow and self-limiting behaviors  *See Care Plan and progress notes for long and short-term goals.   Revisions to Treatment Plan:  Continue foley catheter  Teaching Needs: Family education, medication/pain management, skin/wound care, safety awareness, bowel/bladder management, transfer training, etc.   Current Barriers to Discharge: Decreased caregiver support, Home enviroment access/layout, Incontinence, Neurogenic bowel and bladder, Wound care, Lack of/limited family support, Weight bearing restrictions, and Medication compliance  Possible Resolutions to Barriers: Family education Educate weight bearing precautions Order DME Set-up HH/Outpatient Begin voiding trial     Medical Summary Current Status: polytrauma after fall with right humeral fracture and pelvic fx's. a lot of pain. foley catheter in place. chronic pain syndrome. no tbi  Barriers to Discharge: Medical stability   Possible Resolutions to Barriers/Weekly Focus: balancing pain mgt with arousal, voiding trial, skin mgt   Continued Need for Acute Rehabilitation Level of Care: The patient requires daily medical management by a physician with specialized training in physical medicine and rehabilitation for the following reasons: Direction of a multidisciplinary physical rehabilitation program to maximize functional independence : Yes Medical management of patient stability for increased activity  during participation in an intensive rehabilitation regime.: Yes Analysis of laboratory values and/or radiology reports with any subsequent need for medication adjustment and/or medical intervention. : Yes   I attest that I was present, lead the team conference, and concur with the assessment and plan of the  team.   Cristi Loron 10/10/2021, 1:53 PM

## 2021-10-10 NOTE — Progress Notes (Signed)
PROGRESS NOTE   Subjective/Complaints: Pt alert. Pain controlled this morning.   ROS: Patient denies fever, rash, sore throat, blurred vision, nausea, vomiting, diarrhea, cough, shortness of breath or chest pain,  , headache, or mood change.   Objective:   No results found. Recent Labs    10/09/21 0510 10/10/21 0506  WBC 11.7* 12.1*  HGB 8.5* 8.7*  HCT 26.3* 26.3*  PLT 294 295   Recent Labs    10/09/21 0510 10/10/21 0506  NA 136 135  K 5.4* 5.3*  CL 109 107  CO2 21* 19*  GLUCOSE 87 86  BUN 44* 38*  CREATININE 1.13* 1.22*  CALCIUM 8.4* 8.1*    Intake/Output Summary (Last 24 hours) at 10/10/2021 0953 Last data filed at 10/10/2021 0830 Gross per 24 hour  Intake 420 ml  Output 1900 ml  Net -1480 ml        Physical Exam: Vital Signs Blood pressure (!) 158/58, pulse 95, temperature 99.2 F (37.3 C), temperature source Oral, resp. rate 17, height 4\' 8"  (1.422 m), weight 51.3 kg, SpO2 96 %.  Constitutional: No distress . Vital signs reviewed. HEENT:   EOMI, oral membranes moist Neck: supple Cardiovascular: RRR without murmur. No JVD    Respiratory/Chest: CTA Bilaterally without wheezes or rales. Normal effort    GI/Abdomen: BS +, non-tender, non-distended Ext: no clubbing, cyanosis, or edema Psych: pleasant and cooperative, engaging   Musculoskeletal:        General: Swelling and tenderness (RUE and pelvis) present.  GU: Foley remains in place Skin:    Comments: Bruising RUE, bilateral LE's. Both legs with dressings in place midway.  R hand less swollen- in sling- k tape.   Neurological:     Mental Status: More alert. Conversive. Reasonable insight. She is oriented to person, place. Speech clearer, phonation improved.     Cranial Nerves: No cranial nerve deficit.     Sensory: No sensory deficit.      Normal language. Motor limited by pain RUE and bilateral LE's. LUE grossly 3-4/5. LE's 2+/5 prox to 4/5  distally.        Assessment/Plan: 1. Functional deficits which require 3+ hours per day of interdisciplinary therapy in a comprehensive inpatient rehab setting. Physiatrist is providing close team supervision and 24 hour management of active medical problems listed below. Physiatrist and rehab team continue to assess barriers to discharge/monitor patient progress toward functional and medical goals  Care Tool:  Bathing  Bathing activity did not occur: Refused Body parts bathed by patient: Chest, Abdomen, Face   Body parts bathed by helper: Right arm, Left arm, Front perineal area, Right upper leg, Buttocks, Left upper leg, Right lower leg, Left lower leg     Bathing assist Assist Level: Total Assistance - Patient < 25%     Upper Body Dressing/Undressing Upper body dressing   What is the patient wearing?: Button up shirt Orthosis activity level: Performed by helper  Upper body assist Assist Level: Total Assistance - Patient < 25%    Lower Body Dressing/Undressing Lower body dressing      What is the patient wearing?: Incontinence brief, Pants     Lower body assist Assist for  lower body dressing: Dependent - Patient 0%     Toileting Toileting    Toileting assist Assist for toileting: Dependent - Patient 0%     Transfers Chair/bed transfer  Transfers assist  Chair/bed transfer activity did not occur: Refused  Chair/bed transfer assist level: Moderate Assistance - Patient 50 - 74%     Locomotion Ambulation   Ambulation assist   Ambulation activity did not occur: Safety/medical concerns          Walk 10 feet activity   Assist  Walk 10 feet activity did not occur: Safety/medical concerns        Walk 50 feet activity   Assist Walk 50 feet with 2 turns activity did not occur: Safety/medical concerns         Walk 150 feet activity   Assist Walk 150 feet activity did not occur: Safety/medical concerns         Walk 10 feet on uneven  surface  activity   Assist Walk 10 feet on uneven surfaces activity did not occur: Safety/medical concerns         Wheelchair     Assist Is the patient using a wheelchair?: Yes Type of Wheelchair: Manual    Wheelchair assist level: Total Assistance - Patient < 25% (Pt uses LUE, but w/c too tall to attempt use of LEs.) Max wheelchair distance: 20'    Wheelchair 50 feet with 2 turns activity    Assist        Assist Level: Maximal Assistance - Patient 25 - 49%   Wheelchair 150 feet activity     Assist      Assist Level: Total Assistance - Patient < 25%   Blood pressure (!) 158/58, pulse 95, temperature 99.2 F (37.3 C), temperature source Oral, resp. rate 17, height 4\' 8"  (1.422 m), weight 51.3 kg, SpO2 96 %.  Medical Problem List and Plan: 1.  Functional deficits secondary to polytrauma including right proximal humerus fx, pelvic fx's             -patient may shower             -ELOS/Goals: 10-12 days, supervision to min assist goals  -Continue CIR therapies including PT and OT. Interdisciplinary team conference today to discuss goals, barriers to discharge, and dc planning.  2.  Antithrombotics: -DVT/anticoagulation:  Pharmaceutical: Lovenox--Eliquis has been on hold             -antiplatelet therapy: N/A 3. Chronic LBP/compression Fx/Pain Management:  Was on hydrocodone prn PTA. --Oxycodone prn-- .  -added scheduled robaxin as well--tolerating 11/15- Oxy held 11/14 due to soft BP- resume today -scheduled robaxin reduced to 250mg  tid -appears more alert today 4. Mood             -antipsychotic agents: N/A 5. Neuropsych: This patient is ?capable of making decisions on her own behalf. 6. Skin/Wound Care: Monitor wound for healing. Routine pressure relief measures.  7. Fluids/Electrolytes/Nutrition: Strict I/O.   8. Right humerus fracture: NWB RUE with sling at all times --may doff for ADL  11/15- RUE elevation and k taping for swelling 9. Pelvic Fx:  WBAT BLE 10. Acute on chronic renal failure with hyperkalemia:  11/13 BUN/Cr continue to improve -continue current rate of IVF -repeat labs in am -suspect contributing to AMS and now subsequent improvement with hydration  11/15 bun/cr continue to fall.  -will reduce HS IVF 11. Polymyalgia rheumatica: Followed by Algernon Huxley on medrol 2mg /day PTA.  --On Medrol 4 mg/day since  admission 12. PAF: Monitor HR TID--Monitor HR TID. Continue coreg bid.  13. Leucocytosis: Monitor for signs of infection. Likely steroid effect.   11/15 WBC up to 12.1-ua neg, ucx pending -IS/OOB 14. Rectal pain/Constipation: Miralax added to Senna               - also related to fractures themselves and associated skeletal muscle spasms.  11/12- has a few dilated loops of bowel, but no significant stool burden- LBM this AM  11/13 KUB today is pending. abdomen benign appearing today. Had 2 more bm's yesterday evening and this morning.---observe, review KUB when available  11/15 multiple bm's, kub better -continue on regular diet.  15. Hypothyroid  On supplement  16. Urinary retention: Will keep foley for a few more days until constipation addressed.   11/15- will d/w re: removal today 17. Nausea  See above  -prn compazine 18. ABLA:  11/15 stable at 8.7    LOS: 4 days A FACE TO FACE EVALUATION WAS PERFORMED  Meredith Staggers 10/10/2021, 9:53 AM

## 2021-10-10 NOTE — Progress Notes (Signed)
Occupational Therapy Session Note  Patient Details  Name: [name] MRN: [id number] Date of Birth: 01/27/1932  Today's Date: 10/10/2021 OT Individual Time: 8315-1761 OT Individual Time Calculation (min): 54 min    Short Term Goals: Week 1:  OT Short Term Goal 1 (Week 1): Patient will don UB clothing with Mod A seated EOB. OT Short Term Goal 2 (Week 1): Patient will complete sit to stand transfer with Max A and LRAD. OT Short Term Goal 3 (Week 1): Patient will complete 1/3 parts of toileting task with Mod A. OT Short Term Goal 4 (Week 1): Patient wil maintain static sitting at EOB with supervision A.  Skilled Therapeutic Interventions/Progress Updates:    Pt greeted semi-reclined in bed, continuing to report nausea, but agreeable to OT treatment session with encouragement. Pt reported she felt she was going to have diarrhea again. Pt with large incontinent BM, and more BM while changing pt at bed level. Pt very resistive to turning in bed due to pain. Total A for peri-care and brief change. Pt then agreeable to try sitting EOB with encouragement. Pt needed max A to get to sitting. Once EOB, pt needed total A to remove shirt and R UE sling. Ace wrap and krilex were coming off exposing posterior slab elbow splint, so OT re-wrapped R elbow/forearm to cover splint properly. Pt was able to wash her chest and abdomen in sitting, but needed assistance to wash all other body parts. Total A to then don clean button up shirt. R bunny arm sling donned again by therapist with total A. Pt stood at EOB with mod A and took side steps towards HOB mod A. Pt returned to supine with max A and left semi-reclined in bed with bed alarm on, call bell in reach, and needs met.   Therapy Documentation Precautions:  Precautions Precautions: Fall, Other (comment) Precaution Comments: Significant bruising to face/neck R>L, High fall risk Required Braces or Orthoses: Sling Restrictions Weight Bearing Restrictions:  Yes RUE Weight Bearing: Non weight bearing RLE Weight Bearing: Weight bearing as tolerated LLE Weight Bearing: Weight bearing as tolerated Other Position/Activity Restrictions: Sling at all times except for during ADLs; keep elbow close to body when not in sling. No PROM/AROM at R shoulder for 2-3 weeks Pain: Pain Assessment Pain Scale: 0-10 Pain Score: 7  Pain Type: Acute pain Pain Location: Generalized Pain Descriptors / Indicators: Aching;Discomfort Pain Frequency: Constant Pain Onset: On-going Pain Intervention(s): Repositioned    Therapy/Group: Individual Therapy  [name] 10/10/2021, 8:36 AM

## 2021-10-10 NOTE — Progress Notes (Signed)
Physical Therapy Session Note  Patient Details  Name: [name] MRN: [id number] Date of Birth: 02-24-1932  Today's Date: 10/10/2021 PT Individual Time: 1102-1159 PT Individual Time Calculation (min): 57 min   Short Term Goals: Week 1:  PT Short Term Goal 1 (Week 1): Pt will transfer sup to sit w/ min A. PT Short Term Goal 2 (Week 1): Pt will transfer sit to stand w/ min A PT Short Term Goal 3 (Week 1): Pt will amb w/ least restrictive device x 15' w/ min A.  Skilled Therapeutic Interventions/Progress Updates:     Pt received supine in bed and agrees to therapy. Reports pain "all over". Number not provided. PT provides repositioning, mobility, and rest breaks to manage pain. Supine to sit with modA and cues for sequencing and hand placement. PT provides totalA to scoot toward edge of bed so that pt's feet are on floor to provide increased stability in sitting. PT assists with donning gown while pt is seated at EOB. Stand step transfer to [facility] with modA and cues for body mechanics, anterior weight shifting, increased hip and trunk extension to center gravity over base of support, and positioning for safe stand to sit transfer. WC transport to gym for time management. Stand step transfer to Nustep with same cueing and presentation from pt. Pt requires maxA to scoot back in Nustep seat. Pt completes Nustep for strength and endurance training, using bilateral lower extremities and L upper extremity. Pt compltes x9:00 and x8:00 with seated rest break for total of 17:00, at workoad of 3 with average steps per minute ~30. Cued to perform motion through entire range for increased strengthening as well as AAROM of bilateral lower extremities. Stand step transfer back to [facility] with modA and increased cues for ensuring both legs in contact with front of WC prior to attempting to sit. MaxA for scoot back into WC. Left seated with alarm intact and all needs within reach.  2nd Session: Pt reports that she would  like to rest this afternoon due to fatigue. PT will follow up as appropriate.  Therapy Documentation Precautions:  Precautions Precautions: Fall, Other (comment) Precaution Comments: Significant bruising to face/neck R>L, High fall risk Required Braces or Orthoses: Sling Restrictions Weight Bearing Restrictions: Yes RUE Weight Bearing: Non weight bearing RLE Weight Bearing: Weight bearing as tolerated LLE Weight Bearing: Weight bearing as tolerated Other Position/Activity Restrictions: Sling at all times except for during ADLs; keep elbow close to body when not in sling. No PROM/AROM at R shoulder for 2-3 weeks   Therapy/Group: Individual Therapy  Breck Coons, PT, DPT 10/10/2021, 3:37 PM

## 2021-10-10 NOTE — Progress Notes (Addendum)
Occupational Therapy Session Note  Patient Details  Name: [name] MRN: [id number] Date of Birth: 1931/12/15  Today's Date: 10/10/2021 OT Individual Time: 1300-1400 OT Individual Time Calculation (min): 60 min    Short Term Goals: Week 1:  OT Short Term Goal 1 (Week 1): Patient will don UB clothing with Mod A seated EOB. OT Short Term Goal 2 (Week 1): Patient will complete sit to stand transfer with Max A and LRAD. OT Short Term Goal 3 (Week 1): Patient will complete 1/3 parts of toileting task with Mod A. OT Short Term Goal 4 (Week 1): Patient wil maintain static sitting at EOB with supervision A.  Skilled Therapeutic Interventions/Progress Updates:  Pt greeted  seated in w/c agreeable to OT intervention. Session focus on BADL reeducation, and therapeutic activities focused on functional transers and increasing activity tolerance. Pt completed seated grooming tasks at sink with supervision, education provided on hemi techniques of positioning toothbrush in holes of sink while pt used L hand to apply paste to brush, noted some depth perception issues as pt undershot the application of the toothpaste, however pt denies change in her vision. Pt additionally completed additional seated grooming tasks such as flossing and using mouth wash to rinse.  Pt transported to gym with total A from w/c where pt worked on sit<>stands from w/c with pt needing MIN HHA. Noted bleeding coming through pts  bandage on her RLE. Returned pt to room to have LPN assess, LPN enter to change dressing. Discussed ADL routine/DME recs at home during dressing change with pt reporting she will be going home with her daughter who has a tub shower. Pt began describing what seems to be a TTB however pt reports "its a board with a seat where you can slide over." Will confirm to make appropriate DME recs. Pt needed +2 assist to scoot back in w/c.    pt left seated in w/c with LPN present.                      Therapy  Documentation Precautions:  Precautions Precautions: Fall, Other (comment) Precaution Comments: Significant bruising to face/neck R>L, High fall risk Required Braces or Orthoses: Sling Restrictions Weight Bearing Restrictions: Yes RUE Weight Bearing: Non weight bearing RLE Weight Bearing: Weight bearing as tolerated LLE Weight Bearing: Weight bearing as tolerated Other Position/Activity Restrictions: Sling at all times except for during ADLs; keep elbow close to body when not in sling. No PROM/AROM at R shoulder for 2-3 weeks  Pain: pt reports unrated generalized pain, provided rest breaks and repositioning as needed.    Therapy/Group: Individual Therapy  Precious Haws 10/10/2021, 3:34 PM

## 2021-10-11 ENCOUNTER — Inpatient Hospital Stay (HOSPITAL_COMMUNITY): Payer: Medicare Other

## 2021-10-11 LAB — URINE CULTURE: Culture: NO GROWTH

## 2021-10-11 LAB — BASIC METABOLIC PANEL
Anion gap: 8 (ref 5–15)
BUN: 45 mg/dL — ABNORMAL HIGH (ref 8–23)
CO2: 20 mmol/L — ABNORMAL LOW (ref 22–32)
Calcium: 8.1 mg/dL — ABNORMAL LOW (ref 8.9–10.3)
Chloride: 107 mmol/L (ref 98–111)
Creatinine, Ser: 1.49 mg/dL — ABNORMAL HIGH (ref 0.44–1.00)
GFR, Estimated: 34 mL/min — ABNORMAL LOW (ref >60)
Glucose, Bld: 79 mg/dL (ref 70–99)
Potassium: 5.7 mmol/L — ABNORMAL HIGH (ref 3.5–5.1)
Sodium: 135 mmol/L (ref 135–145)

## 2021-10-11 MED ORDER — PANTOPRAZOLE SODIUM 40 MG IV SOLR
40.0000 mg | Freq: Once | INTRAVENOUS | Status: AC
  Administered 2021-10-11: 40 mg via INTRAVENOUS
  Filled 2021-10-11: qty 40

## 2021-10-11 MED ORDER — SODIUM ZIRCONIUM CYCLOSILICATE 5 G PO PACK
5.0000 g | PACK | Freq: Once | ORAL | Status: AC
  Administered 2021-10-11: 5 g via ORAL
  Filled 2021-10-11: qty 1

## 2021-10-11 MED ORDER — SODIUM CHLORIDE 0.45 % IV SOLN: INTRAVENOUS | Status: DC

## 2021-10-11 NOTE — Progress Notes (Signed)
Patient reported having chocking episode followed by difficulty swallowing. Has been unable to keep anything down--regurgitated applesauce as well as sips of water and coke. Reached out to Dr. Paulita Fujita [facility]) to evaluate patient.

## 2021-10-11 NOTE — Progress Notes (Signed)
Physical Therapy Session Note  Patient Details  Name: [name] MRN: [id number] Date of Birth: [birth year]  Today's Date: 10/11/2021 PT Missed Time: 40 Minutes Missed Time Reason: MD hold (Comment) (Pt with esophageal spasming)  Short Term Goals: Week 1:  PT Short Term Goal 1 (Week 1): Pt will transfer sup to sit w/ min A. PT Short Term Goal 2 (Week 1): Pt will transfer sit to stand w/ min A PT Short Term Goal 3 (Week 1): Pt will amb w/ least restrictive device x 15' w/ min A.  Skilled Therapeutic Interventions/Progress Updates:     PA Pam in room and asks to defer therapy at this time due to pt experiencing esophageal spasming after choking incident shortly before scheduled session. PT will follow up as appropriate.  Therapy Documentation Precautions:  Precautions Precautions: Fall, Other (comment) Precaution Comments: Significant bruising to face/neck R>L, High fall risk Required Braces or Orthoses: Sling Restrictions Weight Bearing Restrictions: Yes RUE Weight Bearing: Non weight bearing RLE Weight Bearing: Weight bearing as tolerated LLE Weight Bearing: Weight bearing as tolerated Other Position/Activity Restrictions: Sling at all times except for during ADLs; keep elbow close to body when not in sling. No PROM/AROM at R shoulder for 2-3 weeks General: PT Amount of Missed Time (min): 75 Minutes PT Missed Treatment Reason: MD hold (Comment) (Pt with esophageal spasming)   Therapy/Group: Individual Therapy  Breck Coons, PT, DPT 10/11/2021, 4:08 PM

## 2021-10-11 NOTE — Progress Notes (Signed)
Occupational Therapy Session Note  Patient Details  Name: [name] MRN: [id number] Date of Birth: 07-31-1932  Today's Date: 10/11/2021 OT Individual Time: 9476-5465 OT Individual Time Calculation (min): 57 min    Short Term Goals: Week 1:  OT Short Term Goal 1 (Week 1): Patient will don UB clothing with Mod A seated EOB. OT Short Term Goal 2 (Week 1): Patient will complete sit to stand transfer with Max A and LRAD. OT Short Term Goal 3 (Week 1): Patient will complete 1/3 parts of toileting task with Mod A. OT Short Term Goal 4 (Week 1): Patient wil maintain static sitting at EOB with supervision A.  Skilled Therapeutic Interventions/Progress Updates:  Pt greeted supine in bed agreeable to OT intervention. Session focus on BADL reeducation and functional ADL transfers. Pt completed bed mobility supine>sit with MODA needing step by step cues and assist to elevate trunk into sitting and scoot hips to EOB. Pt reports urgency to void bowels, MIN A to rise from EOB with HHA and MOD verbal cues as pt attempts to stand with kyphotic posture, MOD A to pviot to [facility] with HHA. Pt with 2x incontinent BM episodes, MIN A to rise from [facility] x2 while OTA provided dependent pericare, total A to don new brief and pants and pull up to waist line. Pt continues to endorse feeling nauseous anytime pt moves causing pt to have BM. Pt completed stand pivot back to bed with MODA, MOD A to return to supine needing assist to elevate BLEs back to bed.  pt left supine in bed with all needs within reach and bed alarm activated.                   Therapy Documentation Precautions:  Precautions Precautions: Fall, Other (comment) Precaution Comments: Significant bruising to face/neck R>L, High fall risk Required Braces or Orthoses: Sling Restrictions Weight Bearing Restrictions: Yes RUE Weight Bearing: Non weight bearing RLE Weight Bearing: Weight bearing as tolerated LLE Weight Bearing: Weight bearing as  tolerated Other Position/Activity Restrictions: Sling at all times except for during ADLs; keep elbow close to body when not in sling. No PROM/AROM at R shoulder for 2-3 weeks  Pain: pt reports unrated pain in RUE and RLE, provided rest breaks and repositioning as needed.     Therapy/Group: Individual Therapy  Corinne Ports [facility] 10/11/2021, 10:45 AM

## 2021-10-11 NOTE — Progress Notes (Signed)
Physical Therapy Session Note  Patient Details  Name: [name] MRN: [id number] Date of Birth: 1932-05-12  Today's Date: 10/11/2021 PT Individual Time: 0800-0900 PT Individual Time Calculation (min): 60 min   Short Term Goals: Week 1:  PT Short Term Goal 1 (Week 1): Pt will transfer sup to sit w/ min A. PT Short Term Goal 2 (Week 1): Pt will transfer sit to stand w/ min A PT Short Term Goal 3 (Week 1): Pt will amb w/ least restrictive device x 15' w/ min A.  Skilled Therapeutic Interventions/Progress Updates:     Patient in bed upon PT arrival. Patient alert and agreeable to PT session. Patient reported 7/10 pelvic and R lower extremity pain during session, RN made aware and provided pain medicine during session. PT provided repositioning, rest breaks, and distraction as pain interventions throughout session.   Therapeutic Activity: Donned pants with max A with patient lifting each leg to thread her legs through, total A to threat catheter through, and performed bridging x3 to pull pants up bed level. Donned B socks with total A and cut top elastic to reduce banding due to B ankle edema.  Bed Mobility: Patient performed supine to/from sit and scooting EOB with min A and increased time due to pain and NWB Thomas upper extremity, placed in sling throughout session. Provided verbal cues for bringing her legs off the bed before sitting up and performing reciprocal scooting to come to EOB. Transfers: Patient performed sit to/from stand x2 and stand pivot bed<>BSC with min A with Thomas HHA or use of Thomas bed rail and increased time for pain management. Provided verbal cues for foot placement and forward weight shift. Patient was incontinent of bowl in standing/pivoting to [facility]. Provided positioning while seated on BSC with trash can laid horizontally on the floor under her feet for pain management. She performed standing balance with min A-CGA during peri-care and lower body clothing management with total A  10-15 sec x2.  Therapeutic Exercise: Patient performed the following exercises with verbal and tactile cues for proper technique. -B heel slides x5 -B SLR x5 with AAROM -B hip abd/add x5 -B ankle pumps x10  Patient in bed at end of session with breaks locked, bed alarm set, and all needs within reach.   Therapy Documentation Precautions:  Precautions Precautions: Fall, Other (comment) Precaution Comments: Significant bruising to face/neck R>Thomas, High fall risk Required Braces or Orthoses: Sling Restrictions Weight Bearing Restrictions: Yes RUE Weight Bearing: Non weight bearing RLE Weight Bearing: Weight bearing as tolerated LLE Weight Bearing: Weight bearing as tolerated Other Position/Activity Restrictions: Sling at all times except for during ADLs; keep elbow close to body when not in sling. No PROM/AROM at R shoulder for 2-3 weeks    Therapy/Group: Individual Therapy  [name] Donnita Farina PT, DPT  10/11/2021, 10:00 AM

## 2021-10-11 NOTE — Plan of Care (Signed)
I came to see patient over concern for food impaction.  When I came to see patient, she was able to drink liquids without regurgitation or sialorrhea.  She feels the meatball went down.  She has no chest discomfort.  I don't think she has food impaction at this time.  I advised medical team initiate trial of full liquids today, and if tolerated, fully advance tomorrow.  If there remain persistent concerns, please call us back and we can consider endoscopy tomorrow.  Otherwise, [facility] will sign-off; please call with questions; thank you.

## 2021-10-11 NOTE — Progress Notes (Signed)
Inpatient Rehabilitation Care Coordinator Assessment and Plan Patient Details  Name: [name] MRN: [id number] Date of Birth: Jun 07, 1932  Today's Date: 10/11/2021  Hospital Problems: Principal Problem:   Pelvic fracture [facility])  Past Medical History:  Past Medical History:  Diagnosis Date   Allergic rhinitis    Anemia    iron deficient   Aneurysm ([place])    Anxiety    Basal cell carcinoma    Chronic anemia    Chronic low back pain    Colon polyps    GERD (gastroesophageal reflux disease)    Hiatal hernia    HTN (hypertension)    Hypercholesteremia    Hyperlipidemia    Hypothyroidism    Insomnia    Left heart failure ([place])    Osteoporosis    S/P forteo treatment as of 02/2009   Pacemaker    Polymyalgia ([place])    rheumatica-steroids per Rheum   Systolic heart failure    Past Surgical History:  Past Surgical History:  Procedure Laterality Date   ABDOMINAL HYSTERECTOMY     ANGIOPLASTY     BACK SURGERY     CARDIAC CATHETERIZATION  10/16/06   CATARACT EXTRACTION     defibulator implant     EP IMPLANTABLE DEVICE N/A 04/10/2016   Procedure: BiV Pacemaker Insertion CRT-P;  Surgeon: Evans Lance, MD;  Location: [facility];  Service: Cardiovascular;  Laterality: N/A;   FOOT SURGERY     MITRAL VALVE REPAIR     skin cancer removal     THYROIDECTOMY     TONSILLECTOMY     Social History:  reports that she has never smoked. She has never used smokeless tobacco. She reports that she does not drink alcohol and does not use drugs.  Family / Support Systems Marital Status: Widow/Widower How Long?: 40 yrs Patient Roles: Parent Spouse/Significant Other: Widowed Children: 3 children- Dee, Hector, and Terri Other Supports: Dee (dtr) Anticipated Caregiver: [name] (dtr) Ability/Limitations of Caregiver: Pt dtr [name] will be primary caregiver. [name] is currenlty using a walker due to knee issues.  [name] will help when able as she works during the day. Caregiver  Availability: 24/7 Family Dynamics: Pt was living alone PTA.  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 Education: [place] - How often do you need to have someone help you when you read instructions, pamphlets, or other written material from your doctor or pharmacy?: Never Writes: Yes Employment Status: Retired Date Retired/Disabled/Unemployed: 2000 Age Retired: 60 Public relations account executive Issues: Denies Guardian/Conservator: N/A   Abuse/Neglect Abuse/Neglect Assessment Can Be Completed: Yes Physical Abuse: Denies Verbal Abuse: Denies Sexual Abuse: Denies Exploitation of patient/patient's resources: Denies Self-Neglect: Denies  Patient response to: Social Isolation - How often do you feel lonely or isolated from those around you?: Never  Emotional Status Pt's affect, behavior and adjustment status: Pt in good spirits at time of visit Recent Psychosocial Issues: denies Psychiatric History: Denies Substance Abuse History: Denies  Patient / Family Perceptions, Expectations & Goals Pt/Family understanding of illness & functional limitations: Pt and family have a general understanding for pt care needs Premorbid pt/family roles/activities: Independent Anticipated changes in roles/activities/participation: Assistance with ADLs/IADLs Pt/family expectations/goals: Pt goal is " get strength, balance, and get back to living like before."  [employer]: None Premorbid Home Care/DME Agencies: None Transportation available at discharge: Dtrs Is the patient  able to respond to transportation needs?: Yes In the past 12 months, has lack of transportation kept you from medical appointments or from getting medications?: No In the past 12 months, has lack of transportation kept you from meetings, work, or from  getting things needed for daily living?: No Resource referrals recommended: Neuropsychology  Discharge Planning Living Arrangements: Children Support Systems: Children Type of Residence: Private residence Insurance Resources: [employer], Multimedia programmer (specify) (Field seismologist) Financial Resources: Radio broadcast assistant Screen Referred: No Living Expenses: Own Money Management: Patient Does the patient have any problems obtaining your medications?: No Home Management: Pt managed all homecare needs. Pt drove short distances when at home such as to grocery store and pharmacy. Patient/Family Preliminary Plans: TBD Care Coordinator Barriers to Discharge: Decreased caregiver support, Lack of/limited family support Care Coordinator Anticipated Follow Up Needs: HH/OP  Clinical Impression Pt is not a veteran. HCPOA- dtr [name]. DME: Cornelious Bryant 10/11/2021, 3:42 PM

## 2021-10-11 NOTE — Progress Notes (Signed)
PROGRESS NOTE   Subjective/Complaints: Pt says she's feeling well this morning! Was able to sleep. Moving bowels now  ROS: Patient denies fever, rash, sore throat, blurred vision,   vomiting, diarrhea, cough, shortness of breath or chest pain,   headache, or mood change.   Objective:   No results found. Recent Labs    10/09/21 0510 10/10/21 0506  WBC 11.7* 12.1*  HGB 8.5* 8.7*  HCT 26.3* 26.3*  PLT 294 295   Recent Labs    10/10/21 0506 10/11/21 0504  NA 135 135  K 5.3* 5.7*  CL 107 107  CO2 19* 20*  GLUCOSE 86 79  BUN 38* 45*  CREATININE 1.22* 1.49*  CALCIUM 8.1* 8.1*    Intake/Output Summary (Last 24 hours) at 10/11/2021 0832 Last data filed at 10/11/2021 0542 Gross per 24 hour  Intake 497 ml  Output 950 ml  Net -453 ml        Physical Exam: Vital Signs Blood pressure (!) 169/73, pulse 92, temperature 98.7 F (37.1 C), temperature source Oral, resp. rate 18, height 4\' 8"  (1.422 m), weight 50.3 kg, SpO2 100 %.  Constitutional: No distress . Vital signs reviewed. HEENT: NCAT, EOMI, oral membranes moist Neck: supple Cardiovascular: RRR without murmur. No JVD    Respiratory/Chest: CTA Bilaterally without wheezes or rales. Normal effort    GI/Abdomen: BS +, non-tender, non-distended Ext: no clubbing, cyanosis, or edema Psych: pleasant and cooperative, very engagin Musculoskeletal:        General: Swelling and tenderness (RUE and pelvis) present.  GU: Foley remains in place Skin:    Comments: Bruising RUE, bilateral LE's. Both legs with dressings in place midway.  R hand less swollen- in sling- k tape.   Neurological:     Mental Status: Alert, remembered my name. Watching basketball, discussed details about teams/coaches.  She is oriented to person, place. Speech clearer, phonation improved.     Cranial Nerves: No cranial nerve deficit.     Sensory: No sensory deficit.      Normal language. Motor  limited by pain RUE and bilateral LE's. LUE grossly 3-4/5. LE's 2+/5 prox to 4/5 distally--stable appearance.        Assessment/Plan: 1. Functional deficits which require 3+ hours per day of interdisciplinary therapy in a comprehensive inpatient rehab setting. Physiatrist is providing close team supervision and 24 hour management of active medical problems listed below. Physiatrist and rehab team continue to assess barriers to discharge/monitor patient progress toward functional and medical goals  Care Tool:  Bathing  Bathing activity did not occur: Refused Body parts bathed by patient: Chest, Abdomen, Face   Body parts bathed by helper: Right arm, Left arm, Front perineal area, Right upper leg, Buttocks, Left upper leg, Right lower leg, Left lower leg     Bathing assist Assist Level: Total Assistance - Patient < 25%     Upper Body Dressing/Undressing Upper body dressing   What is the patient wearing?: Button up shirt Orthosis activity level: Performed by helper  Upper body assist Assist Level: Total Assistance - Patient < 25%    Lower Body Dressing/Undressing Lower body dressing      What is the patient  wearing?: Incontinence brief, Pants     Lower body assist Assist for lower body dressing: Dependent - Patient 0%     Toileting Toileting    Toileting assist Assist for toileting: Dependent - Patient 0%     Transfers Chair/bed transfer  Transfers assist  Chair/bed transfer activity did not occur: Refused  Chair/bed transfer assist level: Moderate Assistance - Patient 50 - 74%     Locomotion Ambulation   Ambulation assist   Ambulation activity did not occur: Safety/medical concerns          Walk 10 feet activity   Assist  Walk 10 feet activity did not occur: Safety/medical concerns        Walk 50 feet activity   Assist Walk 50 feet with 2 turns activity did not occur: Safety/medical concerns         Walk 150 feet activity   Assist  Walk 150 feet activity did not occur: Safety/medical concerns         Walk 10 feet on uneven surface  activity   Assist Walk 10 feet on uneven surfaces activity did not occur: Safety/medical concerns         Wheelchair     Assist Is the patient using a wheelchair?: Yes Type of Wheelchair: Manual    Wheelchair assist level: Total Assistance - Patient < 25% (Pt uses LUE, but w/c too tall to attempt use of LEs.) Max wheelchair distance: 20'    Wheelchair 50 feet with 2 turns activity    Assist        Assist Level: Maximal Assistance - Patient 25 - 49%   Wheelchair 150 feet activity     Assist      Assist Level: Total Assistance - Patient < 25%   Blood pressure (!) 169/73, pulse 92, temperature 98.7 F (37.1 C), temperature source Oral, resp. rate 18, height 4\' 8"  (1.422 m), weight 50.3 kg, SpO2 100 %.  Medical Problem List and Plan: 1.  Functional deficits secondary to polytrauma including right proximal humerus fx, pelvic fx's             -patient may shower             -ELOS/Goals: 10-12 days, supervision to min assist goals  --Continue CIR therapies including PT, OT. Reached out to daughter yesterday but did not connect 2.  Antithrombotics: -DVT/anticoagulation:  Pharmaceutical: Lovenox--Eliquis has been on hold             -antiplatelet therapy: N/A 3. Chronic LBP/compression Fx/Pain Management:  Was on hydrocodone prn PTA. --Oxycodone prn-- .  -added scheduled robaxin as well--tolerating 11/15- Oxy held 11/14 due to soft BP- resume today -scheduled robaxin reduced to 250mg  tid -appears more alert today 4. Mood             -antipsychotic agents: N/A 5. Neuropsych: This patient is ?capable of making decisions on her own behalf. 6. Skin/Wound Care: Monitor wound for healing. Routine pressure relief measures.  7. Fluids/Electrolytes/Nutrition: Strict I/O.   8. Right humerus fracture: NWB RUE with sling at all times --may doff for ADL  11/15- RUE  elevation and k taping for swelling 9. Pelvic Fx: WBAT BLE 10. Acute on chronic renal failure with hyperkalemia:  11/13 BUN/Cr continue to improve -continue current rate of IVF -repeat labs in am -suspect contributing to AMS and now subsequent improvement with hydration  11/16 BUN/Cr back up. She only received one day of IVF acturally  -begin continuous 1/2ns at 50cc/hr  -  lokelma for potassium  -not on any offending meds  -recheck bmet tomorrow 11. Polymyalgia rheumatica: Followed by Alger[facility] Hux[facility] on medrol 2mg /day PTA.  --On Medrol 4 mg/day since admission 12. PAF: Monitor HR TID--Monitor HR TID. Continue coreg bid.  13. Leukocytosis: Monitor for signs of infection. Likely steroid effect.   11/15 WBC up to 12.1-ua neg, ucx still pending -IS/OOB 14. Rectal pain/Constipation:   11/15-16 multiple bm's, kub better -continue on regular diet.  15. Hypothyroid  On supplement  16. Urinary retention: Will keep foley for a few more days until constipation addressed.   11/16 would like to remove foley this week. She seems to be perking up a bit. Hopefully that translates into easier transfers/mobility.  17. Nausea  See above  -prn compazine 18. ABLA:  11/15 stable at 8.7    LOS: 5 days A FACE TO FACE EVALUATION WAS PERFORMED  Meredith Staggers 10/11/2021, 8:32 AM

## 2021-10-12 ENCOUNTER — Inpatient Hospital Stay (HOSPITAL_COMMUNITY): Payer: Medicare Other

## 2021-10-12 ENCOUNTER — Encounter (HOSPITAL_COMMUNITY): Payer: Self-pay | Admitting: Family Medicine

## 2021-10-12 ENCOUNTER — Inpatient Hospital Stay (HOSPITAL_COMMUNITY)
Admission: EM | Admit: 2021-10-12 | Discharge: 2021-10-18 | DRG: 378 | Disposition: A | Discharge status: Rehabilitation Facility | Payer: Medicare Other | Source: Ambulatory Visit | Attending: Family Medicine | Admitting: Family Medicine

## 2021-10-12 DIAGNOSIS — G8929 Other chronic pain: Secondary | ICD-10-CM | POA: Diagnosis present

## 2021-10-12 DIAGNOSIS — Z90710 Acquired absence of both cervix and uterus: Secondary | ICD-10-CM

## 2021-10-12 DIAGNOSIS — Z95810 Presence of automatic (implantable) cardiac defibrillator: Secondary | ICD-10-CM

## 2021-10-12 DIAGNOSIS — D72829 Elevated white blood cell count, unspecified: Secondary | ICD-10-CM | POA: Diagnosis not present

## 2021-10-12 DIAGNOSIS — S32810S Multiple fractures of pelvis with stable disruption of pelvic ring, sequela: Secondary | ICD-10-CM | POA: Diagnosis not present

## 2021-10-12 DIAGNOSIS — I10 Essential (primary) hypertension: Secondary | ICD-10-CM | POA: Diagnosis not present

## 2021-10-12 DIAGNOSIS — S42201D Unspecified fracture of upper end of right humerus, subsequent encounter for fracture with routine healing: Secondary | ICD-10-CM | POA: Diagnosis not present

## 2021-10-12 DIAGNOSIS — Z86010 Personal history of colonic polyps: Secondary | ICD-10-CM

## 2021-10-12 DIAGNOSIS — I480 Paroxysmal atrial fibrillation: Secondary | ICD-10-CM | POA: Diagnosis not present

## 2021-10-12 DIAGNOSIS — K4090 Unilateral inguinal hernia, without obstruction or gangrene, not specified as recurrent: Secondary | ICD-10-CM | POA: Diagnosis present

## 2021-10-12 DIAGNOSIS — Z7952 Long term (current) use of systemic steroids: Secondary | ICD-10-CM

## 2021-10-12 DIAGNOSIS — Z880 Allergy status to penicillin: Secondary | ICD-10-CM

## 2021-10-12 DIAGNOSIS — Z9181 History of falling: Secondary | ICD-10-CM

## 2021-10-12 DIAGNOSIS — R110 Nausea: Secondary | ICD-10-CM | POA: Diagnosis not present

## 2021-10-12 DIAGNOSIS — K922 Gastrointestinal hemorrhage, unspecified: Secondary | ICD-10-CM | POA: Diagnosis not present

## 2021-10-12 DIAGNOSIS — I495 Sick sinus syndrome: Secondary | ICD-10-CM | POA: Diagnosis present

## 2021-10-12 DIAGNOSIS — M810 Age-related osteoporosis without current pathological fracture: Secondary | ICD-10-CM | POA: Diagnosis present

## 2021-10-12 DIAGNOSIS — N179 Acute kidney failure, unspecified: Secondary | ICD-10-CM | POA: Diagnosis not present

## 2021-10-12 DIAGNOSIS — F419 Anxiety disorder, unspecified: Secondary | ICD-10-CM | POA: Diagnosis present

## 2021-10-12 DIAGNOSIS — S329XXA Fracture of unspecified parts of lumbosacral spine and pelvis, initial encounter for closed fracture: Secondary | ICD-10-CM | POA: Diagnosis present

## 2021-10-12 DIAGNOSIS — R1310 Dysphagia, unspecified: Secondary | ICD-10-CM

## 2021-10-12 DIAGNOSIS — Z85828 Personal history of other malignant neoplasm of skin: Secondary | ICD-10-CM | POA: Diagnosis not present

## 2021-10-12 DIAGNOSIS — Z7982 Long term (current) use of aspirin: Secondary | ICD-10-CM

## 2021-10-12 DIAGNOSIS — Z887 Allergy status to serum and vaccine status: Secondary | ICD-10-CM

## 2021-10-12 DIAGNOSIS — E890 Postprocedural hypothyroidism: Secondary | ICD-10-CM | POA: Diagnosis present

## 2021-10-12 DIAGNOSIS — Z7901 Long term (current) use of anticoagulants: Secondary | ICD-10-CM

## 2021-10-12 DIAGNOSIS — M353 Polymyalgia rheumatica: Secondary | ICD-10-CM | POA: Diagnosis present

## 2021-10-12 DIAGNOSIS — E875 Hyperkalemia: Secondary | ICD-10-CM | POA: Diagnosis present

## 2021-10-12 DIAGNOSIS — I5042 Chronic combined systolic (congestive) and diastolic (congestive) heart failure: Secondary | ICD-10-CM | POA: Diagnosis present

## 2021-10-12 DIAGNOSIS — K219 Gastro-esophageal reflux disease without esophagitis: Secondary | ICD-10-CM | POA: Diagnosis present

## 2021-10-12 DIAGNOSIS — R140 Abdominal distension (gaseous): Secondary | ICD-10-CM | POA: Diagnosis not present

## 2021-10-12 DIAGNOSIS — K921 Melena: Secondary | ICD-10-CM | POA: Diagnosis present

## 2021-10-12 DIAGNOSIS — E871 Hypo-osmolality and hyponatremia: Secondary | ICD-10-CM | POA: Diagnosis not present

## 2021-10-12 DIAGNOSIS — R339 Retention of urine, unspecified: Secondary | ICD-10-CM | POA: Diagnosis present

## 2021-10-12 DIAGNOSIS — R109 Unspecified abdominal pain: Secondary | ICD-10-CM

## 2021-10-12 DIAGNOSIS — Z79890 Hormone replacement therapy (postmenopausal): Secondary | ICD-10-CM | POA: Diagnosis not present

## 2021-10-12 DIAGNOSIS — D649 Anemia, unspecified: Secondary | ICD-10-CM | POA: Diagnosis not present

## 2021-10-12 DIAGNOSIS — I428 Other cardiomyopathies: Secondary | ICD-10-CM | POA: Diagnosis present

## 2021-10-12 DIAGNOSIS — D509 Iron deficiency anemia, unspecified: Secondary | ICD-10-CM | POA: Diagnosis present

## 2021-10-12 DIAGNOSIS — Z20822 Contact with and (suspected) exposure to covid-19: Secondary | ICD-10-CM | POA: Diagnosis not present

## 2021-10-12 DIAGNOSIS — K5900 Constipation, unspecified: Secondary | ICD-10-CM | POA: Diagnosis not present

## 2021-10-12 DIAGNOSIS — N183 Chronic kidney disease, stage 3 unspecified: Secondary | ICD-10-CM | POA: Diagnosis not present

## 2021-10-12 DIAGNOSIS — N1832 Chronic kidney disease, stage 3b: Secondary | ICD-10-CM | POA: Diagnosis present

## 2021-10-12 DIAGNOSIS — S32511D Fracture of superior rim of right pubis, subsequent encounter for fracture with routine healing: Secondary | ICD-10-CM | POA: Diagnosis not present

## 2021-10-12 DIAGNOSIS — R5381 Other malaise: Secondary | ICD-10-CM | POA: Diagnosis not present

## 2021-10-12 DIAGNOSIS — E039 Hypothyroidism, unspecified: Secondary | ICD-10-CM | POA: Diagnosis not present

## 2021-10-12 DIAGNOSIS — I130 Hypertensive heart and chronic kidney disease with heart failure and stage 1 through stage 4 chronic kidney disease, or unspecified chronic kidney disease: Secondary | ICD-10-CM | POA: Diagnosis present

## 2021-10-12 DIAGNOSIS — E782 Mixed hyperlipidemia: Secondary | ICD-10-CM | POA: Diagnosis present

## 2021-10-12 DIAGNOSIS — Z882 Allergy status to sulfonamides status: Secondary | ICD-10-CM

## 2021-10-12 DIAGNOSIS — Z952 Presence of prosthetic heart valve: Secondary | ICD-10-CM

## 2021-10-12 DIAGNOSIS — Z79899 Other long term (current) drug therapy: Secondary | ICD-10-CM | POA: Diagnosis not present

## 2021-10-12 DIAGNOSIS — S32591D Other specified fracture of right pubis, subsequent encounter for fracture with routine healing: Secondary | ICD-10-CM | POA: Diagnosis not present

## 2021-10-12 DIAGNOSIS — K760 Fatty (change of) liver, not elsewhere classified: Secondary | ICD-10-CM | POA: Diagnosis present

## 2021-10-12 DIAGNOSIS — D62 Acute posthemorrhagic anemia: Secondary | ICD-10-CM | POA: Diagnosis present

## 2021-10-12 DIAGNOSIS — W19XXXD Unspecified fall, subsequent encounter: Secondary | ICD-10-CM | POA: Diagnosis present

## 2021-10-12 DIAGNOSIS — I5022 Chronic systolic (congestive) heart failure: Secondary | ICD-10-CM | POA: Diagnosis not present

## 2021-10-12 DIAGNOSIS — K625 Hemorrhage of anus and rectum: Secondary | ICD-10-CM | POA: Diagnosis not present

## 2021-10-12 DIAGNOSIS — S32301A Unspecified fracture of right ilium, initial encounter for closed fracture: Secondary | ICD-10-CM | POA: Diagnosis not present

## 2021-10-12 LAB — CBC WITH DIFFERENTIAL/PLATELET
Abs Immature Granulocytes: 0.6 10*3/uL — ABNORMAL HIGH (ref 0.00–0.07)
Basophils Absolute: 0.1 10*3/uL (ref 0.0–0.1)
Basophils Relative: 1 %
Eosinophils Absolute: 0.2 10*3/uL (ref 0.0–0.5)
Eosinophils Relative: 1 %
HCT: 24.9 % — ABNORMAL LOW (ref 36.0–46.0)
Hemoglobin: 7.9 g/dL — ABNORMAL LOW (ref 12.0–15.0)
Immature Granulocytes: 5 %
Lymphocytes Relative: 16 %
Lymphs Abs: 1.9 10*3/uL (ref 0.7–4.0)
MCH: 31.6 pg (ref 26.0–34.0)
MCHC: 31.7 g/dL (ref 30.0–36.0)
MCV: 99.6 fL (ref 80.0–100.0)
Monocytes Absolute: 1.1 10*3/uL — ABNORMAL HIGH (ref 0.1–1.0)
Monocytes Relative: 9 %
Neutro Abs: 8.2 10*3/uL — ABNORMAL HIGH (ref 1.7–7.7)
Neutrophils Relative %: 68 %
Platelets: 352 10*3/uL (ref 150–400)
RBC: 2.5 MIL/uL — ABNORMAL LOW (ref 3.87–5.11)
RDW: 15.3 % (ref 11.5–15.5)
WBC: 12 10*3/uL — ABNORMAL HIGH (ref 4.0–10.5)
nRBC: 0 % (ref 0.0–0.2)

## 2021-10-12 LAB — BASIC METABOLIC PANEL
Anion gap: 9 (ref 5–15)
Anion gap: 7 (ref 5–15)
BUN: 42 mg/dL — ABNORMAL HIGH (ref 8–23)
BUN: 41 mg/dL — ABNORMAL HIGH (ref 8–23)
CO2: 18 mmol/L — ABNORMAL LOW (ref 22–32)
CO2: 18 mmol/L — ABNORMAL LOW (ref 22–32)
Calcium: 7.8 mg/dL — ABNORMAL LOW (ref 8.9–10.3)
Calcium: 7.4 mg/dL — ABNORMAL LOW (ref 8.9–10.3)
Chloride: 106 mmol/L (ref 98–111)
Chloride: 109 mmol/L (ref 98–111)
Creatinine, Ser: 1.29 mg/dL — ABNORMAL HIGH (ref 0.44–1.00)
Creatinine, Ser: 1.2 mg/dL — ABNORMAL HIGH (ref 0.44–1.00)
GFR, Estimated: 40 mL/min — ABNORMAL LOW (ref >60)
GFR, Estimated: 44 mL/min — ABNORMAL LOW (ref >60)
Glucose, Bld: 94 mg/dL (ref 70–99)
Glucose, Bld: 196 mg/dL — ABNORMAL HIGH (ref 70–99)
Potassium: 5.4 mmol/L — ABNORMAL HIGH (ref 3.5–5.1)
Potassium: 5 mmol/L (ref 3.5–5.1)
Sodium: 133 mmol/L — ABNORMAL LOW (ref 135–145)
Sodium: 134 mmol/L — ABNORMAL LOW (ref 135–145)

## 2021-10-12 LAB — TYPE AND SCREEN
ABO/RH(D): O POS
Antibody Screen: NEGATIVE

## 2021-10-12 LAB — PREPARE RBC (CROSSMATCH)

## 2021-10-12 LAB — HEMATOCRIT
HCT: 20.1 % — ABNORMAL LOW (ref 36.0–46.0)
HCT: 31.4 % — ABNORMAL LOW (ref 36.0–46.0)
HCT: 28.1 % — ABNORMAL LOW (ref 36.0–46.0)

## 2021-10-12 LAB — COMPREHENSIVE METABOLIC PANEL
ALT: 66 U/L — ABNORMAL HIGH (ref 0–44)
AST: 55 U/L — ABNORMAL HIGH (ref 15–41)
Albumin: 1.9 g/dL — ABNORMAL LOW (ref 3.5–5.0)
Alkaline Phosphatase: 89 U/L (ref 38–126)
Anion gap: 7 (ref 5–15)
BUN: 41 mg/dL — ABNORMAL HIGH (ref 8–23)
CO2: 20 mmol/L — ABNORMAL LOW (ref 22–32)
Calcium: 7.9 mg/dL — ABNORMAL LOW (ref 8.9–10.3)
Chloride: 107 mmol/L (ref 98–111)
Creatinine, Ser: 1.37 mg/dL — ABNORMAL HIGH (ref 0.44–1.00)
GFR, Estimated: 37 mL/min — ABNORMAL LOW (ref >60)
Glucose, Bld: 93 mg/dL (ref 70–99)
Potassium: 5.7 mmol/L — ABNORMAL HIGH (ref 3.5–5.1)
Sodium: 134 mmol/L — ABNORMAL LOW (ref 135–145)
Total Bilirubin: 0.9 mg/dL (ref 0.3–1.2)
Total Protein: 4 g/dL — ABNORMAL LOW (ref 6.5–8.1)

## 2021-10-12 LAB — PROTIME-INR
INR: 1 (ref 0.8–1.2)
Prothrombin Time: 13.6 seconds (ref 11.4–15.2)

## 2021-10-12 LAB — APTT: aPTT: 38 seconds — ABNORMAL HIGH (ref 24–36)

## 2021-10-12 LAB — HEMOGLOBIN
Hemoglobin: 6.2 g/dL — CL (ref 12.0–15.0)
Hemoglobin: 10.4 g/dL — ABNORMAL LOW (ref 12.0–15.0)
Hemoglobin: 9.2 g/dL — ABNORMAL LOW (ref 12.0–15.0)

## 2021-10-12 LAB — TSH: TSH: 10.145 u[IU]/mL — ABNORMAL HIGH (ref 0.350–4.500)

## 2021-10-12 MED ORDER — METHYLPREDNISOLONE 4 MG PO TABS
4.0000 mg | ORAL_TABLET | Freq: Every day | ORAL | Status: DC
  Administered 2021-10-12 – 2021-10-18 (×7): 4 mg via ORAL
  Filled 2021-10-12 (×7): qty 1

## 2021-10-12 MED ORDER — OXYCODONE HCL 5 MG PO TABS
5.0000 mg | ORAL_TABLET | Freq: Four times a day (QID) | ORAL | Status: DC | PRN
  Administered 2021-10-12 – 2021-10-17 (×11): 5 mg via ORAL
  Filled 2021-10-12 (×14): qty 1

## 2021-10-12 MED ORDER — LEVOTHYROXINE SODIUM 75 MCG PO TABS
75.0000 ug | ORAL_TABLET | Freq: Every day | ORAL | Status: DC
  Administered 2021-10-12 – 2021-10-18 (×7): 75 ug via ORAL
  Filled 2021-10-12 (×7): qty 1

## 2021-10-12 MED ORDER — SODIUM CHLORIDE 0.9 % IV SOLN: INTRAVENOUS | Status: DC

## 2021-10-12 MED ORDER — ATORVASTATIN CALCIUM 40 MG PO TABS
40.0000 mg | ORAL_TABLET | Freq: Every day | ORAL | Status: DC
  Administered 2021-10-12 – 2021-10-18 (×7): 40 mg via ORAL
  Filled 2021-10-12 (×7): qty 1

## 2021-10-12 MED ORDER — PANTOPRAZOLE SODIUM 40 MG PO TBEC
40.0000 mg | DELAYED_RELEASE_TABLET | Freq: Every day | ORAL | Status: DC
  Administered 2021-10-12 – 2021-10-18 (×7): 40 mg via ORAL
  Filled 2021-10-12 (×7): qty 1

## 2021-10-12 MED ORDER — ALBUTEROL SULFATE (2.5 MG/3ML) 0.083% IN NEBU: 2.5000 mg | INHALATION_SOLUTION | Freq: Four times a day (QID) | RESPIRATORY_TRACT | Status: DC | PRN

## 2021-10-12 MED ORDER — SODIUM CHLORIDE 0.9 % IV BOLUS: 1000.0000 mL | Freq: Once | INTRAVENOUS | Status: DC

## 2021-10-12 MED ORDER — SODIUM CHLORIDE 0.9 % IV BOLUS
1000.0000 mL | Freq: Once | INTRAVENOUS | Status: AC
  Administered 2021-10-12: 05:00:00 1000 mL via INTRAVENOUS

## 2021-10-12 MED ORDER — ACETAMINOPHEN 650 MG RE SUPP: 650.0000 mg | Freq: Four times a day (QID) | RECTAL | Status: DC | PRN

## 2021-10-12 MED ORDER — SODIUM CHLORIDE 0.9% IV SOLUTION: Freq: Once | INTRAVENOUS | Status: AC

## 2021-10-12 MED ORDER — FUROSEMIDE 10 MG/ML IJ SOLN
20.0000 mg | Freq: Once | INTRAMUSCULAR | Status: AC
Start: 2021-10-12 — End: 2021-10-12
  Administered 2021-10-12: 11:00:00 20 mg via INTRAVENOUS
  Filled 2021-10-12: qty 2

## 2021-10-12 MED ORDER — ACETAMINOPHEN 325 MG PO TABS
650.0000 mg | ORAL_TABLET | Freq: Four times a day (QID) | ORAL | Status: DC | PRN
  Administered 2021-10-12 – 2021-10-13 (×2): 650 mg via ORAL
  Filled 2021-10-12 (×2): qty 2

## 2021-10-12 MED ORDER — IOHEXOL 300 MG/ML  SOLN
25.0000 mL | Freq: Two times a day (BID) | INTRAMUSCULAR | Status: AC
  Administered 2021-10-12: 22:00:00 25 mL via ORAL

## 2021-10-12 MED ORDER — ONDANSETRON HCL 4 MG PO TABS
4.0000 mg | ORAL_TABLET | Freq: Four times a day (QID) | ORAL | Status: DC | PRN
  Administered 2021-10-13 – 2021-10-16 (×2): 4 mg via ORAL
  Filled 2021-10-12: qty 1

## 2021-10-12 MED ORDER — ONDANSETRON HCL 4 MG/2ML IJ SOLN: 4.0000 mg | Freq: Four times a day (QID) | INTRAMUSCULAR | Status: DC | PRN

## 2021-10-12 MED ORDER — MELATONIN 5 MG PO TABS
5.0000 mg | ORAL_TABLET | Freq: Every evening | ORAL | Status: DC | PRN
  Administered 2021-10-17: 5 mg via ORAL
  Filled 2021-10-12 (×2): qty 1

## 2021-10-12 MED ORDER — SODIUM CHLORIDE 0.9 % IV BOLUS: 1000.0000 mL | Freq: Once | INTRAVENOUS | Status: AC

## 2021-10-12 MED ORDER — HYDRALAZINE HCL 25 MG PO TABS
25.0000 mg | ORAL_TABLET | Freq: Four times a day (QID) | ORAL | Status: DC | PRN
  Administered 2021-10-15: 25 mg via ORAL
  Filled 2021-10-12: qty 1

## 2021-10-12 NOTE — Progress Notes (Signed)
Received phone call from MD. Opyd. Bolus x1 (1000 ml) was ordered, blood transfusion was ordered. Type and screen was placed earlier during Epic downtime, but no results are available. Received phone call from blood bank to put new order for type and screen.Order for Type and Screen placed.

## 2021-10-12 NOTE — Consult Note (Signed)
Referring Provider: [facility] Primary Care Physician:  Lawerance Cruel, MD Primary Gastroenterologist:  Dr. Watt Climes  Reason for Consultation:  painless hematochezia  HPI: [name] is a 87 y.o. female history of HTN, chronic LBP, anemia, recent diagnosis of PAF- on eliquis, PPM, CKD, PMR-chronic steroids presents for painless hematochezia.  Originally admitted 10/01/21 for fall and found to have transverse proximal humeral head fracture and right pubic rami and superior rami fractures.  Patient states last night she started having rectal bleeding. States she has been having diarrhea and felt the need to go to the bathroom and noticed she had bright red blood clots on tissue paper and in toilet. States she has not seen this in the past. It was a small-moderate amount.  Denies abdominal pain, nausea, vomiting. Denies melena.   Last colonoscopy was in 2007, small tubular adenoma in descending colon, otherwise normal. Family history of colon cancer in her sister who died from it.   Past Medical History:  Diagnosis Date   Allergic rhinitis    Anemia    iron deficient   Aneurysm (HCC)    Anxiety    Basal cell carcinoma    Chronic anemia    Chronic low back pain    Colon polyps    GERD (gastroesophageal reflux disease)    Hiatal hernia    HTN (hypertension)    Hypercholesteremia    Hyperlipidemia    Hypothyroidism    Insomnia    Left heart failure ([place])    Osteoporosis    S/P forteo treatment as of 02/2009   Pacemaker    Polymyalgia ([place])    rheumatica-steroids per Rheum   Systolic heart failure     Past Surgical History:  Procedure Laterality Date   ABDOMINAL HYSTERECTOMY     ANGIOPLASTY     BACK SURGERY     CARDIAC CATHETERIZATION  10/16/06   CATARACT EXTRACTION     defibulator implant     EP IMPLANTABLE DEVICE N/A 04/10/2016   Procedure: BiV Pacemaker Insertion CRT-P;  Surgeon: Evans Lance, MD;  Location: [facility];  Service: Cardiovascular;  Laterality:  N/A;   FOOT SURGERY     MITRAL VALVE REPAIR     skin cancer removal     THYROIDECTOMY     TONSILLECTOMY      Prior to Admission medications   Medication Sig Start Date End Date Taking? Authorizing Provider  allopurinol (ZYLOPRIM) 100 MG tablet Take 100 mg by mouth daily.    [provider]  amLODipine (NORVASC) 2.5 MG tablet Take 2.5 mg by mouth daily.    [provider]  aspirin EC 81 MG tablet Take 81 mg by mouth daily. Swallow whole.    [provider]  atorvastatin (LIPITOR) 40 MG tablet Take 1 tablet (40 mg total) by mouth daily. 10/06/21   Oswald Hillock, MD  carvedilol (COREG) 6.25 MG tablet Take 1 tablet (6.25 mg total) by mouth 2 (two) times daily with a meal. 10/06/21   Oswald Hillock, MD  heparin 5000 UNIT/ML injection Inject 1 mL (5,000 Units total) into the skin every 12 (twelve) hours. 10/06/21   Oswald Hillock, MD  hydrALAZINE (APRESOLINE) 25 MG tablet Take 1 tablet (25 mg total) by mouth every 6 (six) hours as needed (SBP>150 or DBP>100). 10/06/21   Oswald Hillock, MD  levothyroxine (SYNTHROID, LEVOTHROID) 75 MCG tablet Take 1 tablet (75 mcg total) by mouth daily. 11/03/14   Jettie Booze, MD  lisinopril (ZESTRIL) 10 MG tablet Take 10 mg by mouth daily.    [provider]  methylPREDNISolone (MEDROL) 2 MG tablet Take 1 tablet (2 mg total) by mouth daily. 10/06/21   Oswald Hillock, MD  omeprazole (PRILOSEC OTC) 20 MG tablet Take 20 mg by mouth 2 (two) times daily as needed (acid reflux).    [provider]  oxyCODONE (OXY IR/ROXICODONE) 5 MG immediate release tablet Take 1 tablet (5 mg total) by mouth every 6 (six) hours as needed for severe pain. 10/06/21   Oswald Hillock, MD  polyethylene glycol (MIRALAX / GLYCOLAX) 17 g packet Take 17 g by mouth daily. 10/06/21   Oswald Hillock, MD    Scheduled Meds:  sodium chloride   Intravenous Once   atorvastatin  40 mg Oral Daily   levothyroxine  75 mcg Oral Daily   methylPREDNISolone  4  mg Oral Daily   pantoprazole  40 mg Oral Daily   Continuous Infusions: PRN Meds:.acetaminophen **OR** acetaminophen, albuterol, melatonin, ondansetron **OR** ondansetron (ZOFRAN) IV  Allergies as of 10/12/2021 - Review Complete 10/12/2021  Allergen Reaction Noted   Pertussis vaccines Swelling 06/14/2015   Sulfa antibiotics Nausea Only 04/24/2011   Amoxicillin Rash and Other (See Comments) 04/24/2011    Family History  Problem Relation Age of Onset   Stroke Mother        in her 6's   Cancer Father 70       secondary   Colon cancer Brother    Colon cancer Sister    Breast cancer Sister     Social History   Socioeconomic History   Marital status: Widowed    Spouse name: Not on file   Number of children: Not on file   Years of education: Not on file   Highest education level: Not on file  Occupational History   Not on file  Tobacco Use   Smoking status: Never   Smokeless tobacco: Never  Vaping Use   Vaping Use: Never used  Substance and Sexual Activity   Alcohol use: No   Drug use: No   Sexual activity: Not on file  Other Topics Concern   Not on file  Social History Narrative   Not on file   Social Determinants of Health   Financial Resource Strain: Not on file  Food Insecurity: Not on file  Transportation Needs: Not on file  Physical Activity: Not on file  Stress: Not on file  Social Connections: Not on file  Intimate Partner Violence: Not on file    Review of Systems: Review of Systems  Constitutional:  Negative for chills and fever.  HENT:  Negative for sinus pain and sore throat.   Eyes:  Negative for pain and redness.  Respiratory:  Negative for cough and sputum production.   Cardiovascular:  Negative for chest pain and palpitations.  Gastrointestinal:  Positive for blood in stool. Negative for abdominal pain, constipation, diarrhea, heartburn, melena, nausea and vomiting.  Genitourinary:  Negative for dysuria and urgency.  Musculoskeletal:   Positive for falls. Negative for back pain.  Skin:  Negative for rash.  Neurological:  Negative for seizures and headaches.  Psychiatric/Behavioral:  Negative for suicidal ideas. The patient is not nervous/anxious.     Physical Exam:Physical Exam Constitutional:      General: She is not in acute distress.    Appearance: Normal appearance.  HENT:     Head: Normocephalic and atraumatic.     Comments: Ecchymosis around face and  eyes.    Mouth/Throat:     Mouth: Mucous membranes are moist.     Pharynx: Oropharynx is clear.  Eyes:     General: No scleral icterus.    Comments: Conjunctival pallor  Cardiovascular:     Rate and Rhythm: Normal rate and regular rhythm.  Pulmonary:     Effort: Pulmonary effort is normal. No respiratory distress.  Abdominal:     General: Abdomen is flat. Bowel sounds are normal. There is no distension.     Palpations: Abdomen is soft. There is no mass.     Tenderness: There is no abdominal tenderness. There is no guarding or rebound.     Hernia: No hernia is present.  Musculoskeletal:        General: No swelling. Normal range of motion.     Cervical back: Normal range of motion. No rigidity.  Skin:    General: Skin is warm.     Coloration: Skin is not jaundiced.  Neurological:     General: No focal deficit present.     Mental Status: She is alert and oriented to person, place, and time.  Psychiatric:        Mood and Affect: Mood normal.        Behavior: Behavior normal.        Thought Content: Thought content normal.        Judgment: Judgment normal.    Vital signs: Vitals:   10/12/21 0630 10/12/21 0751  BP: (!) 124/49 (!) 128/43  Pulse: 84 80  Resp:  15  Temp: 97.7 F (36.5 C) 97.7 F (36.5 C)  SpO2: 96% 99%        GI:  Lab Results: Recent Labs    10/10/21 0506 10/12/21 0245 10/12/21 0506  WBC 12.1* 12.0*  --   HGB 8.7* 7.9* 6.2*  HCT 26.3* 24.9* 20.1*  PLT 295 352  --    BMET Recent Labs    10/11/21 0504 10/12/21 0148  10/12/21 0245  NA 135 133* 134*  K 5.7* 5.4* 5.7*  CL 107 106 107  CO2 20* 18* 20*  GLUCOSE 79 94 93  BUN 45* 42* 41*  CREATININE 1.49* 1.29* 1.37*  CALCIUM 8.1* 7.8* 7.9*   LFT Recent Labs    10/12/21 0245  PROT 4.0*  ALBUMIN 1.9*  AST 55*  ALT 66*  ALKPHOS 89  BILITOT 0.9   PT/INR Recent Labs    10/12/21 0245  LABPROT 13.6  INR 1.0     Studies/Results: DG Chest 2 View  Result Date: 10/11/2021 CLINICAL DATA:  Difficulty swallowing. EXAM: CHEST - 2 VIEW COMPARISON:  Chest x-ray 10/01/2021. CT chest abdomen and pelvis 10/01/2021. FINDINGS: The heart is enlarged. Patient is status post cardiac surgery and valve replacement. Left-sided pacemaker is present. There is no lung consolidation, pleural effusion or pneumothorax. There is some linear scarring in the lower lungs. Vertebroplasty changes are seen at multiple levels. Chronic midthoracic compression fractures are unchanged. IMPRESSION: 1. Cardiomegaly. 2. No acute cardiopulmonary process. Electronically Signed   By: Ronney Asters M.D.   On: 10/11/2021 17:53    Impression: Acute lower GI bleeding; acute on chronic anemia -HGB 6.2 (down from 8.7 two days ago). Currently receiving 2 unit pRBCs. - BUN 41, Cr 1.37 - CT abdomen/pelvis 10/01/21: fatty liver, small inguinal hernia, nondisplaced fracture of right pubis and superior pubic ramus  Hyperkalemia - Potassium 5.7  Humerus and pelvic fracture  Transaminitis - AST 55, ALT 66 - Continue to monitor  Plan: Due to patient's physical condition at this time, colonoscopy for further evaluation of lower GI bleeding may present more risks than benefits. Recommend supportive care and continue to monitor.  Could consider bleeding scan (with IR consultation if positive).    Continue daily CBC and transfuse as needed to maintain HGB > 7   [facility] will follow    LOS: 0 days   Ahmet Schank Radford Pax  PA-C 10/12/2021, 8:10 AM  Contact #  [phone]

## 2021-10-12 NOTE — Progress Notes (Incomplete)
Late entry due to EPIC downtime  At 1 am was called by patient. Pt c/o having loose stool. When brief was checked, bright red bleeding with big clots from rectum was noted.Pt c/o burning pain to rectum. Charge nurse was called to pt's room and made notified re: bleeding. On call NP E. Marcello Moores was called, Rapid response nurse was called. Pt's is stable, AXO x 4, v/s are stable at this time. Np ordered STAT blood work, awaiting results. Consult to hospital list , awaiting. Pt still has bleeding with big clots.

## 2021-10-12 NOTE — Progress Notes (Signed)
Received a call from [place] at 1:12 am reporting [name] having hematochezia, VSS. Stat CBC,CMP, T&C and Pt/Ptt/INR was ordered. Also asked for the rapid nurse to be called. Placed a page to hospitalist. Spoke with Dr Cyd Silence, regarding the above  Results pending at this time.  2:00 Vitals: 142/54 P 92 and 97  %  saturation.  [name] called with results at 03:35 HGB 7.9 and [name] still having hematochezia. Placed a call to Dr Cyd Silence regarding the above, he will have someone to come evaluate [name] and she will be transfer to acute care service.  03:29 vitas: BP 111/85 P 92 and O2 saturation 99%.  Dr Cyd Silence gave order to discontinue Lovenox and order for SCD was given to [facility]. We will continue to monitor.

## 2021-10-12 NOTE — Progress Notes (Incomplete)
Late entry secondary to Epic downtime [place], [place]- [id number] 0105 CN notified by assigned RN of patient active rectal bleed with large clots Patient was assessed, incontinent care provided and VS taken and recorded 0112-on call Danella Sensing, PA)notified,and informed of status changes with current VS, orders received for STAT--CBC, CMP and Type and Screen, states she will notify Hospital[facility], MD for consult. 0114 -Rapid Response was notified to assess patient and on   0116 Lab/ notified concerning labs and writer was informed to notify-phlebotomy.-Phlebotomy notified for Stat labs, [facility] staff on department for stat collection. Writer was notified by Danella Sensing- update provided and additional orders received for PT/PTT/INR. Informed to monitor VS and assess Q 29mins 0336 Danella Sensing, PA informed of results of lab, states she will notify for consult

## 2021-10-12 NOTE — Progress Notes (Signed)
Physical Therapy Note  Patient Details  Name: [name] MRN: [id number] Date of Birth: [birth year] Today's Date: 10/12/2021  Physical Therapy Discharge Note  This patient was unable to complete the inpatient rehab program due to medical reasons and transfer back to acute care; therefore did not meet their long term goals. Pt left the program at a moderate assist level for their functional mobility/ transfers. This patient is being discharged from PT services at this time.  Pt's perception of pain in the last five days was unable to answer at this time.   See CareTool for functional status details  If the patient is able to return to inpatient rehabilitation within 3 midnights, this may be considered an interrupted stay and therapy services will resume as ordered. Modification and reinstatement of their goals will be made upon completion of therapy service reevaluations.       Breck Coons, PT, DPT 10/12/2021, 3:50 PM

## 2021-10-12 NOTE — Progress Notes (Signed)
Lab notified this writer that pt hemoglobin is 6.2 , Md notified .

## 2021-10-12 NOTE — Progress Notes (Addendum)
I was this patients nurse this morning. When I got here the patient needed blood transfused. I gave her 1 unit of RBC. She tolerated well. When it was finished I changed her brief and there was significant bleeding and blood clots in her stool. I thought we usually gave one unit and check hemoglobin before we give the 2nd unit but I gave my patient to the flex nurse and she is aware that she may need another unit of RBC. Her hemoglobin is up to 10.2 at 1230 pm today so they could wait until tomorrow to transfuse another unit.

## 2021-10-12 NOTE — Progress Notes (Signed)
Inpatient Rehabilitation Care Coordinator Discharge Note   Patient Details  Name: [name] MRN: [id number] Date of Birth: [birth year]   Discharge location: D/c to acute due to medical reasons  Length of Stay: 5 days  Discharge activity level: Min A  Home/community participation: N/A  Patient response SA:YTKZSW Literacy - How often do you need to have someone help you when you read instructions, pamphlets, or other written material from your doctor or pharmacy?: Never  Patient response FU:XNATFT Isolation - How often do you feel lonely or isolated from those around you?: Never  Services provided included: MD, RD, PT, OT, RN, CM, TR, Pharmacy, Neuropsych, SW  Financial Services: Medicare A/B      Choices offered to/list presented to:  N/A  Follow-up services arranged:    Patient response to transportation need: Is the patient able to respond to transportation needs?: Yes In the past 12 months, has lack of transportation kept you from medical appointments or from getting medications?: No In the past 12 months, has lack of transportation kept you from meetings, work, or from getting things needed for daily living?: No    Comments (or additional information):   Patient/Family verbalized understanding of follow-up arrangements:     Individual responsible for coordination of the follow-up plan:    Confirmed correct DME delivered: Rana Snare 10/12/2021    Rana Snare

## 2021-10-12 NOTE — H&P (Addendum)
History and Physical    [name] IHK:[id number] DOB: Jul 01, 1932 DOA: 10/12/2021  Referring MD/NP/PA: Mitzi Hansen, MD PCP: Lawerance Cruel, MD  Patient coming from: Transfer from rehab  Chief Complaint: Passing blood clots  I have personally briefly reviewed patient's old medical records in [place]   HPI: [name] is a 87 y.o. female with medical history significant of HTN, recently diagnosed PAF on Eliquis, nonischemic cardiomyopathy, SSS s/p PPM, mitral stenosis s/p repair, hypothyroidism, CKD stage III, iron deficiency anemia, PMR on chronic steroids, and chronic back pain presents back to the hospital from rehab with blood in stool.  She had just recently been hospitalized from 11/6-11/11 after fall sustaining transverse proximal humeral head fracture, right pubic ramus, and superior pubic rami fractures.  Ortho has been consulted but recommended nonoperative management.  During her hospital course she had few runs of asymptomatic V. tach and had acute on chronic anemia requiring 1 unit of PRBCs.  Found to have concern for UTI that grew out 30,000 colonies per milliliter of Enterococcus faecalis treated with cefepime.  Found also to have AKI for which Lasix and ACE inhibitor's were held with improvement in kidney function 1.96-1.59 prior to discharge to rehab.    While in rehab patient had initially noted complaints of nausea and constipation, but constipation improved with bowel regimen and KUBs did not show any signs of obstruction.  Nausea continued for which patient had been receiving antiemetics.  Labs noted hyperkalemia and worsening kidney function that initially improved with IV fluids.  She was noted to have some urinary retention and had Foley catheter had to be placed.  On 11/14 she was noted to have soft blood pressures which some medications had to be held temporarily.  Then had a choking episode followed by difficulty swallowing 1 ago where she was noted to  regurgitate applesauce as well as sips of water and Coke.  Evaluated by GI, but did not feel food impaction was cause of symptoms at this time.  She had been feeling her best prior to choking episode.  Yesterday afternoon patient reports that she started passing blood clots in her stool.  Noted associated symptoms of crampy lower abdominal pain with pressure in the rectum.  Vital signs were otherwise noted to be stable, but w transfer requested to the hospitalist service.    Hemoglobin had dropped down to 8.7-> 7.9-> 6.2 g/dL.  Accepted to a progressive bed. Patient had been typed and screened and ordered 2 units of packed red blood cells.  She had been receiving subcu Lovenox, but Eliquis had been on hold.  ED Course: As seen above  Review of Systems  Constitutional:  Positive for malaise/fatigue. Negative for fever.  HENT:  Negative for congestion.   Eyes:  Negative for photophobia and pain.  Respiratory:  Negative for cough and shortness of breath.   Cardiovascular:  Positive for leg swelling. Negative for chest pain.  Gastrointestinal:  Positive for abdominal pain, blood in stool and nausea. Negative for vomiting.  Genitourinary:  Negative for dysuria.  Musculoskeletal:  Positive for myalgias.  Neurological:  Negative for loss of consciousness.  Endo/Heme/Allergies:  Bruises/bleeds easily.  Psychiatric/Behavioral:  Negative for substance abuse.    Past Medical History:  Diagnosis Date   Allergic rhinitis    Anemia    iron deficient   Aneurysm (HCC)    Anxiety    Basal cell carcinoma    Chronic anemia    Chronic low back pain  Colon polyps    GERD (gastroesophageal reflux disease)    Hiatal hernia    HTN (hypertension)    Hypercholesteremia    Hyperlipidemia    Hypothyroidism    Insomnia    Left heart failure ([place])    Osteoporosis    S/P forteo treatment as of 02/2009   Pacemaker    Polymyalgia ([place])    rheumatica-steroids per Rheum   Systolic heart failure     Past  Surgical History:  Procedure Laterality Date   ABDOMINAL HYSTERECTOMY     ANGIOPLASTY     BACK SURGERY     CARDIAC CATHETERIZATION  10/16/06   CATARACT EXTRACTION     defibulator implant     EP IMPLANTABLE DEVICE N/A 04/10/2016   Procedure: BiV Pacemaker Insertion CRT-P;  Surgeon: Evans Lance, MD;  Location: [facility];  Service: Cardiovascular;  Laterality: N/A;   FOOT SURGERY     MITRAL VALVE REPAIR     skin cancer removal     THYROIDECTOMY     TONSILLECTOMY       reports that she has never smoked. She has never used smokeless tobacco. She reports that she does not drink alcohol and does not use drugs.  Allergies  Allergen Reactions   Pertussis Vaccines Swelling   Sulfa Antibiotics Nausea Only   Amoxicillin Rash and Other (See Comments)    Has patient had a PCN reaction causing immediate rash, facial/tongue/throat swelling, SOB or lightheadedness with hypotension: yes Has patient had a PCN reaction causing severe rash involving mucus membranes or skin necrosis: no Has patient had a PCN reaction that required hospitalization no Has patient had a PCN reaction occurring within the last 10 years: yes If all of the above answers are "NO", then may proceed with Cephalosporin use.     Family History  Problem Relation Age of Onset   Stroke Mother        in her 80's   Cancer Father 48       secondary   Colon cancer Brother    Colon cancer Sister    Breast cancer Sister     Prior to Admission medications   Medication Sig Start Date End Date Taking? Authorizing Provider  allopurinol (ZYLOPRIM) 100 MG tablet Take 100 mg by mouth daily.    [provider]  amLODipine (NORVASC) 2.5 MG tablet Take 2.5 mg by mouth daily.    [provider]  aspirin EC 81 MG tablet Take 81 mg by mouth daily. Swallow whole.    [provider]  atorvastatin (LIPITOR) 40 MG tablet Take 1 tablet (40 mg total) by mouth daily. 10/06/21   Oswald Hillock, MD  carvedilol  (COREG) 6.25 MG tablet Take 1 tablet (6.25 mg total) by mouth 2 (two) times daily with a meal. 10/06/21   Oswald Hillock, MD  heparin 5000 UNIT/ML injection Inject 1 mL (5,000 Units total) into the skin every 12 (twelve) hours. 10/06/21   Oswald Hillock, MD  hydrALAZINE (APRESOLINE) 25 MG tablet Take 1 tablet (25 mg total) by mouth every 6 (six) hours as needed (SBP>150 or DBP>100). 10/06/21   Oswald Hillock, MD  levothyroxine (SYNTHROID, LEVOTHROID) 75 MCG tablet Take 1 tablet (75 mcg total) by mouth daily. 11/03/14   Jettie Booze, MD  lisinopril (ZESTRIL) 10 MG tablet Take 10 mg by mouth daily.    [provider]  methylPREDNISolone (MEDROL) 2 MG tablet Take 1 tablet (2 mg total) by mouth  daily. 10/06/21   Oswald Hillock, MD  omeprazole (PRILOSEC OTC) 20 MG tablet Take 20 mg by mouth 2 (two) times daily as needed (acid reflux).    [provider]  oxyCODONE (OXY IR/ROXICODONE) 5 MG immediate release tablet Take 1 tablet (5 mg total) by mouth every 6 (six) hours as needed for severe pain. 10/06/21   Oswald Hillock, MD  polyethylene glycol (MIRALAX / GLYCOLAX) 17 g packet Take 17 g by mouth daily. 10/06/21   Oswald Hillock, MD    Physical Exam:  Constitutional: Elderly female who appears to be chronically ill, but no acute Vitals:   10/12/21 0630  BP: (!) 124/49  Pulse: 84  Temp: 97.7 F (36.5 C)  TempSrc: Oral  SpO2: 96%   Eyes: PERRL, lids and conjunctivae normal ENMT: Mucous membranes are moist. Posterior pharynx clear of any exudate or lesions.  Neck: normal, supple, no masses, no thyromegaly Respiratory: clear to auscultation bilaterally, no wheezing, no crackles. Normal respiratory effort. No accessory muscle use.  Cardiovascular: Regular rate and rhythm, no murmurs / rubs / gallops. No extremity edema. 2+ pedal pulses. No carotid bruits.  Abdomen: Mild tenderness palpation of the lower abdomen.  Bowel sounds present.  All in the room patient had bowel movement  with blood present in stool Musculoskeletal: no clubbing / cyanosis.  Right arm in a sling.  Skin: Significant bruising noted of the face with skin wound of right lower extremity Neurologic: CN 2-12 grossly intact. Sensation intact, DTR normal. Strength 5/5 in all 4.  Psychiatric: Normal judgment and insight. Alert and oriented x 3. Normal mood.     Labs on Admission: I have personally reviewed following labs and imaging studies  CBC: Recent Labs  Lab 10/07/21 1434 10/08/21 0531 10/09/21 0510 10/10/21 0506 10/12/21 0245 10/12/21 0506  WBC 10.4 10.3 11.7* 12.1* 12.0*  --   NEUTROABS 8.3* 6.3  --  7.5 8.2*  --   HGB 8.8* 8.3* 8.5* 8.7* 7.9* 6.2*  HCT 26.4* 25.4* 26.3* 26.3* 24.9* 20.1*  MCV 97.1 97.3 98.1 98.1 99.6  --   PLT 201 227 294 295 352  --    Basic Metabolic Panel: Recent Labs  Lab 10/09/21 0510 10/10/21 0506 10/11/21 0504 10/12/21 0148 10/12/21 0245  NA 136 135 135 133* 134*  K 5.4* 5.3* 5.7* 5.4* 5.7*  CL 109 107 107 106 107  CO2 21* 19* 20* 18* 20*  GLUCOSE 87 86 79 94 93  BUN 44* 38* 45* 42* 41*  CREATININE 1.13* 1.22* 1.49* 1.29* 1.37*  CALCIUM 8.4* 8.1* 8.1* 7.8* 7.9*   GFR: Estimated Creatinine Clearance: 19.1 mL/min (A) (by C-G formula based on SCr of 1.37 mg/dL (H)). Liver Function Tests: Recent Labs  Lab 10/07/21 1434 10/12/21 0245  AST 60* 55*  ALT 59* 66*  ALKPHOS 72 89  BILITOT 0.9 0.9  PROT 5.0* 4.0*  ALBUMIN 1.9* 1.9*   No results for input(s): LIPASE, AMYLASE in the last 168 hours. No results for input(s): AMMONIA in the last 168 hours. Coagulation Profile: Recent Labs  Lab 10/12/21 0245  INR 1.0   Cardiac Enzymes: No results for input(s): CKTOTAL, CKMB, CKMBINDEX, TROPONINI in the last 168 hours. BNP (last 3 results) No results for input(s): PROBNP in the last 8760 hours. HbA1C: No results for input(s): HGBA1C in the last 72 hours. CBG: Recent Labs  Lab 10/06/21 0010 10/06/21 0434 10/06/21 0815 10/06/21 1116  10/06/21 1619  GLUCAP 102* 79 80 112* 139*  Lipid Profile: No results for input(s): CHOL, HDL, LDLCALC, TRIG, CHOLHDL, LDLDIRECT in the last 72 hours. Thyroid Function Tests: No results for input(s): TSH, T4TOTAL, FREET4, T3FREE, THYROIDAB in the last 72 hours. Anemia Panel: No results for input(s): VITAMINB12, FOLATE, FERRITIN, TIBC, IRON, RETICCTPCT in the last 72 hours. Urine analysis:    Component Value Date/Time   COLORURINE YELLOW 10/09/2021 1329   APPEARANCEUR CLEAR 10/09/2021 1329   LABSPEC 1.013 10/09/2021 1329   PHURINE 6.0 10/09/2021 1329   GLUCOSEU NEGATIVE 10/09/2021 1329   HGBUR MODERATE (A) 10/09/2021 1329   BILIRUBINUR NEGATIVE 10/09/2021 1329   KETONESUR NEGATIVE 10/09/2021 1329   PROTEINUR 30 (A) 10/09/2021 1329   UROBILINOGEN 0.2 10/18/2010 1650   NITRITE NEGATIVE 10/09/2021 1329   LEUKOCYTESUR NEGATIVE 10/09/2021 1329   Sepsis Labs: Recent Results (from the past 240 hour(s))  Culture, blood (routine x 2)     Status: None   Collection Time: 10/03/21  7:35 AM   Specimen: BLOOD RIGHT HAND  Result Value Ref Range Status   Specimen Description BLOOD RIGHT HAND  Final   Special Requests   Final    BOTTLES DRAWN AEROBIC AND ANAEROBIC Blood Culture adequate volume   Culture   Final    NO GROWTH 5 DAYS Performed at [facility], [place] [address], [place], [place] [address]    Report Status 10/08/2021 FINAL  Final  Culture, blood (routine x 2)     Status: None   Collection Time: 10/03/21  7:35 AM   Specimen: BLOOD RIGHT ARM  Result Value Ref Range Status   Specimen Description BLOOD RIGHT ARM  Final   Special Requests   Final    BOTTLES DRAWN AEROBIC ONLY Blood Culture results may not be optimal due to an inadequate volume of blood received in culture bottles   Culture   Final    NO GROWTH 5 DAYS Performed at [facility], [place] [address] [place], [place] [address]    Report Status 10/08/2021 FINAL  Final  Urine Culture     Status: Abnormal    Collection Time: 10/03/21 11:17 AM   Specimen: Urine, Clean Catch  Result Value Ref Range Status   Specimen Description URINE, CLEAN CATCH  Final   Special Requests   Final    NONE Performed at [facility], [place] [address], [place], [place] [address]    Culture 30,000 COLONIES/mL ENTEROCOCCUS FAECALIS (A)  Final   Report Status 10/05/2021 FINAL  Final   Organism ID, Bacteria ENTEROCOCCUS FAECALIS (A)  Final      Susceptibility   Enterococcus faecalis - MIC*    AMPICILLIN <=2 SENSITIVE Sensitive     NITROFURANTOIN <=16 SENSITIVE Sensitive     VANCOMYCIN 1 SENSITIVE Sensitive     * 30,000 COLONIES/mL ENTEROCOCCUS FAECALIS  Urine Culture     Status: None   Collection Time: 10/09/21  1:29 PM   Specimen: Urine, Catheterized  Result Value Ref Range Status   Specimen Description URINE, CATHETERIZED  Final   Special Requests NONE  Final   Culture   Final    NO GROWTH Performed at [facility], [place] [address] [place], [place] [address]    Report Status 10/11/2021 FINAL  Final     Radiological Exams on Admission: DG Chest 2 View  Result Date: 10/11/2021 CLINICAL DATA:  Difficulty swallowing. EXAM: CHEST - 2 VIEW COMPARISON:  Chest x-ray 10/01/2021. CT chest abdomen and pelvis 10/01/2021. FINDINGS: The heart is enlarged.  Patient is status post cardiac surgery and valve replacement. Left-sided pacemaker is present. There is no lung consolidation, pleural effusion or pneumothorax. There is some linear scarring in the lower lungs. Vertebroplasty changes are seen at multiple levels. Chronic midthoracic compression fractures are unchanged. IMPRESSION: 1. Cardiomegaly. 2. No acute cardiopulmonary process. Electronically Signed   By: Ronney Asters M.D.   On: 10/11/2021 17:53    EKG: Independently reviewed.  Paced normal sinus rhythm  at84 bpm   Assessment/Plan Acute lower GI bleeding acute blood loss anemia: Hemoglobin dropped as low as 6.2 on of after reports of cramping lower  abdominal pain.  Just had CT done on 11/6 which noted pelvic fractures and also noted small right inguinal hernia without signs of obstruction.  Patient was typed and screened and ordered 2 units of packed red blood cells.  Gastroenterology has been consulted, but due to patient's current physical state deferred colonoscopy at this time.  I would suspect is possibly lower GI bleed, but was also noted to have elevated BUN which could give concern for upper source. -Admit to a progressive bed -Strict I&Os -Addendum: Patient completed 1 unit of packed red blood cells and repeat H&H was 10.4.  Second unit to be transfused was held -Clear liquid diet  -Check abdominal x-ray noted concern for ileus versus gastroenteritis.  Orders placed to check CT scan of the abdomen and pelvis -Serial monitoring of H&H.  Transfuse blood products as needed for hemoglobin less than 8 g/dL -Appreciate GI consultative services, we will follow-up for any further recommendations  Leukocytosis: WBC elevated up to 12.1.  Suspect possibly reactive in nature secondary to above. -Continue to monitor  Hyperkalemia: Acute.  Potassium elevated to 5.7 levels had slowly been trending up currently 5.7.  While in rehab had improved with IV hydration. -Lasix 20 mg IV x1 dose after initiation of blood products -Recheck potassium levels thereafter blood products given for  Dysphagia: Acute.  Patient was noted to have had a choking episode followed by difficulty swallowing 1 to 2 days ago.  Noted to regurgitate applesauce as well as sips of water and Coke.  Evaluated by GI, but did not feel food impaction was cause of symptoms at this time. -Aspiration precaution -Speech therapy consulted.  Recommended dys 2 solids and thin liquids, due to right arm immobility and possible esophageal issues. Recommend consideration of regular barium swallow when medically approved.   Chronic kidney disease stage IIIb: Creatinine had improved down to 1.13  while in rehab with IV, but had recently trended back up to 1.37 with BUN 41.  Suspect prerenal cause of symptoms likely related with blood loss, but patient had also been noted to have issues with urinary retention. -Continue Foley catheter for now -Continue to monitor kidney function  Essential hypertension: At discharge from hospital blood pressure medications include amlodipine 2.5 mg daily, Coreg 6.25 mg twice daily, hydralazine 25 mg as needed for elevated blood pressures, and lisinopril 10 mg daily.  Due to soft blood pressures during rehab stay patient appeared to have been taken off of all blood pressure medications.  Blood pressures currently 111/79-151/55. -Hydralazine p.o. as needed -Continue to monitor and resume other blood pressure medications when medically her appropriate  Paroxysmal atrial fibrillation: Patient had been on Eliquis prior to admission into the hospital on 11/6.  However, prior to discharge to rehab had been held due to acute drop in blood counts requiring blood transfusion. -Continue to monitor off of anticoagulation due to GI bleed  Heart failure with preserved EF: Last EF noted to be 55 to 60% with indeterminate diastolic parameters on 62/02/4694.  Chest x-ray did not note any acute abnormalities.  Patient appears to be relatively euvolemic at this time -Strict I&Os and daily weights -Consider additional need of Lasix following blood transfusions as needed  Humerus and pelvic fracture 2/2 fall : Prior to arrival.  Occurred after fall 11/6, and 1 evaluated by orthopedics for which nonoperative management was recommended. -PT/OT consulted to continue therapies -Resume oxycodone as needed for pain S/p Biv PPM  Transaminitis: Acute.  AST 55 and ALT 66. -Continue to monitor  Hypothyroid -Check TSH -Continue levothyroxine  Hyperlipidemia -Continue atorvastatin  GERD Hiatal hernia -Continue Protonix  DVT prophylaxis: SCDs Code Status: Full Family  Communication: Daughter updated over phone Disposition Plan: Hopefully discharge back to rehab once medically stable Consults called: Gastroenterology Admission status: Inpatient, require more than 2 midnight stay for acute GI bleed  Norval Morton MD [facility]   If 7PM-7AM, please contact night-coverage   10/12/2021, 7:12 AM

## 2021-10-12 NOTE — Progress Notes (Signed)
Report given to RN (Margaret)for room [phone] . Pt is transferred with all her belongings. Pt alert and oriented, no signs of acute distress.

## 2021-10-12 NOTE — Progress Notes (Signed)
Inpatient Rehabilitation Admissions Coordinator   I met at bedside and spoke with her daughter , Naaman Plummer, by phone to update her. I will follow as we await medical stability to readmit to CIR.  Danne Baxter, RN, MSN Rehab Admissions Coordinator [phone] 10/12/2021 3:15 PM

## 2021-10-12 NOTE — Progress Notes (Signed)
Occupational Therapy Discharge Note  This patient was unable to complete the inpatient rehab program due to GI bleed and return to acute; therefore did not meet their long term goals. Pt left the program at a max A level assist level for their  functional ADLs. This patient is being discharged from OT services at this time.  BIMS at time of d/c  Pt unable to complete due to medical status  See CareTool for functional status details.  If the patient is able to return to inpatient rehabilitation within 3 midnights, this may be considered an interrupted stay and therapy services will resume as ordered. Modification and reinstatement of their goals will be made upon completion of therapy service reevaluations.

## 2021-10-12 NOTE — Evaluation (Signed)
Clinical/Bedside Swallow Evaluation Patient Details  Name: [name] MRN: [id number] Date of Birth: June 11, 1932  Today's Date: 10/12/2021 Time: SLP Start Time (ACUTE ONLY): 8657 SLP Stop Time (ACUTE ONLY): 1215 SLP Time Calculation (min) (ACUTE ONLY): 30 min  Past Medical History:  Past Medical History:  Diagnosis Date   Allergic rhinitis    Anemia    iron deficient   Aneurysm ([place])    Anxiety    Basal cell carcinoma    Chronic anemia    Chronic low back pain    Colon polyps    GERD (gastroesophageal reflux disease)    Hiatal hernia    HTN (hypertension)    Hypercholesteremia    Hyperlipidemia    Hypothyroidism    Insomnia    Left heart failure (HCC)    Osteoporosis    S/P forteo treatment as of 02/2009   Pacemaker    Polymyalgia ([place])    rheumatica-steroids per Rheum   Systolic heart failure    Past Surgical History:  Past Surgical History:  Procedure Laterality Date   ABDOMINAL HYSTERECTOMY     ANGIOPLASTY     BACK SURGERY     CARDIAC CATHETERIZATION  10/16/06   CATARACT EXTRACTION     defibulator implant     EP IMPLANTABLE DEVICE N/A 04/10/2016   Procedure: BiV Pacemaker Insertion CRT-P;  Surgeon: Evans Lance, MD;  Location: [facility];  Service: Cardiovascular;  Laterality: N/A;   FOOT SURGERY     MITRAL VALVE REPAIR     skin cancer removal     THYROIDECTOMY     TONSILLECTOMY     HPI:  87yo female initially admitted 10/01/21 after a fall with transverse proximal humeral head fx and right pubic rami and superior rami fx. CIR 11/11-17/22. Transferred to 4N06 due to rectal bleeding. PMH: GERD, hiatal hernia, hypothyroidism, systolic heart failure, HTN, chronic LBP, anemia, PAF, PPM, CKD, PMR    Assessment / Plan / Recommendation  Clinical Impression  Pt seen at bedside for swallow evaluation. Pt on clear liquids due to GI bleed. MD cleared pt to receive solid foods. Pt reports esophageal symptoms, stating food "doesn't want to go down". She also  indicates frequent belching, another sign of esophageal dysmotility. Pt presents with adequate natural dentition. CN exam unremarkable. Pt accepted trials of thin liquid, puree, and solid textures. No difficulty observed with straw sips or puree. Pt was noted to eat solid item slowly, taking small bites and chewing well. No overt s/s aspiration or oral residue noted. Recommend dys 2 diet and thin liquids when ok with GI MD, as pt's right arm is not able to be used well at this time. Finely chopped solids may facilitate esophageal clearing. Anticipate being able to advance diet with improved esophageal clearing and upper extremity movement. Recommend a regular barium swallow to more formally assess esophageal motility. Safe swallow precautions posted at [facility]. No further ST intervention recommended at this time. Please reconsult if needs arise.  SLP Visit Diagnosis: Dysphagia, unspecified (R13.10)    Aspiration Risk  Mild aspiration risk    Diet Recommendation Clear liquids until ok with GI Dys2/thin liquid  Liquid Administration via: Straw;Cup Medication Administration: Whole meds with liquid Supervision: Patient able to self feed Compensations: Minimize environmental distractions;Slow rate;Small sips/bites;Other (Comment) (begin meal with warm liquid) Postural Changes: Seated upright at 90 degrees;Remain upright for at least 30 minutes after po intake    Other  Recommendations Recommended Consults: Other (Comment) (rec consider regular barium  swallow) Oral Care Recommendations: Oral care BID    Recommendations for follow up therapy are one component of a multi-disciplinary discharge planning process, led by the attending physician.  Recommendations may be updated based on patient status, additional functional criteria and insurance authorization.  Follow up Recommendations No SLP follow up      Assistance Recommended at Discharge None  Functional Status Assessment Patient has had a recent  decline in their functional status and demonstrates the ability to make significant improvements in function in a reasonable and predictable amount of time.      Prognosis good     Swallow Study   General Date of Onset: 10/12/21 HPI: 87yo female initially admitted 10/01/21 after a fall with transverse proximal humeral head fx and right pubic rami and superior rami fx. CIR 11/11-17/22. Transferred to 4N06 due to rectal bleeding. PMH: GERD, hiatal hernia, hypothyroidism, systolic heart failure, HTN, chronic LBP, anemia, PAF, PPM, CKD, PMR Type of Study: Bedside Swallow Evaluation Previous Swallow Assessment: none Diet Prior to this Study: Other (Comment) (clear liquid) Temperature Spikes Noted: No Respiratory Status: Room air History of Recent Intubation: No Behavior/Cognition: Alert;Cooperative;Pleasant mood Oral Cavity Assessment: Within Functional Limits Oral Care Completed by SLP: No Oral Cavity - Dentition: Adequate natural dentition Vision: Functional for self-feeding Self-Feeding Abilities: Able to feed self;Needs assist;Needs set up Patient Positioning: Upright in bed Baseline Vocal Quality: Normal Volitional Cough: Strong Volitional Swallow: Able to elicit    Oral/Motor/Sensory Function Overall Oral Motor/Sensory Function: Within functional limits   Ice Chips Ice chips: Not tested   Thin Liquid Thin Liquid: Within functional limits Presentation: Straw    Nectar Thick Nectar Thick Liquid: Not tested   Honey Thick Honey Thick Liquid: Not tested   Puree Puree: Within functional limits Presentation: Spoon   Solid     Solid: Within functional limits Presentation: Sandston B. Quentin Ore, [facility], [place] Speech Language Pathologist Office: [phone]  Shonna Chock 10/12/2021,12:28 PM

## 2021-10-12 NOTE — H&P (Incomplete)
Report given to RN (Margaret)for room [phone] . Pt is transferred with all her belongings. Pt alert and oriented, no signs of acute distress.

## 2021-10-13 ENCOUNTER — Inpatient Hospital Stay (HOSPITAL_COMMUNITY): Payer: Medicare Other

## 2021-10-13 DIAGNOSIS — D72829 Elevated white blood cell count, unspecified: Secondary | ICD-10-CM | POA: Diagnosis not present

## 2021-10-13 DIAGNOSIS — Z95810 Presence of automatic (implantable) cardiac defibrillator: Secondary | ICD-10-CM | POA: Diagnosis not present

## 2021-10-13 DIAGNOSIS — D649 Anemia, unspecified: Secondary | ICD-10-CM

## 2021-10-13 DIAGNOSIS — K922 Gastrointestinal hemorrhage, unspecified: Secondary | ICD-10-CM | POA: Diagnosis not present

## 2021-10-13 DIAGNOSIS — I480 Paroxysmal atrial fibrillation: Secondary | ICD-10-CM | POA: Diagnosis not present

## 2021-10-13 LAB — BPAM RBC
Blood Product Expiration Date: 202211171039
ISSUE DATE / TIME: 202211170812
Unit Type and Rh: 5100

## 2021-10-13 LAB — HEMOGLOBIN
Hemoglobin: 9.3 g/dL — ABNORMAL LOW (ref 12.0–15.0)
Hemoglobin: 9.3 g/dL — ABNORMAL LOW (ref 12.0–15.0)

## 2021-10-13 LAB — COMPREHENSIVE METABOLIC PANEL
ALT: 54 U/L — ABNORMAL HIGH (ref 0–44)
AST: 52 U/L — ABNORMAL HIGH (ref 15–41)
Albumin: 2 g/dL — ABNORMAL LOW (ref 3.5–5.0)
Alkaline Phosphatase: 77 U/L (ref 38–126)
Anion gap: 6 (ref 5–15)
BUN: 32 mg/dL — ABNORMAL HIGH (ref 8–23)
CO2: 19 mmol/L — ABNORMAL LOW (ref 22–32)
Calcium: 7.5 mg/dL — ABNORMAL LOW (ref 8.9–10.3)
Chloride: 111 mmol/L (ref 98–111)
Creatinine, Ser: 1.01 mg/dL — ABNORMAL HIGH (ref 0.44–1.00)
GFR, Estimated: 54 mL/min — ABNORMAL LOW (ref >60)
Glucose, Bld: 77 mg/dL (ref 70–99)
Potassium: 4.8 mmol/L (ref 3.5–5.1)
Sodium: 136 mmol/L (ref 135–145)
Total Bilirubin: 0.8 mg/dL (ref 0.3–1.2)
Total Protein: 4.3 g/dL — ABNORMAL LOW (ref 6.5–8.1)

## 2021-10-13 LAB — TYPE AND SCREEN
ABO/RH(D): O POS
Antibody Screen: NEGATIVE
Unit division: 0

## 2021-10-13 LAB — HEMATOCRIT
HCT: 27.6 % — ABNORMAL LOW (ref 36.0–46.0)
HCT: 28.3 % — ABNORMAL LOW (ref 36.0–46.0)

## 2021-10-13 MED ORDER — SODIUM ZIRCONIUM CYCLOSILICATE 5 G PO PACK
5.0000 g | PACK | Freq: Two times a day (BID) | ORAL | Status: DC
  Filled 2021-10-13: qty 1

## 2021-10-13 MED ORDER — ONDANSETRON HCL 4 MG PO TABS
4.0000 mg | ORAL_TABLET | Freq: Two times a day (BID) | ORAL | Status: DC
  Administered 2021-10-13 – 2021-10-18 (×10): 4 mg via ORAL
  Filled 2021-10-13 (×11): qty 1

## 2021-10-13 MED ORDER — HYDROCODONE-ACETAMINOPHEN 10-325 MG PO TABS
1.0000 | ORAL_TABLET | Freq: Three times a day (TID) | ORAL | Status: DC
  Administered 2021-10-13 – 2021-10-18 (×16): 1 via ORAL
  Filled 2021-10-13 (×16): qty 1

## 2021-10-13 NOTE — Progress Notes (Signed)
Occupational Therapy Discharge Note   This patient was unable to complete the inpatient rehab program due to GI bleed and return to acute; therefore did not meet their long term goals. Pt left the program at a max A level assist level for their  functional ADLs. This patient is being discharged from OT services at this time.   BIMS at time of d/c  Pt unable to complete due to medical status   See CareTool for functional status details.   If the patient is able to return to inpatient rehabilitation within 3 midnights, this may be considered an interrupted stay and therapy services will resume as ordered. Modification and reinstatement of their goals will be made upon completion of therapy service reevaluations.

## 2021-10-13 NOTE — Discharge Summary (Signed)
Physician Discharge Summary  Patient ID: [name] MRN: [id number] DOB/AGE: 85-13-1933 85 y.o.  Admit date: 10/06/2021 Discharge date: 10/12/2021  Discharge Diagnoses:  Principal Problem:   Acute lower GI bleeding Active Problems:   Hypertension, benign   PAF (paroxysmal atrial fibrillation) (HCC)   Closed fracture of right pubis [facility])   Old age   Pelvic fracture (HCC)   Acute blood loss anemia   Leukocytosis   Discharged Condition: serious  Significant Diagnostic Studies: DG Chest 2 View  Result Date: 10/11/2021 CLINICAL DATA:  Difficulty swallowing. EXAM: CHEST - 2 VIEW COMPARISON:  Chest x-ray 10/01/2021. CT chest abdomen and pelvis 10/01/2021. FINDINGS: The heart is enlarged. Patient is status post cardiac surgery and valve replacement. Left-sided pacemaker is present. There is no lung consolidation, pleural effusion or pneumothorax. There is some linear scarring in the lower lungs. Vertebroplasty changes are seen at multiple levels. Chronic midthoracic compression fractures are unchanged. IMPRESSION: 1. Cardiomegaly. 2. No acute cardiopulmonary process. Electronically Signed   By: Ronney Asters M.D.   On: 10/11/2021 17:53    DG Abd 1 View  Result Date: 10/07/2021 CLINICAL DATA:  Constipation K59.00 (ICD-10-CM) EXAM: ABDOMEN - 1 VIEW COMPARISON:  CT October 01, 2021. FINDINGS: Gas is noted in small bowel and colon. While the overall pattern is nonobstructive, there are few mildly dilated small bowel loops and probably dilated distal colon. No significant stool burden. No obvious free air on this limited single supine radiograph. No abnormal calcifications identified; however, bowel gas limits evaluation. Degenerative changes of the sign with kyphoplasty at multiple lower thoracic and lumbar levels. Incompletely imaged cardiac rhythm maintenance device. Partially imaged pelvic thermistor. Partially imaged nondisplaced fracture of the right pubis and superior pubic ramus, better  characterized on recent CT. IMPRESSION: 1. Gas is noted in small bowel and colon. While the overall pattern is nonobstructive, there are few mildly dilated small bowel loops and probably dilated distal colon. No significant stool burden. A CT abdomen/pelvis could better evaluate for obstruction if clinically indicated. 2. Partially imaged nondisplaced fracture of the right pubis and superior pubic ramus, better characterized on recent CT. Electronically Signed   By: Margaretha Sheffield M.D.   On: 10/07/2021 10:17   C7:22    Labs:  Basic Metabolic Panel: Recent Labs  Lab 10/07/21 1434 10/08/21 0531 10/09/21 0510 10/10/21 0506 10/11/21 0504 10/12/21 0148 10/12/21 0245  NA 135 135 136 135 135 133* 134*  K 5.3* 5.0 5.4* 5.3* 5.7* 5.4* 5.7*  CL 108 109 109 107 107 106 107  CO2 21* 19* 21* 19* 20* 18* 20*  GLUCOSE 158* 93 87 86 79 94 93  BUN 61* 54* 44* 38* 45* 42* 41*  CREATININE 1.37* 1.16* 1.13* 1.22* 1.49* 1.29* 1.37*  CALCIUM 7.9* 7.6* 8.4* 8.1* 8.1* 7.8* 7.9*    CBC: Recent Labs  Lab 10/08/21 0531 10/09/21 0510 10/10/21 0506 10/12/21 0245  WBC 10.3 11.7* 12.1* 12.0*  NEUTROABS 6.3  --  7.5 8.2*  HGB 8.3* 8.5* 8.7* 7.9*  HCT 25.4* 26.3* 26.3* 24.9*  MCV 97.3 98.1 98.1 99.6  PLT 227 294 295 352   < > = values in this interval not displayed.    CBG: Recent Labs  Lab 10/06/21 1619  GLUCAP 139*    Brief HPI:   [name] is a 87 y.o. female with history of HTN, chronic low back pain, anemia, recent diagnosis of PAF-on Eliquis, PPM, CKD, PMR-chronic steroids who was admitted on 10/01/2021 after a fall with  subsequent transverse proximal humeral head fracture and right pubic and superior rami fractures.  She was noted to be anemic and hypokalemic at admission.  She was started on stress dose steroids, Eliquis held and received a dose of Lokelma.  She was noted to have leukocytosis with concerns of sepsis on 11/08 and was treated with 5-day course of cefepime.    Dr.  Stann Mainland orthopedics was consulted input and recommended nonoperative management with NWB RUE with sling in place and no range of motion for at least 2 to 3 weeks.  She is to be WBAT RLE with follow-up films in 2 to 3 weeks.  Hospital course was significant for brief runs of asymptomatic VT as well as acute on chronic anemia requiring 1 unit PRBC.  She required Foley placement due to urinary retention.  She was noted to have deficits in mobility and difficulty with ADL tasks due to fatigue, weakness and pain.  CIR was recommended due to functional decline.   Hospital Course: [name] was admitted to rehab 10/06/2021 for inpatient therapies to consist of PT, ST and OT at least three hours five days a week. Past admission physiatrist, therapy team and rehab RN have worked together to provide customized collaborative inpatient rehab.  Therapy evaluations done and patient was noted to require mod assist with mobility and total assist with ADL tasks.  She was limited significantly by pain therefore hydrocodone was scheduled on 3 times daily basis.  She is also had issues with nausea and constipation therefore KUB done showing dilated loops and she was briefly placed on liquid diet as well as IV fluids for hydration.  As constipation resolved diet was advanced to regular and she was tolerating this without difficulty but continued to have bouts of nausea. Foley was kept in place till mobility improved.   She has also had issues with hypotension therefore robaxin was decreased to 250 mg qid.  As renal status improved IVF were decreased to HS. Serial check of labs showed rise in potassium levels again and she received additional dose of Lokelma on 11/16.  UA done due to leucocytosis and UCS was negative. She reported chocking over food during lunch with resulting bolus sensation and difficulty swallowing afterwards. On exam was regurgitation liquids and Dr. Paulita Fujita was consulted for input. On exam, she was able to  drink liquids without difficulty therefore was started on Liquid diet X 24 hours with recommendations to advance in tolerated. On early am of 11/17, she developed bloody stools with clots and repeat CBC showed drop in Hgb to 6.2. Lovenox was discontinued.  GI reconsulted and order written for transfusion of 2 units PRBC.  Dr. Harvest Forest was consulted to transfer patient to acute service for closer monitoring and patient was discharged to progressive care on 10/12/21.   Medications at discharge:  Allopurinol 100 mg po daily Amlodipine 2.5 mg po daily Lipitor 40 mg daily Coreg 6.25 mg po BID Levothyroxine 75 mcg po daily. Methylprednisolone 4 mg po daily Oxycodone 5 mg po bid Protonix 40 mg po daily Mirlax 17 gram po daily Senna S 2 tabs po BID. Ensure max dilay  Disposition:  Acute hospital    Signed: Bary Leriche 10/13/2021, 11:54 AM

## 2021-10-13 NOTE — Evaluation (Signed)
Physical Therapy Evaluation Patient Details Name: [name] MRN: [id number] DOB: [birth year] Today's Date: 10/13/2021  History of Present Illness  87 y/o female from [place] on 11/17 for GI bleed. Recently admitted after a fall with right humeral neck and greater tuberosity and right pubic ramus fx. Significant PMH: aneurysm, basal cell carcinoma, HTN, left heart failure, osteoporosis, pacemaker, polymyalgia, and compression fractures.  Clinical Impression  Patient admitted with above diagnosis. Patient presents with generalized weakness, impaired balance, decreased activity tolerance, and impaired functional mobility. Patient requires minA for bed mobility, min-modA for sit to stand transfer, and minA+2 for step pivot transfer. Patient fatigues quickly with transfers. Patient with moderate amount of blood/clots found in depends with mobility. Patient will benefit from skilled PT services during acute stay to address listed deficits. Recommend return to CIR to continue working towards maximizing functional independence and safety.        Recommendations for follow up therapy are one component of a multi-disciplinary discharge planning process, led by the attending physician.  Recommendations may be updated based on patient status, additional functional criteria and insurance authorization.  Follow Up Recommendations Acute inpatient rehab (3hours/day)    Assistance Recommended at Discharge Frequent or constant Supervision/Assistance  Functional Status Assessment Patient has had a recent decline in their functional status and demonstrates the ability to make significant improvements in function in a reasonable and predictable amount of time.  Equipment Recommendations  BSC/3in1;Wheelchair (measurements PT);Wheelchair cushion (measurements PT)    Recommendations for Other Services Rehab consult     Precautions / Restrictions Precautions Precautions: Fall Precaution Comments: Significant  bruising to face/neck R>L, High fall risk Required Braces or Orthoses: Sling Restrictions Weight Bearing Restrictions: Yes RUE Weight Bearing: Non weight bearing RLE Weight Bearing: Weight bearing as tolerated LLE Weight Bearing: Weight bearing as tolerated Other Position/Activity Restrictions: Sling at all times except for during ADLs; keep elbow close to body when not in sling. No PROM/AROM at R shoulder for 2-3 weeks      Mobility  Bed Mobility Overal bed mobility: Needs Assistance Bed Mobility: Supine to Sit;Sit to Supine     Supine to sit: Min assist Sit to supine: Max assist   General bed mobility comments: minA to come to EOB with assist for trunk elevation    Transfers Overall transfer level: Needs assistance Equipment used: 1 person hand held assist Transfers: Sit to/from Stand;Bed to chair/wheelchair/BSC Sit to Stand: Min assist;Mod assist   Step pivot transfers: Min assist;+2 safety/equipment       General transfer comment: min-modA for sit to stand transfer from EOB and BSC x 3 during session. MinA+2 for step pivot BSC<>bed due to unsteadiness. Fatigues quickly with standing    Ambulation/Gait                  Stairs            Wheelchair Mobility    Modified Rankin (Stroke Patients Only)       Balance Overall balance assessment: Needs assistance Sitting-balance support: Feet supported Sitting balance-Leahy Scale: Fair     Standing balance support: Single extremity supported;During functional activity Standing balance-Leahy Scale: Poor                               Pertinent Vitals/Pain Pain Assessment: Faces Faces Pain Scale: Hurts even more Pain Location: back, legs Pain Descriptors / Indicators: Discomfort;Grimacing;Guarding Pain Intervention(s): Monitored during session;Repositioned    Home Living  Family/patient expects to be discharged to:: Inpatient rehab                        Prior Function  Prior Level of Function : Needs assist       Physical Assist : ADLs (physical);Mobility (physical) Mobility (physical): Transfers;Bed mobility;Gait ADLs (physical): Bathing;Dressing;Grooming;Toileting Mobility Comments: in CIR, requires mod-maxA for mobility ADLs Comments: requires maxA for ADLs at CIR     Hand Dominance        Extremity/Trunk Assessment   Upper Extremity Assessment Upper Extremity Assessment: Defer to OT evaluation    Lower Extremity Assessment Lower Extremity Assessment: Generalized weakness (fragile skin)    Cervical / Trunk Assessment Cervical / Trunk Assessment: Kyphotic  Communication   Communication: No difficulties  Cognition Arousal/Alertness: Awake/alert Behavior During Therapy: WFL for tasks assessed/performed Overall Cognitive Status: Within Functional Limits for tasks assessed                                          General Comments      Exercises     Assessment/Plan    PT Assessment Patient needs continued PT services  PT Problem List Decreased strength;Decreased activity tolerance;Decreased range of motion;Decreased mobility;Decreased balance;Decreased knowledge of precautions;Pain;Decreased skin integrity       PT Treatment Interventions DME instruction;Gait training;Functional mobility training;Therapeutic activities;Therapeutic exercise;Balance training;Patient/family education    PT Goals (Current goals can be found in the Care Plan section)  Acute Rehab PT Goals Patient Stated Goal: less pain PT Goal Formulation: With patient Time For Goal Achievement: 10/17/21 Potential to Achieve Goals: Good    Frequency Min 3X/week   Barriers to discharge        Co-evaluation               AM-PAC PT "6 Clicks" Mobility  Outcome Measure Help needed turning from your back to your side while in a flat bed without using bedrails?: A Little Help needed moving from lying on your back to sitting on the side of  a flat bed without using bedrails?: A Little Help needed moving to and from a bed to a chair (including a wheelchair)?: Total Help needed standing up from a chair using your arms (e.g., wheelchair or bedside chair)?: Total Help needed to walk in hospital room?: Total Help needed climbing 3-5 steps with a railing? : Total 6 Click Score: 10    End of Session Equipment Utilized During Treatment: Other (comment) (sling) Activity Tolerance: Patient limited by fatigue Patient left: in bed;with call bell/phone within reach;with bed alarm set;with family/visitor present Nurse Communication: Mobility status PT Visit Diagnosis: Unsteadiness on feet (R26.81);Muscle weakness (generalized) (M62.81);Difficulty in walking, not elsewhere classified (R26.2);Pain Pain - Right/Left: Right Pain - part of body: Shoulder;Leg    Time: 1441-1511 PT Time Calculation (min) (ACUTE ONLY): 30 min   Charges:   PT Evaluation $PT Eval Moderate Complexity: 1 Mod          Delanee Xin A. Gilford Rile PT, DPT Acute Rehabilitation Services Pager [phone] Office [phone]   Linna Hoff 10/13/2021, 4:10 PM

## 2021-10-13 NOTE — PMR Pre-admission (Addendum)
PMR Admission Coordinator Pre-Admission Assessment  Patient: [name] is an 87 y.o., female MRN: [id number] DOB: 3/2/1933Height:   Weight: 49.8 kg  Insurance Information HMO:     PPO:      PCP:      IPA:      80/20:      OTHER:  PRIMARY: Medicare a and b      Policy#: [id number]      Subscriber: pt Benefits:  Phone #: passport one source online     Name: 11/18 Eff. Date: a 01/25/1999 and b 07/28/1999     Deduct: $1556      Out of Pocket Max: none      Life Max: none CIR: 100%      SNF: 20 full days Outpatient: 80%     Co-Pay: 20% Home Health: 100%      Co-Pay: none DME: 80%     Co-Pay: 20% Providers: pt choice  SECONDARY: [employer]      Policy#: [id number]  Financial Counselor:       Phone#:   The "Data Collection Information Summary" for patients in Inpatient Rehabilitation Facilities with attached "Privacy Act Statement-Health Care Records" was provided and verbally reviewed with: Patient and Family  Emergency Contact Information Contact Information     Name Relation Home Work Mobile   Smith,[name] Daughter [phone]  [phone]   Edwards,Dee Daughter [phone]        Current Medical History  Patient Admitting Diagnosis: polytrauma, GI Bleed  History of Present Illness: 87 year old female with medical history significant of PAF on Eliquis started recently, nonischemic cardiomyopathy, mitral stenosis post repair, SSS status post PPM HTN, CKD stage III, chronic iron deficient anemia, polymyalgia rheumatica on chronic steroids, chronic back pain on narcotics and sees Dr Bartko who presented on 10/01/2021 with question of syncope/near syncope and fall. Fell at home while setting up home decorations .  Imaging showed transverse fracture of proximal humeral neck and fracture greater tuberosity. Sent home with outpatient follow up with ortho., She was re scanned and found to have a right pubic ramus as well as superior pubic ramus fracture. K was 5.8 and  Hgb 6.8. Anemia felt due to multiple hematomas including right forehead, right arm FX, pelvic FX and right leg wound. Transfused and to monitor. Eliquis on hold. Started on heparin for DVT prophylaxis. Chronic diastolic CHF and is euvolemic. Lasix on hold. Leukocytosis with pro calcitonin elevated. Patient empirically started on Cefepime. CXR negative, UA clear and blood cultures penidng. Creat up to 1.96 with Lasix held. HTN and Coreg restarted at lower dose and amlodipine. Continue to hole ACE inhibitor. Urinary retention so Foley placed. Right calf skin ulceration with WOC consulted. PAF, Coreg low dose started. Hypothyroidism on Synthroid. Polymyalgia rheumatica on Medrol as stress does.  Pt. Was admitted to CIR on 10/06/2021. While at CIR initially noted complaints of nausea and constipation, but constipation improved with bowel regimen and KUB'S showed no sign of obstruction. Nausea treated with antiemetics. ON 11/14 she was noted to have some soft BPS. Some swallowing difficulties and GI was consulted. No food impaction felt but then she began passing blood clots in her stool. She was transferred to acute hospital on 10/12/21 with a Hgb drop to 6.2. She had been receiving lovenox, but Eliquis had been on hold. GI felt conservative treatment due to her deconditioning and comorbid conditions in absence of life threatening GI hemorrhage. Transfuse and monitor Hgb. Palliative consulted for goals of   care for not a candidate for anticoagulation. Pt. With continued functional deficits and therapy recommending return to CIR.     Patient's medical record from [facility] has been reviewed by the rehabilitation admission coordinator and physician.  Past Medical History  Past Medical History:  Diagnosis Date   Allergic rhinitis    Anemia    iron deficient   Aneurysm (HCC)    Anxiety    Basal cell carcinoma    Chronic anemia    Chronic low back pain    Colon polyps    GERD (gastroesophageal  reflux disease)    Hiatal hernia    HTN (hypertension)    Hypercholesteremia    Hyperlipidemia    Hypothyroidism    Insomnia    Left heart failure (HCC)    Osteoporosis    S/P forteo treatment as of 02/2009   Pacemaker    Polymyalgia (HCC)    rheumatica-steroids per Rheum   Systolic heart failure    Has the patient had major surgery during 100 days prior to admission? No  Family History   family history includes Breast cancer in her sister; Cancer (age of onset: 33) in her father; Colon cancer in her brother and sister; Stroke in her mother.  Current Medications  Current Facility-Administered Medications:    acetaminophen (TYLENOL) tablet 650 mg, 650 mg, Oral, Q6H PRN, 650 mg at 10/13/21 0616 **OR** acetaminophen (TYLENOL) suppository 650 mg, 650 mg, Rectal, Q6H PRN, Smith, Rondell A, MD   albuterol (PROVENTIL) (2.5 MG/3ML) 0.083% nebulizer solution 2.5 mg, 2.5 mg, Nebulization, Q6H PRN, Smith, Rondell A, MD   amLODipine (NORVASC) tablet 5 mg, 5 mg, Oral, Daily, Feliz Ortiz, Abraham, MD, 5 mg at 10/18/21 0808   atorvastatin (LIPITOR) tablet 40 mg, 40 mg, Oral, Daily, Smith, Rondell A, MD, 40 mg at 10/18/21 0807   carvedilol (COREG) tablet 6.25 mg, 6.25 mg, Oral, BID WC, Feliz Ortiz, Abraham, MD, 6.25 mg at 10/18/21 0807   Chlorhexidine Gluconate Cloth 2 % PADS 6 each, 6 each, Topical, Daily, Feliz Ortiz, Abraham, MD, 6 each at 10/17/21 1222   hydrALAZINE (APRESOLINE) tablet 25 mg, 25 mg, Oral, Q6H PRN, Smith, Rondell A, MD, 25 mg at 10/15/21 0816   HYDROcodone-acetaminophen (NORCO) 10-325 MG per tablet 1 tablet, 1 tablet, Oral, TID AC, Golding, Elizabeth L, DO, 1 tablet at 10/18/21 0805   levothyroxine (SYNTHROID) tablet 75 mcg, 75 mcg, Oral, Daily, Smith, Rondell A, MD, 75 mcg at 10/18/21 0514   lisinopril (ZESTRIL) tablet 10 mg, 10 mg, Oral, Daily, Feliz Ortiz, Abraham, MD, 10 mg at 10/18/21 0807   melatonin tablet 5 mg, 5 mg, Oral, QHS PRN, Smith, Rondell A, MD, 5 mg at 10/17/21  2140   methylPREDNISolone (MEDROL) tablet 4 mg, 4 mg, Oral, Daily, Smith, Rondell A, MD, 4 mg at 10/18/21 0806   ondansetron (ZOFRAN) tablet 4 mg, 4 mg, Oral, Q6H PRN, 4 mg at 10/16/21 1447 **OR** ondansetron (ZOFRAN) injection 4 mg, 4 mg, Intravenous, Q6H PRN, Smith, Rondell A, MD   ondansetron (ZOFRAN) tablet 4 mg, 4 mg, Oral, Q12H, Golding, Elizabeth L, DO, 4 mg at 10/18/21 0806   oxyCODONE (Oxy IR/ROXICODONE) immediate release tablet 5 mg, 5 mg, Oral, Q6H PRN, Smith, Rondell A, MD, 5 mg at 10/17/21 2140   pantoprazole (PROTONIX) EC tablet 40 mg, 40 mg, Oral, Daily, Smith, Rondell A, MD, 40 mg at 10/18/21 0806   simethicone (MYLICON) chewable tablet 80 mg, 80 mg, Oral, QID PRN, Feliz Ortiz, Abraham, MD    Patients Current Diet:  Diet Order             Diet regular Room service appropriate? Yes; Fluid consistency: Thin  Diet effective now                  Precautions / Restrictions Precautions Precautions: Fall Precaution Comments: Significant bruising to face/neck R>L, High fall risk Restrictions Weight Bearing Restrictions: Yes RUE Weight Bearing: Non weight bearing RLE Weight Bearing: Weight bearing as tolerated LLE Weight Bearing: Weight bearing as tolerated Other Position/Activity Restrictions: Sling at all times except for during ADLs; keep elbow close to body when not in sling. No PROM/AROM at R shoulder for 2-3 weeks   Has the patient had 2 or more falls or a fall with injury in the past year? Yes  Prior Activity Level Community (5-7x/wk): Independent and living alone; drove short disctances  Prior Functional Level Self Care: Did the patient need help bathing, dressing, using the toilet or eating? Independent  Indoor Mobility: Did the patient need assistance with walking from room to room (with or without device)? Independent  Stairs: Did the patient need assistance with internal or external stairs (with or without device)? Independent  Functional Cognition: Did the  patient need help planning regular tasks such as shopping or remembering to take medications? Independent  Patient Information Are you of Hispanic, Latino/a,or Spanish origin?: A. No, not of Hispanic, Latino/a, or Spanish origin What is your race?: A. White Do you need or want an interpreter to communicate with a doctor or health care staff?: 0. No  Patient's Response To:  Health Literacy and Transportation Is the patient able to respond to health literacy and transportation needs?: Yes Health Literacy - How often do you need to have someone help you when you read instructions, pamphlets, or other written material from your doctor or pharmacy?: Never In the past 12 months, has lack of transportation kept you from medical appointments or from getting medications?: No In the past 12 months, has lack of transportation kept you from meetings, work, or from getting things needed for daily living?: No  Home Assistive Devices / Equipment Home Assistive Devices/Equipment: Eyeglasses Home Equipment: Rolling Walker (2 wheels)  Prior Device Use: Indicate devices/aids used by the patient prior to current illness, exacerbation or injury? Walker  Current Functional Level Cognition  Arousal/Alertness: Awake/alert Overall Cognitive Status: Within Functional Limits for tasks assessed Orientation Level: Oriented X4 General Comments: Moments with fear of falling but benefits from calming cues. Very motivated    Extremity Assessment (includes Sensation/Coordination)  Upper Extremity Assessment: RUE deficits/detail RUE Deficits / Details: No shoulder ROM. cleared for AROM of hand, wrist, and elbow. Wear sling unless performing ADLs; keep elbow close to body when sling off.  k-tape on the dorsal portion of her R hand and along each finger looks intact and no redness noted. RUE Sensation: WNL RUE Coordination: decreased gross motor LUE Deficits / Details: Noting edema at L forearm  Lower Extremity  Assessment: Defer to PT evaluation RLE Deficits / Details: Increased edema, pt able to perform LAQ, ankle dorsiflexion 3/5 LLE Deficits / Details: Increased edema. Pt able to perform limited heel slide and SLR. Ankle dorsiflexion 2/5    ADLs  Overall ADL's : Needs assistance/impaired Eating/Feeding: Minimal assistance, Sitting Grooming: Minimal assistance, Sitting Upper Body Bathing: Moderate assistance, Sitting Upper Body Bathing Details (indicate cue type and reason): Providing cues and education for compensatory techniques. Pt able to wash under R axillary area with assistance to   maintain limited shoulder movement. Pt washing L axillary area by reaching LUE under as cued. Assist for washing her back. Lower Body Bathing: Maximal assistance, Sit to/from stand Lower Body Bathing Details (indicate cue type and reason): Pt washing anterior peri area while seated and leaning back. Assistance to wash posterior peri area while in standing. Cues for weight shifting forward to toes to counter act posterior lean. Min A for power up into standing Upper Body Dressing : Maximal assistance, Sitting Upper Body Dressing Details (indicate cue type and reason): Max A for donning new gown and donning sling. Lower Body Dressing: Maximal assistance Lower Body Dressing Details (indicate cue type and reason): donned socks Toilet Transfer: Moderate assistance, Stand-pivot (simulated to recliner) Toilet Transfer Details (indicate cue type and reason): Mod A for balance and posterior lean Toileting- Clothing Manipulation and Hygiene: Maximal assistance, +2 for physical assistance, Sit to/from stand Toileting - Clothing Manipulation Details (indicate cue type and reason): Max A for peri care. Pt with moderate blood in stool. Second person to maintain balance Functional mobility during ADLs: Moderate assistance (stand pivot only) General ADL Comments: Pt performing UB bathing, LB bathing, and dressing. Stand pivot to  recliner. Providing cues for weight shift forward in standing - and pt demonstrating improved balance with good rehab potentional. Fatigued at end of session but still motivated to practice with PT    Mobility  Overal bed mobility: Needs Assistance Bed Mobility: Supine to Sit Supine to sit: Min assist Sit to supine: Max assist General bed mobility comments: Pt is OOB in the recliner chair.    Transfers  Overall transfer level: Needs assistance Equipment used: 1 person hand held assist Transfers: Sit to/from Stand Sit to Stand: Mod assist, +2 physical assistance Bed to/from chair/wheelchair/BSC transfer type:: Step pivot Step pivot transfers: Mod assist, +2 physical assistance General transfer comment: Two person mod assist to take steps from recliner to [facility] and back to recliner, wide BOS and painful stepping over R LE (d/t pelvic fx).    Ambulation / Gait / Stairs / Wheelchair Mobility  Ambulation/Gait General Gait Details: pt too fatigued after toileting for attempts at gait.    Posture / Balance Dynamic Sitting Balance Sitting balance - Comments: needs L UE support to maintain sitting in recliner with back off of backrest Balance Overall balance assessment: Needs assistance Sitting-balance support: Feet supported, Single extremity supported Sitting balance-Leahy Scale: Poor Sitting balance - Comments: needs L UE support to maintain sitting in recliner with back off of backrest Postural control: Posterior lean Standing balance support: Single extremity supported Standing balance-Leahy Scale: Poor Standing balance comment: does better with one hand supported on something solid like bed rail or back of locked chair.  May trial hemi walker next session.  She is just so painful when attempting to WB on her right foot.    Special needs/care consideration Palliative consulted for [place] on 11/18   Previous Home Environment  Living Arrangements: Alone  Lives With: Alone Available Help  at Discharge: Family, Available 24 hours/day Type of Home: House Home Layout: One level Home Access: Stairs to enter Entrance Stairs-Rails: Right Entrance Stairs-Number of Steps: 2 Bathroom Shower/Tub: Multimedia programmer: Standard Bathroom Accessibility: Yes How Accessible: Accessible via walker [place]: No  Discharge Living Setting Plans for Discharge Living Setting: Lives with (comment) (to live with daughter, [name]) Type of Home at Discharge: House Discharge Home Layout: One level Discharge Home Access: Stairs to enter Entrance Stairs-Rails: Right, Left, Can reach both  Entrance Stairs-Number of Steps: 3 to 4 Discharge Bathroom Shower/Tub: Tub/shower unit Discharge Bathroom Toilet: Standard Discharge Bathroom Accessibility: Yes How Accessible: Accessible via walker Does the patient have any problems obtaining your medications?: No  Social/Family/Support Systems Patient Roles: Parent Contact Information: daughter Dee and daughter, [name] Anticipated Caregiver: [name] (dtr) Anticipated Caregiver's Contact Information: see contacts Ability/Limitations of Caregiver: Pt dtr [name] will be primary caregiver. [name] is currenlty using a walker due to knee issues.  Dee will help when able as she works during the day. Caregiver Availability: 24/7 Discharge Plan Discussed with Primary Caregiver: Yes Is Caregiver In Agreement with Plan?: Yes Does Caregiver/Family have Issues with Lodging/Transportation while Pt is in Rehab?: No  [name] only uses walker in the community as needed. In home she does not.  Goals Patient/Family Goal for Rehab: supervision to min assist with PT and OT Expected length of stay: ELOS 10 to 12 days Pt/Family Agrees to Admission and willing to participate: Yes Program Orientation Provided & Reviewed with Pt/Caregiver Including Roles  & Responsibilities: Yes  Decrease burden of Care through IP rehab admission: n/a  Possible need  for SNF placement upon discharge: not anticipated  Patient Condition: I have reviewed medical records from [facility], spoken with CM, and patient and daughter. I met with patient at the bedside for inpatient rehabilitation assessment.  Patient will benefit from ongoing PT and OT, can actively participate in 3 hours of therapy a day 5 days of the week, and can make measurable gains during the admission.  Patient will also benefit from the coordinated team approach during an Inpatient Acute Rehabilitation admission.  The patient will receive intensive therapy as well as Rehabilitation physician, nursing, social worker, and care management interventions.  Due to bladder management, bowel management, safety, skin/wound care, disease management, medication administration, pain management, and patient education the patient requires 24 hour a day rehabilitation nursing.  The patient is currently Mod A with mobility and basic ADLs.  Discharge setting and therapy post discharge at home with home health is anticipated.  Patient has agreed to participate in the Acute Inpatient Rehabilitation Program and will admit today.  Preadmission Screen Completed By:  Barbara Boyette RN MSN with updates by Laura B Staley, 10/18/2021 11:20 AM with updates by Laura Staley, MS, CCC-SLP  ______________________________________________________________________   Discussed status with Dr. Kirsteins  on 10/18/21  at 1000 and received approval for admission today.  Admission Coordinator: Barbara Boyette RN MSN with updates by Laura B Staley, CCC-SLP, time 1120 /Date 10/18/21   Assessment/Plan: Diagnosis:Rigth proximal Humeral fracture, multiple pelvic fracture Does the need for close, 24 hr/day Medical supervision in concert with the patient's rehab needs make it unreasonable for this patient to be served in a less intensive setting? Yes Co-Morbidities requiring supervision/potential complications: PAF on Eliquis, SSS s/p  PPM, CKD3 Due to bladder management, bowel management, safety, skin/wound care, disease management, medication administration, pain management, and patient education, does the patient require 24 hr/day rehab nursing? Yes Does the patient require coordinated care of a physician, rehab nurse, PT, OT, and SLP to address physical and functional deficits in the context of the above medical diagnosis(es)? Yes Addressing deficits in the following areas: balance, endurance, locomotion, strength, transferring, bowel/bladder control, bathing, dressing, feeding, grooming, toileting, cognition, and psychosocial support Can the patient actively participate in an intensive therapy program of at least 3 hrs of therapy 5 days a week? Yes The potential for patient to make measurable gains while on inpatient rehab is good   Anticipated functional outcomes upon discharge from inpatient rehab: supervision PT, supervision OT, supervision SLP Estimated rehab length of stay to reach the above functional goals is: 10-12d Anticipated discharge destination: Home 10. Overall Rehab/Functional Prognosis: good   MD Signature: Andrew E. Kirsteins M.D. [facility] Fellow Am Acad of Phys Med and Rehab Diplomate Am Board of Electrodiagnostic Med Fellow Am Board of Interventional Pain  

## 2021-10-13 NOTE — Consult Note (Signed)
Palliative Care Consultation Note  [name] is an 87 yo woman with polymyalgia rheumatica on chronic steroids for many years, multi-level chronic degenerative back disease with prior multilevel vertebroplasty for compression fractures and atrial fibrillation w/pacemaker on Eloquis who sustained an accidental fall at home with admission for humerus fracture and pelvic fracture between 11/6-11/11 then discharged to CIR and was re-admitted with acute onset lower GI Bleeding requiring blood transfusion. She also had acute onset swallowing difficulty but has a history of esophageal spasm and similar episodes.  Palliative care consultation was requested for goals of care conversation and acute symptom management.  I met with patient and her daughter [name]. We discussed her current conditions including her pain, anticoagulation and ACP/goals of care.   [name] has three daughters who are very supportive. She lived independently prior to this admission, would still drive very short distances and is mentally very sharp and has no cognitive impairment.  Summary:  Pain Control: She was on hydrocodone 5/325 3-4 times a day prior to admission and was followed by Dr. Assunta Curtis for pain management. She has been on this regimen for years related to her back issues and no recent med changes. She is currently not on scheduled pain medication and under-dosed based on her home regimen. She will do much better with therapy with pain control. Movement is still very painful for her-she will also possibly have prolonged healing on chronic steroids and she needs to be monitored during acute illness for adrenal insufficiency. Start scheduled Hydrocodone 10mg /325 TID Ok to continue oxycodone for breakthrough pain. 2. Nausea: This is somewhat of a chronic issue for her and has been going on well before this admission, she endorses taking OTC nausea medication with regularity. She also has issues with diarrhea and fecal  incontinence-much less issues with constipation. Difficult to know what the etiology of her chronic nausea is but opioids could certainly be a contributor. There could also be a multitude of underlying sub acute issues since she is chronically immune suppressed on steroids. Will schedule zofran BID and see if this helps. Scheduled opioids with meals 3. Esophageal Spasm: This is a chronic issue for her it usually resolves on it own- she says drinking coke helps when this happens. Will advance her diet to regular and advised her to select soft foods and sit up as straight as possible-it could be related to her T-Spine compression deformities as well. Advanced her diet at her request 4. Anticoagulation: The risk of Eloquis likely outweigh the benefits at this point given her fall and also GIB. We discussed stroke risk compared to her other issues currently and she wants to stop Eloquis. 5. Goals of Care She has ACP documents-I have requested copies of those for her chart. Many of her wishes have been informed by the experience she had with her husband who had a bad stroke several years ago and spent the last year of his life in different SNFs that were very poor quality of care. She is adamant that she not be put into a SNF facility and her daughters are aware of this wish. Her hope is to recover as much as possible from this fall and regain her mobility and QOL. Plan is to go to CIR and discharge to her daughters home which is one level and she will have good support. We discussed her CODE STATUS and that CPR under any circumstances would not be advised and not achieve the desired outcome given her current frail status  and T-Spine hx. She and her daughter are going to discuss with family and let me know next week. I will check back in on Monday.  Lane Hacker, DO Palliative Medicine   Time: 70 minutes Greater than 50%  of this time was spent counseling and coordinating care related to the above  assessment and plan.

## 2021-10-13 NOTE — Care Management Important Message (Signed)
Important Message  Patient Details  Name: [name] MRN: [id number] Date of Birth: 12/14/1931   Medicare Important Message Given:  Yes     [name] 10/13/2021, 2:22 PM

## 2021-10-13 NOTE — Progress Notes (Signed)
[facility] Progress Note  [name] 87 y.o. [birth year]  CC:  Acute lower GI bleeding   Subjective: Patient states she has not seen any more blood since yesterday. No bowel movements since yesterday. Last blood noted yesterday at 8am. She is tolerating her diet well. Denies abdominal pain, nausea, vomiting.  ROS : Review of Systems  Gastrointestinal:  Positive for blood in stool. Negative for abdominal pain, constipation, diarrhea, heartburn, melena, nausea and vomiting.  Genitourinary:  Negative for dysuria and urgency.    Objective: Vital signs in last 24 hours: Vitals:   10/13/21 0304 10/13/21 0810  BP: (!) 142/57 (!) 139/53  Pulse:  89  Resp: 17 13  Temp: 97.9 F (36.6 C) 97.9 F (36.6 C)  SpO2:  93%    Physical Exam:  General:  Alert, cooperative, no distress, appears stated age  Head:  Normocephalic, without obvious abnormality, atraumatic. Significant ecchymosis around face.  Eyes:  Anicteric sclera, EOM's intact, conjunctival pallor  Lungs:   Clear to auscultation bilaterally, respirations unlabored  Heart:  Regular rate and rhythm, S1, S2 normal  Abdomen:   Soft, non-tender, bowel sounds active all four quadrants,  no masses,     Lab Results: Recent Labs    10/12/21 1624 10/13/21 0617  NA 134* 136  K 5.0 4.8  CL 109 111  CO2 18* 19*  GLUCOSE 196* 77  BUN 41* 32*  CREATININE 1.20* 1.01*  CALCIUM 7.4* 7.5*   Recent Labs    10/12/21 0245 10/13/21 0617  AST 55* 52*  ALT 66* 54*  ALKPHOS 89 77  BILITOT 0.9 0.8  PROT 4.0* 4.3*  ALBUMIN 1.9* 2.0*   Recent Labs    10/12/21 0245 10/12/21 0506 10/13/21 0319 10/13/21 0617  WBC 12.0*  --   --   --   NEUTROABS 8.2*  --   --   --   HGB 7.9*   < > 9.3* 9.3*  HCT 24.9*   < > 27.6* 28.3*  MCV 99.6  --   --   --   PLT 352  --   --   --    < > = values in this interval not displayed.   Recent Labs    10/12/21 0245  LABPROT 13.6  INR 1.0      Assessment Acute lower GI  bleeding -HGB 9.3 (1 unit pRBCs 11/17) - CT Abdomen pelvis without contrast 11/17: no obstruction. Colon is normal. Liver, pancreas, and gallbladder unremarkable.  Hyperkalemia - Potassium 4.8 (improved from 5.7)   Humerus and pelvic fracture   Transaminitis - AST 52, ALT 54 (trending down) - Continue to monitor  Plan: Patient has not had anymore bleeding, hgb improved after 1 unit pRBCs Continue to monitor, Continue daily CBC and transfuse as needed to maintain HGB > 7  Patient is not a candidate for colonoscopy at this time, if bleeding recurs can consider tagged RBC study vs CT angiogram. [facility] will follow at a distance.  Madelene Kaatz Radford Pax PA-C 10/13/2021, 8:20 AM  Contact #  [phone]

## 2021-10-13 NOTE — Progress Notes (Signed)
[facility] PROGRESS NOTE    Progress Note  [name]  VQQ:[id number] DOB: 07/16/1932 DOA: 10/12/2021 PCP: Lawerance Cruel, MD     Brief Narrative:   [name] is an 87 y.o. female past medical history of essential hypertension, paroxysmal atrial fibrillation on Eliquis nonischemic cardiomyopathy sick sinus syndrome status post pacemaker, mitral stenosis repair hypothyroidism chronic kidney disease stage IIIa and iron deficiency anemia PE and are on chronic steroids recently discharged from the hospital on 10/06/2021 after sustaining a transverse proximal humeral head fracture and right pubic fracture along with a superior pubic rami fracture was brought into the hospital from rehab after the notice of nausea and constipation.  Labs were done that showed hyperkalemia and worsening renal function which initially improved with IV fluid, subsequently she started choking on her food and regurgitating applesauce. She started developing abdominal pain and passing clots, her hemoglobin was 6.2   Assessment/Plan:   Acute lower GI bleeding/acute blood loss anemia: With a drop in hemoglobin of 6.2.  Eliquis has been held. Type and screen and getting 1 units of packed red blood cells her hemoglobin improved to greater than 9 Gastroenterology was consulted, they relate that colonoscopy may represent more risk than benefit due to her current medical state If patient rebleeds may consider red blood cell tag scan versus a CT angio. CT of the abdomen and pelvis showed no acute findings. Will consult palliative care to discuss with family end-of-life she is probably not a candidate for anticoagulation.  Leukocytosis: Likely reactive in nature has remained afebrile.  Hypokalemia: She was given doses of IV Lasix, and her potassium has improved nicely.  Is 4.8.  Dysphagia: She is at high risk of aspiration on last admission speech was consulted recommended a dysphagia 2 with thin liquid  diet.  Chronic kidney disease stage IIIb: With a baseline creatinine around 1.2, discontinue Foley catheter  Monitor strict I's and O's. Creatinine remains at baseline.  Essential hypertension: All antihypertensive medications were held at skilled due to borderline low blood pressure. This time her blood pressure slowly trending up. Start him back slowly.  Paroxysmal atrial fibrillation: Continue to hold anticoagulation due to GI bleed. She may not be a candidate for anticoagulation in the future.  Chronic diastolic heart failure: Appears euvolemic continue strict I's and O's and daily weights.  Humerus and pelvic fracture secondary to fall: Orthopedic surgery saw her on her admission at the beginning of November recommended nonoperative management. Physical therapy and Occupational Therapy have been consulted continue oxycodone for pain control.   DVT prophylaxis: scd Family Communication:none Status is: Inpatient  Remains inpatient appropriate because: Acute GI bleed        Code Status:     Code Status Orders  (From admission, onward)           Start     Ordered   10/12/21 0735  Full code  Continuous        10/12/21 0735           Code Status History     Date Active Date Inactive Code Status Order ID Comments User Context   10/12/2021 0609 10/12/2021 0735 Full Code [id number]  [name], MD Inpatient   10/06/2021 1811 10/12/2021 0600 Full Code [id number]  Flora Lipps Inpatient   10/01/2021 1540 10/06/2021 1805 Full Code [id number]  Lequita Halt, MD ED   03/28/2019 1809 03/31/2019 1709 Full Code [id number]  Arrien, Jimmy Picket, MD Inpatient  IV Access:   Peripheral IV   Procedures and diagnostic studies:   CT ABDOMEN PELVIS WO CONTRAST  Addendum Date: 10/13/2021   ADDENDUM REPORT: 10/13/2021 00:59 ADDENDUM: It should be noted there is a small right inguinal hernia with a loop of small bowel within although no obstructive  changes are noted. Electronically Signed   By: Inez Catalina M.D.   On: 10/13/2021 00:59   Result Date: 10/13/2021 CLINICAL DATA:  Abdominal distension EXAM: CT ABDOMEN AND PELVIS WITHOUT CONTRAST TECHNIQUE: Multidetector CT imaging of the abdomen and pelvis was performed following the standard protocol without IV contrast. COMPARISON:  Plain film from the previous day FINDINGS: Lower chest: Small bilateral pleural effusions are noted with associated atelectatic changes. No focal confluent infiltrate is seen. Hepatobiliary: No focal liver abnormality is seen. No gallstones, gallbladder wall thickening, or biliary dilatation. Pancreas: Unremarkable. No pancreatic ductal dilatation or surrounding inflammatory changes. Spleen: Normal in size without focal abnormality. Adrenals/Urinary Tract: Adrenal glands are within normal limits. Kidneys are well visualized bilaterally. No renal calculi or urinary tract obstructive changes are seen. Ureters are within normal limits. Bladder is decompressed by Foley catheter. Stomach/Bowel: Colon shows no obstructive or inflammatory changes. Appendix is well visualized and within normal limits. No inflammatory changes are seen. Small bowel and stomach are within normal limits. Vascular/Lymphatic: Aortic atherosclerosis. No enlarged abdominal or pelvic lymph nodes. Reproductive: Status post hysterectomy. No adnexal masses. Other: No abdominal wall hernia or abnormality. No abdominopelvic ascites. Musculoskeletal: Generalized osteopenia is noted. Changes of prior vertebral augmentation are noted at L4, L1 and T10 through T12. IMPRESSION: Small bilateral pleural effusions with associated atelectasis. No obstructive changes are seen. Normal-appearing appendix. Electronically Signed: By: Inez Catalina M.D. On: 10/13/2021 00:53   DG Chest 2 View  Result Date: 10/11/2021 CLINICAL DATA:  Difficulty swallowing. EXAM: CHEST - 2 VIEW COMPARISON:  Chest x-ray 10/01/2021. CT chest abdomen  and pelvis 10/01/2021. FINDINGS: The heart is enlarged. Patient is status post cardiac surgery and valve replacement. Left-sided pacemaker is present. There is no lung consolidation, pleural effusion or pneumothorax. There is some linear scarring in the lower lungs. Vertebroplasty changes are seen at multiple levels. Chronic midthoracic compression fractures are unchanged. IMPRESSION: 1. Cardiomegaly. 2. No acute cardiopulmonary process. Electronically Signed   By: Ronney Asters M.D.   On: 10/11/2021 17:53   DG Abd 1 View  Result Date: 10/12/2021 CLINICAL DATA:  Abdominal pain and diarrhea. EXAM: ABDOMEN - 1 VIEW COMPARISON:  10/08/2021 FINDINGS: Scattered air-filled small bowel and colon possibly reflecting an ileus or gastroenteritis. No free air is identified. Stable vascular calcifications. IMPRESSION: Possible ileus or gastroenteritis. Electronically Signed   By: Marijo Sanes M.D.   On: 10/12/2021 15:02     Medical Consultants:   None.   Subjective:    [name] only one 1 bloody bowel movement overnight. No abdominal pain  Objective:    Vitals:   10/12/21 1921 10/12/21 2304 10/13/21 0304 10/13/21 0449  BP: (!) 148/57 (!) 178/65 (!) 142/57   Pulse:      Resp: 19 14 17    Temp: 98.3 F (36.8 C) 98.2 F (36.8 C) 97.9 F (36.6 C)   TempSrc: Oral Oral Oral   SpO2:      Weight:    51.6 kg   SpO2: 98 %   Intake/Output Summary (Last 24 hours) at 10/13/2021 0729 Last data filed at 10/12/2021 2358 Gross per 24 hour  Intake 630 ml  Output 3000 ml  Net -2370  ml   Filed Weights   10/13/21 0449  Weight: 51.6 kg    Exam: General exam: In no acute distress. Respiratory system: Good air movement and clear to auscultation. Cardiovascular system: S1 & S2 heard, RRR. No JVD. Gastrointestinal system: Abdomen is nondistended, soft and nontender.  Extremities: No pedal edema. Skin: No rashes, lesions or ulcers Psychiatry: Judgement and insight appear normal. Mood & affect  appropriate.    Data Reviewed:    Labs: Basic Metabolic Panel: Recent Labs  Lab 10/11/21 0504 10/12/21 0148 10/12/21 0245 10/12/21 1624 10/13/21 0617  NA 135 133* 134* 134* 136  K 5.7* 5.4* 5.7* 5.0 4.8  CL 107 106 107 109 111  CO2 20* 18* 20* 18* 19*  GLUCOSE 79 94 93 196* 77  BUN 45* 42* 41* 41* 32*  CREATININE 1.49* 1.29* 1.37* 1.20* 1.01*  CALCIUM 8.1* 7.8* 7.9* 7.4* 7.5*   GFR Estimated Creatinine Clearance: 26.3 mL/min (A) (by C-G formula based on SCr of 1.01 mg/dL (H)). Liver Function Tests: Recent Labs  Lab 10/07/21 1434 10/12/21 0245 10/13/21 0617  AST 60* 55* 52*  ALT 59* 66* 54*  ALKPHOS 72 89 77  BILITOT 0.9 0.9 0.8  PROT 5.0* 4.0* 4.3*  ALBUMIN 1.9* 1.9* 2.0*   No results for input(s): LIPASE, AMYLASE in the last 168 hours. No results for input(s): AMMONIA in the last 168 hours. Coagulation profile Recent Labs  Lab 10/12/21 0245  INR 1.0   COVID-19 Labs  No results for input(s): DDIMER, FERRITIN, LDH, CRP in the last 72 hours.  Lab Results  Component Value Date   [place] NEGATIVE 09/30/2021    CBC: Recent Labs  Lab 10/07/21 1434 10/08/21 0531 10/09/21 0510 10/10/21 0506 10/12/21 0245 10/12/21 0506 10/12/21 1233 10/12/21 1810 10/13/21 0319 10/13/21 0617  WBC 10.4 10.3 11.7* 12.1* 12.0*  --   --   --   --   --   NEUTROABS 8.3* 6.3  --  7.5 8.2*  --   --   --   --   --   HGB 8.8* 8.3* 8.5* 8.7* 7.9* 6.2* 10.4* 9.2* 9.3* 9.3*  HCT 26.4* 25.4* 26.3* 26.3* 24.9* 20.1* 31.4* 28.1* 27.6* 28.3*  MCV 97.1 97.3 98.1 98.1 99.6  --   --   --   --   --   PLT 201 227 294 295 352  --   --   --   --   --    Cardiac Enzymes: No results for input(s): CKTOTAL, CKMB, CKMBINDEX, TROPONINI in the last 168 hours. BNP (last 3 results) No results for input(s): PROBNP in the last 8760 hours. CBG: Recent Labs  Lab 10/06/21 0815 10/06/21 1116 10/06/21 1619  GLUCAP 80 112* 139*   D-Dimer: No results for input(s): DDIMER in the last 72  hours. Hgb A1c: No results for input(s): HGBA1C in the last 72 hours. Lipid Profile: No results for input(s): CHOL, HDL, LDLCALC, TRIG, CHOLHDL, LDLDIRECT in the last 72 hours. Thyroid function studies: Recent Labs    10/12/21 1233  TSH 10.145*   Anemia work up: No results for input(s): VITAMINB12, FOLATE, FERRITIN, TIBC, IRON, RETICCTPCT in the last 72 hours. Sepsis Labs: Recent Labs  Lab 10/08/21 0531 10/09/21 0510 10/10/21 0506 10/12/21 0245  WBC 10.3 11.7* 12.1* 12.0*   Microbiology Recent Results (from the past 240 hour(s))  Culture, blood (routine x 2)     Status: None   Collection Time: 10/03/21  7:35 AM   Specimen: BLOOD RIGHT HAND  Result Value Ref Range Status   Specimen Description BLOOD RIGHT HAND  Final   Special Requests   Final    BOTTLES DRAWN AEROBIC AND ANAEROBIC Blood Culture adequate volume   Culture   Final    NO GROWTH 5 DAYS Performed at [facility], [address], [place], [place] [address]    Report Status 10/08/2021 FINAL  Final  Culture, blood (routine x 2)     Status: None   Collection Time: 10/03/21  7:35 AM   Specimen: BLOOD RIGHT ARM  Result Value Ref Range Status   Specimen Description BLOOD RIGHT ARM  Final   Special Requests   Final    BOTTLES DRAWN AEROBIC ONLY Blood Culture results may not be optimal due to an inadequate volume of blood received in culture bottles   Culture   Final    NO GROWTH 5 DAYS Performed at [facility], [place] [address], [place], [place] [address]    Report Status 10/08/2021 FINAL  Final  Urine Culture     Status: Abnormal   Collection Time: 10/03/21 11:17 AM   Specimen: Urine, Clean Catch  Result Value Ref Range Status   Specimen Description URINE, CLEAN CATCH  Final   Special Requests   Final    NONE Performed at [facility], [place] [address], [place], [place] [address]    Culture 30,000 COLONIES/mL ENTEROCOCCUS FAECALIS (A)  Final   Report Status 10/05/2021 FINAL  Final    Organism ID, Bacteria ENTEROCOCCUS FAECALIS (A)  Final      Susceptibility   Enterococcus faecalis - MIC*    AMPICILLIN <=2 SENSITIVE Sensitive     NITROFURANTOIN <=16 SENSITIVE Sensitive     VANCOMYCIN 1 SENSITIVE Sensitive     * 30,000 COLONIES/mL ENTEROCOCCUS FAECALIS  Urine Culture     Status: None   Collection Time: 10/09/21  1:29 PM   Specimen: Urine, Catheterized  Result Value Ref Range Status   Specimen Description URINE, CATHETERIZED  Final   Special Requests NONE  Final   Culture   Final    NO GROWTH Performed at [facility], [place] [address] [place], [place] [address]    Report Status 10/11/2021 FINAL  Final     Medications:    atorvastatin  40 mg Oral Daily   levothyroxine  75 mcg Oral Daily   methylPREDNISolone  4 mg Oral Daily   pantoprazole  40 mg Oral Daily   Continuous Infusions:    LOS: 1 day   Charlynne Cousins  [facility]  10/13/2021, 7:29 AM

## 2021-10-13 NOTE — Progress Notes (Signed)
Inpatient Rehabilitation Admissions Coordinator   I met at bedside with patient. I will follow up Monday and if patient remains medically stable would pursue readmit to Cir. Patient is in agreement.  Danne Baxter, RN, MSN Rehab Admissions Coordinator [phone] 10/13/2021 1:09 PM'

## 2021-10-13 NOTE — Evaluation (Signed)
Occupational Therapy Evaluation Patient Details Name: [name] MRN: [id number] DOB: [birth year] Today's Date: 10/13/2021   History of Present Illness 87 y/o female from [facility] on 11/17 for GI bleed. Recently admitted after a fall with right humeral neck and greater tuberosity and right pubic ramus fx. Significant PMH: aneurysm, basal cell carcinoma, HTN, left heart failure, osteoporosis, pacemaker, polymyalgia, and compression fractures.   Clinical Impression   PTA, pt was at [facility] for rehab and was performing ADLs and functional mobility with therapy staff. Prior to last admission, pt was independent. Pt currently requiring Mod-Max A for UB ADLs, Max A for LB ADLs, and Min-Mod A for functional transfers. Pt with moderate amount of blood in depends; notified RN. Pt continues to present with high motivation to participate in therapy despite fatigue and pain.  Pt would benefit from further acute OT to facilitate safe dc. Recommend dc to CIR for further OT to optimize safety, independence with ADLs, and return to PLOF.      Recommendations for follow up therapy are one component of a multi-disciplinary discharge planning process, led by the attending physician.  Recommendations may be updated based on patient status, additional functional criteria and insurance authorization.   Follow Up Recommendations  Acute inpatient rehab (3hours/day)    Assistance Recommended at Discharge Frequent or constant Supervision/Assistance  Functional Status Assessment  Patient has had a recent decline in their functional status and demonstrates the ability to make significant improvements in function in a reasonable and predictable amount of time.  Equipment Recommendations  Tub/shower seat    Recommendations for Other Services PT consult;Rehab consult     Precautions / Restrictions Precautions Precautions: Fall Precaution Comments: Significant bruising to face/neck R>L, High fall risk Required Braces or  Orthoses: Sling Restrictions Weight Bearing Restrictions: Yes RUE Weight Bearing: Non weight bearing RLE Weight Bearing: Weight bearing as tolerated LLE Weight Bearing: Weight bearing as tolerated Other Position/Activity Restrictions: Sling at all times except for during ADLs; keep elbow close to body when not in sling. No PROM/AROM at R shoulder for 2-3 weeks      Mobility Bed Mobility Overal bed mobility: Needs Assistance Bed Mobility: Supine to Sit;Sit to Supine     Supine to sit: Min assist Sit to supine: Max assist   General bed mobility comments: minA to come to EOB with assist for trunk elevation    Transfers Overall transfer level: Needs assistance Equipment used: 1 person hand held assist Transfers: Sit to/from Stand;Bed to chair/wheelchair/BSC Sit to Stand: Min assist;Mod assist     Step pivot transfers: Min assist;+2 safety/equipment     General transfer comment: min-modA for sit to stand transfer from EOB and BSC x 3 during session. MinA+2 for step pivot BSC<>bed due to unsteadiness. Fatigues quickly with standing      Balance Overall balance assessment: Needs assistance Sitting-balance support: Feet supported Sitting balance-Leahy Scale: Fair     Standing balance support: Single extremity supported;During functional activity Standing balance-Leahy Scale: Poor                             ADL either performed or assessed with clinical judgement   ADL Overall ADL's : Needs assistance/impaired Eating/Feeding: Minimal assistance;Sitting   Grooming: Minimal assistance;Sitting   Upper Body Bathing: Moderate assistance;Sitting   Lower Body Bathing: Maximal assistance;+2 for safety/equipment;Sit to/from stand   Upper Body Dressing : Maximal assistance;Sitting Upper Body Dressing Details (indicate cue type and reason): repositioning  sling Lower Body Dressing: Maximal assistance;+2 for safety/equipment Lower Body Dressing Details (indicate cue  type and reason): donning and repositioning depends Toilet Transfer: Moderate assistance;Maximal assistance;+2 for physical assistance;+2 for safety/equipment;Stand-pivot;BSC/3in1   Toileting- Clothing Manipulation and Hygiene: Maximal assistance;+2 for physical assistance;Sit to/from stand Toileting - Clothing Manipulation Details (indicate cue type and reason): Max A for peri care. Pt with moderate blood in stool. Second person to maintain balance     Functional mobility during ADLs: Maximal assistance;Moderate assistance;+2 for physical assistance (stand pivot only) General ADL Comments: Pt presenting with decreased balance, strength, safety, and cognition.     Vision         Perception     Praxis      Pertinent Vitals/Pain Pain Assessment: Faces Faces Pain Scale: Hurts even more Pain Location: back, legs Pain Descriptors / Indicators: Discomfort;Grimacing;Guarding Pain Intervention(s): Monitored during session;Limited activity within patient's tolerance;Repositioned     Hand Dominance Right   Extremity/Trunk Assessment Upper Extremity Assessment Upper Extremity Assessment: RUE deficits/detail;LUE deficits/detail RUE Deficits / Details: No shoulder ROM. cleared for AROM of hand, wrist, and elbow. Wear sling unless performing ADLs. Also noting pt with k-tape on the dorsal portion of her R hand and along each finger; assume this was placed by OT at [facility]: decreased gross motor LUE Deficits / Details: Noting edema at L forearm   Lower Extremity Assessment Lower Extremity Assessment: Defer to PT evaluation   Cervical / Trunk Assessment Cervical / Trunk Assessment: Kyphotic   Communication Communication Communication: No difficulties   Cognition Arousal/Alertness: Awake/alert Behavior During Therapy: WFL for tasks assessed/performed Overall Cognitive Status: Within Functional Limits for tasks assessed                                 General  Comments: Moments with fear of falling but benefits from calming cues     General Comments  Daughter present throughout    Exercises     Shoulder Instructions      Home Living Family/patient expects to be discharged to:: Inpatient rehab Living Arrangements: Alone Available Help at Discharge: Family;Available 24 hours/day Type of Home: House Home Access: Stairs to enter [employer] of Steps: 2 Entrance Stairs-Rails: Right Home Layout: One level     Bathroom Shower/Tub: Occupational psychologist: Standard Bathroom Accessibility: Yes How Accessible: Accessible via walker Home Equipment: [place] (2 wheels)          Prior Functioning/Environment Prior Level of Function : Needs assist       Physical Assist : ADLs (physical);Mobility (physical) Mobility (physical): Transfers;Bed mobility;Gait ADLs (physical): Bathing;Dressing;Grooming;Toileting Mobility Comments: Prior to last admission, pt was independent with mobility and not using any AD. At [facility], requires mod-maxA for mobility. ADLs Comments: Prior to last admission, pt was independent and driving. At [facility], requires maxA for ADLs        OT Problem List: Decreased strength;Decreased range of motion;Decreased activity tolerance;Impaired balance (sitting and/or standing);Decreased coordination;Decreased safety awareness;Decreased knowledge of use of DME or AE;Decreased knowledge of precautions;Impaired UE functional use;Pain;Increased edema      OT Treatment/Interventions: Self-care/ADL training;Therapeutic exercise;Balance training;Patient/family education;DME and/or AE instruction    OT Goals(Current goals can be found in the care plan section) Acute Rehab OT Goals Patient Stated Goal: Return to rehab OT Goal Formulation: With patient Time For Goal Achievement: 10/27/21 Potential to Achieve Goals: Good  OT Frequency: Min 2X/week   Barriers to D/C:  Co-evaluation PT/OT/SLP  Co-Evaluation/Treatment: Yes Reason for Co-Treatment: To address functional/ADL transfers;For patient/therapist safety   OT goals addressed during session: ADL's and self-care      AM-PAC OT "6 Clicks" Daily Activity     Outcome Measure Help from another person eating meals?: A Little Help from another person taking care of personal grooming?: A Little Help from another person toileting, which includes using toliet, bedpan, or urinal?: A Lot Help from another person bathing (including washing, rinsing, drying)?: A Lot Help from another person to put on and taking off regular upper body clothing?: A Lot Help from another person to put on and taking off regular lower body clothing?: A Lot 6 Click Score: 14   End of Session Equipment Utilized During Treatment: Other (comment) (Sling) Nurse Communication: Mobility status;Weight bearing status  Activity Tolerance: Patient tolerated treatment well Patient left: in bed;with call bell/phone within reach;with bed alarm set;with family/visitor present  OT Visit Diagnosis: Unsteadiness on feet (R26.81);Other abnormalities of gait and mobility (R26.89);Muscle weakness (generalized) (M62.81);History of falling (Z91.81);Pain                Time: 1440-1511 OT Time Calculation (min): 31 min Charges:  OT General Charges $OT Visit: 1 Visit OT Evaluation $OT Eval Moderate Complexity: 1 Mod  Machi Whittaker MSOT, OTR/L Acute Rehab Pager: [phone] Office: [place] 10/13/2021, 5:43 PM

## 2021-10-14 DIAGNOSIS — I480 Paroxysmal atrial fibrillation: Secondary | ICD-10-CM | POA: Diagnosis not present

## 2021-10-14 DIAGNOSIS — K922 Gastrointestinal hemorrhage, unspecified: Secondary | ICD-10-CM | POA: Diagnosis not present

## 2021-10-14 DIAGNOSIS — Z95810 Presence of automatic (implantable) cardiac defibrillator: Secondary | ICD-10-CM | POA: Diagnosis not present

## 2021-10-14 DIAGNOSIS — D72829 Elevated white blood cell count, unspecified: Secondary | ICD-10-CM | POA: Diagnosis not present

## 2021-10-14 LAB — COMPREHENSIVE METABOLIC PANEL
ALT: 46 U/L — ABNORMAL HIGH (ref 0–44)
AST: 42 U/L — ABNORMAL HIGH (ref 15–41)
Albumin: 2 g/dL — ABNORMAL LOW (ref 3.5–5.0)
Alkaline Phosphatase: 79 U/L (ref 38–126)
Anion gap: 8 (ref 5–15)
BUN: 31 mg/dL — ABNORMAL HIGH (ref 8–23)
CO2: 19 mmol/L — ABNORMAL LOW (ref 22–32)
Calcium: 7.3 mg/dL — ABNORMAL LOW (ref 8.9–10.3)
Chloride: 108 mmol/L (ref 98–111)
Creatinine, Ser: 1.13 mg/dL — ABNORMAL HIGH (ref 0.44–1.00)
GFR, Estimated: 47 mL/min — ABNORMAL LOW (ref >60)
Glucose, Bld: 68 mg/dL — ABNORMAL LOW (ref 70–99)
Potassium: 5.2 mmol/L — ABNORMAL HIGH (ref 3.5–5.1)
Sodium: 135 mmol/L (ref 135–145)
Total Bilirubin: 0.5 mg/dL (ref 0.3–1.2)
Total Protein: 4.4 g/dL — ABNORMAL LOW (ref 6.5–8.1)

## 2021-10-14 LAB — HEMOGLOBIN AND HEMATOCRIT, BLOOD
HCT: 29.3 % — ABNORMAL LOW (ref 36.0–46.0)
Hemoglobin: 9.6 g/dL — ABNORMAL LOW (ref 12.0–15.0)

## 2021-10-14 MED ORDER — AMLODIPINE BESYLATE 2.5 MG PO TABS
2.5000 mg | ORAL_TABLET | Freq: Every day | ORAL | Status: DC
  Administered 2021-10-14 – 2021-10-15 (×2): 2.5 mg via ORAL
  Filled 2021-10-14 (×2): qty 1

## 2021-10-14 MED ORDER — CARVEDILOL 6.25 MG PO TABS
6.2500 mg | ORAL_TABLET | Freq: Two times a day (BID) | ORAL | Status: DC
  Administered 2021-10-14 – 2021-10-18 (×10): 6.25 mg via ORAL
  Filled 2021-10-14 (×10): qty 1

## 2021-10-14 MED ORDER — SODIUM ZIRCONIUM CYCLOSILICATE 10 G PO PACK
10.0000 g | PACK | Freq: Two times a day (BID) | ORAL | Status: AC
  Administered 2021-10-14 (×2): 10 g via ORAL
  Filled 2021-10-14 (×2): qty 1

## 2021-10-14 NOTE — Progress Notes (Signed)
[facility] PROGRESS NOTE    Progress Note  [name]  DQQ:[id number] DOB: [birth year] DOA: 10/12/2021 PCP: Lawerance Cruel, MD     Brief Narrative:   [name] is an 87 y.o. female past medical history of essential hypertension, paroxysmal atrial fibrillation on Eliquis nonischemic cardiomyopathy sick sinus syndrome status post pacemaker, mitral stenosis repair hypothyroidism chronic kidney disease stage IIIa and iron deficiency anemia PE and are on chronic steroids recently discharged from the hospital on 10/06/2021 after sustaining a transverse proximal humeral head fracture and right pubic fracture along with a superior pubic rami fracture was brought into the hospital from rehab after the notice of nausea and constipation.  Labs were done that showed hyperkalemia and worsening renal function which initially improved with IV fluid, subsequently she started choking on her food and regurgitating applesauce. She started developing abdominal pain and passing clots, her hemoglobin was 6.2   Assessment/Plan:   Acute lower GI bleeding/acute blood loss anemia: With a drop in hemoglobin of 6.2.  Eliquis has been held. Type and screen and getting 1 units of packed red blood cells, her hemoglobin is pending this morning. Gastroenterology recommended supportive care gastroenterology recommended to continue supportive care. Hemoglobin is stable this morning. Physical therapy still recommended inpatient rehab.  Leukocytosis: Likely reactive in nature has remained afebrile.  Hyperkalemia: She was given doses of IV Lasix, and her potassium has improved nicely.  Is 4.8.  High today again, will give oral Lokelma recheck a basic metabolic panel tomorrow.  Dysphagia: She is at high risk of aspiration on last admission speech was consulted recommended a dysphagia 2 with thin liquid diet.  Chronic kidney disease stage IIIb: With a baseline creatinine around 1.2, discontinue Foley  catheter  Monitor strict I's and O's. Creatinine remains at baseline.  Essential hypertension: All antihypertensive medications were held at skilled due to borderline low blood pressure. Blood pressure slowly trending up and start Norvasc and Coreg.  Paroxysmal atrial fibrillation: Continue to hold anticoagulation due to GI bleed. She may not be a candidate for anticoagulation in the future, I have discussed this with the patient and she agrees.  Chronic diastolic heart failure: Appears euvolemic continue strict I's and O's and daily weights.  Humerus and pelvic fracture secondary to fall: Orthopedic surgery saw her on her admission at the beginning of November recommended nonoperative management. Physical therapy and Occupational Therapy have been consulted continue oxycodone for pain control.   DVT prophylaxis: scd Family Communication:none Status is: Inpatient  Remains inpatient appropriate because: Acute GI bleed        Code Status:     Code Status Orders  (From admission, onward)           Start     Ordered   10/12/21 0735  Full code  Continuous        10/12/21 0735           Code Status History     Date Active Date Inactive Code Status Order ID Comments User Context   10/12/2021 0609 10/12/2021 0735 Full Code [id number]  [name], MD Inpatient   10/06/2021 1811 10/12/2021 0600 Full Code [id number]  Flora Lipps Inpatient   10/01/2021 1540 10/06/2021 1805 Full Code [id number]  Lequita Halt, MD ED   03/28/2019 1809 03/31/2019 1709 Full Code [id number]  Arrien, Jimmy Picket, MD Inpatient         IV Access:   Peripheral IV   Procedures and diagnostic studies:  CT ABDOMEN PELVIS WO CONTRAST  Addendum Date: 10/13/2021   ADDENDUM REPORT: 10/13/2021 00:59 ADDENDUM: It should be noted there is a small right inguinal hernia with a loop of small bowel within although no obstructive changes are noted. Electronically Signed   By: Inez Catalina M.D.   On: 10/13/2021 00:59   Result Date: 10/13/2021 CLINICAL DATA:  Abdominal distension EXAM: CT ABDOMEN AND PELVIS WITHOUT CONTRAST TECHNIQUE: Multidetector CT imaging of the abdomen and pelvis was performed following the standard protocol without IV contrast. COMPARISON:  Plain film from the previous day FINDINGS: Lower chest: Small bilateral pleural effusions are noted with associated atelectatic changes. No focal confluent infiltrate is seen. Hepatobiliary: No focal liver abnormality is seen. No gallstones, gallbladder wall thickening, or biliary dilatation. Pancreas: Unremarkable. No pancreatic ductal dilatation or surrounding inflammatory changes. Spleen: Normal in size without focal abnormality. Adrenals/Urinary Tract: Adrenal glands are within normal limits. Kidneys are well visualized bilaterally. No renal calculi or urinary tract obstructive changes are seen. Ureters are within normal limits. Bladder is decompressed by Foley catheter. Stomach/Bowel: Colon shows no obstructive or inflammatory changes. Appendix is well visualized and within normal limits. No inflammatory changes are seen. Small bowel and stomach are within normal limits. Vascular/Lymphatic: Aortic atherosclerosis. No enlarged abdominal or pelvic lymph nodes. Reproductive: Status post hysterectomy. No adnexal masses. Other: No abdominal wall hernia or abnormality. No abdominopelvic ascites. Musculoskeletal: Generalized osteopenia is noted. Changes of prior vertebral augmentation are noted at L4, L1 and T10 through T12. IMPRESSION: Small bilateral pleural effusions with associated atelectasis. No obstructive changes are seen. Normal-appearing appendix. Electronically Signed: By: Inez Catalina M.D. On: 10/13/2021 00:53   DG Abd 1 View  Result Date: 10/12/2021 CLINICAL DATA:  Abdominal pain and diarrhea. EXAM: ABDOMEN - 1 VIEW COMPARISON:  10/08/2021 FINDINGS: Scattered air-filled small bowel and colon possibly reflecting an  ileus or gastroenteritis. No free air is identified. Stable vascular calcifications. IMPRESSION: Possible ileus or gastroenteritis. Electronically Signed   By: Marijo Sanes M.D.   On: 10/12/2021 15:02     Medical Consultants:   None.   Subjective:    Genavieve KONSTANTINA NACHREINER no further bloody bowel movements  Objective:    Vitals:   10/13/21 2311 10/14/21 0303 10/14/21 0500 10/14/21 0741  BP: (!) 124/58 (!) 123/48  (!) 154/57  Pulse:    87  Resp:    20  Temp: 97.7 F (36.5 C) 97.7 F (36.5 C)  98 F (36.7 C)  TempSrc: Oral Oral  Oral  SpO2:    100%  Weight:   48.9 kg    SpO2: 100 %   Intake/Output Summary (Last 24 hours) at 10/14/2021 3557 Last data filed at 10/14/2021 0725 Gross per 24 hour  Intake 640 ml  Output 1150 ml  Net -510 ml    Filed Weights   10/13/21 0449 10/14/21 0500  Weight: 51.6 kg 48.9 kg    Exam: General exam: In no acute distress, bruised face right arm on the sling Respiratory system: Good air movement and clear to auscultation. Cardiovascular system: S1 & S2 heard, RRR. No JVD. Gastrointestinal system: Abdomen is nondistended, soft and nontender.  Extremities: No pedal edema. Skin: No rashes, lesions or ulcers Psychiatry: Good judgment and insight of medical condition, for her age she has a very good grasp on her state and medical condition.   Data Reviewed:    Labs: Basic Metabolic Panel: Recent Labs  Lab 10/12/21 0148 10/12/21 0245 10/12/21 1624 10/13/21 0617 10/14/21 3220  NA 133* 134* 134* 136 135  K 5.4* 5.7* 5.0 4.8 5.2*  CL 106 107 109 111 108  CO2 18* 20* 18* 19* 19*  GLUCOSE 94 93 196* 77 68*  BUN 42* 41* 41* 32* 31*  CREATININE 1.29* 1.37* 1.20* 1.01* 1.13*  CALCIUM 7.8* 7.9* 7.4* 7.5* 7.3*    GFR Estimated Creatinine Clearance: 22.9 mL/min (A) (by C-G formula based on SCr of 1.13 mg/dL (H)). Liver Function Tests: Recent Labs  Lab 10/07/21 1434 10/12/21 0245 10/13/21 0617 10/14/21 0303  AST 60* 55* 52* 42*   ALT 59* 66* 54* 46*  ALKPHOS 72 89 77 79  BILITOT 0.9 0.9 0.8 0.5  PROT 5.0* 4.0* 4.3* 4.4*  ALBUMIN 1.9* 1.9* 2.0* 2.0*    No results for input(s): LIPASE, AMYLASE in the last 168 hours. No results for input(s): AMMONIA in the last 168 hours. Coagulation profile Recent Labs  Lab 10/12/21 0245  INR 1.0    COVID-19 Labs  No results for input(s): DDIMER, FERRITIN, LDH, CRP in the last 72 hours.  Lab Results  Component Value Date   [place] NEGATIVE 09/30/2021    CBC: Recent Labs  Lab 10/07/21 1434 10/08/21 0531 10/09/21 0510 10/10/21 0506 10/12/21 0245 10/12/21 0506 10/12/21 1233 10/12/21 1810 10/13/21 0319 10/13/21 0617  WBC 10.4 10.3 11.7* 12.1* 12.0*  --   --   --   --   --   NEUTROABS 8.3* 6.3  --  7.5 8.2*  --   --   --   --   --   HGB 8.8* 8.3* 8.5* 8.7* 7.9* 6.2* 10.4* 9.2* 9.3* 9.3*  HCT 26.4* 25.4* 26.3* 26.3* 24.9* 20.1* 31.4* 28.1* 27.6* 28.3*  MCV 97.1 97.3 98.1 98.1 99.6  --   --   --   --   --   PLT 201 227 294 295 352  --   --   --   --   --     Cardiac Enzymes: No results for input(s): CKTOTAL, CKMB, CKMBINDEX, TROPONINI in the last 168 hours. BNP (last 3 results) No results for input(s): PROBNP in the last 8760 hours. CBG: No results for input(s): GLUCAP in the last 168 hours.  D-Dimer: No results for input(s): DDIMER in the last 72 hours. Hgb A1c: No results for input(s): HGBA1C in the last 72 hours. Lipid Profile: No results for input(s): CHOL, HDL, LDLCALC, TRIG, CHOLHDL, LDLDIRECT in the last 72 hours. Thyroid function studies: Recent Labs    10/12/21 1233  TSH 10.145*    Anemia work up: No results for input(s): VITAMINB12, FOLATE, FERRITIN, TIBC, IRON, RETICCTPCT in the last 72 hours. Sepsis Labs: Recent Labs  Lab 10/08/21 0531 10/09/21 0510 10/10/21 0506 10/12/21 0245  WBC 10.3 11.7* 12.1* 12.0*    Microbiology Recent Results (from the past 240 hour(s))  Urine Culture     Status: None   Collection Time:  10/09/21  1:29 PM   Specimen: Urine, Catheterized  Result Value Ref Range Status   Specimen Description URINE, CATHETERIZED  Final   Special Requests NONE  Final   Culture   Final    NO GROWTH Performed at [facility], [address], [place], [place] [address]    Report Status 10/11/2021 FINAL  Final     Medications:    atorvastatin  40 mg Oral Daily   HYDROcodone-acetaminophen  1 tablet Oral TID AC   levothyroxine  75 mcg Oral Daily   methylPREDNISolone  4 mg Oral Daily  ondansetron  4 mg Oral Q12H   pantoprazole  40 mg Oral Daily   Continuous Infusions:    LOS: 2 days   Charlynne Cousins  [facility]  10/14/2021, 8:21 AM

## 2021-10-15 DIAGNOSIS — K922 Gastrointestinal hemorrhage, unspecified: Secondary | ICD-10-CM | POA: Diagnosis not present

## 2021-10-15 LAB — BASIC METABOLIC PANEL
Anion gap: 6 (ref 5–15)
BUN: 33 mg/dL — ABNORMAL HIGH (ref 8–23)
CO2: 21 mmol/L — ABNORMAL LOW (ref 22–32)
Calcium: 7.5 mg/dL — ABNORMAL LOW (ref 8.9–10.3)
Chloride: 108 mmol/L (ref 98–111)
Creatinine, Ser: 1.15 mg/dL — ABNORMAL HIGH (ref 0.44–1.00)
GFR, Estimated: 46 mL/min — ABNORMAL LOW (ref >60)
Glucose, Bld: 82 mg/dL (ref 70–99)
Potassium: 4.6 mmol/L (ref 3.5–5.1)
Sodium: 135 mmol/L (ref 135–145)

## 2021-10-15 MED ORDER — AMLODIPINE BESYLATE 5 MG PO TABS
5.0000 mg | ORAL_TABLET | Freq: Every day | ORAL | Status: DC
  Administered 2021-10-16 – 2021-10-18 (×3): 5 mg via ORAL
  Filled 2021-10-15 (×3): qty 1

## 2021-10-15 MED ORDER — ATORVASTATIN CALCIUM 40 MG PO TABS: 40.0000 mg | ORAL_TABLET | Freq: Every day | ORAL | Status: DC

## 2021-10-15 MED ORDER — SIMETHICONE 80 MG PO CHEW: 80.0000 mg | CHEWABLE_TABLET | Freq: Four times a day (QID) | ORAL | Status: DC | PRN

## 2021-10-15 NOTE — Plan of Care (Signed)
  Problem: Health Behavior/Discharge Planning: Goal: Ability to manage health-related needs will improve Outcome: Progressing   Problem: Clinical Measurements: Goal: Ability to maintain clinical measurements within normal limits will improve Outcome: Progressing Goal: Will remain free from infection Outcome: Progressing Goal: Diagnostic test results will improve Outcome: Progressing Goal: Cardiovascular complication will be avoided Outcome: Progressing

## 2021-10-15 NOTE — Progress Notes (Signed)
[facility] PROGRESS NOTE    Progress Note  [name]  TIR:[id number] DOB: December 01, 1931 DOA: 10/12/2021 PCP: Lawerance Cruel, MD     Brief Narrative:   [name] is an 87 y.o. female past medical history of essential hypertension, paroxysmal atrial fibrillation on Eliquis nonischemic cardiomyopathy sick sinus syndrome status post pacemaker, mitral stenosis repair hypothyroidism chronic kidney disease stage IIIa and iron deficiency anemia PE and are on chronic steroids recently discharged from the hospital on 10/06/2021 after sustaining a transverse proximal humeral head fracture and right pubic fracture along with a superior pubic rami fracture was brought into the hospital from rehab after the notice of nausea and constipation.  Labs were done that showed hyperkalemia and worsening renal function which initially improved with IV fluid, subsequently she started choking on her food and regurgitating applesauce. She started developing abdominal pain and passing clots, her hemoglobin was 6.2   Assessment/Plan:   Acute lower GI bleeding/acute blood loss anemia: Status post 1 unit of packed red blood cells. Gastroenterology was consulted recommended no intervention unless she has recurrent active bleeding Hemoglobin is stable this morning. Awaiting inpatient rehab admission.    Leukocytosis: Likely reactive in nature has remained afebrile.  Hyperkalemia: Her potassium is improved today after Lokelma.  Dysphagia: She is at high risk of aspiration on last admission speech was consulted recommended a dysphagia 2 with thin liquid diet.  Chronic kidney disease stage IIIb: With a baseline creatinine around 1.2, discontinue Foley catheter  Monitor strict I's and O's. Creatinine remains at baseline.  Essential hypertension: All antihypertensive medications were held at skilled due to borderline low blood pressure. Blood pressure continues to increase continue Norvasc and  Coreg  Paroxysmal atrial fibrillation: Continue to hold anticoagulation due to GI bleed. She may not be a candidate for anticoagulation in the future, I have discussed this with the patient and she agrees.  Chronic diastolic heart failure: Appears euvolemic continue strict I's and O's and daily weights.  Humerus and pelvic fracture secondary to fall: Orthopedic surgery saw her on her admission at the beginning of November recommended nonoperative management. Physical therapy and Occupational Therapy have been consulted continue oxycodone for pain control.   DVT prophylaxis: scd Family Communication:none Status is: Inpatient  Remains inpatient appropriate because: Acute GI bleed        Code Status:     Code Status Orders  (From admission, onward)           Start     Ordered   10/12/21 0735  Full code  Continuous        10/12/21 0735           Code Status History     Date Active Date Inactive Code Status Order ID Comments User Context   10/12/2021 0609 10/12/2021 0735 Full Code [id number]  [name], MD Inpatient   10/06/2021 1811 10/12/2021 0600 Full Code [id number]  Flora Lipps Inpatient   10/01/2021 1540 10/06/2021 1805 Full Code [id number]  Lequita Halt, MD ED   03/28/2019 1809 03/31/2019 1709 Full Code [id number]  Arrien, Jimmy Picket, MD Inpatient         IV Access:   Peripheral IV   Procedures and diagnostic studies:   No results found.   Medical Consultants:   None.   Subjective:    [employer] no further bloody bowel movements she had a bowel movement this morning and was soft no blood  Objective:  Vitals:   10/14/21 2259 10/15/21 0303 10/15/21 0500 10/15/21 0800  BP: (!) 122/49 (!) 119/51  (!) 161/68  Pulse: 77 76  84  Resp: 16 16  16   Temp: (!) 97.4 F (36.3 C) 97.8 F (36.6 C)  97.7 F (36.5 C)  TempSrc: Oral Oral  Oral  SpO2: 97% 96%  100%  Weight:   48.6 kg    SpO2: 100 %   Intake/Output  Summary (Last 24 hours) at 10/15/2021 0956 Last data filed at 10/15/2021 0847 Gross per 24 hour  Intake 360 ml  Output 650 ml  Net -290 ml    Filed Weights   10/13/21 0449 10/14/21 0500 10/15/21 0500  Weight: 51.6 kg 48.9 kg 48.6 kg    Exam: General exam: In no acute distress. Respiratory system: Good air movement and clear to auscultation. Cardiovascular system: S1 & S2 heard, RRR. No JVD. Gastrointestinal system: Abdomen is nondistended, soft and nontender.  Extremities: No pedal edema. Skin: No rashes, lesions or ulcers Psychiatry: Judgement and insight appear normal. Mood & affect appropriate.   Data Reviewed:    Labs: Basic Metabolic Panel: Recent Labs  Lab 10/12/21 0245 10/12/21 1624 10/13/21 0617 10/14/21 0303 10/15/21 0545  NA 134* 134* 136 135 135  K 5.7* 5.0 4.8 5.2* 4.6  CL 107 109 111 108 108  CO2 20* 18* 19* 19* 21*  GLUCOSE 93 196* 77 68* 82  BUN 41* 41* 32* 31* 33*  CREATININE 1.37* 1.20* 1.01* 1.13* 1.15*  CALCIUM 7.9* 7.4* 7.5* 7.3* 7.5*    GFR Estimated Creatinine Clearance: 22.4 mL/min (A) (by C-G formula based on SCr of 1.15 mg/dL (H)). Liver Function Tests: Recent Labs  Lab 10/12/21 0245 10/13/21 0617 10/14/21 0303  AST 55* 52* 42*  ALT 66* 54* 46*  ALKPHOS 89 77 79  BILITOT 0.9 0.8 0.5  PROT 4.0* 4.3* 4.4*  ALBUMIN 1.9* 2.0* 2.0*    No results for input(s): LIPASE, AMYLASE in the last 168 hours. No results for input(s): AMMONIA in the last 168 hours. Coagulation profile Recent Labs  Lab 10/12/21 0245  INR 1.0    COVID-19 Labs  No results for input(s): DDIMER, FERRITIN, LDH, CRP in the last 72 hours.  Lab Results  Component Value Date   [place] NEGATIVE 09/30/2021    CBC: Recent Labs  Lab 10/09/21 0510 10/10/21 0506 10/12/21 0245 10/12/21 0506 10/12/21 1233 10/12/21 1810 10/13/21 0319 10/13/21 0617 10/14/21 0834  WBC 11.7* 12.1* 12.0*  --   --   --   --   --   --   NEUTROABS  --  7.5 8.2*  --   --    --   --   --   --   HGB 8.5* 8.7* 7.9*   < > 10.4* 9.2* 9.3* 9.3* 9.6*  HCT 26.3* 26.3* 24.9*   < > 31.4* 28.1* 27.6* 28.3* 29.3*  MCV 98.1 98.1 99.6  --   --   --   --   --   --   PLT 294 295 352  --   --   --   --   --   --    < > = values in this interval not displayed.    Cardiac Enzymes: No results for input(s): CKTOTAL, CKMB, CKMBINDEX, TROPONINI in the last 168 hours. BNP (last 3 results) No results for input(s): PROBNP in the last 8760 hours. CBG: No results for input(s): GLUCAP in the last 168 hours.  D-Dimer: No results  for input(s): DDIMER in the last 72 hours. Hgb A1c: No results for input(s): HGBA1C in the last 72 hours. Lipid Profile: No results for input(s): CHOL, HDL, LDLCALC, TRIG, CHOLHDL, LDLDIRECT in the last 72 hours. Thyroid function studies: Recent Labs    10/12/21 1233  TSH 10.145*    Anemia work up: No results for input(s): VITAMINB12, FOLATE, FERRITIN, TIBC, IRON, RETICCTPCT in the last 72 hours. Sepsis Labs: Recent Labs  Lab 10/09/21 0510 10/10/21 0506 10/12/21 0245  WBC 11.7* 12.1* 12.0*    Microbiology Recent Results (from the past 240 hour(s))  Urine Culture     Status: None   Collection Time: 10/09/21  1:29 PM   Specimen: Urine, Catheterized  Result Value Ref Range Status   Specimen Description URINE, CATHETERIZED  Final   Special Requests NONE  Final   Culture   Final    NO GROWTH Performed at [facility], [address] [place], [place] [address]    Report Status 10/11/2021 FINAL  Final     Medications:    amLODipine  2.5 mg Oral Daily   atorvastatin  40 mg Oral Daily   carvedilol  6.25 mg Oral BID WC   HYDROcodone-acetaminophen  1 tablet Oral TID AC   levothyroxine  75 mcg Oral Daily   methylPREDNISolone  4 mg Oral Daily   ondansetron  4 mg Oral Q12H   pantoprazole  40 mg Oral Daily   Continuous Infusions:    LOS: 3 days   Charlynne Cousins  [facility]  10/15/2021, 9:56 AM

## 2021-10-15 NOTE — Plan of Care (Signed)
  Problem: Health Behavior/Discharge Planning: Goal: Ability to manage health-related needs will improve 10/15/2021 1739 by Cassell Smiles, RN Outcome: Progressing 10/15/2021 1737 by Cassell Smiles, RN Outcome: Progressing   Problem: Education: Goal: Knowledge of General Education information will improve Description: Including pain rating scale, medication(s)/side effects and non-pharmacologic comfort measures Outcome: Progressing   Problem: Health Behavior/Discharge Planning: Goal: Ability to manage health-related needs will improve Outcome: Progressing   Problem: Clinical Measurements: Goal: Ability to maintain clinical measurements within normal limits will improve Outcome: Progressing Goal: Will remain free from infection Outcome: Progressing Goal: Diagnostic test results will improve Outcome: Progressing Goal: Cardiovascular complication will be avoided Outcome: Progressing   Problem: Activity: Goal: Risk for activity intolerance will decrease Outcome: Progressing   Problem: Nutrition: Goal: Adequate nutrition will be maintained Outcome: Progressing   Problem: Coping: Goal: Level of anxiety will decrease Outcome: Progressing   Problem: Pain Managment: Goal: General experience of comfort will improve Outcome: Progressing   Problem: Safety: Goal: Ability to remain free from injury will improve Outcome: Progressing   Problem: Skin Integrity: Goal: Risk for impaired skin integrity will decrease Outcome: Progressing

## 2021-10-16 DIAGNOSIS — K922 Gastrointestinal hemorrhage, unspecified: Secondary | ICD-10-CM | POA: Diagnosis not present

## 2021-10-16 LAB — BASIC METABOLIC PANEL
Anion gap: 7 (ref 5–15)
BUN: 32 mg/dL — ABNORMAL HIGH (ref 8–23)
CO2: 21 mmol/L — ABNORMAL LOW (ref 22–32)
Calcium: 7.9 mg/dL — ABNORMAL LOW (ref 8.9–10.3)
Chloride: 107 mmol/L (ref 98–111)
Creatinine, Ser: 0.98 mg/dL (ref 0.44–1.00)
GFR, Estimated: 56 mL/min — ABNORMAL LOW (ref >60)
Glucose, Bld: 93 mg/dL (ref 70–99)
Potassium: 4.6 mmol/L (ref 3.5–5.1)
Sodium: 135 mmol/L (ref 135–145)

## 2021-10-16 LAB — HEMOGLOBIN AND HEMATOCRIT, BLOOD
HCT: 28.7 % — ABNORMAL LOW (ref 36.0–46.0)
Hemoglobin: 9.3 g/dL — ABNORMAL LOW (ref 12.0–15.0)

## 2021-10-16 NOTE — Progress Notes (Signed)
Bladder scanned patient, >510 ml. In & out cath output of 800. Pt. Tolerated well and verbalized feeling better. Will continue to assess and monitor.

## 2021-10-16 NOTE — Progress Notes (Signed)
Inpatient Rehab Admissions Coordinator:   I do not have a CIR bed for this Pt. Today. I will continue to follow for potential admit pending medical readiness and bed availability.   Clemens Catholic, [place], [place] Admissions Coordinator  [phone] ([place]) [phone] (office)

## 2021-10-16 NOTE — Progress Notes (Signed)
Occupational Therapy Treatment Patient Details Name: [name] MRN: [id number] DOB: [birth year] Today's Date: 10/16/2021   History of present illness 87 y/o female from [place] on 11/17 for GI bleed. Recently admitted after a fall with right humeral neck and greater tuberosity and right pubic ramus fx. Significant PMH: aneurysm, basal cell carcinoma, HTN, left heart failure, osteoporosis, pacemaker, polymyalgia, and compression fractures.   OT comments  Pt progressing towards established OT goals and continues to demonstrate high motivation to participate in therapy. Pt performing UB bathing with Mod A and LB bathing with Max A. Pt requiring Max A for donning gown and sling. Providing education and cues throughout for compensatory techniques for UB ADLs to maintain shoulder precautions (no shoulder ROM). Pt requiring Mod A for stand pivot to recliner. Continue to recommend dc to CIR and will continue to follow acutely as admitted.    Recommendations for follow up therapy are one component of a multi-disciplinary discharge planning process, led by the attending physician.  Recommendations may be updated based on patient status, additional functional criteria and insurance authorization.    Follow Up Recommendations  Acute inpatient rehab (3hours/day)    Assistance Recommended at Discharge Frequent or constant Supervision/Assistance  Equipment Recommendations  Tub/shower seat    Recommendations for Other Services PT consult;Rehab consult    Precautions / Restrictions Precautions Precautions: Fall Precaution Comments: Significant bruising to face/neck R>L, High fall risk Required Braces or Orthoses: Sling Restrictions Weight Bearing Restrictions: Yes RUE Weight Bearing: Non weight bearing RLE Weight Bearing: Weight bearing as tolerated LLE Weight Bearing: Weight bearing as tolerated Other Position/Activity Restrictions: Sling at all times except for during ADLs; keep elbow close to body  when not in sling. No PROM/AROM at R shoulder for 2-3 weeks       Mobility Bed Mobility Overal bed mobility: Needs Assistance Bed Mobility: Supine to Sit     Supine to sit: Min assist     General bed mobility comments: minA to come to EOB with assist for trunk elevation    Transfers Overall transfer level: Needs assistance Equipment used: 1 person hand held assist Transfers: Sit to/from Stand;Bed to chair/wheelchair/BSC Sit to Stand: Min assist   Step pivot transfers: Mod assist       General transfer comment: Min A for power up and then Mod A for pivot to recliner due to posterior lean     Balance Overall balance assessment: Needs assistance Sitting-balance support: Feet supported Sitting balance-Leahy Scale: Good     Standing balance support: Single extremity supported;During functional activity Standing balance-Leahy Scale: Poor Standing balance comment: reliant on phsyical A and UE support                           ADL either performed or assessed with clinical judgement   ADL Overall ADL's : Needs assistance/impaired         Upper Body Bathing: Moderate assistance;Sitting Upper Body Bathing Details (indicate cue type and reason): Providing cues and education for compensatory techniques. Pt able to wash under R axillary area with assistance to maintain limited shoulder movement. Pt washing L axillary area by reaching LUE under as cued. Assist for washing her back. Lower Body Bathing: Maximal assistance;Sit to/from stand Lower Body Bathing Details (indicate cue type and reason): Pt washing anterior peri area while seated and leaning back. Assistance to wash posterior peri area while in standing. Cues for weight shifting forward to toes to counter act posterior  lean. Min A for power up into standing Upper Body Dressing : Maximal assistance;Sitting Upper Body Dressing Details (indicate cue type and reason): Max A for donning new gown and donning  sling. Lower Body Dressing: Maximal assistance Lower Body Dressing Details (indicate cue type and reason): donned socks Toilet Transfer: Moderate assistance;Stand-pivot (simulated to recliner) Toilet Transfer Details (indicate cue type and reason): Mod A for balance and posterior lean         Functional mobility during ADLs: Moderate assistance (stand pivot only) General ADL Comments: Pt performing UB bathing, LB bathing, and dressing. Stand pivot to recliner. Providing cues for weight shift forward in standing - and pt demonstrating improved balance with good rehab potentional. Fatigued at end of session but still motivated to practice with PT    Extremity/Trunk Assessment Upper Extremity Assessment Upper Extremity Assessment: RUE deficits/detail RUE Deficits / Details: No shoulder ROM. cleared for AROM of hand, wrist, and elbow. Wear sling unless performing ADLs; keep elbow close to body when sling off.  k-tape on the dorsal portion of her R hand and along each finger looks intact and no redness noted. RUE Sensation: WNL RUE Coordination: decreased gross motor LUE Deficits / Details: Noting edema at L forearm   Lower Extremity Assessment Lower Extremity Assessment: Defer to PT evaluation RLE Deficits / Details: Increased edema, pt able to perform LAQ, ankle dorsiflexion 3/5 LLE Deficits / Details: Increased edema. Pt able to perform limited heel slide and SLR. Ankle dorsiflexion 2/5        Vision       Perception Perception Perception: Not tested   Praxis Praxis Praxis: Not tested    Cognition Arousal/Alertness: Awake/alert Behavior During Therapy: WFL for tasks assessed/performed Overall Cognitive Status: Within Functional Limits for tasks assessed                                 General Comments: Moments with fear of falling but benefits from calming cues. Very motivated          Exercises     Shoulder Instructions       General Comments PT  arriving at end of session    Pertinent Vitals/ Pain       Pain Assessment: Faces Faces Pain Scale: Hurts little more Pain Location: back, legs Pain Descriptors / Indicators: Discomfort;Grimacing;Guarding Pain Intervention(s): Monitored during session;Limited activity within patient's tolerance;Premedicated before session;Repositioned  Home Living                                          Prior Functioning/Environment              Frequency  Min 2X/week        Progress Toward Goals  OT Goals(current goals can now be found in the care plan section)  Progress towards OT goals: Progressing toward goals  Acute Rehab OT Goals Patient Stated Goal: Return to rehab OT Goal Formulation: With patient Time For Goal Achievement: 10/27/21 Potential to Achieve Goals: Good ADL Goals Pt Will Perform Grooming: with supervision;with min guard assist;sitting Pt Will Perform Upper Body Dressing: with set-up;with supervision;sitting Pt Will Perform Lower Body Dressing: with min assist;sit to/from stand Pt Will Transfer to Toilet: with min guard assist;bedside commode;ambulating Pt Will Perform Toileting - Clothing Manipulation and hygiene: with min guard assist;sitting/lateral leans;sit to/from stand  Plan  Discharge plan remains appropriate    Co-evaluation    PT/OT/SLP Co-Evaluation/Treatment: Yes Reason for Co-Treatment: For patient/therapist safety;To address functional/ADL transfers   OT goals addressed during session: ADL's and self-care      AM-PAC OT "6 Clicks" Daily Activity     Outcome Measure   Help from another person eating meals?: A Little Help from another person taking care of personal grooming?: A Little Help from another person toileting, which includes using toliet, bedpan, or urinal?: A Lot Help from another person bathing (including washing, rinsing, drying)?: A Lot Help from another person to put on and taking off regular upper body  clothing?: A Lot Help from another person to put on and taking off regular lower body clothing?: A Lot 6 Click Score: 14    End of Session Equipment Utilized During Treatment: Other (comment) (Sling)  OT Visit Diagnosis: Unsteadiness on feet (R26.81);Other abnormalities of gait and mobility (R26.89);Muscle weakness (generalized) (M62.81);History of falling (Z91.81);Pain   Activity Tolerance Patient tolerated treatment well   Patient Left with call bell/phone within reach;in chair (PT)   Nurse Communication Mobility status;Weight bearing status        Time: 5397-6734 OT Time Calculation (min): 32 min  Charges: OT General Charges $OT Visit: 1 Visit OT Treatments $Self Care/Home Management : 23-37 mins  Cole, OTR/L Acute Rehab Pager: [phone] Office: [place] 10/16/2021, 5:16 PM

## 2021-10-16 NOTE — Progress Notes (Signed)
[facility] PROGRESS NOTE    Progress Note  [name]  NGE:[id number] DOB: Feb 08, 1932 DOA: 10/12/2021 PCP: Lawerance Cruel, MD     Brief Narrative:   [name] is an 87 y.o. female past medical history of essential hypertension, paroxysmal atrial fibrillation on Eliquis nonischemic cardiomyopathy sick sinus syndrome status post pacemaker, mitral stenosis repair hypothyroidism chronic kidney disease stage IIIa and iron deficiency anemia PE and are on chronic steroids recently discharged from the hospital on 10/06/2021 after sustaining a transverse proximal humeral head fracture and right pubic fracture along with a superior pubic rami fracture was brought into the hospital from rehab after the notice of nausea and constipation.  Labs were done that showed hyperkalemia and worsening renal function which initially improved with IV fluid, subsequently she started choking on her food and regurgitating applesauce. She started developing abdominal pain and passing clots, her hemoglobin was 6.2.  GI was consulted recommended no intervention unless she recurrently active bleeds severely they have signed off.  Assessment/Plan:   Acute lower GI bleeding/acute blood loss anemia: Status post 1 unit of packed red blood cells. Had a burgundy bowel movement overnight, vitals are stable except for her blood pressure slightly elevated hopefully with her antihypertensive medication need to improve. Will contact you to keep a close eye on her hemoglobin. Her hemoglobin has remained stable at 9.3. Awaiting inpatient rehab admission.    Leukocytosis: Likely reactive in nature has remained afebrile.  Hyperkalemia: She is got Lokelma twice, will repeat a basic metabolic panel today.  Dysphagia: She is at high risk of aspiration on last admission speech was consulted recommended a dysphagia 2 with thin liquid diet.  Chronic kidney disease stage IIIb: With a baseline creatinine around 1.2,  discontinue Foley catheter  Monitor strict I's and O's. Creatinine remains at baseline.  Essential hypertension: All antihypertensive medications were held at skilled due to borderline low blood pressure. Blood pressure continues to increase continue Norvasc and Coreg  Paroxysmal atrial fibrillation: Continue to hold anticoagulation due to GI bleed. She may not be a candidate for anticoagulation in the future, I have discussed this with the patient and she agrees.  Chronic diastolic heart failure: Appears euvolemic continue strict I's and O's and daily weights.  Humerus and pelvic fracture secondary to fall: Orthopedic surgery saw her on her admission at the beginning of November recommended nonoperative management. Physical therapy and Occupational Therapy have been consulted continue oxycodone for pain control.   DVT prophylaxis: scd Family Communication:none Status is: Inpatient  Remains inpatient appropriate because: Acute GI bleed        Code Status:     Code Status Orders  (From admission, onward)           Start     Ordered   10/12/21 0735  Full code  Continuous        10/12/21 0735           Code Status History     Date Active Date Inactive Code Status Order ID Comments User Context   10/12/2021 0609 10/12/2021 0735 Full Code [id number]  [name], MD Inpatient   10/06/2021 1811 10/12/2021 0600 Full Code [id number]  Flora Lipps Inpatient   10/01/2021 1540 10/06/2021 1805 Full Code [id number]  Lequita Halt, MD ED   03/28/2019 1809 03/31/2019 1709 Full Code [id number]  Arrien, Jimmy Picket, MD Inpatient         IV Access:   Peripheral IV   Procedures  and diagnostic studies:   No results found.   Medical Consultants:   None.   Subjective:    [name] had a small bloody bowel movement overnight some cramping.  Objective:    Vitals:   10/16/21 0312 10/16/21 0500 10/16/21 0735 10/16/21 0817  BP: (!) 118/48  (!)  159/68 (!) 185/79  Pulse: 68   89  Resp: 14  20 18   Temp: 98.2 F (36.8 C)  98.1 F (36.7 C) 98.1 F (36.7 C)  TempSrc: Oral  Oral   SpO2: 95%     Weight:  48.8 kg     SpO2: 95 %  No intake or output data in the 24 hours ending 10/16/21 1017  Filed Weights   10/14/21 0500 10/15/21 0500 10/16/21 0500  Weight: 48.9 kg 48.6 kg 48.8 kg    Exam: General exam: In no acute distress. Respiratory system: Good air movement and clear to auscultation. Cardiovascular system: S1 & S2 heard, RRR. No JVD. Gastrointestinal system: Abdomen is nondistended, soft and nontender.  Extremities: No pedal edema. Skin: No rashes, lesions or ulcers Psychiatry: Judgement and insight appear normal. Mood & affect appropriate.   Data Reviewed:    Labs: Basic Metabolic Panel: Recent Labs  Lab 10/12/21 0245 10/12/21 1624 10/13/21 0617 10/14/21 0303 10/15/21 0545  NA 134* 134* 136 135 135  K 5.7* 5.0 4.8 5.2* 4.6  CL 107 109 111 108 108  CO2 20* 18* 19* 19* 21*  GLUCOSE 93 196* 77 68* 82  BUN 41* 41* 32* 31* 33*  CREATININE 1.37* 1.20* 1.01* 1.13* 1.15*  CALCIUM 7.9* 7.4* 7.5* 7.3* 7.5*    GFR Estimated Creatinine Clearance: 22.5 mL/min (A) (by C-G formula based on SCr of 1.15 mg/dL (H)). Liver Function Tests: Recent Labs  Lab 10/12/21 0245 10/13/21 0617 10/14/21 0303  AST 55* 52* 42*  ALT 66* 54* 46*  ALKPHOS 89 77 79  BILITOT 0.9 0.8 0.5  PROT 4.0* 4.3* 4.4*  ALBUMIN 1.9* 2.0* 2.0*    No results for input(s): LIPASE, AMYLASE in the last 168 hours. No results for input(s): AMMONIA in the last 168 hours. Coagulation profile Recent Labs  Lab 10/12/21 0245  INR 1.0    COVID-19 Labs  No results for input(s): DDIMER, FERRITIN, LDH, CRP in the last 72 hours.  Lab Results  Component Value Date   [place] NEGATIVE 09/30/2021    CBC: Recent Labs  Lab 10/10/21 0506 10/12/21 0245 10/12/21 0506 10/12/21 1810 10/13/21 0319 10/13/21 0617 10/14/21 0834 10/16/21 0851   WBC 12.1* 12.0*  --   --   --   --   --   --   NEUTROABS 7.5 8.2*  --   --   --   --   --   --   HGB 8.7* 7.9*   < > 9.2* 9.3* 9.3* 9.6* 9.3*  HCT 26.3* 24.9*   < > 28.1* 27.6* 28.3* 29.3* 28.7*  MCV 98.1 99.6  --   --   --   --   --   --   PLT 295 352  --   --   --   --   --   --    < > = values in this interval not displayed.    Cardiac Enzymes: No results for input(s): CKTOTAL, CKMB, CKMBINDEX, TROPONINI in the last 168 hours. BNP (last 3 results) No results for input(s): PROBNP in the last 8760 hours. CBG: No results for input(s): GLUCAP in the last  168 hours.  D-Dimer: No results for input(s): DDIMER in the last 72 hours. Hgb A1c: No results for input(s): HGBA1C in the last 72 hours. Lipid Profile: No results for input(s): CHOL, HDL, LDLCALC, TRIG, CHOLHDL, LDLDIRECT in the last 72 hours. Thyroid function studies: No results for input(s): TSH, T4TOTAL, T3FREE, THYROIDAB in the last 72 hours.  Invalid input(s): FREET3  Anemia work up: No results for input(s): VITAMINB12, FOLATE, FERRITIN, TIBC, IRON, RETICCTPCT in the last 72 hours. Sepsis Labs: Recent Labs  Lab 10/10/21 0506 10/12/21 0245  WBC 12.1* 12.0*    Microbiology Recent Results (from the past 240 hour(s))  Urine Culture     Status: None   Collection Time: 10/09/21  1:29 PM   Specimen: Urine, Catheterized  Result Value Ref Range Status   Specimen Description URINE, CATHETERIZED  Final   Special Requests NONE  Final   Culture   Final    NO GROWTH Performed at [facility], [address] [place], [place] [address]    Report Status 10/11/2021 FINAL  Final     Medications:    amLODipine  5 mg Oral Daily   atorvastatin  40 mg Oral Daily   carvedilol  6.25 mg Oral BID WC   HYDROcodone-acetaminophen  1 tablet Oral TID AC   levothyroxine  75 mcg Oral Daily   methylPREDNISolone  4 mg Oral Daily   ondansetron  4 mg Oral Q12H   pantoprazole  40 mg Oral Daily   Continuous Infusions:    LOS:  4 days   Charlynne Cousins  [facility]  10/16/2021, 10:17 AM

## 2021-10-16 NOTE — Progress Notes (Signed)
   10/15/21 2100  Unmeasured Output  Stool Occurrence 1 (frank blood)  Moderate amount in brief.

## 2021-10-16 NOTE — Progress Notes (Addendum)
Physical Therapy Treatment Patient Details Name: [name] MRN: [id number] DOB: 1932-08-24 Today's Date: 10/16/2021   History of Present Illness 87 y/o female from [place] on 10/12/21 for GI bleed. Recently admitted after a fall with right humeral neck and greater tuberosity and right pubic ramus fx. Significant PMH: aneurysm, basal cell carcinoma, HTN, left heart failure, osteoporosis, pacemaker, polymyalgia, and compression fractures.    PT Comments    Goals updated today.  Pt is moving better, however remains anxious and a bit fearful of falling.  She is mod assist for transitions to her feet and to maintain balance while standing with one UE supported on the back of a locked chair.  She would be safer with two person assist for gait progression.  PT will continue to follow acutely for safe mobility progression.   Recommendations for follow up therapy are one component of a multi-disciplinary discharge planning process, led by the attending physician.  Recommendations may be updated based on patient status, additional functional criteria and insurance authorization.  Follow Up Recommendations  Acute inpatient rehab (3hours/day)     Assistance Recommended at Discharge Frequent or constant Supervision/Assistance  Equipment Recommendations  BSC/3in1;Wheelchair (measurements PT);Wheelchair cushion (measurements PT)    Recommendations for Other Services Rehab consult     Precautions / Restrictions Precautions Precautions: Fall Precaution Comments: Significant bruising to face/neck R>L, High fall risk Restrictions Weight Bearing Restrictions: Yes RUE Weight Bearing: Non weight bearing RLE Weight Bearing: Weight bearing as tolerated LLE Weight Bearing: Weight bearing as tolerated     Mobility  Bed Mobility               General bed mobility comments: Pt OOB in the recliner chair after working with OT    Transfers Overall transfer level: Needs assistance Equipment used: 1  person hand held assist Transfers: Sit to/from Stand Sit to Stand: Min assist;Mod assist           General transfer comment: Heavy min to light mod assist to stand from low recliner chair multiple times for strengthening and balance training.    Ambulation/Gait               General Gait Details: Pt would be safer with second person to assist in gait progression.   Stairs             Wheelchair Mobility    Modified Rankin (Stroke Patients Only)       Balance Overall balance assessment: Needs assistance Sitting-balance support: Feet supported Sitting balance-Leahy Scale: Good     Standing balance support: Single extremity supported Standing balance-Leahy Scale: Poor Standing balance comment: needs one UE supported for balance and mod assist in standing. Very wide BOS.                            Cognition Arousal/Alertness: Awake/alert Behavior During Therapy: WFL for tasks assessed/performed Overall Cognitive Status: Within Functional Limits for tasks assessed                                          Exercises General Exercises - Lower Extremity Ankle Circles/Pumps: AROM;Both;10 reps Long Arc Quad: AROM;Both;10 reps Hip Flexion/Marching: AROM;Both;10 reps    General Comments        Pertinent Vitals/Pain Pain Assessment: Faces Faces Pain Scale: Hurts little more Pain Location: back, legs Pain Descriptors /  Indicators: Discomfort;Grimacing;Guarding Pain Intervention(s): Limited activity within patient's tolerance;Monitored during session;Repositioned    Home Living                          Prior Function            PT Goals (current goals can now be found in the care plan section) Acute Rehab PT Goals Patient Stated Goal: less pain Time For Goal Achievement: 10/30/21 Potential to Achieve Goals: Good Progress towards PT goals: Progressing toward goals    Frequency    Min 3X/week      PT  Plan Current plan remains appropriate    Co-evaluation              AM-PAC PT "6 Clicks" Mobility   Outcome Measure  Help needed turning from your back to your side while in a flat bed without using bedrails?: A Little Help needed moving from lying on your back to sitting on the side of a flat bed without using bedrails?: A Little Help needed moving to and from a bed to a chair (including a wheelchair)?: A Lot Help needed standing up from a chair using your arms (e.g., wheelchair or bedside chair)?: A Lot Help needed to walk in hospital room?: Total Help needed climbing 3-5 steps with a railing? : Total 6 Click Score: 12    End of Session Equipment Utilized During Treatment: Other (comment) (R UE shoulder immobilizer) Activity Tolerance: Patient limited by pain;Patient limited by fatigue Patient left: in chair;with call bell/phone within reach;with chair alarm set   PT Visit Diagnosis: Unsteadiness on feet (R26.81);Muscle weakness (generalized) (M62.81);Difficulty in walking, not elsewhere classified (R26.2);Pain Pain - Right/Left: Right Pain - part of body: Shoulder;Leg     Time: 3818-4037 PT Time Calculation (min) (ACUTE ONLY): 30 min  Charges:  $Therapeutic Activity: 23-37 mins                     Verdene Lennert, PT, DPT  Acute Rehabilitation Ortho Tech Supervisor [phone] pager [phone]) [phone] office

## 2021-10-17 DIAGNOSIS — K922 Gastrointestinal hemorrhage, unspecified: Secondary | ICD-10-CM | POA: Diagnosis not present

## 2021-10-17 LAB — HEMOGLOBIN AND HEMATOCRIT, BLOOD
HCT: 27 % — ABNORMAL LOW (ref 36.0–46.0)
Hemoglobin: 8.7 g/dL — ABNORMAL LOW (ref 12.0–15.0)

## 2021-10-17 MED ORDER — LISINOPRIL 10 MG PO TABS
10.0000 mg | ORAL_TABLET | Freq: Every day | ORAL | Status: DC
  Administered 2021-10-17 – 2021-10-18 (×2): 10 mg via ORAL
  Filled 2021-10-17 (×2): qty 1

## 2021-10-17 MED ORDER — CHLORHEXIDINE GLUCONATE CLOTH 2 % EX PADS
6.0000 | MEDICATED_PAD | Freq: Every day | CUTANEOUS | Status: DC
  Administered 2021-10-17 – 2021-10-18 (×2): 6 via TOPICAL

## 2021-10-17 NOTE — Progress Notes (Signed)
Physical Therapy Treatment Patient Details Name: [name] MRN: [id number] DOB: [birth year] Today's Date: 10/17/2021   History of Present Illness 87 y/o female from [place] on 10/12/21 for GI bleed. Recently admitted after a fall with right humeral neck and greater tuberosity and right pubic ramus fx. Significant PMH: aneurysm, basal cell carcinoma, HTN, left heart failure, osteoporosis, pacemaker, polymyalgia, and compression fractures.    PT Comments    Pt is progressing with therapy, able to take pivotal steps today to and from [facility] to have a BM, however, she was unable to progress further after that due to limited tolerance of WB on R LE.  I would like to try a hemi walker next session.  PT will continue to follow acutely for safe mobility progression.  Recommendations for follow up therapy are one component of a multi-disciplinary discharge planning process, led by the attending physician.  Recommendations may be updated based on patient status, additional functional criteria and insurance authorization.  Follow Up Recommendations  Acute inpatient rehab (3hours/day)     Assistance Recommended at Discharge Frequent or constant Supervision/Assistance  Equipment Recommendations  BSC/3in1;Wheelchair (measurements PT);Wheelchair cushion (measurements PT)    Recommendations for Other Services Rehab consult     Precautions / Restrictions Precautions Precautions: Fall Precaution Comments: Significant bruising to face/neck R>L, High fall risk Required Braces or Orthoses: Sling (shoulder immobilizer) Restrictions RUE Weight Bearing: Non weight bearing RLE Weight Bearing: Weight bearing as tolerated LLE Weight Bearing: Weight bearing as tolerated     Mobility  Bed Mobility               General bed mobility comments: Pt is OOB in the recliner chair.    Transfers Overall transfer level: Needs assistance Equipment used: 1 person hand held assist Transfers: Sit to/from  Stand Sit to Stand: Mod assist;+2 physical assistance     Step pivot transfers: Mod assist;+2 physical assistance     General transfer comment: Two person mod assist to take steps from recliner to [facility] and back to recliner, wide BOS and painful stepping over R LE (d/t pelvic fx).    Ambulation/Gait               General Gait Details: pt too fatigued after toileting for attempts at gait.   Stairs             Wheelchair Mobility    Modified Rankin (Stroke Patients Only)       Balance Overall balance assessment: Needs assistance Sitting-balance support: Feet supported;Single extremity supported Sitting balance-Leahy Scale: Poor Sitting balance - Comments: needs L UE support to maintain sitting in recliner with back off of backrest Postural control: Posterior lean Standing balance support: Single extremity supported Standing balance-Leahy Scale: Poor Standing balance comment: does better with one hand supported on something solid like bed rail or back of locked chair.  May trial hemi walker next session.  She is just so painful when attempting to WB on her right foot.                            Cognition Arousal/Alertness: Awake/alert Behavior During Therapy: WFL for tasks assessed/performed Overall Cognitive Status: Within Functional Limits for tasks assessed                                          Exercises  General Comments        Pertinent Vitals/Pain Pain Assessment: Faces Faces Pain Scale: Hurts even more Pain Location: back, lower legs, R shoulder Pain Descriptors / Indicators: Discomfort;Grimacing;Guarding Pain Intervention(s): Limited activity within patient's tolerance;Repositioned    Home Living                          Prior Function            PT Goals (current goals can now be found in the care plan section) Progress towards PT goals: Progressing toward goals    Frequency    Min  3X/week      PT Plan Current plan remains appropriate    Co-evaluation              AM-PAC PT "6 Clicks" Mobility   Outcome Measure  Help needed turning from your back to your side while in a flat bed without using bedrails?: A Little Help needed moving from lying on your back to sitting on the side of a flat bed without using bedrails?: A Little Help needed moving to and from a bed to a chair (including a wheelchair)?: Total Help needed standing up from a chair using your arms (e.g., wheelchair or bedside chair)?: Total Help needed to walk in hospital room?: Total Help needed climbing 3-5 steps with a railing? : Total 6 Click Score: 10    End of Session Equipment Utilized During Treatment: Gait belt;Other (comment) (R shoulder immobilizer) Activity Tolerance: Patient limited by pain;Patient limited by fatigue Patient left: in chair;with call bell/phone within reach;with chair alarm set   PT Visit Diagnosis: Unsteadiness on feet (R26.81);Muscle weakness (generalized) (M62.81);Difficulty in walking, not elsewhere classified (R26.2);Pain Pain - Right/Left: Right Pain - part of body: Shoulder;Leg     Time: 1501-1530 PT Time Calculation (min) (ACUTE ONLY): 29 min  Charges:  $Therapeutic Activity: 23-37 mins                     Verdene Lennert, PT, DPT  Acute Rehabilitation Ortho Tech Supervisor [phone] pager [phone]) [phone] office

## 2021-10-17 NOTE — Progress Notes (Signed)
Bladder scan amount 107 ml.

## 2021-10-17 NOTE — Progress Notes (Signed)
[facility] PROGRESS NOTE    Progress Note  AILED DEFIBAUGH  NAT:[id number] DOB: 10/30/1932 DOA: 10/12/2021 PCP: Lawerance Cruel, MD     Brief Narrative:   [name] is an 87 y.o. female past medical history of essential hypertension, paroxysmal atrial fibrillation on Eliquis nonischemic cardiomyopathy sick sinus syndrome status post pacemaker, mitral stenosis repair hypothyroidism chronic kidney disease stage IIIa and iron deficiency anemia PE and are on chronic steroids recently discharged from the hospital on 10/06/2021 after sustaining a transverse proximal humeral head fracture and right pubic fracture along with a superior pubic rami fracture was brought into the hospital from rehab after the notice of nausea and constipation.  Labs were done that showed hyperkalemia and worsening renal function which initially improved with IV fluid, subsequently she started choking on her food and regurgitating applesauce. She started developing abdominal pain and passing clots, her hemoglobin was 6.2.  GI was consulted recommended no intervention unless she recurrently active bleeds severely they have signed off.  Assessment/Plan:   Acute lower GI bleeding/acute blood loss anemia: She is not a candidate for further anticoagulation due to her fall risk and multiple episodes of bloody bowel movements. Status post 1 unit of packed red blood cells. Had a burgundy bowel movement overnight, vitals are stable Continue to titrate antihypertensive medications as needed. Hemoglobin at 8.6 no further bloody bowel movement. Will monitor an additional 24 hours.  If no further bloody bowel movements hemoglobin remained stable can be transferred to inpatient rehab.  Leukocytosis: Likely reactive in nature has remained afebrile.  Hyperkalemia: She is got Lokelma twice, will repeat a basic metabolic panel today.  Dysphagia: She is at high risk of aspiration on last admission speech was consulted  recommended a dysphagia 2 with thin liquid diet.  Chronic kidney disease stage IIIb: With a baseline creatinine around 1.2, having a hard time voiding we will place a Foley can try a voiding trial in 2 to 3 days. Her creatinine continues to improve.  Essential hypertension: All antihypertensive medications were held at inpatient rehab due to borderline low blood pressure. Pressure continues to rise, continue Coreg Norvasc hydralazine restart home dose of lisinopril.  Paroxysmal atrial fibrillation: Continue to hold anticoagulation due to GI bleed. She may not be a candidate for anticoagulation in the future, I have discussed this with the patient and she agrees.  Chronic diastolic heart failure: Appears euvolemic continue strict I's and O's and daily weights.  Humerus and pelvic fracture secondary to fall: Orthopedic surgery saw her on her admission at the beginning of November recommended nonoperative management. Physical therapy and Occupational Therapy have been consulted continue oxycodone for pain control.   DVT prophylaxis: scd Family Communication:none Status is: Inpatient  Remains inpatient appropriate because: Acute GI bleed      Code Status:     Code Status Orders  (From admission, onward)           Start     Ordered   10/12/21 0735  Full code  Continuous        10/12/21 0735           Code Status History     Date Active Date Inactive Code Status Order ID Comments User Context   10/12/2021 0609 10/12/2021 0735 Full Code [id number]  [name], MD Inpatient   10/06/2021 1811 10/12/2021 0600 Full Code [id number]  Flora Lipps Inpatient   10/01/2021 1540 10/06/2021 1805 Full Code [id number]  Lequita Halt, MD  ED   03/28/2019 1809 03/31/2019 1709 Full Code [id number]  Arrien, Jimmy Picket, MD Inpatient         IV Access:   Peripheral IV   Procedures and diagnostic studies:   No results found.   Medical Consultants:    None.   Subjective:    [name] no further bloody bowel movements overnight.  Objective:    Vitals:   10/17/21 0308 10/17/21 0309 10/17/21 0310 10/17/21 0755  BP:      Pulse: 91 91 84   Resp: 19 15 18    Temp:    98.1 F (36.7 C)  TempSrc:    Oral  SpO2: 100% 100% 99%   Weight:       SpO2: 99 %   Intake/Output Summary (Last 24 hours) at 10/17/2021 0844 Last data filed at 10/16/2021 1505 Gross per 24 hour  Intake --  Output 1000 ml  Net -1000 ml    Filed Weights   10/14/21 0500 10/15/21 0500 10/16/21 0500  Weight: 48.9 kg 48.6 kg 48.8 kg    Exam: General exam: In no acute distress. Respiratory system: Good air movement and clear to auscultation. Cardiovascular system: S1 & S2 heard, RRR. No JVD. Gastrointestinal system: Abdomen is nondistended, soft and nontender.  Extremities: No pedal edema. Skin: No rashes, lesions or ulcers Psychiatry: Judgement and insight appear normal. Mood & affect appropriate.   Data Reviewed:    Labs: Basic Metabolic Panel: Recent Labs  Lab 10/12/21 1624 10/13/21 0617 10/14/21 0303 10/15/21 0545 10/16/21 0851  NA 134* 136 135 135 135  K 5.0 4.8 5.2* 4.6 4.6  CL 109 111 108 108 107  CO2 18* 19* 19* 21* 21*  GLUCOSE 196* 77 68* 82 93  BUN 41* 32* 31* 33* 32*  CREATININE 1.20* 1.01* 1.13* 1.15* 0.98  CALCIUM 7.4* 7.5* 7.3* 7.5* 7.9*    GFR Estimated Creatinine Clearance: 26.4 mL/min (by C-G formula based on SCr of 0.98 mg/dL). Liver Function Tests: Recent Labs  Lab 10/12/21 0245 10/13/21 0617 10/14/21 0303  AST 55* 52* 42*  ALT 66* 54* 46*  ALKPHOS 89 77 79  BILITOT 0.9 0.8 0.5  PROT 4.0* 4.3* 4.4*  ALBUMIN 1.9* 2.0* 2.0*    No results for input(s): LIPASE, AMYLASE in the last 168 hours. No results for input(s): AMMONIA in the last 168 hours. Coagulation profile Recent Labs  Lab 10/12/21 0245  INR 1.0    COVID-19 Labs  No results for input(s): DDIMER, FERRITIN, LDH, CRP in the last 72  hours.  Lab Results  Component Value Date   [place] NEGATIVE 09/30/2021    CBC: Recent Labs  Lab 10/12/21 0245 10/12/21 0506 10/13/21 0319 10/13/21 0617 10/14/21 0834 10/16/21 0851 10/17/21 0331  WBC 12.0*  --   --   --   --   --   --   NEUTROABS 8.2*  --   --   --   --   --   --   HGB 7.9*   < > 9.3* 9.3* 9.6* 9.3* 8.7*  HCT 24.9*   < > 27.6* 28.3* 29.3* 28.7* 27.0*  MCV 99.6  --   --   --   --   --   --   PLT 352  --   --   --   --   --   --    < > = values in this interval not displayed.    Cardiac Enzymes: No results for input(s): CKTOTAL, CKMB,  CKMBINDEX, TROPONINI in the last 168 hours. BNP (last 3 results) No results for input(s): PROBNP in the last 8760 hours. CBG: No results for input(s): GLUCAP in the last 168 hours.  D-Dimer: No results for input(s): DDIMER in the last 72 hours. Hgb A1c: No results for input(s): HGBA1C in the last 72 hours. Lipid Profile: No results for input(s): CHOL, HDL, LDLCALC, TRIG, CHOLHDL, LDLDIRECT in the last 72 hours. Thyroid function studies: No results for input(s): TSH, T4TOTAL, T3FREE, THYROIDAB in the last 72 hours.  Invalid input(s): FREET3  Anemia work up: No results for input(s): VITAMINB12, FOLATE, FERRITIN, TIBC, IRON, RETICCTPCT in the last 72 hours. Sepsis Labs: Recent Labs  Lab 10/12/21 0245  WBC 12.0*    Microbiology Recent Results (from the past 240 hour(s))  Urine Culture     Status: None   Collection Time: 10/09/21  1:29 PM   Specimen: Urine, Catheterized  Result Value Ref Range Status   Specimen Description URINE, CATHETERIZED  Final   Special Requests NONE  Final   Culture   Final    NO GROWTH Performed at [facility], [address], [place], [place] [address]    Report Status 10/11/2021 FINAL  Final     Medications:    amLODipine  5 mg Oral Daily   atorvastatin  40 mg Oral Daily   carvedilol  6.25 mg Oral BID WC   HYDROcodone-acetaminophen  1 tablet Oral TID AC    levothyroxine  75 mcg Oral Daily   methylPREDNISolone  4 mg Oral Daily   ondansetron  4 mg Oral Q12H   pantoprazole  40 mg Oral Daily   Continuous Infusions:    LOS: 5 days   Charlynne Cousins  [facility]  10/17/2021, 8:44 AM

## 2021-10-17 NOTE — Progress Notes (Signed)
Inpatient Rehab Admissions Coordinator:   I Do not a have a bed on CIR for this Pt. Today. Per MD note, she will be ready for CIR if no further bowel movements and hgb remains table for 24 hrs. Pt. Updated.  Clemens Catholic, [place], [place] Admissions Coordinator  [phone] ([place]) [phone] (office)

## 2021-10-18 ENCOUNTER — Encounter (HOSPITAL_COMMUNITY): Payer: Self-pay | Admitting: Physical Medicine & Rehabilitation

## 2021-10-18 ENCOUNTER — Other Ambulatory Visit: Payer: Self-pay

## 2021-10-18 ENCOUNTER — Inpatient Hospital Stay (HOSPITAL_COMMUNITY)
Admission: RE | Admit: 2021-10-18 | Discharge: 2021-10-31 | DRG: 560 | Disposition: A | Discharge status: Skilled Nursing Facility | Payer: Medicare Other | Source: Intra-hospital | Attending: Physical Medicine & Rehabilitation | Admitting: Physical Medicine & Rehabilitation

## 2021-10-18 DIAGNOSIS — M25521 Pain in right elbow: Secondary | ICD-10-CM | POA: Diagnosis not present

## 2021-10-18 DIAGNOSIS — K5900 Constipation, unspecified: Secondary | ICD-10-CM | POA: Diagnosis present

## 2021-10-18 DIAGNOSIS — Z800 Family history of malignant neoplasm of digestive organs: Secondary | ICD-10-CM

## 2021-10-18 DIAGNOSIS — R109 Unspecified abdominal pain: Secondary | ICD-10-CM

## 2021-10-18 DIAGNOSIS — W19XXXD Unspecified fall, subsequent encounter: Secondary | ICD-10-CM | POA: Diagnosis not present

## 2021-10-18 DIAGNOSIS — R110 Nausea: Secondary | ICD-10-CM | POA: Diagnosis present

## 2021-10-18 DIAGNOSIS — D509 Iron deficiency anemia, unspecified: Secondary | ICD-10-CM | POA: Diagnosis not present

## 2021-10-18 DIAGNOSIS — Z79899 Other long term (current) drug therapy: Secondary | ICD-10-CM | POA: Diagnosis not present

## 2021-10-18 DIAGNOSIS — L8990 Pressure ulcer of unspecified site, unspecified stage: Secondary | ICD-10-CM | POA: Diagnosis not present

## 2021-10-18 DIAGNOSIS — I5022 Chronic systolic (congestive) heart failure: Secondary | ICD-10-CM | POA: Diagnosis present

## 2021-10-18 DIAGNOSIS — E039 Hypothyroidism, unspecified: Secondary | ICD-10-CM | POA: Diagnosis present

## 2021-10-18 DIAGNOSIS — S3289XA Fracture of other parts of pelvis, initial encounter for closed fracture: Secondary | ICD-10-CM | POA: Diagnosis not present

## 2021-10-18 DIAGNOSIS — S32591D Other specified fracture of right pubis, subsequent encounter for fracture with routine healing: Secondary | ICD-10-CM

## 2021-10-18 DIAGNOSIS — Z743 Need for continuous supervision: Secondary | ICD-10-CM | POA: Diagnosis not present

## 2021-10-18 DIAGNOSIS — M810 Age-related osteoporosis without current pathological fracture: Secondary | ICD-10-CM | POA: Diagnosis present

## 2021-10-18 DIAGNOSIS — Z95810 Presence of automatic (implantable) cardiac defibrillator: Secondary | ICD-10-CM | POA: Diagnosis not present

## 2021-10-18 DIAGNOSIS — D72829 Elevated white blood cell count, unspecified: Secondary | ICD-10-CM | POA: Diagnosis not present

## 2021-10-18 DIAGNOSIS — N179 Acute kidney failure, unspecified: Secondary | ICD-10-CM | POA: Diagnosis not present

## 2021-10-18 DIAGNOSIS — D62 Acute posthemorrhagic anemia: Secondary | ICD-10-CM | POA: Diagnosis present

## 2021-10-18 DIAGNOSIS — I480 Paroxysmal atrial fibrillation: Secondary | ICD-10-CM | POA: Diagnosis present

## 2021-10-18 DIAGNOSIS — R197 Diarrhea, unspecified: Secondary | ICD-10-CM | POA: Diagnosis not present

## 2021-10-18 DIAGNOSIS — R1310 Dysphagia, unspecified: Secondary | ICD-10-CM | POA: Diagnosis not present

## 2021-10-18 DIAGNOSIS — Z803 Family history of malignant neoplasm of breast: Secondary | ICD-10-CM

## 2021-10-18 DIAGNOSIS — Z7982 Long term (current) use of aspirin: Secondary | ICD-10-CM

## 2021-10-18 DIAGNOSIS — S42201D Unspecified fracture of upper end of right humerus, subsequent encounter for fracture with routine healing: Secondary | ICD-10-CM | POA: Diagnosis not present

## 2021-10-18 DIAGNOSIS — S32301D Unspecified fracture of right ilium, subsequent encounter for fracture with routine healing: Secondary | ICD-10-CM | POA: Diagnosis not present

## 2021-10-18 DIAGNOSIS — S42201A Unspecified fracture of upper end of right humerus, initial encounter for closed fracture: Secondary | ICD-10-CM

## 2021-10-18 DIAGNOSIS — L89012 Pressure ulcer of right elbow, stage 2: Secondary | ICD-10-CM | POA: Diagnosis present

## 2021-10-18 DIAGNOSIS — I959 Hypotension, unspecified: Secondary | ICD-10-CM | POA: Diagnosis not present

## 2021-10-18 DIAGNOSIS — N183 Chronic kidney disease, stage 3 unspecified: Secondary | ICD-10-CM | POA: Diagnosis present

## 2021-10-18 DIAGNOSIS — Z7952 Long term (current) use of systemic steroids: Secondary | ICD-10-CM

## 2021-10-18 DIAGNOSIS — S32511D Fracture of superior rim of right pubis, subsequent encounter for fracture with routine healing: Secondary | ICD-10-CM | POA: Diagnosis not present

## 2021-10-18 DIAGNOSIS — E875 Hyperkalemia: Secondary | ICD-10-CM | POA: Diagnosis present

## 2021-10-18 DIAGNOSIS — I10 Essential (primary) hypertension: Secondary | ICD-10-CM

## 2021-10-18 DIAGNOSIS — S32501D Unspecified fracture of right pubis, subsequent encounter for fracture with routine healing: Secondary | ICD-10-CM | POA: Diagnosis not present

## 2021-10-18 DIAGNOSIS — E782 Mixed hyperlipidemia: Secondary | ICD-10-CM | POA: Diagnosis not present

## 2021-10-18 DIAGNOSIS — Z8719 Personal history of other diseases of the digestive system: Secondary | ICD-10-CM

## 2021-10-18 DIAGNOSIS — E871 Hypo-osmolality and hyponatremia: Secondary | ICD-10-CM | POA: Diagnosis not present

## 2021-10-18 DIAGNOSIS — Z20822 Contact with and (suspected) exposure to covid-19: Secondary | ICD-10-CM | POA: Diagnosis present

## 2021-10-18 DIAGNOSIS — M353 Polymyalgia rheumatica: Secondary | ICD-10-CM | POA: Diagnosis present

## 2021-10-18 DIAGNOSIS — E7800 Pure hypercholesterolemia, unspecified: Secondary | ICD-10-CM | POA: Diagnosis present

## 2021-10-18 DIAGNOSIS — S32501A Unspecified fracture of right pubis, initial encounter for closed fracture: Secondary | ICD-10-CM | POA: Diagnosis present

## 2021-10-18 DIAGNOSIS — I110 Hypertensive heart disease with heart failure: Secondary | ICD-10-CM | POA: Diagnosis not present

## 2021-10-18 DIAGNOSIS — I130 Hypertensive heart and chronic kidney disease with heart failure and stage 1 through stage 4 chronic kidney disease, or unspecified chronic kidney disease: Secondary | ICD-10-CM | POA: Diagnosis present

## 2021-10-18 DIAGNOSIS — Z85828 Personal history of other malignant neoplasm of skin: Secondary | ICD-10-CM

## 2021-10-18 DIAGNOSIS — Z823 Family history of stroke: Secondary | ICD-10-CM

## 2021-10-18 DIAGNOSIS — S42291A Other displaced fracture of upper end of right humerus, initial encounter for closed fracture: Secondary | ICD-10-CM | POA: Diagnosis not present

## 2021-10-18 DIAGNOSIS — R5381 Other malaise: Secondary | ICD-10-CM | POA: Diagnosis present

## 2021-10-18 DIAGNOSIS — I428 Other cardiomyopathies: Secondary | ICD-10-CM | POA: Diagnosis present

## 2021-10-18 DIAGNOSIS — K219 Gastro-esophageal reflux disease without esophagitis: Secondary | ICD-10-CM | POA: Diagnosis present

## 2021-10-18 DIAGNOSIS — I429 Cardiomyopathy, unspecified: Secondary | ICD-10-CM | POA: Diagnosis not present

## 2021-10-18 DIAGNOSIS — Z79890 Hormone replacement therapy (postmenopausal): Secondary | ICD-10-CM | POA: Diagnosis not present

## 2021-10-18 DIAGNOSIS — R7989 Other specified abnormal findings of blood chemistry: Secondary | ICD-10-CM | POA: Diagnosis not present

## 2021-10-18 DIAGNOSIS — S32301A Unspecified fracture of right ilium, initial encounter for closed fracture: Secondary | ICD-10-CM

## 2021-10-18 DIAGNOSIS — R339 Retention of urine, unspecified: Secondary | ICD-10-CM | POA: Diagnosis present

## 2021-10-18 DIAGNOSIS — Z90710 Acquired absence of both cervix and uterus: Secondary | ICD-10-CM

## 2021-10-18 DIAGNOSIS — D649 Anemia, unspecified: Secondary | ICD-10-CM | POA: Diagnosis not present

## 2021-10-18 DIAGNOSIS — I059 Rheumatic mitral valve disease, unspecified: Secondary | ICD-10-CM | POA: Diagnosis not present

## 2021-10-18 DIAGNOSIS — S329XXD Fracture of unspecified parts of lumbosacral spine and pelvis, subsequent encounter for fracture with routine healing: Secondary | ICD-10-CM | POA: Diagnosis not present

## 2021-10-18 DIAGNOSIS — S329XXA Fracture of unspecified parts of lumbosacral spine and pelvis, initial encounter for closed fracture: Secondary | ICD-10-CM | POA: Diagnosis present

## 2021-10-18 DIAGNOSIS — K922 Gastrointestinal hemorrhage, unspecified: Secondary | ICD-10-CM | POA: Diagnosis not present

## 2021-10-18 LAB — CBC
HCT: 25.7 % — ABNORMAL LOW (ref 36.0–46.0)
Hemoglobin: 8.5 g/dL — ABNORMAL LOW (ref 12.0–15.0)
MCH: 32.7 pg (ref 26.0–34.0)
MCHC: 33.1 g/dL (ref 30.0–36.0)
MCV: 98.8 fL (ref 80.0–100.0)
Platelets: 341 10*3/uL (ref 150–400)
RBC: 2.6 MIL/uL — ABNORMAL LOW (ref 3.87–5.11)
RDW: 15.5 % (ref 11.5–15.5)
WBC: 9.4 10*3/uL (ref 4.0–10.5)
nRBC: 0 % (ref 0.0–0.2)

## 2021-10-18 MED ORDER — METHYLPREDNISOLONE 4 MG PO TABS
4.0000 mg | ORAL_TABLET | Freq: Every day | ORAL | Status: DC
  Administered 2021-10-19 – 2021-10-31 (×13): 4 mg via ORAL
  Filled 2021-10-18 (×13): qty 1

## 2021-10-18 MED ORDER — GUAIFENESIN-DM 100-10 MG/5ML PO SYRP: 5.0000 mL | ORAL_SOLUTION | Freq: Four times a day (QID) | ORAL | Status: DC | PRN

## 2021-10-18 MED ORDER — AMLODIPINE BESYLATE 5 MG PO TABS
5.0000 mg | ORAL_TABLET | Freq: Every day | ORAL | Status: DC
  Administered 2021-10-19 – 2021-10-24 (×6): 5 mg via ORAL
  Filled 2021-10-18 (×7): qty 1

## 2021-10-18 MED ORDER — HYDROCODONE-ACETAMINOPHEN 10-325 MG PO TABS
1.0000 | ORAL_TABLET | Freq: Three times a day (TID) | ORAL | Status: DC
  Administered 2021-10-19 – 2021-10-31 (×37): 1 via ORAL
  Filled 2021-10-18 (×37): qty 1

## 2021-10-18 MED ORDER — PROCHLORPERAZINE MALEATE 5 MG PO TABS
5.0000 mg | ORAL_TABLET | Freq: Four times a day (QID) | ORAL | Status: DC | PRN
  Administered 2021-10-21 – 2021-10-30 (×2): 10 mg via ORAL
  Filled 2021-10-18 (×3): qty 2

## 2021-10-18 MED ORDER — HYDRALAZINE HCL 25 MG PO TABS
25.0000 mg | ORAL_TABLET | Freq: Four times a day (QID) | ORAL | Status: DC | PRN
  Administered 2021-10-21: 25 mg via ORAL
  Filled 2021-10-18: qty 1

## 2021-10-18 MED ORDER — ONDANSETRON HCL 4 MG PO TABS
4.0000 mg | ORAL_TABLET | Freq: Two times a day (BID) | ORAL | 0 refills | Status: AC
Start: 2021-10-18 — End: ?

## 2021-10-18 MED ORDER — POLYETHYLENE GLYCOL 3350 17 G PO PACK: 17.0000 g | PACK | Freq: Every day | ORAL | Status: DC | PRN

## 2021-10-18 MED ORDER — DIPHENHYDRAMINE HCL 12.5 MG/5ML PO ELIX: 12.5000 mg | ORAL_SOLUTION | Freq: Four times a day (QID) | ORAL | Status: DC | PRN

## 2021-10-18 MED ORDER — ALBUTEROL SULFATE (2.5 MG/3ML) 0.083% IN NEBU: 2.5000 mg | INHALATION_SOLUTION | Freq: Four times a day (QID) | RESPIRATORY_TRACT | Status: DC | PRN

## 2021-10-18 MED ORDER — TRAZODONE HCL 50 MG PO TABS
25.0000 mg | ORAL_TABLET | Freq: Every evening | ORAL | Status: DC | PRN
  Administered 2021-10-18 – 2021-10-28 (×4): 50 mg via ORAL
  Filled 2021-10-18 (×5): qty 1

## 2021-10-18 MED ORDER — POLYETHYLENE GLYCOL 3350 17 G PO PACK
17.0000 g | PACK | Freq: Every day | ORAL | Status: DC
  Administered 2021-10-19: 17 g via ORAL
  Filled 2021-10-18 (×5): qty 1

## 2021-10-18 MED ORDER — SIMETHICONE 40 MG/0.6ML PO SUSP
80.0000 mg | Freq: Four times a day (QID) | ORAL | Status: DC
  Administered 2021-10-19 – 2021-10-31 (×45): 80 mg via ORAL
  Filled 2021-10-18 (×47): qty 1.2

## 2021-10-18 MED ORDER — PROCHLORPERAZINE 25 MG RE SUPP: 12.5000 mg | Freq: Four times a day (QID) | RECTAL | Status: DC | PRN

## 2021-10-18 MED ORDER — ONDANSETRON HCL 4 MG PO TABS
4.0000 mg | ORAL_TABLET | Freq: Two times a day (BID) | ORAL | Status: DC
  Administered 2021-10-18 – 2021-10-31 (×26): 4 mg via ORAL
  Filled 2021-10-18 (×26): qty 1

## 2021-10-18 MED ORDER — CARVEDILOL 6.25 MG PO TABS
6.2500 mg | ORAL_TABLET | Freq: Two times a day (BID) | ORAL | Status: DC
  Administered 2021-10-19 – 2021-10-31 (×25): 6.25 mg via ORAL
  Filled 2021-10-18 (×26): qty 1

## 2021-10-18 MED ORDER — MELATONIN 5 MG PO TABS
5.0000 mg | ORAL_TABLET | Freq: Every evening | ORAL | Status: DC | PRN
  Administered 2021-10-19 – 2021-10-22 (×2): 5 mg via ORAL
  Filled 2021-10-18 (×2): qty 1

## 2021-10-18 MED ORDER — SENNOSIDES-DOCUSATE SODIUM 8.6-50 MG PO TABS
2.0000 | ORAL_TABLET | Freq: Every day | ORAL | Status: DC
  Administered 2021-10-18 – 2021-10-21 (×4): 2 via ORAL
  Filled 2021-10-18 (×3): qty 2

## 2021-10-18 MED ORDER — BISACODYL 10 MG RE SUPP: 10.0000 mg | Freq: Every day | RECTAL | Status: DC | PRN

## 2021-10-18 MED ORDER — CHLORHEXIDINE GLUCONATE CLOTH 2 % EX PADS
6.0000 | MEDICATED_PAD | Freq: Every day | CUTANEOUS | Status: DC
  Administered 2021-10-19 – 2021-10-23 (×5): 6 via TOPICAL

## 2021-10-18 MED ORDER — ATORVASTATIN CALCIUM 40 MG PO TABS
40.0000 mg | ORAL_TABLET | Freq: Every day | ORAL | Status: DC
  Administered 2021-10-19 – 2021-10-31 (×13): 40 mg via ORAL
  Filled 2021-10-18 (×13): qty 1

## 2021-10-18 MED ORDER — PANTOPRAZOLE SODIUM 40 MG PO TBEC
40.0000 mg | DELAYED_RELEASE_TABLET | Freq: Every day | ORAL | Status: DC
  Administered 2021-10-19 – 2021-10-31 (×13): 40 mg via ORAL
  Filled 2021-10-18 (×13): qty 1

## 2021-10-18 MED ORDER — PROCHLORPERAZINE EDISYLATE 10 MG/2ML IJ SOLN: 5.0000 mg | Freq: Four times a day (QID) | INTRAMUSCULAR | Status: DC | PRN

## 2021-10-18 MED ORDER — FLEET ENEMA 7-19 GM/118ML RE ENEM: 1.0000 | ENEMA | Freq: Once | RECTAL | Status: DC | PRN

## 2021-10-18 MED ORDER — OXYCODONE HCL 5 MG PO TABS
5.0000 mg | ORAL_TABLET | Freq: Four times a day (QID) | ORAL | Status: DC | PRN
  Administered 2021-10-18 – 2021-10-31 (×17): 5 mg via ORAL
  Filled 2021-10-18 (×17): qty 1

## 2021-10-18 MED ORDER — AMLODIPINE BESYLATE 5 MG PO TABS: 5.0000 mg | ORAL_TABLET | Freq: Every day | ORAL | Status: DC

## 2021-10-18 MED ORDER — LISINOPRIL 10 MG PO TABS
10.0000 mg | ORAL_TABLET | Freq: Every day | ORAL | Status: DC
  Administered 2021-10-19 – 2021-10-20 (×2): 10 mg via ORAL
  Filled 2021-10-18 (×2): qty 1

## 2021-10-18 MED ORDER — ACETAMINOPHEN 325 MG PO TABS
325.0000 mg | ORAL_TABLET | ORAL | Status: DC | PRN
  Administered 2021-10-18 – 2021-10-21 (×2): 650 mg via ORAL
  Filled 2021-10-18 (×2): qty 2

## 2021-10-18 MED ORDER — PANTOPRAZOLE SODIUM 40 MG PO TBEC
40.0000 mg | DELAYED_RELEASE_TABLET | Freq: Every day | ORAL | Status: AC
Start: 2021-10-19 — End: ?

## 2021-10-18 MED ORDER — SIMETHICONE 80 MG PO CHEW
80.0000 mg | CHEWABLE_TABLET | Freq: Four times a day (QID) | ORAL | Status: DC | PRN
  Administered 2021-10-25 – 2021-10-31 (×2): 80 mg via ORAL
  Filled 2021-10-18: qty 1

## 2021-10-18 MED ORDER — ALUM & MAG HYDROXIDE-SIMETH 200-200-20 MG/5ML PO SUSP
30.0000 mL | ORAL | Status: DC | PRN
  Administered 2021-10-31: 30 mL via ORAL
  Filled 2021-10-18: qty 30

## 2021-10-18 MED ORDER — LEVOTHYROXINE SODIUM 75 MCG PO TABS
75.0000 ug | ORAL_TABLET | Freq: Every day | ORAL | Status: DC
  Administered 2021-10-19 – 2021-10-31 (×13): 75 ug via ORAL
  Filled 2021-10-18 (×13): qty 1

## 2021-10-18 NOTE — Progress Notes (Signed)
Inpatient Rehab Admissions Coordinator:  ° °I have a bed for this Pt. On CIR today. RN may call report to [phone] after 12pm. ° °Andoni Busch, MS, CCC-SLP °Rehab Admissions Coordinator  °[phone] (celll) °[phone] (office) ° °

## 2021-10-18 NOTE — TOC Transition Note (Signed)
Transition of Care (TOC) - CM/SW Discharge Note Marvetta Gibbons RN,BSN Transitions of Care Unit 4NP (Non Trauma)- RN Case Manager See Treatment Team for direct Phone #    Patient Details  Name: [name] MRN: [id number] Date of Birth: 1932/05/26  Transition of Care [facility]) CM/SW Contact:  Dawayne Patricia, RN Phone Number: 10/18/2021, 3:04 PM   Clinical Narrative:    Pt was re-admitted from CIR with GIB- pt has been cleared by MD as stable to return to [facility] CIR for further rehab.  CIR has bed available today for readmission to rehab program.  Plan to transition back to [facility]- INPT rehab later today.    Final next level of care: IP Rehab Facility Barriers to Discharge: Barriers Resolved   Patient Goals and CMS Choice Patient states their goals for this hospitalization and ongoing recovery are:: return to [place] rehab CMS Medicare.gov Compare Post Acute Care list provided to:: Patient Choice offered to / list presented to : Patient, Adult Children  Discharge Placement               [facility]- INPT rehab        Discharge Plan and Services     Post Acute Care Choice: IP Rehab                               Social Determinants of Health (SDOH) Interventions     Readmission Risk Interventions Readmission Risk Prevention Plan 10/18/2021  Transportation Screening Complete  HRI or Home Care Consult Complete  Social Work Consult for [place] Planning/Counseling Complete  Palliative Care Screening Not Applicable  Medication Review Press photographer) Complete  Some recent data might be hidden

## 2021-10-18 NOTE — H&P (Incomplete)
Physical Medicine and Rehabilitation Admission H&P    DX:[id number] deficits due to fall with fractures and debility   HPI: [name] is an 87 year old female with history of HTN, SSS/PAF, NICM, CKD III, PMR-chronic steriods who was originally admitted to [facility] on 10/12/21 with AKI, anemia, fall and subsequent right proximal humerus fracture and right pelvic fractures.  She was IV antibiotics for UTI and concerns of sepsis, IV fluids for hydration, Lokelma for hyperkalemia, Foley placement for urinary retention as well as 1 unit PRBC for anemia.  She was transferred to this CIR on 10/06/2021 for intensive rehab program to consist of PT and OT at least three hours per day.  She has had issues with nausea as well as constipation requiring adjustment of bowel program.  She did have worsening of renal status requiring IV fluids as well as recurrent hyperkalemia requiring Lokelma.  Foley was kept in place due to urinary retention and immobility issues.  Pain control is improving with scheduled narcotics.  She did develop rectal bleeding on early a.m. of 11/17 followed by drop in H&H to 6.2.  She was typed and crossed for 2 units PRBC and GI was consulted for input.    On 11/17, She was transferred to acute floor for work-up and closer monitoring.  Dr. Paulita Fujita recommended serial CBC and supportive care with hydration and monitoring as patient was not a candidate for colonoscopy due to multiple comorbidities.  He also recommended tagged RBC study was a CTA if patient had rebleeding.  CT abdomen pelvis was negative for acute changes.  Foley catheter was removed and renal status was monitored.  As H&H was stable and no recurrent rectal bleeding noted, GI signed off.  Palliative care consulted to discuss goals of care and patient elected on full scope of care.  Zofran was scheduled twice daily to help with nausea in addition to hydrocodone 3 times daily for pain management.  She has had recurrent hyperkalemia  requiring additional dose of Lokelma.  Therapy was resumed and patient continues to be limited by weakness, RU E weight bearing restrictions, pain, fatigue and balance deficits affecting mobility as well as ADLs.  CIR recommended due to functional decline   Review of Systems  Constitutional:  Negative for chills and fever.  HENT:  Negative for hearing loss.   Eyes:  Negative for blurred vision and double vision.  Respiratory:  Negative for cough and shortness of breath.   Cardiovascular:  Negative for chest pain and leg swelling.  Gastrointestinal:  Positive for constipation and nausea. Negative for blood in stool.  Musculoskeletal:  Positive for myalgias.  Neurological:  Negative for dizziness and headaches.  Psychiatric/Behavioral:  The patient does not have insomnia.     Past Medical History:  Diagnosis Date   Allergic rhinitis    Anemia    iron deficient   Aneurysm (HCC)    Anxiety    Basal cell carcinoma    Chronic anemia    Chronic low back pain    Colon polyps    GERD (gastroesophageal reflux disease)    Hiatal hernia    HTN (hypertension)    Hypercholesteremia    Hyperlipidemia    Hypothyroidism    Insomnia    Left heart failure (HCC)    Osteoporosis    S/P forteo treatment as of 02/2009   Pacemaker    Polymyalgia ([place])    rheumatica-steroids per Rheum   Systolic heart failure     Past  Surgical History:  Procedure Laterality Date   ABDOMINAL HYSTERECTOMY     ANGIOPLASTY     BACK SURGERY     CARDIAC CATHETERIZATION  10/16/06   CATARACT EXTRACTION     defibulator implant     EP IMPLANTABLE DEVICE N/A 04/10/2016   Procedure: BiV Pacemaker Insertion CRT-P;  Surgeon: Evans Lance, MD;  Location: [place] [facility];  Service: Cardiovascular;  Laterality: N/A;   FOOT SURGERY     MITRAL VALVE REPAIR     skin cancer removal     THYROIDECTOMY     TONSILLECTOMY      Family History  Problem Relation Age of Onset   Stroke Mother        in her 35's   Cancer  Father 57       secondary   Colon cancer Brother    Colon cancer Sister    Breast cancer Sister     Social History:.  Lives alone and was independent prior to admission.  She reports that she has never smoked. She has never used smokeless tobacco. She reports that she does not drink alcohol and does not use drugs.    Allergies  Allergen Reactions   Pertussis Vaccines Swelling   Sulfa Antibiotics Nausea Only   Amoxicillin Rash and Other (See Comments)    Has patient had a PCN reaction causing immediate rash, facial/tongue/throat swelling, SOB or lightheadedness with hypotension: yes Has patient had a PCN reaction causing severe rash involving mucus membranes or skin necrosis: no Has patient had a PCN reaction that required hospitalization no Has patient had a PCN reaction occurring within the last 10 years: yes If all of the above answers are "NO", then may proceed with Cephalosporin use.     Medications Prior to Admission  Medication Sig Dispense Refill   allopurinol (ZYLOPRIM) 100 MG tablet Take 100 mg by mouth daily.     amLODipine (NORVASC) 2.5 MG tablet Take 2.5 mg by mouth daily.     aspirin EC 81 MG tablet Take 81 mg by mouth daily. Swallow whole.     atorvastatin (LIPITOR) 40 MG tablet Take 1 tablet (40 mg total) by mouth daily.     carvedilol (COREG) 6.25 MG tablet Take 1 tablet (6.25 mg total) by mouth 2 (two) times daily with a meal.     heparin 5000 UNIT/ML injection Inject 1 mL (5,000 Units total) into the skin every 12 (twelve) hours. 1 mL    hydrALAZINE (APRESOLINE) 25 MG tablet Take 1 tablet (25 mg total) by mouth every 6 (six) hours as needed (SBP>150 or DBP>100).     levothyroxine (SYNTHROID, LEVOTHROID) 75 MCG tablet Take 1 tablet (75 mcg total) by mouth daily. 90 tablet 1   lisinopril (ZESTRIL) 10 MG tablet Take 10 mg by mouth daily.     methylPREDNISolone (MEDROL) 2 MG tablet Take 1 tablet (2 mg total) by mouth daily.     omeprazole (PRILOSEC OTC) 20 MG tablet  Take 20 mg by mouth 2 (two) times daily as needed (acid reflux).     oxyCODONE (OXY IR/ROXICODONE) 5 MG immediate release tablet Take 1 tablet (5 mg total) by mouth every 6 (six) hours as needed for severe pain. 30 tablet 0   polyethylene glycol (MIRALAX / GLYCOLAX) 17 g packet Take 17 g by mouth daily. 14 each 0    Drug Regimen Review { DRUG REGIMEN [id number]}  Home: Home Living Family/patient expects to be discharged to:: Inpatient rehab Living Arrangements: Alone  Available Help at Discharge: Family, Available 24 hours/day Type of Home: House Home Access: Stairs to enter [employer] of Steps: 2 Entrance Stairs-Rails: Right Home Layout: One level Bathroom Shower/Tub: Multimedia programmer: Standard Bathroom Accessibility: Yes Home Equipment: Conservation officer, nature (2 wheels)  Lives With: Alone   Functional History: Prior Function Prior Level of Function : Needs assist Physical Assist : ADLs (physical), Mobility (physical) Mobility (physical): Transfers, Bed mobility, Gait ADLs (physical): Bathing, Dressing, Grooming, Toileting Mobility Comments: Prior to last admission, pt was independent with mobility and not using any AD. At [facility], requires mod-maxA for mobility. ADLs Comments: Prior to last admission, pt was independent and driving. At [facility], requires maxA for ADLs  Functional Status:  Mobility: Bed Mobility Overal bed mobility: Needs Assistance Bed Mobility: Supine to Sit Supine to sit: Min assist Sit to supine: Max assist General bed mobility comments: Pt is OOB in the recliner chair. Transfers Overall transfer level: Needs assistance Equipment used: 1 person hand held assist Transfers: Sit to/from Stand Sit to Stand: Mod assist, +2 physical assistance Bed to/from chair/wheelchair/BSC transfer type:: Step pivot Step pivot transfers: Mod assist, +2 physical assistance General transfer comment: Two person mod assist to take steps from recliner to [facility] and  back to recliner, wide BOS and painful stepping over R LE (d/t pelvic fx). Ambulation/Gait General Gait Details: pt too fatigued after toileting for attempts at gait.    ADL: ADL Overall ADL's : Needs assistance/impaired Eating/Feeding: Minimal assistance, Sitting Grooming: Minimal assistance, Sitting Upper Body Bathing: Moderate assistance, Sitting Upper Body Bathing Details (indicate cue type and reason): Providing cues and education for compensatory techniques. Pt able to wash under R axillary area with assistance to maintain limited shoulder movement. Pt washing L axillary area by reaching LUE under as cued. Assist for washing her back. Lower Body Bathing: Maximal assistance, Sit to/from stand Lower Body Bathing Details (indicate cue type and reason): Pt washing anterior peri area while seated and leaning back. Assistance to wash posterior peri area while in standing. Cues for weight shifting forward to toes to counter act posterior lean. Min A for power up into standing Upper Body Dressing : Maximal assistance, Sitting Upper Body Dressing Details (indicate cue type and reason): Max A for donning new gown and donning sling. Lower Body Dressing: Maximal assistance Lower Body Dressing Details (indicate cue type and reason): donned socks Toilet Transfer: Moderate assistance, Stand-pivot (simulated to recliner) Toilet Transfer Details (indicate cue type and reason): Mod A for balance and posterior lean Toileting- Clothing Manipulation and Hygiene: Maximal assistance, +2 for physical assistance, Sit to/from stand Toileting - Clothing Manipulation Details (indicate cue type and reason): Max A for peri care. Pt with moderate blood in stool. Second person to maintain balance Functional mobility during ADLs: Moderate assistance (stand pivot only) General ADL Comments: Pt performing UB bathing, LB bathing, and dressing. Stand pivot to recliner. Providing cues for weight shift forward in standing -  and pt demonstrating improved balance with good rehab potentional. Fatigued at end of session but still motivated to practice with PT  Cognition: Cognition Overall Cognitive Status: Within Functional Limits for tasks assessed Arousal/Alertness: Awake/alert Orientation Level: Oriented X4 Cognition Arousal/Alertness: Awake/alert Behavior During Therapy: WFL for tasks assessed/performed Overall Cognitive Status: Within Functional Limits for tasks assessed General Comments: Moments with fear of falling but benefits from calming cues. Very motivated  Physical Exam: Blood pressure (!) 119/47, pulse 80, temperature 98.1 F (36.7 C), temperature source Oral, resp. rate 14, weight 49.8 kg,  SpO2 99 %. Physical Exam  Results for orders placed or performed during the hospital encounter of 10/12/21 (from the past 48 hour(s))  Hemoglobin and hematocrit, blood     Status: Abnormal   Collection Time: 10/17/21  3:31 AM  Result Value Ref Range   Hemoglobin 8.7 (L) 12.0 - 15.0 g/dL   HCT 27.0 (L) 36.0 - 46.0 %    Comment: Performed at [facility], [address] [place], [place] [address]  CBC     Status: Abnormal   Collection Time: 10/18/21  9:21 AM  Result Value Ref Range   WBC 9.4 4.0 - 10.5 K/uL   RBC 2.60 (L) 3.87 - 5.11 MIL/uL   Hemoglobin 8.5 (L) 12.0 - 15.0 g/dL   HCT 25.7 (L) 36.0 - 46.0 %   MCV 98.8 80.0 - 100.0 fL   MCH 32.7 26.0 - 34.0 pg   MCHC 33.1 30.0 - 36.0 g/dL   RDW 15.5 11.5 - 15.5 %   Platelets 341 150 - 400 K/uL   nRBC 0.0 0.0 - 0.2 %    Comment: Performed at [facility], [place] [address], [place], [place] [address]   No results found.     Medical Problem List and Plan: 1. Functional deficits secondary to ***  -patient may *** shower  -ELOS/Goals: *** 2.  Antithrombotics: -DVT/anticoagulation:  Mechanical: Sequential compression devices, below knee Bilateral lower extremities.   -antiplatelet therapy: N/A 3. Pain Management: Hydrocodone 3 times  daily with as needed oxycodone for breakthrough pain 4. Mood: LCSW to follow for evaluation and support  -antipsychotic agents: N/AA 5. Neuropsych: This patient is capable of making decisions on her own behalf. 6. Skin/Wound Care: Routine pressure-relief measures. 7. Fluids/Electrolytes/Nutrition: Monitor I/O. Check CMET in am. 8. PAF: Off eliquis. Monitor Heart rate TID on coreg BID. 9. HTN: Monitor BP TID-on Norvasc, Coreg and lisinopril 10. Recurrent hyperkalemia: Likely due to ACE--Discontinue Lisinopril?? 11. PMR: Followed by Dr.Beekman and has been managed with medrol 4 mg daily.  12. Right humerus fracture: NWB RUE with sling at all times.  --May doff sling for ADLs with right forearm close to body. 13. Urinary retention: Foley replaced this am due to retention needing caths for 400- 800 cc. --discontinue Friday after constipation addressed.   14. Constipation: Last BM documented on 11/21 as smal and red. Only smears since then --Will start on laxatives to prevent recurrent constipation/bleeding.   15. Nausea: Continue zofran BID but burping/belching with dyspepsia --will schedule simethicone qid.  --reflux precautions.      ***  Bary Leriche, PA-C 10/18/2021

## 2021-10-18 NOTE — H&P (Addendum)
Physical Medicine and Rehabilitation Admission H&P     PJ:[id number] deficits due to fall with fractures and debility     HPI: [name] is an 87 year old female with history of HTN, SSS/PAF, NICM, CKD III, PMR-chronic steriods who was originally admitted to [facility] on 10/01/21 with AKI, anemia, fall and subsequent right proximal humerus fracture and right pelvic fractures.  She was IV antibiotics for UTI and concerns of sepsis, IV fluids for hydration, Lokelma for hyperkalemia, Foley placement for urinary retention as well as 1 unit PRBC for anemia.  She was transferred to this CIR on 10/06/2021 for intensive rehab program to consist of PT and OT at least three hours per day.  She has had issues with nausea as well as constipation requiring adjustment of bowel program.  She did have worsening of renal status requiring IV fluids as well as recurrent hyperkalemia requiring Lokelma.  Foley was kept in place due to urinary retention and immobility issues.  Pain control is improving with scheduled narcotics.  She did develop rectal bleeding on early a.m. of 11/17 followed by drop in H&H to 6.2.  She was typed and crossed for 2 units PRBC and GI was consulted for input.     On 11/17, She was transferred to acute floor for work-up and closer monitoring.  Dr. Paulita Fujita recommended serial CBC and supportive care with hydration and monitoring as patient was not a candidate for colonoscopy due to multiple comorbidities.  He also recommended tagged RBC study was a CTA if patient had rebleeding.  CT abdomen pelvis was negative for acute changes.  Foley catheter was removed and renal status was monitored.  As H&H was stable and no recurrent rectal bleeding noted, GI signed off.  Palliative care consulted to discuss goals of care and patient elected on full scope of care.  Zofran was scheduled twice daily to help with nausea in addition to hydrocodone 3 times daily for pain management.  She has had recurrent  hyperkalemia requiring additional dose of Lokelma.  Therapy was resumed and patient continues to be limited by weakness, RU E weight bearing restrictions, pain, fatigue and balance deficits affecting mobility as well as ADLs.  CIR recommended due to functional decline     ROS         Past Medical History:  Diagnosis Date   Allergic rhinitis     Anemia      iron deficient   Aneurysm (HCC)     Anxiety     Basal cell carcinoma     Chronic anemia     Chronic low back pain     Colon polyps     GERD (gastroesophageal reflux disease)     Hiatal hernia     HTN (hypertension)     Hypercholesteremia     Hyperlipidemia     Hypothyroidism     Insomnia     Left heart failure ([place])     Osteoporosis      S/P forteo treatment as of 02/2009   Pacemaker     Polymyalgia ([place])      rheumatica-steroids per Rheum   Systolic heart failure             Past Surgical History:  Procedure Laterality Date   ABDOMINAL HYSTERECTOMY       ANGIOPLASTY       BACK SURGERY       CARDIAC CATHETERIZATION   10/16/06   CATARACT EXTRACTION       defibulator  implant       EP IMPLANTABLE DEVICE N/A 04/10/2016    Procedure: BiV Pacemaker Insertion CRT-P;  Surgeon: Evans Lance, MD;  Location: [place] [facility];  Service: Cardiovascular;  Laterality: N/A;   FOOT SURGERY       MITRAL VALVE REPAIR       skin cancer removal       THYROIDECTOMY       TONSILLECTOMY               Family History  Problem Relation Age of Onset   Stroke Mother          in her 64's   Cancer Father 65        secondary   Colon cancer Brother     Colon cancer Sister     Breast cancer Sister        Social History:.  Lives alone and was independent prior to admission.  She reports that she has never smoked. She has never used smokeless tobacco. She reports that she does not drink alcohol and does not use drugs.          Allergies  Allergen Reactions   Pertussis Vaccines Swelling   Sulfa Antibiotics Nausea Only    Amoxicillin Rash and Other (See Comments)      Has patient had a PCN reaction causing immediate rash, facial/tongue/throat swelling, SOB or lightheadedness with hypotension: yes Has patient had a PCN reaction causing severe rash involving mucus membranes or skin necrosis: no Has patient had a PCN reaction that required hospitalization no Has patient had a PCN reaction occurring within the last 10 years: yes If all of the above answers are "NO", then may proceed with Cephalosporin use.              Medications Prior to Admission  Medication Sig Dispense Refill   allopurinol (ZYLOPRIM) 100 MG tablet Take 100 mg by mouth daily.       amLODipine (NORVASC) 2.5 MG tablet Take 2.5 mg by mouth daily.       aspirin EC 81 MG tablet Take 81 mg by mouth daily. Swallow whole.       atorvastatin (LIPITOR) 40 MG tablet Take 1 tablet (40 mg total) by mouth daily.       carvedilol (COREG) 6.25 MG tablet Take 1 tablet (6.25 mg total) by mouth 2 (two) times daily with a meal.       heparin 5000 UNIT/ML injection Inject 1 mL (5,000 Units total) into the skin every 12 (twelve) hours. 1 mL     hydrALAZINE (APRESOLINE) 25 MG tablet Take 1 tablet (25 mg total) by mouth every 6 (six) hours as needed (SBP>150 or DBP>100).       levothyroxine (SYNTHROID, LEVOTHROID) 75 MCG tablet Take 1 tablet (75 mcg total) by mouth daily. 90 tablet 1   lisinopril (ZESTRIL) 10 MG tablet Take 10 mg by mouth daily.       methylPREDNISolone (MEDROL) 2 MG tablet Take 1 tablet (2 mg total) by mouth daily.       omeprazole (PRILOSEC OTC) 20 MG tablet Take 20 mg by mouth 2 (two) times daily as needed (acid reflux).       oxyCODONE (OXY IR/ROXICODONE) 5 MG immediate release tablet Take 1 tablet (5 mg total) by mouth every 6 (six) hours as needed for severe pain. 30 tablet 0   polyethylene glycol (MIRALAX / GLYCOLAX) 17 g packet Take 17 g by mouth daily. 14 each 0  Drug Regimen Review  Drug regimen was reviewed and remains appropriate  with no significant issues identified   Home: Home Living Family/patient expects to be discharged to:: Inpatient rehab Living Arrangements: Alone Available Help at Discharge: Family, Available 24 hours/day Type of Home: House Home Access: Stairs to enter [employer] of Steps: 2 Entrance Stairs-Rails: Right Home Layout: One level Bathroom Shower/Tub: Multimedia programmer: Standard Bathroom Accessibility: Yes Home Equipment: Conservation officer, nature (2 wheels)  Lives With: Alone   Functional History: Prior Function Prior Level of Function : Needs assist Physical Assist : ADLs (physical), Mobility (physical) Mobility (physical): Transfers, Bed mobility, Gait ADLs (physical): Bathing, Dressing, Grooming, Toileting Mobility Comments: Prior to last admission, pt was independent with mobility and not using any AD. At [facility], requires mod-maxA for mobility. ADLs Comments: Prior to last admission, pt was independent and driving. At [facility], requires maxA for ADLs   Functional Status:  Mobility: Bed Mobility Overal bed mobility: Needs Assistance Bed Mobility: Supine to Sit Supine to sit: Min assist Sit to supine: Max assist General bed mobility comments: Pt is OOB in the recliner chair. Transfers Overall transfer level: Needs assistance Equipment used: 1 person hand held assist Transfers: Sit to/from Stand Sit to Stand: Mod assist, +2 physical assistance Bed to/from chair/wheelchair/BSC transfer type:: Step pivot Step pivot transfers: Mod assist, +2 physical assistance General transfer comment: Two person mod assist to take steps from recliner to [facility] and back to recliner, wide BOS and painful stepping over R LE (d/t pelvic fx). Ambulation/Gait General Gait Details: pt too fatigued after toileting for attempts at gait.   ADL: ADL Overall ADL's : Needs assistance/impaired Eating/Feeding: Minimal assistance, Sitting Grooming: Minimal assistance, Sitting Upper Body Bathing:  Moderate assistance, Sitting Upper Body Bathing Details (indicate cue type and reason): Providing cues and education for compensatory techniques. Pt able to wash under R axillary area with assistance to maintain limited shoulder movement. Pt washing L axillary area by reaching LUE under as cued. Assist for washing her back. Lower Body Bathing: Maximal assistance, Sit to/from stand Lower Body Bathing Details (indicate cue type and reason): Pt washing anterior peri area while seated and leaning back. Assistance to wash posterior peri area while in standing. Cues for weight shifting forward to toes to counter act posterior lean. Min A for power up into standing Upper Body Dressing : Maximal assistance, Sitting Upper Body Dressing Details (indicate cue type and reason): Max A for donning new gown and donning sling. Lower Body Dressing: Maximal assistance Lower Body Dressing Details (indicate cue type and reason): donned socks Toilet Transfer: Moderate assistance, Stand-pivot (simulated to recliner) Toilet Transfer Details (indicate cue type and reason): Mod A for balance and posterior lean Toileting- Clothing Manipulation and Hygiene: Maximal assistance, +2 for physical assistance, Sit to/from stand Toileting - Clothing Manipulation Details (indicate cue type and reason): Max A for peri care. Pt with moderate blood in stool. Second person to maintain balance Functional mobility during ADLs: Moderate assistance (stand pivot only) General ADL Comments: Pt performing UB bathing, LB bathing, and dressing. Stand pivot to recliner. Providing cues for weight shift forward in standing - and pt demonstrating improved balance with good rehab potentional. Fatigued at end of session but still motivated to practice with PT   Cognition: Cognition Overall Cognitive Status: Within Functional Limits for tasks assessed Arousal/Alertness: Awake/alert Orientation Level: Oriented X4 Cognition Arousal/Alertness:  Awake/alert Behavior During Therapy: WFL for tasks assessed/performed Overall Cognitive Status: Within Functional Limits for tasks assessed General Comments: Moments  with fear of falling but benefits from calming cues. Very motivated   Physical Exam: Blood pressure (!) 119/47, pulse 80, temperature 98.1 F (36.7 C), temperature source Oral, resp. rate 14, weight 49.8 kg, SpO2 99 %. Physical Exam  HEENT Extensive facial echymoses, no eye d/c , good lid closure General: No acute distress Mood and affect are appropriate Heart: Regular rate and rhythm no rubs murmurs or extra sounds Lungs: Clear to auscultation, breathing unlabored, no rales or wheezes Abdomen: Positive bowel sounds, soft nontender to palpation, nondistended Extremities: No LE edema Skin: No evidence of breakdown, no evidence of rash Neurologic: Cranial nerves II through XII intact, motor strength is 5/5 in left deltoid, bicep, tricep, grip, 4/5 Hip flexor, knee extensors, ankle dorsiflexor and plantar flexor Sensory exam normal sensation to light touch  in bilateral upper and lower extremities  Musculoskeletal: RUE in sling has good grip , color and temp Lab Results Last 48 Hours        Results for orders placed or performed during the hospital encounter of 10/12/21 (from the past 48 hour(s))  Hemoglobin and hematocrit, blood     Status: Abnormal    Collection Time: 10/17/21  3:31 AM  Result Value Ref Range    Hemoglobin 8.7 (L) 12.0 - 15.0 g/dL    HCT 27.0 (L) 36.0 - 46.0 %      Comment: Performed at [facility], [address] [place], [place] [address]  CBC     Status: Abnormal    Collection Time: 10/18/21  9:21 AM  Result Value Ref Range    WBC 9.4 4.0 - 10.5 K/uL    RBC 2.60 (L) 3.87 - 5.11 MIL/uL    Hemoglobin 8.5 (L) 12.0 - 15.0 g/dL    HCT 25.7 (L) 36.0 - 46.0 %    MCV 98.8 80.0 - 100.0 fL    MCH 32.7 26.0 - 34.0 pg    MCHC 33.1 30.0 - 36.0 g/dL    RDW 15.5 11.5 - 15.5 %    Platelets 341 150 -  400 K/uL    nRBC 0.0 0.0 - 0.2 %      Comment: Performed at [facility], [place] [address], [place], [place] [address]      Imaging Results (Last 48 hours)  No results found.           Medical Problem List and Plan: 1. Functional deficits secondary to poly trauma, pelvic and humeral fracture             -patient may Not shower             -ELOS/Goals: 10-12d sup to min A 2.  Antithrombotics: -DVT/anticoagulation:  Mechanical: Sequential compression devices, below knee Bilateral lower extremities.              -antiplatelet therapy: N/A 3. Pain Management: Hydrocodone 3 times daily with as needed oxycodone for breakthrough pain 4. Mood: LCSW to follow for evaluation and support             -antipsychotic agents: N/AA 5. Neuropsych: This patient is capable of making decisions on her own behalf. 6. Skin/Wound Care: Routine pressure-relief measures. 7. Fluids/Electrolytes/Nutrition: Monitor I/O. Check CMET in am. 8. PAF: Off eliquis. Monitor Heart rate TID on coreg BID. 9. HTN: Monitor BP TID-on Norvasc, Coreg and lisinopril 10. Recurrent hyperkalemia: Likely due to ACE--Discontinue Lisinopril?? 11. PMR: Followed by Dr.Beekman and has been managed with medrol 4 mg daily.  12. Right humerus  fracture: NWB RUE with sling at all times.  --May doff sling for ADLs with right forearm close to body. 13. Urinary retention: Continues to require I/O caths--last for 400- 800 cc. --monitor voiding with PVR/bladder scan and cath to keep volumes < 400 cc.  14. Constipation: Last BM documented on 11/21 as smal and red. Only smears since then --Will start on laxatives to prevent recurrent constipation/bleeding.   15. Nausea: Continue zofran BID. Marland Kitchen        Bary Leriche, PA-C 10/18/2021   "I have personally performed a face to face diagnostic evaluation of this patient.  Additionally, I have reviewed and concur with the physician assistant's documentation above." Charlett Blake  M.D. [facility] Fellow Am [employer] Re[employer] Diplomate Am Board of Electrodiagnostic Med Fellow Am Board of Interventional Pain

## 2021-10-18 NOTE — Progress Notes (Signed)
Pt arrived to unit at 8:00 p.m.  Pt re-oriented to rehab, patient denies any SOB, chest pain. Pt complaining of pain radiating from right shoulder to right arm down to right leg at a level of 10. Pt complaining of stomach pain and cramping at a level of 10 stating " Its the same pain I had before I had the blood clots". Pt in reclined position, call light within reach, bed at lowest position, bed alarm on.

## 2021-10-18 NOTE — Care Management Important Message (Signed)
Important Message  Patient Details  Name: [name] MRN: [id number] Date of Birth: 1932-03-17   Medicare Important Message Given:  Yes     [name] 10/18/2021, 3:01 PM

## 2021-10-18 NOTE — Discharge Summary (Signed)
Physician Discharge Summary  [name] CWU:[id number] DOB: 1932/05/10 DOA: 10/12/2021  PCP: Lawerance Cruel, MD  Admit date: 10/12/2021 Discharge date: 10/18/2021  Time spent: 60 minutes  Recommendations for Outpatient Follow-up:  Continue Foley catheter; consider voiding trial in 2 to 3 days Check CBC in 3 days and transfuse for hemoglobin less than 7 She started to have active lower GI bleed/hematochezia then consider tagged RBCs study versus CT angiogram No endoscopy/colonoscopy recommended per GI given multiple comorbid conditions including fractures, deconditioning    Discharge Diagnoses:  Principal Problem:   Acute lower GI bleeding Active Problems:   Biventricular implantable cardioverter-defibrillator in situ   Hypertension, benign   Combined hyperlipidemia   PAF (paroxysmal atrial fibrillation) (HCC)   Pelvic fracture (HCC)   Dysphagia   Acute blood loss anemia   Leukocytosis   Discharge Condition: Stable  Diet recommendation: Regular diet  Filed Weights   10/15/21 0500 10/16/21 0500 10/18/21 0500  Weight: 48.6 kg 48.8 kg 49.8 kg    History of present illness:  87 year old female with history of essential hypertension, paroxysmal atrial fibrillation on Eliquis, nonischemic cardiomyopathy, sick sinus syndrome status post pacemaker placement, mitral stenosis status postrepair, hypothyroidism, chronic kidney disease stage III, iron-deficiency anemia, PMR on chronic steroids, chronic back pain who was admitted to [facility] after patient had a fall sustaining transverse proximal humerus head fracture, right pubic ramus fracture and severe pubic ramus fracture.  Orthopedics at that time recommended nonoperative management. She was transferred from rehab to hospital after patient labs showed hyperkalemia worsening renal function, choking on food.  She also started passing blood clots with abdominal pain.  Hemoglobin down to 6.2.  Gastroenterology was consulted, recommended  no intervention.  Hospital Course:  Acute lower GI bleed/acute blood loss anemia -Resolved, off anticoagulation -She is not a candidate for long-term anticoagulation due to fall risk and multiple episodes of bloody bowel movements -Patient is not candidate for colonoscopy/endoscopy as per GI due to recent fractures and multiple chronic conditions -If she continues to bleed again consider tagged RBC study or CT angiogram -Patient's hemoglobin stable at 8.5 today -She does not have any further bloody bowel movements -Continue Protonix 40 mg p.o. daily  Leukocytosis/polymyalgia rheumatica -Likely from steroids, patient is on methylprednisolone 4 mg daily for polymyalgia rheumatica -We will cut down the dose of methylprednisolone to 2 mg daily which she was taking at home  Urinary retention/CKD stage IIIb -Patient creatinine back to baseline -Foley catheter placed for urinary retention -Consider voiding trial in 2 to 3 days  Dysphagia -Speech therapy was consulted, patient started on dysphagia 2 diet with thin liquid  Hypertension -Continue Coreg, hydralazine, Norvasc, lisinopril  Paroxysmal atrial fibrillation -Anticoagulation is on hold due to GI bleed -She is not on long-term anticoagulation for DVT multiple falls, debility and lower GI bleed as above  Chronic diastolic heart failure -Patient appears euvolemic  Humerus and pelvic fractures secondary to fall -Nonoperative management recommended per orthopedic surgery -Patient to go to CIR for rehab -Continue oxycodone for pain control  Procedures: None  Consultations: Gastroenterology  Discharge Exam: Vitals:   10/18/21 0307 10/18/21 0752  BP: (!) 166/48 (!) 147/54  Pulse:  80  Resp: 15 14  Temp: 97.8 F (36.6 C) 98.1 F (36.7 C)  SpO2:  97%    General: Appears in no acute distress Cardiovascular: S1-S2, regular, no murmur auscultated Respiratory: Clear to auscultation bilaterally  Discharge  Instructions   Discharge Instructions     Diet - low sodium  heart healthy   Complete by: As directed    Increase activity slowly   Complete by: As directed    No wound care   Complete by: As directed       Allergies as of 10/18/2021       Reactions   Pertussis Vaccines Swelling   Sulfa Antibiotics Nausea Only   Amoxicillin Rash, Other (See Comments)   Has patient had a PCN reaction causing immediate rash, facial/tongue/throat swelling, SOB or lightheadedness with hypotension: yes Has patient had a PCN reaction causing severe rash involving mucus membranes or skin necrosis: no Has patient had a PCN reaction that required hospitalization no Has patient had a PCN reaction occurring within the last 10 years: yes If all of the above answers are "NO", then may proceed with Cephalosporin use.        Medication List     STOP taking these medications    heparin 5000 UNIT/ML injection   polyethylene glycol 17 g packet Commonly known as: MIRALAX / GLYCOLAX       TAKE these medications    allopurinol 100 MG tablet Commonly known as: ZYLOPRIM Take 100 mg by mouth daily.   amLODipine 5 MG tablet Commonly known as: NORVASC Take 1 tablet (5 mg total) by mouth daily. Start taking on: October 19, 2021 What changed:  medication strength how much to take   aspirin EC 81 MG tablet Take 81 mg by mouth daily. Swallow whole.   atorvastatin 40 MG tablet Commonly known as: LIPITOR Take 1 tablet (40 mg total) by mouth daily.   carvedilol 6.25 MG tablet Commonly known as: COREG Take 1 tablet (6.25 mg total) by mouth 2 (two) times daily with a meal.   hydrALAZINE 25 MG tablet Commonly known as: APRESOLINE Take 1 tablet (25 mg total) by mouth every 6 (six) hours as needed (SBP>150 or DBP>100).   levothyroxine 75 MCG tablet Commonly known as: SYNTHROID Take 1 tablet (75 mcg total) by mouth daily.   lisinopril 10 MG tablet Commonly known as: ZESTRIL Take 10 mg by mouth  daily.   methylPREDNISolone 2 MG tablet Commonly known as: MEDROL Take 1 tablet (2 mg total) by mouth daily.   omeprazole 20 MG tablet Commonly known as: PRILOSEC OTC Take 20 mg by mouth 2 (two) times daily as needed (acid reflux).   ondansetron 4 MG tablet Commonly known as: ZOFRAN Take 1 tablet (4 mg total) by mouth every 12 (twelve) hours.   oxyCODONE 5 MG immediate release tablet Commonly known as: Oxy IR/ROXICODONE Take 1 tablet (5 mg total) by mouth every 6 (six) hours as needed for severe pain.   pantoprazole 40 MG tablet Commonly known as: PROTONIX Take 1 tablet (40 mg total) by mouth daily. Start taking on: October 19, 2021       Allergies  Allergen Reactions   Pertussis Vaccines Swelling   Sulfa Antibiotics Nausea Only   Amoxicillin Rash and Other (See Comments)    Has patient had a PCN reaction causing immediate rash, facial/tongue/throat swelling, SOB or lightheadedness with hypotension: yes Has patient had a PCN reaction causing severe rash involving mucus membranes or skin necrosis: no Has patient had a PCN reaction that required hospitalization no Has patient had a PCN reaction occurring within the last 10 years: yes If all of the above answers are "NO", then may proceed with Cephalosporin use.       The results of significant diagnostics from this hospitalization (including imaging, microbiology, ancillary and laboratory)  are listed below for reference.    Significant Diagnostic Studies: CT ABDOMEN PELVIS WO CONTRAST  Addendum Date: 10/13/2021   ADDENDUM REPORT: 10/13/2021 00:59 ADDENDUM: It should be noted there is a small right inguinal hernia with a loop of small bowel within although no obstructive changes are noted. Electronically Signed   By: Inez Catalina M.D.   On: 10/13/2021 00:59   Result Date: 10/13/2021 CLINICAL DATA:  Abdominal distension EXAM: CT ABDOMEN AND PELVIS WITHOUT CONTRAST TECHNIQUE: Multidetector CT imaging of the abdomen and  pelvis was performed following the standard protocol without IV contrast. COMPARISON:  Plain film from the previous day FINDINGS: Lower chest: Small bilateral pleural effusions are noted with associated atelectatic changes. No focal confluent infiltrate is seen. Hepatobiliary: No focal liver abnormality is seen. No gallstones, gallbladder wall thickening, or biliary dilatation. Pancreas: Unremarkable. No pancreatic ductal dilatation or surrounding inflammatory changes. Spleen: Normal in size without focal abnormality. Adrenals/Urinary Tract: Adrenal glands are within normal limits. Kidneys are well visualized bilaterally. No renal calculi or urinary tract obstructive changes are seen. Ureters are within normal limits. Bladder is decompressed by Foley catheter. Stomach/Bowel: Colon shows no obstructive or inflammatory changes. Appendix is well visualized and within normal limits. No inflammatory changes are seen. Small bowel and stomach are within normal limits. Vascular/Lymphatic: Aortic atherosclerosis. No enlarged abdominal or pelvic lymph nodes. Reproductive: Status post hysterectomy. No adnexal masses. Other: No abdominal wall hernia or abnormality. No abdominopelvic ascites. Musculoskeletal: Generalized osteopenia is noted. Changes of prior vertebral augmentation are noted at L4, L1 and T10 through T12. IMPRESSION: Small bilateral pleural effusions with associated atelectasis. No obstructive changes are seen. Normal-appearing appendix. Electronically Signed: By: Inez Catalina M.D. On: 10/13/2021 00:53   DG Chest 2 View  Result Date: 10/11/2021 CLINICAL DATA:  Difficulty swallowing. EXAM: CHEST - 2 VIEW COMPARISON:  Chest x-ray 10/01/2021. CT chest abdomen and pelvis 10/01/2021. FINDINGS: The heart is enlarged. Patient is status post cardiac surgery and valve replacement. Left-sided pacemaker is present. There is no lung consolidation, pleural effusion or pneumothorax. There is some linear scarring in the  lower lungs. Vertebroplasty changes are seen at multiple levels. Chronic midthoracic compression fractures are unchanged. IMPRESSION: 1. Cardiomegaly. 2. No acute cardiopulmonary process. Electronically Signed   By: Ronney Asters M.D.   On: 10/11/2021 17:53   DG Elbow Complete Right  Result Date: 09/30/2021 CLINICAL DATA:  Fall EXAM: RIGHT ELBOW - COMPLETE 3+ VIEW COMPARISON:  None. FINDINGS: There is no evidence of fracture, dislocation, or joint effusion. There is no evidence of arthropathy or other focal bone abnormality. Soft tissues are unremarkable. IMPRESSION: Negative. Electronically Signed   By: Franchot Gallo M.D.   On: 09/30/2021 17:20   DG Abd 1 View  Result Date: 10/12/2021 CLINICAL DATA:  Abdominal pain and diarrhea. EXAM: ABDOMEN - 1 VIEW COMPARISON:  10/08/2021 FINDINGS: Scattered air-filled small bowel and colon possibly reflecting an ileus or gastroenteritis. No free air is identified. Stable vascular calcifications. IMPRESSION: Possible ileus or gastroenteritis. Electronically Signed   By: Marijo Sanes M.D.   On: 10/12/2021 15:02   DG Abd 1 View  Result Date: 10/07/2021 CLINICAL DATA:  Constipation K59.00 (ICD-10-CM) EXAM: ABDOMEN - 1 VIEW COMPARISON:  CT October 01, 2021. FINDINGS: Gas is noted in small bowel and colon. While the overall pattern is nonobstructive, there are few mildly dilated small bowel loops and probably dilated distal colon. No significant stool burden. No obvious free air on this limited single supine radiograph. No abnormal calcifications  identified; however, bowel gas limits evaluation. Degenerative changes of the sign with kyphoplasty at multiple lower thoracic and lumbar levels. Incompletely imaged cardiac rhythm maintenance device. Partially imaged pelvic thermistor. Partially imaged nondisplaced fracture of the right pubis and superior pubic ramus, better characterized on recent CT. IMPRESSION: 1. Gas is noted in small bowel and colon. While the overall  pattern is nonobstructive, there are few mildly dilated small bowel loops and probably dilated distal colon. No significant stool burden. A CT abdomen/pelvis could better evaluate for obstruction if clinically indicated. 2. Partially imaged nondisplaced fracture of the right pubis and superior pubic ramus, better characterized on recent CT. Electronically Signed   By: Margaretha Sheffield M.D.   On: 10/07/2021 10:17   CT HEAD WO CONTRAST (5MM)  Result Date: 10/01/2021 CLINICAL DATA:  Poly trauma.  Post fall. EXAM: CT HEAD WITHOUT CONTRAST CT CERVICAL SPINE WITHOUT CONTRAST TECHNIQUE: Multidetector CT imaging of the head and cervical spine was performed following the standard protocol without intravenous contrast. Multiplanar CT image reconstructions of the cervical spine were also generated. COMPARISON:  None. FINDINGS: CT HEAD FINDINGS Brain: No evidence of acute infarction, hemorrhage, hydrocephalus, extra-axial collection or mass lesion/mass effect. Moderate brain parenchymal volume and microangiopathy. Vascular: No hyperdense vessel or unexpected calcification. Skull: Normal. Negative for fracture or focal lesion. Sinuses/Orbits: No acute finding. Other: Right frontal scalp hematoma. CT CERVICAL SPINE FINDINGS Alignment: 1-2 mm the anterolisthesis of C4 on C5 and 1-2 mm posterior listhesis of C5 on C6, likely due to degenerative facet arthropathy. Skull base and vertebrae: No acute fracture. No primary bone lesion or focal pathologic process. Soft tissues and spinal canal: No prevertebral fluid or swelling. No visible canal hematoma. Disc levels:  Multilevel osteoarthritic changes. Upper chest: Negative. Other: None. IMPRESSION: 1. No acute intracranial abnormality. 2. Moderate brain parenchymal atrophy and microangiopathy. 3. No evidence of acute traumatic injury to cervical spine. 4. Multilevel osteoarthritic changes of the cervical spine. 5. 1-2 mm anterolisthesis of C4 on C5 and 1-2 mm posterior listhesis  of C5 on C6, likely due to degenerative facet arthropathy. Electronically Signed   By: Fidela Salisbury M.D.   On: 10/01/2021 14:44   CT HEAD WO CONTRAST  Result Date: 09/30/2021 CLINICAL DATA:  Facial trauma with right orbital bruising. EXAM: CT HEAD WITHOUT CONTRAST CT MAXILLOFACIAL WITHOUT CONTRAST CT CERVICAL SPINE WITHOUT CONTRAST TECHNIQUE: Multidetector CT imaging of the head, cervical spine, and maxillofacial structures were performed using the standard protocol without intravenous contrast. Multiplanar CT image reconstructions of the cervical spine and maxillofacial structures were also generated. COMPARISON:  CT scan of the brain June 14, 2015 FINDINGS: CT HEAD FINDINGS Brain: No evidence of acute infarction, hemorrhage, hydrocephalus, extra-axial collection or mass lesion/mass effect. Vascular: Calcified atherosclerosis in the intracranial carotids. Skull: Normal. Negative for fracture or focal lesion. Other: Hematoma and soft tissue swelling in the right supraorbital region. Air in the soft tissues suggests laceration. CT MAXILLOFACIAL FINDINGS Osseous: No fracture or mandibular dislocation. No destructive process. Orbits: Negative. No traumatic or inflammatory finding. Sinuses: Clear. Soft tissues: Soft tissue swelling and hematoma in the right Peri orbital and supraorbital region. Air in the soft tissues consistent with laceration. The underlying globe is in tact. No other soft tissue abnormalities are identified. CT CERVICAL SPINE FINDINGS Alignment: Trace retrolisthesis of C2 versus C3, not seen in 2016. Anterolisthesis of C4 versus C5, unchanged since 2016. Reversal of normal lordosis centered at C5-C6, unchanged since 2016. No other significant malalignment. Skull base and vertebrae: No acute  fracture. No primary bone lesion or focal pathologic process. Soft tissues and spinal canal: No prevertebral soft tissue swelling. No other soft tissue abnormalities. Disc levels: Multilevel  degenerative disc disease. Facet degenerative changes. Upper chest: Scarring in the apices. No acute abnormalities in the upper lungs. Other: Fracture of the proximal right humerus seen on the scout view. No other abnormalities. IMPRESSION: 1. No acute intracranial abnormalities. 2. Hematoma and soft tissue swelling in the right supraorbital region. No facial bone fractures. 3. Trace retrolisthesis of C2 versus C3, anterolisthesis of C4 versus C5, and reversal of normal lordosis at C5-6 is favored to be nonacute with no adjacent soft tissue swelling. The findings at C4-5 and C5-6 are unchanged since 2016. Recommend clinical correlation. 4. No fracture in the cervical spine. 5. Fracture of the proximal right humerus, better assessed on the dedicated right shoulder x-ray. Electronically Signed   By: Dorise Bullion III M.D.   On: 09/30/2021 17:22   CT Cervical Spine Wo Contrast  Result Date: 10/01/2021 CLINICAL DATA:  Poly trauma.  Post fall. EXAM: CT HEAD WITHOUT CONTRAST CT CERVICAL SPINE WITHOUT CONTRAST TECHNIQUE: Multidetector CT imaging of the head and cervical spine was performed following the standard protocol without intravenous contrast. Multiplanar CT image reconstructions of the cervical spine were also generated. COMPARISON:  None. FINDINGS: CT HEAD FINDINGS Brain: No evidence of acute infarction, hemorrhage, hydrocephalus, extra-axial collection or mass lesion/mass effect. Moderate brain parenchymal volume and microangiopathy. Vascular: No hyperdense vessel or unexpected calcification. Skull: Normal. Negative for fracture or focal lesion. Sinuses/Orbits: No acute finding. Other: Right frontal scalp hematoma. CT CERVICAL SPINE FINDINGS Alignment: 1-2 mm the anterolisthesis of C4 on C5 and 1-2 mm posterior listhesis of C5 on C6, likely due to degenerative facet arthropathy. Skull base and vertebrae: No acute fracture. No primary bone lesion or focal pathologic process. Soft tissues and spinal canal: No  prevertebral fluid or swelling. No visible canal hematoma. Disc levels:  Multilevel osteoarthritic changes. Upper chest: Negative. Other: None. IMPRESSION: 1. No acute intracranial abnormality. 2. Moderate brain parenchymal atrophy and microangiopathy. 3. No evidence of acute traumatic injury to cervical spine. 4. Multilevel osteoarthritic changes of the cervical spine. 5. 1-2 mm anterolisthesis of C4 on C5 and 1-2 mm posterior listhesis of C5 on C6, likely due to degenerative facet arthropathy. Electronically Signed   By: Fidela Salisbury M.D.   On: 10/01/2021 14:44   CT CERVICAL SPINE WO CONTRAST  Result Date: 09/30/2021 CLINICAL DATA:  Facial trauma with right orbital bruising. EXAM: CT HEAD WITHOUT CONTRAST CT MAXILLOFACIAL WITHOUT CONTRAST CT CERVICAL SPINE WITHOUT CONTRAST TECHNIQUE: Multidetector CT imaging of the head, cervical spine, and maxillofacial structures were performed using the standard protocol without intravenous contrast. Multiplanar CT image reconstructions of the cervical spine and maxillofacial structures were also generated. COMPARISON:  CT scan of the brain June 14, 2015 FINDINGS: CT HEAD FINDINGS Brain: No evidence of acute infarction, hemorrhage, hydrocephalus, extra-axial collection or mass lesion/mass effect. Vascular: Calcified atherosclerosis in the intracranial carotids. Skull: Normal. Negative for fracture or focal lesion. Other: Hematoma and soft tissue swelling in the right supraorbital region. Air in the soft tissues suggests laceration. CT MAXILLOFACIAL FINDINGS Osseous: No fracture or mandibular dislocation. No destructive process. Orbits: Negative. No traumatic or inflammatory finding. Sinuses: Clear. Soft tissues: Soft tissue swelling and hematoma in the right Peri orbital and supraorbital region. Air in the soft tissues consistent with laceration. The underlying globe is in tact. No other soft tissue abnormalities are identified. CT CERVICAL SPINE  FINDINGS Alignment:  Trace retrolisthesis of C2 versus C3, not seen in 2016. Anterolisthesis of C4 versus C5, unchanged since 2016. Reversal of normal lordosis centered at C5-C6, unchanged since 2016. No other significant malalignment. Skull base and vertebrae: No acute fracture. No primary bone lesion or focal pathologic process. Soft tissues and spinal canal: No prevertebral soft tissue swelling. No other soft tissue abnormalities. Disc levels: Multilevel degenerative disc disease. Facet degenerative changes. Upper chest: Scarring in the apices. No acute abnormalities in the upper lungs. Other: Fracture of the proximal right humerus seen on the scout view. No other abnormalities. IMPRESSION: 1. No acute intracranial abnormalities. 2. Hematoma and soft tissue swelling in the right supraorbital region. No facial bone fractures. 3. Trace retrolisthesis of C2 versus C3, anterolisthesis of C4 versus C5, and reversal of normal lordosis at C5-6 is favored to be nonacute with no adjacent soft tissue swelling. The findings at C4-5 and C5-6 are unchanged since 2016. Recommend clinical correlation. 4. No fracture in the cervical spine. 5. Fracture of the proximal right humerus, better assessed on the dedicated right shoulder x-ray. Electronically Signed   By: Dorise Bullion III M.D.   On: 09/30/2021 17:22   US RENAL  Result Date: 10/04/2021 CLINICAL DATA:  Acute kidney injury EXAM: RENAL / URINARY TRACT ULTRASOUND COMPLETE COMPARISON:  CT 10/01/2021 FINDINGS: Right Kidney: Renal measurements: 7.9 x 4.6 x 4.1 cm = volume: 61 mL. Cortex is echogenic. Diffuse cortical thinning consistent with atrophy. No hydronephrosis or mass. Left Kidney: Renal measurements: 9.5 x 4.8 x 4.5 cm = volume: 107.4 mL. Cortex slightly echogenic. Diffuse cortical thinning. No mass or hydronephrosis Bladder: Decompressed by Foley catheter Other: None. IMPRESSION: 1. Slightly echogenic kidneys with diffuse cortical thinning/atrophy consistent with medical renal  disease. 2. Negative for hydronephrosis Electronically Signed   By: Donavan Foil M.D.   On: 10/04/2021 16:52   CT CHEST ABDOMEN PELVIS W CONTRAST  Result Date: 10/01/2021 CLINICAL DATA:  Fall down stairs. Blunt trauma. Right-sided chest and abdominal pain. Initial encounter. EXAM: CT CHEST, ABDOMEN, AND PELVIS WITH CONTRAST TECHNIQUE: Multidetector CT imaging of the chest, abdomen and pelvis was performed following the standard protocol during bolus administration of intravenous contrast. CONTRAST:  96mL OMNIPAQUE IOHEXOL 350 MG/ML SOLN COMPARISON:  None. FINDINGS: CT CHEST FINDINGS Cardiovascular: No evidence of thoracic aortic injury or mediastinal hematoma. No pericardial effusion. Mild-to-moderate cardiomegaly. Pacemaker leads are seen in expected position, and prosthetic mitral valve also noted. Mediastinum/Nodes: No evidence of hemorrhage or pneumomediastinum. No masses or pathologically enlarged lymph nodes identified. Lungs/Pleura: No evidence of pulmonary contusion or other infiltrate. No evidence of pneumothorax or hemothorax. Musculoskeletal: No acute fractures or suspicious bone lesions identified. Old compression fractures and multiple vertebroplasties are noted in the lower thoracic spine. CT ABDOMEN PELVIS FINDINGS Hepatobiliary: No hepatic laceration or mass identified. Mild diffuse hepatic steatosis is noted. Gallbladder is unremarkable. No evidence of biliary ductal dilatation. Pancreas: No parenchymal laceration, mass, or inflammatory changes identified. Spleen: No evidence of splenic laceration. Adrenal/Urinary Tract: No hemorrhage or parenchymal lacerations identified. No evidence of mass or hydronephrosis. Unremarkable unopacified urinary bladder. Stomach/Bowel: Unopacified bowel loops are unremarkable in appearance. No evidence of hemoperitoneum. Vascular/Lymphatic: No evidence of abdominal aortic injury or retroperitoneal hemorrhage. Aortic atherosclerotic calcification noted. No  pathologically enlarged lymph nodes identified. Reproductive: Prior hysterectomy noted. Adnexal regions are unremarkable in appearance. Other: A small right inguinal hernia is seen which contains a loop of small bowel. No evidence of bowel obstruction or strangulation. Musculoskeletal: A nondisplaced fracture  is seen involving the right pubis and superior pubic ramus. Multiple previous lumbar vertebroplasties noted. IMPRESSION: Nondisplaced fracture of right pubis and superior pubic ramus. No evidence of internal organ injury. Small right inguinal hernia containing a loop of small bowel. No evidence of bowel obstruction or strangulation. Mild hepatic steatosis. Aortic Atherosclerosis (ICD10-I70.0). Electronically Signed   By: Marlaine Hind M.D.   On: 10/01/2021 14:53   CT T-SPINE NO CHARGE  Result Date: 10/01/2021 CLINICAL DATA:  Pain.  Fall today. EXAM: CT THORACIC AND LUMBAR SPINE WITH CONTRAST TECHNIQUE: Multiplanar CT images of the thoracic and lumbar spine were reconstructed from contemporary CT of the Chest, Abdomen, and Pelvis CONTRAST:  No additional COMPARISON:  Chest radiographs 04/21/2019. Lumbar spine radiographs 04/08/2013. Lumbar spine MRI 03/05/2007. FINDINGS: CT THORACIC SPINE FINDINGS Alignment: Chronically exaggerated thoracic kyphosis. No significant listhesis in the thoracic spine. Vertebrae: Chronic, previously augmented T10, T11, and T12 compression fractures. Chronic, mild T4, mild-to-moderate T5, and moderate T8 and T9 compression fractures, all similar to the 2020 chest radiographs. Schmorl's nodes at T2, T3, T6, and T7. No definite acute fracture or suspicious osseous lesion. Paraspinal and other soft tissues: No acute abnormality identified in the paraspinal soft tissues. Intrathoracic contents reported separately. Disc levels: Mild thoracic spondylosis without evidence of high-grade stenosis. CT LUMBAR SPINE FINDINGS Segmentation: 5 lumbar type vertebrae. Alignment: Normal. Vertebrae:  Chronic, previously augmented L1 and L4 compression fractures. Preserved heights of the other lumbar vertebral bodies without an acute fracture or suspicious osseous lesion identified. Paraspinal and other soft tissues: No acute abnormality identified in the paraspinal soft tissues. Intra-abdominal and pelvic contents reported separately. Disc levels: Mild bilateral neural foraminal stenosis at L3-4 and L4-5 due to disc bulging and mild facet arthrosis. No high-grade spinal stenosis. IMPRESSION: 1. No evidence of acute osseous abnormality in the thoracic or lumbar spine. 2. Numerous chronic compression fractures. Electronically Signed   By: Logan Bores M.D.   On: 10/01/2021 14:28   CT L-SPINE NO CHARGE  Result Date: 10/01/2021 CLINICAL DATA:  Pain.  Fall today. EXAM: CT THORACIC AND LUMBAR SPINE WITH CONTRAST TECHNIQUE: Multiplanar CT images of the thoracic and lumbar spine were reconstructed from contemporary CT of the Chest, Abdomen, and Pelvis CONTRAST:  No additional COMPARISON:  Chest radiographs 04/21/2019. Lumbar spine radiographs 04/08/2013. Lumbar spine MRI 03/05/2007. FINDINGS: CT THORACIC SPINE FINDINGS Alignment: Chronically exaggerated thoracic kyphosis. No significant listhesis in the thoracic spine. Vertebrae: Chronic, previously augmented T10, T11, and T12 compression fractures. Chronic, mild T4, mild-to-moderate T5, and moderate T8 and T9 compression fractures, all similar to the 2020 chest radiographs. Schmorl's nodes at T2, T3, T6, and T7. No definite acute fracture or suspicious osseous lesion. Paraspinal and other soft tissues: No acute abnormality identified in the paraspinal soft tissues. Intrathoracic contents reported separately. Disc levels: Mild thoracic spondylosis without evidence of high-grade stenosis. CT LUMBAR SPINE FINDINGS Segmentation: 5 lumbar type vertebrae. Alignment: Normal. Vertebrae: Chronic, previously augmented L1 and L4 compression fractures. Preserved heights of the  other lumbar vertebral bodies without an acute fracture or suspicious osseous lesion identified. Paraspinal and other soft tissues: No acute abnormality identified in the paraspinal soft tissues. Intra-abdominal and pelvic contents reported separately. Disc levels: Mild bilateral neural foraminal stenosis at L3-4 and L4-5 due to disc bulging and mild facet arthrosis. No high-grade spinal stenosis. IMPRESSION: 1. No evidence of acute osseous abnormality in the thoracic or lumbar spine. 2. Numerous chronic compression fractures. Electronically Signed   By: Logan Bores M.D.   On:  10/01/2021 14:28   DG Chest Portable 1 View  Result Date: 10/01/2021 CLINICAL DATA:  Fall.  Nausea.  Oliguria.  Bruising.  Pain. EXAM: PORTABLE CHEST 1 VIEW COMPARISON:  Chest radiograph 04/21/2019 FINDINGS: AICD noted. Mitral valve prosthesis. The patient is rotated to the left on today's radiograph, reducing diagnostic sensitivity and specificity. Atherosclerotic calcification of the aortic arch. Lower thoracic vertebral augmentations. Bony demineralization. The visualized lungs appear clear, part of the left lung base is obscured by the pulse generator. Mild enlargement of the cardiopericardial silhouette. Discontinuity in the proximal right humerus compatible with fracture. IMPRESSION: 1. No acute findings. 2. Mild enlargement of the cardiopericardial silhouette. 3. AICD and mitral valve prosthesis noted. 4. Fracture of the right proximal humerus partially included on today's exam. Electronically Signed   By: Van Clines M.D.   On: 10/01/2021 12:35   DG Shoulder Right Port  Result Date: 09/30/2021 CLINICAL DATA:  Fall EXAM: PORTABLE RIGHT SHOULDER COMPARISON:  None. FINDINGS: Transverse fracture of the humeral neck. Fracture of the greater tuberosity. No dislocation. Multiple thoracic fractures with vertebroplasty cement. IMPRESSION: Fracture humeral neck and greater tuberosity Electronically Signed   By: Franchot Gallo  M.D.   On: 09/30/2021 17:21   DG Tibia/Fibula Right Port  Result Date: 10/01/2021 CLINICAL DATA:  Fall yesterday. Worsening pain. Laceration along the anterior shin. EXAM: PORTABLE RIGHT TIBIA AND FIBULA - 2 VIEW COMPARISON:  Knee radiographs from 08/12/2015 FINDINGS: Vascular calcifications noted. Diffuse subcutaneous edema. No tibial or fibular fracture is identified. Lucency along the soft tissues the anterior shin probably reflecting laceration. IMPRESSION: 1. Laceration along the anterior shin, without underlying fracture. 2. Vascular calcifications. 3. Subcutaneous edema diffusely along the calf. Electronically Signed   By: Van Clines M.D.   On: 10/01/2021 12:37   DG Abd Portable 1V  Result Date: 10/08/2021 CLINICAL DATA:  87 year old female with nausea. EXAM: PORTABLE ABDOMEN - 1 VIEW COMPARISON:  10/07/2021 FINDINGS: Scattered loops of gas containing small bowel and colon without significant distension, decreased from comparison. There is gaseous distension of the stomach. Similar appearing postsurgical changes after multilevel thoracolumbar vertebral body cement augmentation. Similar appearance of previously characterized right pubic ramus fracture. IMPRESSION: Resolving ileus. Electronically Signed   By: Ruthann Cancer M.D.   On: 10/08/2021 10:08   DG Humerus Right  Result Date: 09/30/2021 CLINICAL DATA:  Fall EXAM: RIGHT HUMERUS - 2+ VIEW COMPARISON:  None. FINDINGS: Fracture right humeral neck with mild displacement. Fracture extends into the greater tuberosity of the humerus. Normal alignment of the humeral head. IMPRESSION: Transverse fracture proximal humeral neck. Fracture greater tuberosity. Electronically Signed   By: Franchot Gallo M.D.   On: 09/30/2021 17:18   ECHOCARDIOGRAM COMPLETE  Result Date: 10/02/2021    ECHOCARDIOGRAM REPORT   Patient Name:   [name] Date of Exam: 10/02/2021 Medical Rec #:  [id number]      Height:       56.0 in Accession #:    [id number]      Weight:       96.8 lb Date of Birth:  06-22-1932       BSA:          1.304 m Patient Age:    33 years       BP:           120/44 mmHg Patient Gender: F              HR:           88  bpm. Exam Location:  Inpatient Procedure: 2D Echo, Color Doppler, Cardiac Doppler and Intracardiac            Opacification Agent Indications:    fall  History:        Patient has no prior history of Echocardiogram examinations. CHF                 and Cardiomyopathy, Arrythmias:Atrial Fibrillation; Risk                 Factors:Dyslipidemia and Hypertension.                  Mitral Valve: valve is present in the mitral position.  Sonographer:    Ula Lingo RDCS (AE, PE) Referring Phys: [id number] Lequita Halt IMPRESSIONS  1. Left ventricular ejection fraction, by estimation, is 55 to 60%. The left ventricle has normal function. The left ventricle has no regional wall motion abnormalities. Left ventricular diastolic parameters are indeterminate.  2. Pacing wires in RA/RV. Right ventricular systolic function is normal. The right ventricular size is normal.  3. The aortic valve is tricuspid. Aortic valve regurgitation is not visualized. No aortic stenosis is present.  4. The inferior vena cava is normal in size with greater than 50% respiratory variability, suggesting right atrial pressure of 3 mmHg.  5. Left atrial size was moderately dilated. FINDINGS  Left Ventricle: Left ventricular ejection fraction, by estimation, is 55 to 60%. The left ventricle has normal function. The left ventricle has no regional wall motion abnormalities. The left ventricular internal cavity size was normal in size. There is  no left ventricular hypertrophy. Left ventricular diastolic parameters are indeterminate. Right Ventricle: Pacing wires in RA/RV. The right ventricular size is normal. No increase in right ventricular wall thickness. Right ventricular systolic function is normal. Left Atrium: Left atrial size was moderately dilated. Right Atrium: Right  atrial size was normal in size. Pericardium: There is no evidence of pericardial effusion. Mitral Valve: No details provided by tech. MV appears to have been repaired with annuloplasty ring no significant residual MR Mean diastolic gradient at HR 86 bpm only 3 mmHg. The mitral valve has been repaired/replaced. No evidence of mitral valve regurgitation. There is a present in the mitral position. No evidence of mitral valve stenosis. MV peak gradient, 9.1 mmHg. The mean mitral valve gradient is 3.0 mmHg. Tricuspid Valve: The tricuspid valve is normal in structure. Tricuspid valve regurgitation is mild . No evidence of tricuspid stenosis. Aortic Valve: The aortic valve is tricuspid. Aortic valve regurgitation is not visualized. No aortic stenosis is present. Pulmonic Valve: The pulmonic valve was normal in structure. Pulmonic valve regurgitation is not visualized. No evidence of pulmonic stenosis. Aorta: The aortic root is normal in size and structure. Venous: The inferior vena cava is normal in size with greater than 50% respiratory variability, suggesting right atrial pressure of 3 mmHg. IAS/Shunts: No atrial level shunt detected by color flow Doppler.  LEFT VENTRICLE PLAX 2D LVIDd:         4.50 cm LVIDs:         3.00 cm LV PW:         0.90 cm LV IVS:        0.80 cm LVOT diam:     1.90 cm LV SV:         46 LV SV Index:   35 LVOT Area:     2.84 cm  RIGHT VENTRICLE  IVC RV S prime:     10.10 cm/s  IVC diam: 1.10 cm TAPSE (M-mode): 0.7 cm LEFT ATRIUM             Index        RIGHT ATRIUM           Index LA diam:        4.40 cm 3.37 cm/m   RA Area:     12.20 cm LA Vol (A2C):   70.4 ml 53.99 ml/m  RA Volume:   25.00 ml  19.17 ml/m LA Vol (A4C):   75.4 ml 57.82 ml/m LA Biplane Vol: 74.6 ml 57.21 ml/m  AORTIC VALVE LVOT Vmax:   90.10 cm/s LVOT Vmean:  74.100 cm/s LVOT VTI:    0.163 m  AORTA Ao Root diam: 2.60 cm Ao Asc diam:  2.90 cm MITRAL VALVE                TRICUSPID VALVE MV Area (PHT): 4.29 cm      TR Peak grad:   32.5 mmHg MV Area VTI:   1.73 cm     TR Vmax:        285.00 cm/s MV Peak grad:  9.1 mmHg MV Mean grad:  3.0 mmHg     SHUNTS MV Vmax:       1.51 m/s     Systemic VTI:  0.16 m MV Vmean:      78.5 cm/s    Systemic Diam: 1.90 cm MV Decel Time: 177 msec MV E velocity: 108.00 cm/s MV A velocity: 101.00 cm/s MV E/A ratio:  1.07 Jenkins Rouge MD Electronically signed by Jenkins Rouge MD Signature Date/Time: 10/02/2021/1:39:13 PM    Final    DG Hip Unilat W or Wo Pelvis 2-3 Views Right  Result Date: 09/30/2021 CLINICAL DATA:  Fall EXAM: DG HIP (WITH OR WITHOUT PELVIS) 2-3V RIGHT COMPARISON:  None. FINDINGS: There is no evidence of hip fracture or dislocation. There is no evidence of arthropathy or other focal bone abnormality. Arterial calcification. Prior fracture of L4 with vertebroplasty cement. IMPRESSION: Negative. Electronically Signed   By: Franchot Gallo M.D.   On: 09/30/2021 17:19   CT MAXILLOFACIAL WO CONTRAST  Result Date: 09/30/2021 CLINICAL DATA:  Facial trauma with right orbital bruising. EXAM: CT HEAD WITHOUT CONTRAST CT MAXILLOFACIAL WITHOUT CONTRAST CT CERVICAL SPINE WITHOUT CONTRAST TECHNIQUE: Multidetector CT imaging of the head, cervical spine, and maxillofacial structures were performed using the standard protocol without intravenous contrast. Multiplanar CT image reconstructions of the cervical spine and maxillofacial structures were also generated. COMPARISON:  CT scan of the brain June 14, 2015 FINDINGS: CT HEAD FINDINGS Brain: No evidence of acute infarction, hemorrhage, hydrocephalus, extra-axial collection or mass lesion/mass effect. Vascular: Calcified atherosclerosis in the intracranial carotids. Skull: Normal. Negative for fracture or focal lesion. Other: Hematoma and soft tissue swelling in the right supraorbital region. Air in the soft tissues suggests laceration. CT MAXILLOFACIAL FINDINGS Osseous: No fracture or mandibular dislocation. No destructive process. Orbits:  Negative. No traumatic or inflammatory finding. Sinuses: Clear. Soft tissues: Soft tissue swelling and hematoma in the right Peri orbital and supraorbital region. Air in the soft tissues consistent with laceration. The underlying globe is in tact. No other soft tissue abnormalities are identified. CT CERVICAL SPINE FINDINGS Alignment: Trace retrolisthesis of C2 versus C3, not seen in 2016. Anterolisthesis of C4 versus C5, unchanged since 2016. Reversal of normal lordosis centered at C5-C6, unchanged since 2016. No other significant malalignment. Skull base and vertebrae:  No acute fracture. No primary bone lesion or focal pathologic process. Soft tissues and spinal canal: No prevertebral soft tissue swelling. No other soft tissue abnormalities. Disc levels: Multilevel degenerative disc disease. Facet degenerative changes. Upper chest: Scarring in the apices. No acute abnormalities in the upper lungs. Other: Fracture of the proximal right humerus seen on the scout view. No other abnormalities. IMPRESSION: 1. No acute intracranial abnormalities. 2. Hematoma and soft tissue swelling in the right supraorbital region. No facial bone fractures. 3. Trace retrolisthesis of C2 versus C3, anterolisthesis of C4 versus C5, and reversal of normal lordosis at C5-6 is favored to be nonacute with no adjacent soft tissue swelling. The findings at C4-5 and C5-6 are unchanged since 2016. Recommend clinical correlation. 4. No fracture in the cervical spine. 5. Fracture of the proximal right humerus, better assessed on the dedicated right shoulder x-ray. Electronically Signed   By: Dorise Bullion III M.D.   On: 09/30/2021 17:22    Microbiology: Recent Results (from the past 240 hour(s))  Urine Culture     Status: None   Collection Time: 10/09/21  1:29 PM   Specimen: Urine, Catheterized  Result Value Ref Range Status   Specimen Description URINE, CATHETERIZED  Final   Special Requests NONE  Final   Culture   Final    NO  GROWTH Performed at [facility], [address], [place], [place] [address]    Report Status 10/11/2021 FINAL  Final     Labs: Basic Metabolic Panel: Recent Labs  Lab 10/12/21 1624 10/13/21 0617 10/14/21 0303 10/15/21 0545 10/16/21 0851  NA 134* 136 135 135 135  K 5.0 4.8 5.2* 4.6 4.6  CL 109 111 108 108 107  CO2 18* 19* 19* 21* 21*  GLUCOSE 196* 77 68* 82 93  BUN 41* 32* 31* 33* 32*  CREATININE 1.20* 1.01* 1.13* 1.15* 0.98  CALCIUM 7.4* 7.5* 7.3* 7.5* 7.9*   Liver Function Tests: Recent Labs  Lab 10/12/21 0245 10/13/21 0617 10/14/21 0303  AST 55* 52* 42*  ALT 66* 54* 46*  ALKPHOS 89 77 79  BILITOT 0.9 0.8 0.5  PROT 4.0* 4.3* 4.4*  ALBUMIN 1.9* 2.0* 2.0*   No results for input(s): LIPASE, AMYLASE in the last 168 hours. No results for input(s): AMMONIA in the last 168 hours. CBC: Recent Labs  Lab 10/12/21 0245 10/12/21 0506 10/13/21 0617 10/14/21 0834 10/16/21 0851 10/17/21 0331 10/18/21 0921  WBC 12.0*  --   --   --   --   --  9.4  NEUTROABS 8.2*  --   --   --   --   --   --   HGB 7.9*   < > 9.3* 9.6* 9.3* 8.7* 8.5*  HCT 24.9*   < > 28.3* 29.3* 28.7* 27.0* 25.7*  MCV 99.6  --   --   --   --   --  98.8  PLT 352  --   --   --   --   --  341   < > = values in this interval not displayed.        Signed:  Oswald Hillock MD.  [facility] 10/18/2021, 11:23 AM

## 2021-10-18 NOTE — Progress Notes (Signed)
Inpatient Rehabilitation Admission Medication Review by a Pharmacist  A complete drug regimen review was completed for this patient to identify any potential clinically significant medication issues.  High Risk Drug Classes Is patient taking? Indication by Medication  Antipsychotic Yes PRN compazine n/v, trazodone for sleep  Anticoagulant No   Antibiotic No   Opioid Yes Oxycodone for pain PRN  Antiplatelet No   Hypoglycemics/insulin No   Vasoactive Medication Yes Amlodipine, coreg, hydralazine, lisinopril for HTN  Chemotherapy No   Other Yes Synthroid - hypothyroidism Atorvastatin - HLD Methylprednisolone - polymyalgia rheumatica Synthroid - hypothyroidism     Type of Medication Issue Identified Description of Issue Recommendation(s)  Drug Interaction(s) (clinically significant)     Duplicate Therapy     Allergy     No Medication Administration End Date     Incorrect Dose  Methylprednisolone 2mg  on dc summary but 4mg  on CIR Discuss with team in AM  Additional Drug Therapy Needed  ASA, allopurinol on dc summary but not on CIR Discuss with team in AM  Significant med changes from prior encounter (inform family/care partners about these prior to discharge).    Other       Clinically significant medication issues were identified that warrant physician communication and completion of prescribed/recommended actions by midnight of the next day:  Yes  Name of provider notified for urgent issues identified: Algis Liming, PA  Provider Method of Notification: secure chat    Pharmacist comments: Need to clarify about dose and discrepancy meds on transfer  Time spent performing this drug regimen review (minutes):  15

## 2021-10-18 NOTE — Progress Notes (Signed)
PMR Admission Coordinator Pre-Admission Assessment  Patient: [name] is an 87 y.o., female MRN: [id number] DOB: 3/2/1933Height:   Weight: 49.8 kg  Insurance Information HMO:     PPO:      PCP:      IPA:      80/20:      OTHER:  PRIMARY: Medicare a and b      Policy#: [id number]      Subscriber: pt Benefits:  Phone #: passport one source online     Name: 11/18 Eff. Date: a 01/25/1999 and b 07/28/1999     Deduct: $1556      Out of Pocket Max: none      Life Max: none CIR: 100%      SNF: 20 full days Outpatient: 80%     Co-Pay: 20% Home Health: 100%      Co-Pay: none DME: 80%     Co-Pay: 20% Providers: pt choice  SECONDARY: [employer]      Policy#: [id number]  Financial Counselor:       Phone#:   The "Data Collection Information Summary" for patients in Inpatient Rehabilitation Facilities with attached "Privacy Act Statement-Health Care Records" was provided and verbally reviewed with: Patient and Family  Emergency Contact Information Contact Information     Name Relation Home Work Mobile   Smith,[name] Daughter [phone]  [phone]   Edwards,Dee Daughter [phone]        Current Medical History  Patient Admitting Diagnosis: polytrauma, GI Bleed  History of Present Illness: 87 year old female with medical history significant of PAF on Eliquis started recently, nonischemic cardiomyopathy, mitral stenosis post repair, SSS status post PPM HTN, CKD stage III, chronic iron deficient anemia, polymyalgia rheumatica on chronic steroids, chronic back pain on narcotics and sees Dr Bartko who presented on 10/01/2021 with question of syncope/near syncope and fall. Fell at home while setting up home decorations .  Imaging showed transverse fracture of proximal humeral neck and fracture greater tuberosity. Sent home with outpatient follow up with ortho., She was re scanned and found to have a right pubic ramus as well as superior pubic ramus fracture. K was 5.8 and  Hgb 6.8. Anemia felt due to multiple hematomas including right forehead, right arm FX, pelvic FX and right leg wound. Transfused and to monitor. Eliquis on hold. Started on heparin for DVT prophylaxis. Chronic diastolic CHF and is euvolemic. Lasix on hold. Leukocytosis with pro calcitonin elevated. Patient empirically started on Cefepime. CXR negative, UA clear and blood cultures penidng. Creat up to 1.96 with Lasix held. HTN and Coreg restarted at lower dose and amlodipine. Continue to hole ACE inhibitor. Urinary retention so Foley placed. Right calf skin ulceration with WOC consulted. PAF, Coreg low dose started. Hypothyroidism on Synthroid. Polymyalgia rheumatica on Medrol as stress does.  Pt. Was admitted to CIR on 10/06/2021. While at CIR initially noted complaints of nausea and constipation, but constipation improved with bowel regimen and KUB'S showed no sign of obstruction. Nausea treated with antiemetics. ON 11/14 she was noted to have some soft BPS. Some swallowing difficulties and GI was consulted. No food impaction felt but then she began passing blood clots in her stool. She was transferred to acute hospital on 10/12/21 with a Hgb drop to 6.2. She had been receiving lovenox, but Eliquis had been on hold. GI felt conservative treatment due to her deconditioning and comorbid conditions in absence of life threatening GI hemorrhage. Transfuse and monitor Hgb. Palliative consulted for goals of   care for not a candidate for anticoagulation. Pt. With continued functional deficits and therapy recommending return to CIR.     Patient's medical record from [facility] has been reviewed by the rehabilitation admission coordinator and physician.  Past Medical History  Past Medical History:  Diagnosis Date   Allergic rhinitis    Anemia    iron deficient   Aneurysm (HCC)    Anxiety    Basal cell carcinoma    Chronic anemia    Chronic low back pain    Colon polyps    GERD (gastroesophageal  reflux disease)    Hiatal hernia    HTN (hypertension)    Hypercholesteremia    Hyperlipidemia    Hypothyroidism    Insomnia    Left heart failure (HCC)    Osteoporosis    S/P forteo treatment as of 02/2009   Pacemaker    Polymyalgia (HCC)    rheumatica-steroids per Rheum   Systolic heart failure    Has the patient had major surgery during 100 days prior to admission? No  Family History   family history includes Breast cancer in her sister; Cancer (age of onset: 33) in her father; Colon cancer in her brother and sister; Stroke in her mother.  Current Medications  Current Facility-Administered Medications:    acetaminophen (TYLENOL) tablet 650 mg, 650 mg, Oral, Q6H PRN, 650 mg at 10/13/21 0616 **OR** acetaminophen (TYLENOL) suppository 650 mg, 650 mg, Rectal, Q6H PRN, Smith, Rondell A, MD   albuterol (PROVENTIL) (2.5 MG/3ML) 0.083% nebulizer solution 2.5 mg, 2.5 mg, Nebulization, Q6H PRN, Smith, Rondell A, MD   amLODipine (NORVASC) tablet 5 mg, 5 mg, Oral, Daily, Feliz Ortiz, Abraham, MD, 5 mg at 10/18/21 0808   atorvastatin (LIPITOR) tablet 40 mg, 40 mg, Oral, Daily, Smith, Rondell A, MD, 40 mg at 10/18/21 0807   carvedilol (COREG) tablet 6.25 mg, 6.25 mg, Oral, BID WC, Feliz Ortiz, Abraham, MD, 6.25 mg at 10/18/21 0807   Chlorhexidine Gluconate Cloth 2 % PADS 6 each, 6 each, Topical, Daily, Feliz Ortiz, Abraham, MD, 6 each at 10/17/21 1222   hydrALAZINE (APRESOLINE) tablet 25 mg, 25 mg, Oral, Q6H PRN, Smith, Rondell A, MD, 25 mg at 10/15/21 0816   HYDROcodone-acetaminophen (NORCO) 10-325 MG per tablet 1 tablet, 1 tablet, Oral, TID AC, Golding, Elizabeth L, DO, 1 tablet at 10/18/21 0805   levothyroxine (SYNTHROID) tablet 75 mcg, 75 mcg, Oral, Daily, Smith, Rondell A, MD, 75 mcg at 10/18/21 0514   lisinopril (ZESTRIL) tablet 10 mg, 10 mg, Oral, Daily, Feliz Ortiz, Abraham, MD, 10 mg at 10/18/21 0807   melatonin tablet 5 mg, 5 mg, Oral, QHS PRN, Smith, Rondell A, MD, 5 mg at 10/17/21  2140   methylPREDNISolone (MEDROL) tablet 4 mg, 4 mg, Oral, Daily, Smith, Rondell A, MD, 4 mg at 10/18/21 0806   ondansetron (ZOFRAN) tablet 4 mg, 4 mg, Oral, Q6H PRN, 4 mg at 10/16/21 1447 **OR** ondansetron (ZOFRAN) injection 4 mg, 4 mg, Intravenous, Q6H PRN, Smith, Rondell A, MD   ondansetron (ZOFRAN) tablet 4 mg, 4 mg, Oral, Q12H, Golding, Elizabeth L, DO, 4 mg at 10/18/21 0806   oxyCODONE (Oxy IR/ROXICODONE) immediate release tablet 5 mg, 5 mg, Oral, Q6H PRN, Smith, Rondell A, MD, 5 mg at 10/17/21 2140   pantoprazole (PROTONIX) EC tablet 40 mg, 40 mg, Oral, Daily, Smith, Rondell A, MD, 40 mg at 10/18/21 0806   simethicone (MYLICON) chewable tablet 80 mg, 80 mg, Oral, QID PRN, Feliz Ortiz, Abraham, MD    Patients Current Diet:  Diet Order             Diet regular Room service appropriate? Yes; Fluid consistency: Thin  Diet effective now                  Precautions / Restrictions Precautions Precautions: Fall Precaution Comments: Significant bruising to face/neck R>L, High fall risk Restrictions Weight Bearing Restrictions: Yes RUE Weight Bearing: Non weight bearing RLE Weight Bearing: Weight bearing as tolerated LLE Weight Bearing: Weight bearing as tolerated Other Position/Activity Restrictions: Sling at all times except for during ADLs; keep elbow close to body when not in sling. No PROM/AROM at R shoulder for 2-3 weeks   Has the patient had 2 or more falls or a fall with injury in the past year? Yes  Prior Activity Level Community (5-7x/wk): Independent and living alone; drove short disctances  Prior Functional Level Self Care: Did the patient need help bathing, dressing, using the toilet or eating? Independent  Indoor Mobility: Did the patient need assistance with walking from room to room (with or without device)? Independent  Stairs: Did the patient need assistance with internal or external stairs (with or without device)? Independent  Functional Cognition: Did the  patient need help planning regular tasks such as shopping or remembering to take medications? Independent  Patient Information Are you of Hispanic, Latino/a,or Spanish origin?: A. No, not of Hispanic, Latino/a, or Spanish origin What is your race?: A. White Do you need or want an interpreter to communicate with a doctor or health care staff?: 0. No  Patient's Response To:  Health Literacy and Transportation Is the patient able to respond to health literacy and transportation needs?: Yes Health Literacy - How often do you need to have someone help you when you read instructions, pamphlets, or other written material from your doctor or pharmacy?: Never In the past 12 months, has lack of transportation kept you from medical appointments or from getting medications?: No In the past 12 months, has lack of transportation kept you from meetings, work, or from getting things needed for daily living?: No  Home Assistive Devices / Equipment Home Assistive Devices/Equipment: Eyeglasses Home Equipment: Rolling Walker (2 wheels)  Prior Device Use: Indicate devices/aids used by the patient prior to current illness, exacerbation or injury? Walker  Current Functional Level Cognition  Arousal/Alertness: Awake/alert Overall Cognitive Status: Within Functional Limits for tasks assessed Orientation Level: Oriented X4 General Comments: Moments with fear of falling but benefits from calming cues. Very motivated    Extremity Assessment (includes Sensation/Coordination)  Upper Extremity Assessment: RUE deficits/detail RUE Deficits / Details: No shoulder ROM. cleared for AROM of hand, wrist, and elbow. Wear sling unless performing ADLs; keep elbow close to body when sling off.  k-tape on the dorsal portion of her R hand and along each finger looks intact and no redness noted. RUE Sensation: WNL RUE Coordination: decreased gross motor LUE Deficits / Details: Noting edema at L forearm  Lower Extremity  Assessment: Defer to PT evaluation RLE Deficits / Details: Increased edema, pt able to perform LAQ, ankle dorsiflexion 3/5 LLE Deficits / Details: Increased edema. Pt able to perform limited heel slide and SLR. Ankle dorsiflexion 2/5    ADLs  Overall ADL's : Needs assistance/impaired Eating/Feeding: Minimal assistance, Sitting Grooming: Minimal assistance, Sitting Upper Body Bathing: Moderate assistance, Sitting Upper Body Bathing Details (indicate cue type and reason): Providing cues and education for compensatory techniques. Pt able to wash under R axillary area with assistance to   maintain limited shoulder movement. Pt washing L axillary area by reaching LUE under as cued. Assist for washing her back. Lower Body Bathing: Maximal assistance, Sit to/from stand Lower Body Bathing Details (indicate cue type and reason): Pt washing anterior peri area while seated and leaning back. Assistance to wash posterior peri area while in standing. Cues for weight shifting forward to toes to counter act posterior lean. Min A for power up into standing Upper Body Dressing : Maximal assistance, Sitting Upper Body Dressing Details (indicate cue type and reason): Max A for donning new gown and donning sling. Lower Body Dressing: Maximal assistance Lower Body Dressing Details (indicate cue type and reason): donned socks Toilet Transfer: Moderate assistance, Stand-pivot (simulated to recliner) Toilet Transfer Details (indicate cue type and reason): Mod A for balance and posterior lean Toileting- Clothing Manipulation and Hygiene: Maximal assistance, +2 for physical assistance, Sit to/from stand Toileting - Clothing Manipulation Details (indicate cue type and reason): Max A for peri care. Pt with moderate blood in stool. Second person to maintain balance Functional mobility during ADLs: Moderate assistance (stand pivot only) General ADL Comments: Pt performing UB bathing, LB bathing, and dressing. Stand pivot to  recliner. Providing cues for weight shift forward in standing - and pt demonstrating improved balance with good rehab potentional. Fatigued at end of session but still motivated to practice with PT    Mobility  Overal bed mobility: Needs Assistance Bed Mobility: Supine to Sit Supine to sit: Min assist Sit to supine: Max assist General bed mobility comments: Pt is OOB in the recliner chair.    Transfers  Overall transfer level: Needs assistance Equipment used: 1 person hand held assist Transfers: Sit to/from Stand Sit to Stand: Mod assist, +2 physical assistance Bed to/from chair/wheelchair/BSC transfer type:: Step pivot Step pivot transfers: Mod assist, +2 physical assistance General transfer comment: Two person mod assist to take steps from recliner to BSC and back to recliner, wide BOS and painful stepping over R LE (d/t pelvic fx).    Ambulation / Gait / Stairs / Wheelchair Mobility  Ambulation/Gait General Gait Details: pt too fatigued after toileting for attempts at gait.    Posture / Balance Dynamic Sitting Balance Sitting balance - Comments: needs L UE support to maintain sitting in recliner with back off of backrest Balance Overall balance assessment: Needs assistance Sitting-balance support: Feet supported, Single extremity supported Sitting balance-Leahy Scale: Poor Sitting balance - Comments: needs L UE support to maintain sitting in recliner with back off of backrest Postural control: Posterior lean Standing balance support: Single extremity supported Standing balance-Leahy Scale: Poor Standing balance comment: does better with one hand supported on something solid like bed rail or back of locked chair.  May trial hemi walker next session.  She is just so painful when attempting to WB on her right foot.    Special needs/care consideration Palliative consulted for GOC on 11/18   Previous Home Environment  Living Arrangements: Alone  Lives With: Alone Available Help  at Discharge: Family, Available 24 hours/day Type of Home: House Home Layout: One level Home Access: Stairs to enter Entrance Stairs-Rails: Right Entrance Stairs-Number of Steps: 2 Bathroom Shower/Tub: Walk-in shower Bathroom Toilet: Standard Bathroom Accessibility: Yes How Accessible: Accessible via walker Home Care Services: No  Discharge Living Setting Plans for Discharge Living Setting: Lives with (comment) (to live with daughter, [name]) Type of Home at Discharge: House Discharge Home Layout: One level Discharge Home Access: Stairs to enter Entrance Stairs-Rails: Right, Left, Can reach both   Entrance Stairs-Number of Steps: 3 to 4 Discharge Bathroom Shower/Tub: Tub/shower unit Discharge Bathroom Toilet: Standard Discharge Bathroom Accessibility: Yes How Accessible: Accessible via walker Does the patient have any problems obtaining your medications?: No  Social/Family/Support Systems Patient Roles: Parent Contact Information: daughter Dee and daughter, [name] Anticipated Caregiver: [name] (dtr) Anticipated Caregiver's Contact Information: see contacts Ability/Limitations of Caregiver: Pt dtr [name] will be primary caregiver. [name] is currenlty using a walker due to knee issues.  Dee will help when able as she works during the day. Caregiver Availability: 24/7 Discharge Plan Discussed with Primary Caregiver: Yes Is Caregiver In Agreement with Plan?: Yes Does Caregiver/Family have Issues with Lodging/Transportation while Pt is in Rehab?: No  [name] only uses walker in the community as needed. In home she does not.  Goals Patient/Family Goal for Rehab: supervision to min assist with PT and OT Expected length of stay: ELOS 10 to 12 days Pt/Family Agrees to Admission and willing to participate: Yes Program Orientation Provided & Reviewed with Pt/Caregiver Including Roles  & Responsibilities: Yes  Decrease burden of Care through IP rehab admission: n/a  Possible need  for SNF placement upon discharge: not anticipated  Patient Condition: I have reviewed medical records from [facility], spoken with CM, and patient and daughter. I met with patient at the bedside for inpatient rehabilitation assessment.  Patient will benefit from ongoing PT and OT, can actively participate in 3 hours of therapy a day 5 days of the week, and can make measurable gains during the admission.  Patient will also benefit from the coordinated team approach during an Inpatient Acute Rehabilitation admission.  The patient will receive intensive therapy as well as Rehabilitation physician, nursing, social worker, and care management interventions.  Due to bladder management, bowel management, safety, skin/wound care, disease management, medication administration, pain management, and patient education the patient requires 24 hour a day rehabilitation nursing.  The patient is currently Mod A with mobility and basic ADLs.  Discharge setting and therapy post discharge at home with home health is anticipated.  Patient has agreed to participate in the Acute Inpatient Rehabilitation Program and will admit today.  Preadmission Screen Completed By:  Barbara Boyette RN MSN with updates by Brayson Livesey B Shavona Gunderman, 10/18/2021 11:20 AM with updates by Meline Russaw, MS, CCC-SLP  ______________________________________________________________________   Discussed status with Dr. Kirsteins  on 10/18/21  at 1000 and received approval for admission today.  Admission Coordinator: Barbara Boyette RN MSN with updates by Evangelyn Crouse B Bernardo Brayman, CCC-SLP, time 1120 /Date 10/18/21   Assessment/Plan: Diagnosis:Rigth proximal Humeral fracture, multiple pelvic fracture Does the need for close, 24 hr/day Medical supervision in concert with the patient's rehab needs make it unreasonable for this patient to be served in a less intensive setting? Yes Co-Morbidities requiring supervision/potential complications: PAF on Eliquis, SSS s/p  PPM, CKD3 Due to bladder management, bowel management, safety, skin/wound care, disease management, medication administration, pain management, and patient education, does the patient require 24 hr/day rehab nursing? Yes Does the patient require coordinated care of a physician, rehab nurse, PT, OT, and SLP to address physical and functional deficits in the context of the above medical diagnosis(es)? Yes Addressing deficits in the following areas: balance, endurance, locomotion, strength, transferring, bowel/bladder control, bathing, dressing, feeding, grooming, toileting, cognition, and psychosocial support Can the patient actively participate in an intensive therapy program of at least 3 hrs of therapy 5 days a week? Yes The potential for patient to make measurable gains while on inpatient rehab is good   Anticipated functional outcomes upon discharge from inpatient rehab: supervision PT, supervision OT, supervision SLP Estimated rehab length of stay to reach the above functional goals is: 10-12d Anticipated discharge destination: Home 10. Overall Rehab/Functional Prognosis: good   MD Signature: Andrew E. Kirsteins M.D. [facility] Fellow Am Acad of Phys Med and Rehab Diplomate Am Board of Electrodiagnostic Med Fellow Am Board of Interventional Pain  

## 2021-10-19 DIAGNOSIS — S32301D Unspecified fracture of right ilium, subsequent encounter for fracture with routine healing: Secondary | ICD-10-CM

## 2021-10-19 LAB — CBC WITH DIFFERENTIAL/PLATELET
Abs Immature Granulocytes: 0.21 10*3/uL — ABNORMAL HIGH (ref 0.00–0.07)
Basophils Absolute: 0.1 10*3/uL (ref 0.0–0.1)
Basophils Relative: 0 %
Eosinophils Absolute: 0.2 10*3/uL (ref 0.0–0.5)
Eosinophils Relative: 1 %
HCT: 29 % — ABNORMAL LOW (ref 36.0–46.0)
Hemoglobin: 9.2 g/dL — ABNORMAL LOW (ref 12.0–15.0)
Immature Granulocytes: 1 %
Lymphocytes Relative: 12 %
Lymphs Abs: 1.8 10*3/uL (ref 0.7–4.0)
MCH: 31.5 pg (ref 26.0–34.0)
MCHC: 31.7 g/dL (ref 30.0–36.0)
MCV: 99.3 fL (ref 80.0–100.0)
Monocytes Absolute: 1.4 10*3/uL — ABNORMAL HIGH (ref 0.1–1.0)
Monocytes Relative: 10 %
Neutro Abs: 10.9 10*3/uL — ABNORMAL HIGH (ref 1.7–7.7)
Neutrophils Relative %: 76 %
Platelets: 367 10*3/uL (ref 150–400)
RBC: 2.92 MIL/uL — ABNORMAL LOW (ref 3.87–5.11)
RDW: 15 % (ref 11.5–15.5)
WBC: 14.5 10*3/uL — ABNORMAL HIGH (ref 4.0–10.5)
nRBC: 0 % (ref 0.0–0.2)

## 2021-10-19 LAB — COMPREHENSIVE METABOLIC PANEL
ALT: 29 U/L (ref 0–44)
AST: 26 U/L (ref 15–41)
Albumin: 2.3 g/dL — ABNORMAL LOW (ref 3.5–5.0)
Alkaline Phosphatase: 103 U/L (ref 38–126)
Anion gap: 7 (ref 5–15)
BUN: 29 mg/dL — ABNORMAL HIGH (ref 8–23)
CO2: 22 mmol/L (ref 22–32)
Calcium: 8.5 mg/dL — ABNORMAL LOW (ref 8.9–10.3)
Chloride: 107 mmol/L (ref 98–111)
Creatinine, Ser: 0.97 mg/dL (ref 0.44–1.00)
GFR, Estimated: 57 mL/min — ABNORMAL LOW (ref >60)
Glucose, Bld: 93 mg/dL (ref 70–99)
Potassium: 5.3 mmol/L — ABNORMAL HIGH (ref 3.5–5.1)
Sodium: 136 mmol/L (ref 135–145)
Total Bilirubin: 0.5 mg/dL (ref 0.3–1.2)
Total Protein: 5 g/dL — ABNORMAL LOW (ref 6.5–8.1)

## 2021-10-19 NOTE — Progress Notes (Signed)
PROGRESS NOTE   Subjective/Complaints:  Some abd cramping, mushy BM x 2 , no blood noted  ROS- no CP, SOB, N/V/D Objective:   No results found. Recent Labs    10/18/21 0921 10/19/21 0504  WBC 9.4 14.5*  HGB 8.5* 9.2*  HCT 25.7* 29.0*  PLT 341 367   Recent Labs    10/16/21 0851 10/19/21 0504  NA 135 136  K 4.6 5.3*  CL 107 107  CO2 21* 22  GLUCOSE 93 93  BUN 32* 29*  CREATININE 0.98 0.97  CALCIUM 7.9* 8.5*    Intake/Output Summary (Last 24 hours) at 10/19/2021 0718 Last data filed at 10/19/2021 0440 Gross per 24 hour  Intake --  Output 950 ml  Net -950 ml        Physical Exam: Vital Signs Blood pressure (!) 160/50, pulse 99, temperature 98.1 F (36.7 C), temperature source Oral, resp. rate 16, height 4\' 8"  (1.422 m), weight 49.8 kg, SpO2 100 %.   HEENT Extensive facial echymoses, no eye d/c , good lid closure General: No acute distress Mood and affect are appropriate Heart: Regular rate and rhythm no rubs murmurs or extra sounds Lungs: Clear to auscultation, breathing unlabored, no rales or wheezes Abdomen: Positive bowel sounds, soft nontender to palpation, nondistended Extremities: No LE edema Skin: No evidence of breakdown, no evidence of rash, mult facial ecchymoses Neurologic: Cranial nerves II through XII intact, motor strength is 5/5 in left deltoid, bicep, tricep, grip, 4/5 Hip flexor, knee extensors, ankle dorsiflexor and plantar flexor Sensory exam normal sensation to light touch  in bilateral upper and lower extremities   Musculoskeletal: RUE in sling has good grip , color and temp Unchanged exam  Assessment/Plan: 1. Functional deficits which require 3+ hours per day of interdisciplinary therapy in a comprehensive inpatient rehab setting. Physiatrist is providing close team supervision and 24 hour management of active medical problems listed below. Physiatrist and rehab team continue to  assess barriers to discharge/monitor patient progress toward functional and medical goals  Care Tool:  Bathing              Bathing assist       Upper Body Dressing/Undressing Upper body dressing        Upper body assist      Lower Body Dressing/Undressing Lower body dressing      What is the patient wearing?: Incontinence brief     Lower body assist Assist for lower body dressing: Total Assistance - Patient < 25%     Toileting Toileting    Toileting assist Assist for toileting: Total Assistance - Patient < 25%     Transfers Chair/bed transfer  Transfers assist           Locomotion Ambulation   Ambulation assist              Walk 10 feet activity   Assist           Walk 50 feet activity   Assist           Walk 150 feet activity   Assist           Walk 10 feet on uneven surface  activity   Assist           Wheelchair     Assist               Wheelchair 50 feet with 2 turns activity    Assist            Wheelchair 150 feet activity     Assist          Blood pressure (!) 160/50, pulse 99, temperature 98.1 F (36.7 C), temperature source Oral, resp. rate 16, height 4\' 8"  (1.422 m), weight 49.8 kg, SpO2 100 %.  Medical Problem List and Plan: 1. Functional deficits secondary to poly trauma, pelvic and humeral fracture             -patient may Not shower             -ELOS/Goals: 10-12d sup to min A 2.  Antithrombotics: -DVT/anticoagulation:  Mechanical: Sequential compression devices, below knee Bilateral lower extremities.              -antiplatelet therapy: N/A 3. Pain Management: Hydrocodone 3 times daily with as needed oxycodone for breakthrough pain 4. Mood: LCSW to follow for evaluation and support             -antipsychotic agents: N/AA 5. Neuropsych: This patient is capable of making decisions on her own behalf. 6. Skin/Wound Care: Routine pressure-relief measures. 7.  Fluids/Electrolytes/Nutrition: Monitor I/O. Check CMET in am. 8. PAF: Off eliquis. Monitor Heart rate TID on coreg BID. 9. HTN: Monitor BP TID-on Norvasc, Coreg and lisinopril Vitals:   10/18/21 2039 10/19/21 0443  BP: (!) 126/39 (!) 160/50  Pulse: 80 99  Resp: 15 16  Temp: 99 F (37.2 C) 98.1 F (36.7 C)  SpO2: 98% 366%   Some systolic lability cont to monitor  10. Recurrent hyperkalemia: Likely due to ACE--Discontinue Lisinopril?? 11. PMR: Followed by Dr.Beekman and has been managed with medrol 4 mg daily.  12. Right humerus fracture: NWB RUE with sling at all times.  --May doff sling for ADLs with right forearm close to body. 13. Urinary retention: Continues to require I/O caths--last for 400- 800 cc. --monitor voiding with PVR/bladder scan and cath to keep volumes < 400 cc.  14. Constipation: Last BM documented on 11/21 as smal and red. Only smears since then --Will start on laxatives to prevent recurrent constipation/bleeding.   15. Nausea: Continue zofran BID. Marland Kitchen   16.  Leukocytosis , afeb on medrol for PMR  LOS: 1 days A FACE TO FACE EVALUATION WAS PERFORMED  Charlett Blake 10/19/2021, 7:18 AM

## 2021-10-19 NOTE — Progress Notes (Signed)
Acute chart reviewed again. Patient was NOT on ASA or allopurinol on acute-->will continue to hold ASA due to recent  LGIB. She continues on stress dose of 4 mg daily and will need to decide on wean to home dose of 2 mg/daily.

## 2021-10-19 NOTE — Progress Notes (Addendum)
Inpatient Rehabilitation Admission Medication Review by a Pharmacist - Follow Up  A complete drug regimen review was completed for this patient to identify any potential clinically significant medication issues.  High Risk Drug Classes Is patient taking? Indication by Medication  Antipsychotic Yes PRN compazine for N/V  Anticoagulant No   Antibiotic No   Opioid Yes Hydrocodone-APAP for pain PRN oxycodone for pain  Antiplatelet No   Hypoglycemics/insulin No   Vasoactive Medication Yes Amlodipine, coreg, hydralazine, lisinopril for HTN  Chemotherapy No   Other Yes Synthroid for hypothyroidism Atorvastatin for HLD Methylprednisolone for polymyalgia rheumatica PRN trazodone for sleep     Type of Medication Issue Identified Description of Issue Recommendation(s)  Drug Interaction(s) (clinically significant)     Duplicate Therapy     Allergy     No Medication Administration End Date     Incorrect Dose  Methylprednisolone 2mg  on dc summary but 4mg  on CIR Per chart review and discussion with Pam Love,  continue on stress dose of methylprednisolone 4 mg daily and will need to decide on wean to home dose of 2 mg daily.   Additional Drug Therapy Needed  ASA, allopurinol on dc summary but not on CIR Per chart review and discussion with Algis Liming, patient was not on aspirin or allopurinol on acute. Plans noted to continue holding aspirin due to recent LGIB.  Significant med changes from prior encounter (inform family/care partners about these prior to discharge).    Other       Clinically significant medication issues were identified that warrant physician communication and completion of prescribed/recommended actions by midnight of the next day:  Yes  Name of provider notified for urgent issues identified: Algis Liming, PA  Provider Method of Notification: secure chat   Pharmacist comments: Need to clarify about dose and discrepancy meds on transfer. Per chart review and discussion with  Algis Liming, PA - patient was not on aspirin or allopurinol on acute. Plans noted to continue holding aspirin due to recent LGIB. Plans also noted to continue on stress dose of methylprednisolone 4 mg daily and will need to decide on wean to home dose of 2 mg daily.   Time spent performing this drug regimen review (minutes): 15  Vance Peper, PharmD PGY1 Pharmacy Resident 10/19/2021 7:28 AM   Please check AMION for all [facility] phone numbers After 10:00 PM, call [place] [phone]

## 2021-10-19 NOTE — Evaluation (Addendum)
Occupational Therapy Assessment and Plan  Patient Details  Name: [name] MRN: [id number] Date of Birth: 1932-08-31  OT Diagnosis: acute pain and muscle weakness (generalized) Rehab Potential: Rehab Potential (ACUTE ONLY): Fair ELOS: 2.5 weeks   Today's Date: 10/20/2021 OT Individual Time: 1601-0932 OT Individual Time Calculation (min): 7 min     Hospital Problem: Principal Problem:   Pelvic fracture ([place])   Past Medical History:  Past Medical History:  Diagnosis Date   Allergic rhinitis    Anemia    iron deficient   Aneurysm ([facility])    Anxiety    Basal cell carcinoma    Chronic anemia    Chronic low back pain    Colon polyps    GERD (gastroesophageal reflux disease)    Hiatal hernia    HTN (hypertension)    Hypercholesteremia    Hyperlipidemia    Hypothyroidism    Insomnia    Left heart failure ([place])    Osteoporosis    S/P forteo treatment as of 02/2009   Pacemaker    Polymyalgia ([place])    rheumatica-steroids per Rheum   Systolic heart failure    Past Surgical History:  Past Surgical History:  Procedure Laterality Date   ABDOMINAL HYSTERECTOMY     ANGIOPLASTY     BACK SURGERY     CARDIAC CATHETERIZATION  10/16/06   CATARACT EXTRACTION     defibulator implant     EP IMPLANTABLE DEVICE N/A 04/10/2016   Procedure: BiV Pacemaker Insertion CRT-P;  Surgeon: Evans Lance, MD;  Location: [facility];  Service: Cardiovascular;  Laterality: N/A;   FOOT SURGERY     MITRAL VALVE REPAIR     skin cancer removal     THYROIDECTOMY     TONSILLECTOMY      Assessment & Plan Clinical Impression: Patient is a 87 y.o. year old female with recent admission to the hospital on history of HTN, SSS/PAF, NICM, CKD III, PMR-chronic steriods who was originally admitted to [facility] on 10/12/21 with AKI, anemia, fall and subsequent right proximal humerus fracture and right pelvic fractures.  She was IV antibiotics for UTI and concerns of sepsis, IV fluids for hydration, Lokelma  for hyperkalemia, Foley placement for urinary retention as well as 1 unit PRBC for anemia.  She was transferred to this CIR on 10/06/2021 for intensive rehab program to consist of PT and OT at least three hours per day.  She has had issues with nausea as well as constipation requiring adjustment of bowel program.  She did have worsening of renal status requiring IV fluids as well as recurrent hyperkalemia requiring Lokelma.  Foley was kept in place due to urinary retention and immobility issues.  Pain control is improving with scheduled narcotics.  She did develop rectal bleeding on early a.m. of 11/17 followed by drop in H&H to 6.2.  She was typed and crossed for 2 units PRBC and GI was consulted for input.     On 11/17, She was transferred to acute floor for work-up and closer monitoring.  Dr. Paulita Fujita recommended serial CBC and supportive care with hydration and monitoring as patient was not a candidate for colonoscopy due to multiple comorbidities.  He also recommended tagged RBC study was a CTA if patient had rebleeding.  CT abdomen pelvis was negative for acute changes.  Foley catheter was removed and renal status was monitored.  As H&H was stable and no recurrent rectal bleeding noted, GI signed off.  Palliative care consulted to  discuss goals of care and patient elected on full scope of care.  Zofran was scheduled twice daily to help with nausea in addition to hydrocodone 3 times daily for pain management.  She has had recurrent hyperkalemia requiring additional dose of Lokelma.  Therapy was resumed and patient continues to be limited by weakness, RU E weight bearing restrictions, pain, fatigue and balance deficits affecting mobility as well as ADLs.  CIR recommended due to functional decline. Patient transferred to CIR on 10/18/2021 .    Patient currently requires max/total Awith basic self-care skills secondary to muscle weakness, decreased cardiorespiratoy endurance, and decreased sitting balance,  decreased standing balance, decreased postural control, decreased balance strategies, and difficulty maintaining precautions.  Prior to hospitalization, patient could complete BADL with modified independent .  Patient will benefit from skilled intervention to decrease level of assist with basic self-care skills prior to discharge home with care partner.  Anticipate patient will require minimal physical assistance and follow up home health.  OT - End of Session Endurance Deficit: Yes Endurance Deficit Description: Multiple rest breaks within BADL tasks OT Assessment Rehab Potential (ACUTE ONLY): Fair OT Barriers to Discharge: Decreased caregiver support;Inaccessible home environment OT Barriers to Discharge Comments: 1 STE OT Patient demonstrates impairments in the following area(s): Balance;Cognition;Endurance;Motor;Pain;Safety;Skin Integrity OT Basic ADL's Functional Problem(s): Eating;Toileting;Dressing;Grooming;Bathing OT Transfers Functional Problem(s): Toilet OT Plan OT Intensity: Minimum of 1-2 x/day, 45 to 90 minutes OT Frequency: 5 out of 7 days OT Duration/Estimated Length of Stay: 2.5 weeks OT Treatment/Interventions: Balance/vestibular training;Cognitive remediation/compensation;Community reintegration;Discharge planning;DME/adaptive equipment instruction;Functional mobility training;Pain management;Patient/family education;Psychosocial support;Self Care/advanced ADL retraining;Skin care/wound managment;Therapeutic Activities;Therapeutic Exercise;UE/LE Strength taining/ROM;UE/LE Coordination activities;Visual/perceptual remediation/compensation;Splinting/orthotics OT Self Feeding Anticipated Outcome(s): Set-up OT Basic Self-Care Anticipated Outcome(s): Min A OT Toileting Anticipated Outcome(s): Min A OT Bathroom Transfers Anticipated Outcome(s): CGA OT Recommendation Patient destination: Home (vs SNF) Follow Up Recommendations: Home health OT Equipment Recommended: To be  determined   OT Evaluation Precautions/Restrictions  Precautions Precautions: Fall Precaution Comments: High fall risk Required Braces or Orthoses: Sling Restrictions Weight Bearing Restrictions: Yes RUE Weight Bearing: Non weight bearing RLE Weight Bearing: Weight bearing as tolerated LLE Weight Bearing: Weight bearing as tolerated Other Position/Activity Restrictions: Sling at all times except for during ADLs; keep elbow close to body when not in sling. No PROM/AROM at R shoulder for 2-3 weeks Pain  Patient reports 6/10 pain in R hip and R arm. Rest and repositioned for comfort. Home Living/Prior Functioning Home Living Available Help at Discharge: Family, Available 24 hours/day Type of Home: House Home Access: Stairs to enter [employer] of Steps: 2 Entrance Stairs-Rails: Right Home Layout: One level Bathroom Shower/Tub: Multimedia programmer: Programmer, systems: Yes  Lives With: Alone IADL History Occupation: Retired Prior Function Level of Independence: Independent with basic ADLs, Independent with homemaking with ambulation, Independent with transfers  Able to Take Stairs?: Yes Driving: Yes Vision Baseline Vision/History: 1 Wears glasses Ability to See in Adequate Light: 0 Adequate Patient Visual Report: Other (comment) (no c/o.) Vision Assessment?: Vision impaired- to be further tested in functional context Perception  Perception: Not tested Praxis Praxis: Not tested Cognition Overall Cognitive Status: Within Functional Limits for tasks assessed Arousal/Alertness: Awake/alert Orientation Level: Person;Place;Situation Person: Oriented Place: Oriented Situation: Oriented Year: 2022 Month: November Day of Week: Correct Memory: Impaired Immediate Memory Recall: Sock;Blue;Bed Memory Impairment: Decreased recall of new information Memory Recall Sock: Without Cue Memory Recall Blue: Without Cue Memory Recall Bed: Not able to  recall Safety/Judgment: Appears intact Sensation Sensation Light  Touch: Appears Intact Coordination Fine Motor Movements are Fluid and Coordinated: No Coordination and Movement Description: Decreased coordination bilaterally Finger Nose Finger Test: Over shoots on L. Unable to assess on R secondary to precautions. Motor  Motor Motor: Within Functional Limits Motor - Skilled Clinical Observations: slow movement of B LEs  Trunk/Postural Assessment     Balance Balance Balance Assessed: Yes Static Sitting Balance Static Sitting - Level of Assistance: 5: Stand by assistance Dynamic Sitting Balance Dynamic Sitting - Balance Support: During functional activity Dynamic Sitting - Level of Assistance: 4: Min assist Static Standing Balance Static Standing - Balance Support: During functional activity Static Standing - Level of Assistance: 4: Min assist Dynamic Standing Balance Dynamic Standing - Balance Support: During functional activity Dynamic Standing - Level of Assistance: 4: Min assist Extremity/Trunk Assessment RUE Assessment RUE Assessment: Exceptions to [facility] Passive Range of Motion (PROM) Comments: DNT at shoulder 2/2 precautions Active Range of Motion (AROM) Comments: DNT at shoulder 2/2 precautions; limited elbow flexion and wrist/digit flexion/extension secondary to pain. General Strength Comments: Gross grasp 3-/5 LUE Assessment LUE Assessment: Within Functional Limits Active Range of Motion (AROM) Comments: Limited AROM at shoulder General Strength Comments: Generalized weakness  Care Tool Care Tool Self Care Eating   Eating Assist Level: Set up assist    Oral Care    Oral Care Assist Level: Moderate Assistance - Patient 50 - 74%    Bathing   Body parts bathed by patient: Chest;Abdomen;Face Body parts bathed by helper: Right arm;Left arm;Front perineal area;Right upper leg;Buttocks;Left upper leg;Right lower leg;Left lower leg   Assist Level: Maximal Assistance -  Patient 24 - 49%    Upper Body Dressing(including orthotics)   What is the patient wearing?: Button up shirt   Assist Level: Total Assistance - Patient < 25%    Lower Body Dressing (excluding footwear)   What is the patient wearing?: Incontinence brief;Pants Assist for lower body dressing: Total Assistance - Patient < 25%    Putting on/Taking off footwear   What is the patient wearing?: Non-skid slipper socks Assist for footwear: Total Assistance - Patient < 25%       Care Tool Toileting Toileting activity   Assist for toileting: Total Assistance - Patient < 25%     Care Tool Bed Mobility Roll left and right activity   Roll left and right assist level: Moderate Assistance - Patient 50 - 74%    Sit to lying activity        Lying to sitting on side of bed activity   Lying to sitting on side of bed assist level: the ability to move from lying on the back to sitting on the side of the bed with no back support.: Maximal Assistance - Patient 25 - 49%     Care Tool Transfers Sit to stand transfer   Sit to stand assist level: Moderate Assistance - Patient 50 - 74%    Chair/bed transfer   Chair/bed transfer assist level: Moderate Assistance - Patient 50 - 74%     Toilet transfer   Assist Level: Moderate Assistance - Patient 50 - 74%     Care Tool Cognition  Expression of Ideas and Wants Expression of Ideas and Wants: 4. Without difficulty (complex and basic) - expresses complex messages without difficulty and with speech that is clear and easy to understand  Understanding Verbal and Non-Verbal Content Understanding Verbal and Non-Verbal Content: 4. Understands (complex and basic) - clear comprehension without cues or repetitions   Memory/Recall  Ability Memory/Recall Ability : Current season   Refer to Care Plan for Long Term Goals  SHORT TERM GOAL WEEK 1 OT Short Term Goal 1 (Week 1): Patient will complete 1 step of UB dressing task OT Short Term Goal 2 (Week 1): Patient  will complete 1 step of LB dressing task using LRAD OT Short Term Goal 3 (Week 1): Patient will tolerate standing for 2 minutes in preparation for BADL Task  Recommendations for other services: None    Skilled Therapeutic Intervention Pt greeted sitting in bed finishing breakfast and agreeable to OT eval and treat. OT eval completed addressing rehab process, OT purpose, POC, ELOS, and goals. Pt incontinent of loose BM requiring total A for peri-care, brief change, and LB bathing at bed level. Mod/max A for rolling. Pt needed max A to get to sitting EOB for more bathing/dressing tasks. OT removed kinesiotape from R hand using adhesive remover and no adverse skin reactions noted. Overall max/total A for UB bathing/dressing due to painful movement of R UE and guarding due to this pain. Sit<>stands and stand-pivot to wc with overall mod HHA. Pt incontinent of more BM after transfer. Pt tolerated standing for 1 minute with min A for balance while OT provided total A for peri-care and brief change. Pt left seated in wc at end of session with alarm belt on, call bell in reach, and needs met.   ADL ADL Eating: Set up Grooming: Moderate assistance Upper Body Bathing: Maximal assistance Lower Body Bathing: Dependent Upper Body Dressing: Dependent Lower Body Dressing: Dependent Toileting: Dependent Toilet Transfer: Moderate assistance Mobility  Bed Mobility Bed Mobility: Supine to Sit Supine to Sit: Maximal Assistance - Patient - Patient 25-49% Transfers Sit to Stand: Moderate Assistance - Patient 50-74% Stand to Sit: Moderate Assistance - Patient 50-74%   Discharge Criteria: Patient will be discharged from OT if patient refuses treatment 3 consecutive times without medical reason, if treatment goals not met, if there is a change in medical status, if patient makes no progress towards goals or if patient is discharged from hospital.  The above assessment, treatment plan, treatment alternatives and  goals were discussed and mutually agreed upon: by patient  [name] 10/20/2021, 8:51 AM

## 2021-10-20 NOTE — Evaluation (Signed)
Physical Therapy Assessment and Plan  Patient Details  Name: [name] MRN: [id number] Date of Birth: 19-May-1932  PT Diagnosis: Difficulty walking and Muscle weakness Rehab Potential: Poor ELOS: 2-3 weeks   Today's Date: 10/20/2021 PT Individual Time: 9767-3419 PT Individual Time Calculation (min): 70 min    Hospital Problem: Principal Problem:   Pelvic fracture ([place])   Past Medical History:  Past Medical History:  Diagnosis Date   Allergic rhinitis    Anemia    iron deficient   Aneurysm ([place])    Anxiety    Basal cell carcinoma    Chronic anemia    Chronic low back pain    Colon polyps    GERD (gastroesophageal reflux disease)    Hiatal hernia    HTN (hypertension)    Hypercholesteremia    Hyperlipidemia    Hypothyroidism    Insomnia    Left heart failure ([place])    Osteoporosis    S/P forteo treatment as of 02/2009   Pacemaker    Polymyalgia ([place])    rheumatica-steroids per Rheum   Systolic heart failure    Past Surgical History:  Past Surgical History:  Procedure Laterality Date   ABDOMINAL HYSTERECTOMY     ANGIOPLASTY     BACK SURGERY     CARDIAC CATHETERIZATION  10/16/06   CATARACT EXTRACTION     defibulator implant     EP IMPLANTABLE DEVICE N/A 04/10/2016   Procedure: BiV Pacemaker Insertion CRT-P;  Surgeon: Evans Lance, MD;  Location: [facility];  Service: Cardiovascular;  Laterality: N/A;   FOOT SURGERY     MITRAL VALVE REPAIR     skin cancer removal     THYROIDECTOMY     TONSILLECTOMY      Assessment & Plan Clinical Impression: Patient is an 87 year old female with history of HTN, SSS/PAF, NICM, CKD III, PMR-chronic steriods who was originally admitted to [facility] on 10/12/21 with AKI, anemia, fall and subsequent right proximal humerus fracture and right pelvic fractures.  She was IV antibiotics for UTI and concerns of sepsis, IV fluids for hydration, Lokelma for hyperkalemia, Foley placement for urinary retention as well as 1 unit PRBC for  anemia.  She was transferred to this CIR on 10/06/2021 for intensive rehab program to consist of PT and OT at least three hours per day.  She has had issues with nausea as well as constipation requiring adjustment of bowel program.  She did have worsening of renal status requiring IV fluids as well as recurrent hyperkalemia requiring Lokelma.  Foley was kept in place due to urinary retention and immobility issues.  Pain control is improving with scheduled narcotics.  She did develop rectal bleeding on early a.m. of 11/17 followed by drop in H&H to 6.2.  She was typed and crossed for 2 units PRBC and GI was consulted for input.     On 11/17, She was transferred to acute floor for work-up and closer monitoring.  Dr. Paulita Fujita recommended serial CBC and supportive care with hydration and monitoring as patient was not a candidate for colonoscopy due to multiple comorbidities.  He also recommended tagged RBC study was a CTA if patient had rebleeding.  CT abdomen pelvis was negative for acute changes.  Foley catheter was removed and renal status was monitored.  As H&H was stable and no recurrent rectal bleeding noted, GI signed off.  Palliative care consulted to discuss goals of care and patient elected on full scope of care.  Zofran was scheduled twice daily to help with nausea in addition to hydrocodone 3 times daily for pain management.  She has had recurrent hyperkalemia requiring additional dose of Lokelma.  Patient transferred to CIR on 10/18/2021 .   Patient currently requires mod with mobility secondary to muscle weakness, decreased cardiorespiratoy endurance, and decreased sitting balance, decreased standing balance, decreased postural control, and decreased balance strategies.  Prior to hospitalization, patient was independent  with mobility and lived with Alone in a House home.  Home access is 2Stairs to enter.  Patient will benefit from skilled PT intervention to maximize safe functional mobility, minimize  fall risk, and decrease caregiver burden for planned discharge home with 24 hour supervision.  Anticipate patient will benefit from follow up [place] at discharge.  PT - End of Session Endurance Deficit: Yes Endurance Deficit Description: Multiple rest breaks within BADL tasks   PT Evaluation Precautions/Restrictions Precautions Precautions: Fall Precaution Comments: High fall risk Required Braces or Orthoses: Sling Restrictions Weight Bearing Restrictions: Yes RUE Weight Bearing: Non weight bearing RLE Weight Bearing: Weight bearing as tolerated LLE Weight Bearing: Weight bearing as tolerated Other Position/Activity Restrictions: Sling at all times except for during ADLs; keep elbow close to body when not in sling. No PROM/AROM at R shoulder for 2-3 weeks General Chart Reviewed: Yes Family/Caregiver Present: No  Pain Interference Pain Interference Pain Effect on Sleep: 2. Occasionally Pain Interference with Therapy Activities: 2. Occasionally Pain Interference with Day-to-Day Activities: 3. Frequently Home Living/Prior Functioning Home Living Available Help at Discharge: Family;Available 24 hours/day Type of Home: House Home Access: Stairs to enter [employer] of Steps: 2 Entrance Stairs-Rails: Left Home Layout: One level Bathroom Shower/Tub: Multimedia programmer: Standard Bathroom Accessibility: Yes  Lives With: Alone Prior Function Level of Independence: Independent with basic ADLs;Independent with homemaking with ambulation;Independent with transfers  Able to Take Stairs?: Yes Driving: Yes Vision/Perception  Vision - History Ability to See in Adequate Light: 0 Adequate Perception Perception: Within Functional Limits Praxis Praxis: Intact  Cognition Overall Cognitive Status: Within Functional Limits for tasks assessed Arousal/Alertness: Awake/alert Orientation Level: Oriented X4 Year: 2022 Month: November Day of Week: Correct Memory:  Impaired Memory Impairment: Decreased recall of new information Memory Recall Sock: Without Cue Memory Recall Blue: Without Cue Memory Recall Bed: Not able to recall Safety/Judgment: Appears intact Sensation Sensation Light Touch: Appears Intact Coordination Gross Motor Movements are Fluid and Coordinated: No Fine Motor Movements are Fluid and Coordinated: No Coordination and Movement Description: Decreased coordination bilaterally Finger Nose Finger Test: Over shoots on L. Unable to assess on R secondary to precautions. Heel Shin Test: Limited on R due to pain and tightness Motor  Motor Motor: Within Functional Limits Motor - Skilled Clinical Observations: slow movement of B LEs   Trunk/Postural Assessment  Cervical Assessment Cervical Assessment:  (forward head) Thoracic Assessment Thoracic Assessment:  (rounded shoulders) Lumbar Assessment Lumbar Assessment:  (posterior pelvic tilt) Postural Control Postural Control: Deficits on evaluation Righting Reactions: Delayed; insufficient Protective Responses: Delayed; insufficient  Balance Balance Balance Assessed: Yes Static Sitting Balance Static Sitting - Balance Support: Left upper extremity supported Static Sitting - Level of Assistance: 5: Stand by assistance Dynamic Sitting Balance Dynamic Sitting - Balance Support: During functional activity Dynamic Sitting - Level of Assistance: 4: Min Insurance risk surveyor Standing - Balance Support: During functional activity Static Standing - Level of Assistance: 4: Min assist Dynamic Standing Balance Dynamic Standing - Balance Support: During functional activity Dynamic Standing - Level of Assistance:  4: Min assist Extremity Assessment  RUE Assessment RUE Assessment: Exceptions to [facility] Passive Range of Motion (PROM) Comments: DNT at shoulder 2/2 precautions Active Range of Motion (AROM) Comments: DNT at shoulder 2/2 precautions; limited elbow flexion and  wrist/digit flexion/extension secondary to pain. General Strength Comments: Gross grasp 3-/5 LUE Assessment LUE Assessment: Within Functional Limits Active Range of Motion (AROM) Comments: Limited AROM at shoulder General Strength Comments: Generalized weakness RLE Assessment RLE Assessment: Exceptions to [facility] General Strength Comments: Grossly 3+/5, pain associated with resistance LLE Assessment LLE Assessment: Exceptions to [facility] General Strength Comments: grossly 4+/5.  Care Tool Care Tool Bed Mobility Roll left and right activity   Roll left and right assist level: Moderate Assistance - Patient 50 - 74%    Sit to lying activity   Sit to lying assist level: Moderate Assistance - Patient 50 - 74%    Lying to sitting on side of bed activity   Lying to sitting on side of bed assist level: the ability to move from lying on the back to sitting on the side of the bed with no back support.: Maximal Assistance - Patient 25 - 49%     Care Tool Transfers Sit to stand transfer   Sit to stand assist level: Moderate Assistance - Patient 50 - 74%    Chair/bed transfer   Chair/bed transfer assist level: Moderate Assistance - Patient 50 - 74%     Toilet transfer   Assist Level: Moderate Assistance - Patient 50 - 74%    Car transfer   Car transfer assist level: Minimal Assistance - Patient > 75%      Care Tool Locomotion Ambulation   Assist level: Moderate Assistance - Patient 50 - 74% Assistive device: Hand held assist Max distance: 30'  Walk 10 feet activity   Assist level: Moderate Assistance - Patient - 50 - 74% Assistive device: Hand held assist   Walk 50 feet with 2 turns activity Walk 50 feet with 2 turns activity did not occur: Safety/medical concerns Assist level: Moderate Assistance - Patient - 50 - 74%    Walk 150 feet activity Walk 150 feet activity did not occur: Safety/medical concerns      Walk 10 feet on uneven surfaces activity Walk 10 feet on uneven surfaces  activity did not occur: Safety/medical concerns      Stairs   Assist level: Minimal Assistance - Patient > 75% Stairs assistive device: 1 hand rail Max number of stairs: 4  Walk up/down 1 step activity   Walk up/down 1 step (curb) assist level: Minimal Assistance - Patient > 75% Walk up/down 1 step or curb assistive device: 1 hand rail  Walk up/down 4 steps activity   Walk up/down 4 steps assist level: Minimal Assistance - Patient > 75% Walk up/down 4 steps assistive device: 1 hand rail  Walk up/down 12 steps activity Walk up/down 12 steps activity did not occur: Safety/medical concerns      Pick up small objects from floor Pick up small object from the floor (from standing position) activity did not occur: Safety/medical concerns      Wheelchair Is the patient using a wheelchair?: Yes Type of Wheelchair: Manual   Wheelchair assist level: Dependent - Patient 0% Max wheelchair distance: 150'  Wheel 50 feet with 2 turns activity   Assist Level: Dependent - Patient 0%  Wheel 150 feet activity   Assist Level: Dependent - Patient 0%    Refer to Care Plan for Long Term Goals  SHORT TERM GOAL WEEK 1    Recommendations for other services: None   Skilled Therapeutic Intervention  Evaluation completed (see details above and below) with education on PT POC and goals and individual treatment initiated with focus on bed mobility, balance, transfers, ambulation, and stair training. Pt received seated in WC and agrees to therapy. Reports pain in R pelvis and back. Number not provided. PT provides rest breaks and mobility as needed to manage pain. WC transport to gym for time management. Pt performs sit to stand with modA and cues for weight shifting, posture, and balance. Stand step transfer to new WC with modA and cues for positioning. Pt then performs car transfer with improving body mechanics and minA with cues for sequencing and positioning. Following seated rest break, pt ambulates x30'  with L HHA modA and cues for upright gaze to improve posture and balance. Pt takes extended seated rest break prior to completes x4 6" steps with L hand rail and minA with tactile cues at hips and trunk for posture, and verbal cues for step sequencing. Pt descends backward to be able to utilize L hand rail. PT educates on importance of upright gaze and posture to promote optimal body mechanics and balance. WC transport back to room. Left seated in WC with alarm intact and all needs within reach.   Mobility Bed Mobility Bed Mobility: Supine to Sit Supine to Sit: Maximal Assistance - Patient - Patient 25-49% Transfers Transfers: Sit to Stand;Stand to Sit;Stand Pivot Transfers Sit to Stand: Moderate Assistance - Patient 50-74% Stand to Sit: Moderate Assistance - Patient 50-74% Stand Pivot Transfers: Moderate Assistance - Patient 50 - 74% Locomotion  Gait Ambulation: Yes Gait Assistance: Moderate Assistance - Patient 50-74% Gait Distance (Feet): 30 Feet Assistive device: 1 person hand held assist Gait Assistance Details: Verbal cues for gait pattern;Verbal cues for technique;Tactile cues for placement;Tactile cues for sequencing;Verbal cues for sequencing Gait Gait: Yes Gait Pattern: Impaired Gait Pattern: Decreased stride length Gait velocity: Decreased Stairs / Additional Locomotion Stairs: Yes Stairs Assistance: Minimal Assistance - Patient > 75% Stair Management Technique: One rail Left Number of Stairs: 4 Height of Stairs: 6 Curb: Minimal Assistance - Patient >75%   Discharge Criteria: Patient will be discharged from PT if patient refuses treatment 3 consecutive times without medical reason, if treatment goals not met, if there is a change in medical status, if patient makes no progress towards goals or if patient is discharged from hospital.  The above assessment, treatment plan, treatment alternatives and goals were discussed and mutually agreed upon: by patient  Breck Coons, PT, DPT 10/20/2021, 1:57 PM

## 2021-10-20 NOTE — Progress Notes (Signed)
Note that BP soft this am and K+ trending back up again. Will d/c lisinopril for now. Recheck BMET in am.

## 2021-10-20 NOTE — IPOC Note (Signed)
Overall Plan of Care [facility]) Patient Details Name: [name] MRN: [id number] DOB: 09/21/1932  Admitting Diagnosis: Pelvic fracture [facility])  Hospital Problems: Principal Problem:   Pelvic fracture ([place])     Functional Problem List: Nursing Bladder, Bowel, Endurance, Medication Management, Pain, Safety, Skin Integrity  PT Balance, Endurance, Motor, Pain, Safety, Behavior, Skin Integrity  OT Balance, Cognition, Endurance, Motor, Pain, Safety, Skin Integrity  SLP    TR         Basic ADL's: OT Eating, Toileting, Dressing, Grooming, Bathing     Advanced  ADL's: OT       Transfers: PT Bed Mobility, Bed to Chair, Car, Chief Operating Officer: PT Ambulation, Stairs     Additional Impairments: OT    SLP        TR      Anticipated Outcomes Item Anticipated Outcome  Self Feeding Set-up  Swallowing      Basic self-care  Min A  Toileting  Min A   Bathroom Transfers CGA  Bowel/Bladder  min assist  Transfers  Supervision  Locomotion  CGA  Communication     Cognition     Pain  <3  Safety/Judgment  min assist and no falls   Therapy Plan: PT Intensity: Minimum of 1-2 x/day ,45 to 90 minutes PT Frequency: 5 out of 7 days PT Duration Estimated Length of Stay: 2-3 weeks OT Intensity: Minimum of 1-2 x/day, 45 to 90 minutes OT Frequency: 5 out of 7 days OT Duration/Estimated Length of Stay: 2.5 weeks     Due to the current state of emergency, patients may not be receiving their 3-hours of Medicare-mandated therapy.   Team Interventions: Nursing Interventions Patient/Family Education, Bladder Management, Bowel Management, Pain Management, Medication Management, Skin Care/Wound Management, Discharge Planning  PT interventions Ambulation/gait training, Neuromuscular re-education, Stair training, UE/LE Strength taining/ROM, Wheelchair propulsion/positioning, Discharge planning, Therapeutic Activities, UE/LE Coordination activities, Functional mobility  training, Patient/family education, Therapeutic Exercise, Community reintegration, DME/adaptive equipment instruction, Psychosocial support, Training and development officer, Pain management, Skin care/wound management, Cognitive remediation/compensation, Disease management/prevention, Splinting/orthotics, Visual/perceptual remediation/compensation  OT Interventions Training and development officer, Cognitive remediation/compensation, Community reintegration, Discharge planning, DME/adaptive equipment instruction, Functional mobility training, Pain management, Patient/family education, Psychosocial support, Self Care/advanced ADL retraining, Skin care/wound managment, Therapeutic Activities, Therapeutic Exercise, UE/LE Strength taining/ROM, UE/LE Coordination activities, Visual/perceptual remediation/compensation, Splinting/orthotics  SLP Interventions    TR Interventions    SW/CM Interventions     Barriers to Discharge MD  Medical stability and Wound care  Nursing Home environment access/layout, Decreased caregiver support, Incontinence, Wound Care, Weight bearing restrictions, Medication compliance Discharging to daughter's home. 1-level with 3-4 steps to enter. Can reach both rails. Daughters can provide 24/7 assist at discharge.  PT Inaccessible home environment    OT Decreased caregiver support, Inaccessible home environment 1 STE  SLP      SW       Team Discharge Planning: Destination: PT-Home ,OT- Home (vs SNF) , SLP-  Projected Follow-up: PT-Home health PT, 24 hour supervision/assistance, OT-  Home health OT, SLP-  Projected Equipment Needs: PT-To be determined, OT- To be determined, SLP-  Equipment Details: PT- , OT-  Patient/family involved in discharge planning: PT- Patient,  OT-Patient, SLP-   MD ELOS: 15-18d Medical Rehab Prognosis:  Fair Assessment: 87 year old female with history of HTN, SSS/PAF, NICM, CKD III, PMR-chronic steriods who was originally admitted to [facility] on 10/01/21 with  AKI, anemia, fall and subsequent right proximal humerus fracture and right pelvic fractures.  She was IV antibiotics for UTI and concerns of sepsis, IV fluids for hydration, Lokelma for hyperkalemia, Foley placement for urinary retention as well as 1 unit PRBC for anemia.  She was transferred to this CIR on 10/06/2021 for intensive rehab program to consist of PT and OT at least three hours per day.  She has had issues with nausea as well as constipation requiring adjustment of bowel program.  She did have worsening of renal status requiring IV fluids as well as recurrent hyperkalemia requiring Lokelma.  Foley was kept in place due to urinary retention and immobility issues.  Pain control is improving with scheduled narcotics.  She did develop rectal bleeding on early a.m. of 11/17 followed by drop in H&H to 6.2.  She was typed and crossed for 2 units PRBC and GI was consulted for input.     On 11/17, She was transferred to acute floor for work-up and closer monitoring.  Dr. Paulita Fujita recommended serial CBC and supportive care with hydration and monitoring as patient was not a candidate for colonoscopy due to multiple comorbidities.  He also recommended tagged RBC study was a CTA if patient had rebleeding.  CT abdomen pelvis was negative for acute changes.  Foley catheter was removed and renal status was monitored.  As H&H was stable and no recurrent rectal bleeding noted, GI signed off.  Palliative care consulted to discuss goals of care and patient elected on full scope of care.  Zofran was scheduled twice daily to help with nausea in addition to hydrocodone 3 times daily for pain management.  She has had recurrent hyperkalemia requiring additional dose of Lokelma.  Therapy was resumed and patient continues to be limited by weakness, RU E weight bearing restrictions, pain, fatigue and balance deficits affecting mobility as well as ADLs.  CIR recommended due to functional decline      See Team Conference Notes  for weekly updates to the plan of care

## 2021-10-20 NOTE — Progress Notes (Signed)
Physical Therapy Session Note  Patient Details  Name: [name] MRN: [id number] Date of Birth: [birth year]  Today's Date: 10/20/2021 PT Individual Time: 1101-1117 PT Individual Time Calculation (min): 16 min  and Today's Date: 10/20/2021 PT Missed Time: 44 Minutes Missed Time Reason: Patient fatigue;Pain  Short Term Goals: Week 1:     Skilled Therapeutic Interventions/Progress Updates:    Patient received seated in wc, reporting 7/10 pain and fatigue from prior therapy session. RN alerted to patients pain level and provided pain rx. PT offering ice to assist with pain management- patient declined. PT reviewing non-pharmacological pain interventions to assist in managing pain as therapy begins. Patient receptive to education. Politely declined to participate in therapy due to pain and fatigue. Remaining up in chair, seatbelt alarm on, call light within reach.   Therapy Documentation Precautions:  Precautions Precautions: Fall Precaution Comments: High fall risk Required Braces or Orthoses: Sling Restrictions Weight Bearing Restrictions: Yes RUE Weight Bearing: Non weight bearing RLE Weight Bearing: Weight bearing as tolerated LLE Weight Bearing: Weight bearing as tolerated Other Position/Activity Restrictions: Sling at all times except for during ADLs; keep elbow close to body when not in sling. No PROM/AROM at R shoulder for 2-3 weeks    Therapy/Group: Individual Therapy  Karoline Caldwell, PT, DPT, CBIS  10/20/2021, 7:29 AM

## 2021-10-20 NOTE — Progress Notes (Addendum)
PROGRESS NOTE   Subjective/Complaints: Pain well controlled, has foley , discussed voiding trial next week  ROS- no CP, SOB, N/V/D Objective:   No results found. Recent Labs    10/18/21 0921 10/19/21 0504  WBC 9.4 14.5*  HGB 8.5* 9.2*  HCT 25.7* 29.0*  PLT 341 367    Recent Labs    10/19/21 0504  NA 136  K 5.3*  CL 107  CO2 22  GLUCOSE 93  BUN 29*  CREATININE 0.97  CALCIUM 8.5*     Intake/Output Summary (Last 24 hours) at 10/20/2021 0636 Last data filed at 10/19/2021 1843 Gross per 24 hour  Intake 300 ml  Output 475 ml  Net -175 ml         Physical Exam: Vital Signs Blood pressure (!) 134/46, pulse 82, temperature 97.6 F (36.4 C), temperature source Oral, resp. rate 16, height 4\' 8"  (1.422 m), weight 49.8 kg, SpO2 100 %.   General: No acute distress Mood and affect are appropriate Heart: Regular rate and rhythm no rubs murmurs or extra sounds Lungs: Clear to auscultation, breathing unlabored, no rales or wheezes Abdomen: Positive bowel sounds, soft nontender to palpation, nondistended  Extremities: No LE edema Skin: No evidence of breakdown, no evidence of rash, mult facial ecchymoses Neurologic: Cranial nerves II through XII intact, motor strength is 5/5 in left deltoid, bicep, tricep, grip, 4/5 Right and 5/5 Left  Hip flexor, knee extensors, ankle dorsiflexor and plantar flexor Sensory exam normal sensation to light touch  in bilateral upper and lower extremities   Musculoskeletal: RUE in sling has good grip , color and temp Unchanged exam  Assessment/Plan: 1. Functional deficits which require 3+ hours per day of interdisciplinary therapy in a comprehensive inpatient rehab setting. Physiatrist is providing close team supervision and 24 hour management of active medical problems listed below. Physiatrist and rehab team continue to assess barriers to discharge/monitor patient progress toward  functional and medical goals  Care Tool:  Bathing              Bathing assist       Upper Body Dressing/Undressing Upper body dressing        Upper body assist      Lower Body Dressing/Undressing Lower body dressing      What is the patient wearing?: Incontinence brief     Lower body assist Assist for lower body dressing: Total Assistance - Patient < 25%     Toileting Toileting    Toileting assist Assist for toileting: Total Assistance - Patient < 25%     Transfers Chair/bed transfer  Transfers assist           Locomotion Ambulation   Ambulation assist              Walk 10 feet activity   Assist           Walk 50 feet activity   Assist           Walk 150 feet activity   Assist           Walk 10 feet on uneven surface  activity   Assist  Wheelchair     Assist               Wheelchair 50 feet with 2 turns activity    Assist            Wheelchair 150 feet activity     Assist          Blood pressure (!) 134/46, pulse 82, temperature 97.6 F (36.4 C), temperature source Oral, resp. rate 16, height 4\' 8"  (1.422 m), weight 49.8 kg, SpO2 100 %.  Medical Problem List and Plan: 1. Functional deficits secondary to poly trauma, pelvic and humeral fracture             -patient may Not shower             -ELOS/Goals: 10-12d sup to min A 2.  Antithrombotics: -DVT/anticoagulation:  Mechanical: Sequential compression devices, below knee Bilateral lower extremities.              -antiplatelet therapy: N/A 3. Pain Management: Hydrocodone 3 times daily with as needed oxycodone for breakthrough pain 4. Mood: LCSW to follow for evaluation and support             -antipsychotic agents: N/AA 5. Neuropsych: This patient is capable of making decisions on her own behalf. 6. Skin/Wound Care: Routine pressure-relief measures. 7. Fluids/Electrolytes/Nutrition: Monitor I/O. Check CMET in am. 8.  PAF: Off eliquis. Monitor Heart rate TID on coreg BID. 9. HTN: Monitor BP TID-on Norvasc, Coreg and lisinopril Vitals:   10/20/21 0434 10/20/21 0456  BP: (!) 130/47 (!) 134/46  Pulse: 85 82  Resp: 18 16  Temp: 97.6 F (36.4 C) 97.6 F (36.4 C)  SpO2: 100% 100%  Controlled 11/25 10. Recurrent hyperkalemia: Likely due to ACE--Discontinue Lisinopril?? 11. PMR: Followed by Dr.Beekman and has been managed with medrol 4 mg daily.  12. Right humerus fracture: NWB RUE with sling at all times.  --May doff sling for ADLs with right forearm close to body. 13. Urinary retention: Continues to require I/O caths--last for 400- 800 cc. --monitor voiding with PVR/bladder scan and cath to keep volumes < 400 cc.  14. Constipation: Last BM documented on 11/21 as smal and red. Only smears since then --Will start on laxatives to prevent recurrent constipation/bleeding.   15. Nausea: Continue zofran BID. Marland Kitchen   16.  Leukocytosis , afeb on medrol for PMR  LOS: 2 days A FACE TO FACE EVALUATION WAS PERFORMED  Charlett Blake 10/20/2021, 6:36 AM

## 2021-10-20 NOTE — Progress Notes (Signed)
Patient ID: [name], female   DOB: June 03, 1932, 87 y.o.   MRN: [id number]  SW familiar with pt as she is a return patient. Pt was admitted to CIR on 11/18. Pt was transferred to acute on 11/17 due to drop in hemoglobin. Pt readmitted to CIR  on 11/23. Please see assessment completed on 11/15. SW will continue to follow patient for care needs and ensure no barriers to discharge.   Loralee Pacas, MSW, [place] Office: [phone] Cell: [phone] Fax: [phone]

## 2021-10-20 NOTE — Progress Notes (Signed)
Inpatient Rehabilitation  Patient information reviewed and entered into eRehab system by Kiah Vanalstine Florencia Zaccaro, OTR/L.   Information including medical coding, functional ability and quality indicators will be reviewed and updated through discharge.    

## 2021-10-21 LAB — BASIC METABOLIC PANEL
Anion gap: 7 (ref 5–15)
BUN: 40 mg/dL — ABNORMAL HIGH (ref 8–23)
CO2: 20 mmol/L — ABNORMAL LOW (ref 22–32)
Calcium: 7.5 mg/dL — ABNORMAL LOW (ref 8.9–10.3)
Chloride: 103 mmol/L (ref 98–111)
Creatinine, Ser: 1.34 mg/dL — ABNORMAL HIGH (ref 0.44–1.00)
GFR, Estimated: 38 mL/min — ABNORMAL LOW (ref >60)
Glucose, Bld: 78 mg/dL (ref 70–99)
Potassium: 6.2 mmol/L — ABNORMAL HIGH (ref 3.5–5.1)
Sodium: 130 mmol/L — ABNORMAL LOW (ref 135–145)

## 2021-10-21 MED ORDER — SODIUM POLYSTYRENE SULFONATE 15 GM/60ML PO SUSP
30.0000 g | Freq: Once | ORAL | Status: AC
  Administered 2021-10-21: 30 g via ORAL
  Filled 2021-10-21: qty 120

## 2021-10-21 MED ORDER — LOPERAMIDE HCL 2 MG PO CAPS
2.0000 mg | ORAL_CAPSULE | ORAL | Status: DC | PRN
  Administered 2021-10-22: 01:00:00 2 mg via ORAL
  Filled 2021-10-21: qty 1

## 2021-10-21 NOTE — Progress Notes (Signed)
Physical Therapy Session Note  Patient Details  Name: [name] MRN: [id number] Date of Birth: 09-23-1932  Today's Date: 10/21/2021 PT Individual Time: 0800-0914 PT Individual Time Calculation (min): 74 min   Short Term Goals: Week 1:  PT Short Term Goal 1 (Week 1): Pt will transfer sup to sit w/ min A. PT Short Term Goal 2 (Week 1): Pt will transfer sit to stand w/ min A PT Short Term Goal 3 (Week 1): Pt will amb w/ least restrictive device x 15' w/ min A.   Skilled Therapeutic Interventions/Progress Updates:   Pt received supine in bed and agreeable to PT. Pt reports abdominal cramping 5/10; cues for pursed lip breathing to reduce pain. Pt noted to have been incontinent of bowel. Rolling to the L for PT to perform pericare and clothing management.  Supine>sit transfer with mod assist at trunk and cues for use of bed rail through the LUE. Sit<>stand from EOB x 3 with min assist overall for PT to assist with lower body dressing. Additional sit<>stand from [place] x 3 with min assist and cues for push from [facility] arm rest.   Stand pivot to WC with LUE supported on bed rail with min assist. Pt transported to rehab gym in [facility]. Gait training with QC 2 x 74ft with min-mod assist with cues for posture and improved 3 point gait pattern with mild improvement with distance  Seated BLE therex: LAQ x 10.  Ankle PF x 20, hip abduction/adduction x 12, hip flexion x 10. Hip extension with level 2 tband x 10 cues for full ROM, decreased speed of eccentric movement and increased hold at end range to improve strengthening aspect of movements.   Patient returned to room and left sitting in [facility] with call bell in reach and all needs met.          Therapy Documentation Precautions:  Precautions Precautions: Fall Precaution Comments: High fall risk Required Braces or Orthoses: Sling Restrictions Weight Bearing Restrictions: Yes RUE Weight Bearing: Non weight bearing RLE Weight Bearing: Weight bearing as  tolerated LLE Weight Bearing: Weight bearing as tolerated Other Position/Activity Restrictions: Sling at all times except for during ADLs; keep elbow close to body when not in sling. No PROM/AROM at R shoulder for 2-3 weeks   Therapy/Group: Individual Therapy  Lorie Phenix 10/21/2021, 9:16 AM

## 2021-10-21 NOTE — Progress Notes (Signed)
Occupational Therapy Session Note  Patient Details  Name: [name] MRN: [id number] Date of Birth: 08-12-1932  Today's Date: 10/21/2021 OT Individual Time: 8110-3159 OT Individual Time Calculation (min): 53 min    Short Term Goals: Week 1:  OT Short Term Goal 1 (Week 1): Patient will complete 1 step of UB dressing task OT Short Term Goal 2 (Week 1): Patient will complete 1 step of LB dressing task using LRAD OT Short Term Goal 3 (Week 1): Patient will tolerate standing for 2 minutes in preparation for BADL Task  Skilled Therapeutic Interventions/Progress Updates:    Pt received seated in recliner, c/o ongoing RUE/R hip pain but did not rate and, agreeable to therapy. Session focus on self-care retraining, activity tolerance, transfer retraining, static/dynamic standing balance in prep for improved ADL/IADL/func mobility performance + decreased caregiver burden. Declines need for ADL. Total A transport to and from gym. Stand-pivot to and from w/c with quad cane and mod A for steadying and managing foley. Stood to play one round of corn hole with CGA for balance; able to retrieve bags from various heights and cross body with LUE. Noted to be incontinent of bowel with poor awareness. Stand-pivot to toilet with mod A and LUE support on grab bar. Total A for toileting tasks, no further void. Multiple attempts to stand from low toilet with LUE on grab bar, pt losing balance x1 requiring max A to safely sit on toilet. Stedy transfer > bed with max A to power up. Returned to supine with max A to adjust trunk and BLE.    Pt left semi-reclined in bed with bed alarm engaged, call bell in reach, and all immediate needs met.    Therapy Documentation Precautions:  Precautions Precautions: Fall Precaution Comments: High fall risk Required Braces or Orthoses: Sling Restrictions Weight Bearing Restrictions: Yes RUE Weight Bearing: Non weight bearing RLE Weight Bearing: Weight bearing as  tolerated LLE Weight Bearing: Weight bearing as tolerated Other Position/Activity Restrictions: Sling at all times except for during ADLs; keep elbow close to body when not in sling. No PROM/AROM at R shoulder for 2-3 weeks  Pain: see session notes ADL: See Care Tool for more details.  Therapy/Group: Individual Therapy  Volanda Napoleon MS, OTR/L  10/21/2021, 6:57 AM

## 2021-10-21 NOTE — Progress Notes (Signed)
Physical Therapy Session Note  Patient Details  Name: [name] MRN: [id number] Date of Birth: 12/25/1931  Today's Date: 10/21/2021 PT Individual Time: 1455-1535 PT Individual Time Calculation (min): 40 min   Short Term Goals: Week 1:  PT Short Term Goal 1 (Week 1): Pt will transfer sup to sit w/ min A. PT Short Term Goal 2 (Week 1): Pt will transfer sit to stand w/ min A PT Short Term Goal 3 (Week 1): Pt will amb w/ least restrictive device x 15' w/ min A.  Skilled Therapeutic Interventions/Progress Updates:     Patient in bed upon PT arrival. Patient alert and agreeable to PT session. Patient reported 4/10 pelvic pain during session, LPN made aware. PT provided repositioning, rest breaks, and distraction as pain interventions throughout session.   Patient with several phone calls during session. Patient unable to hear caller, PT also unable to hear caller. Family called the front desk to let them know that they were calling and able to hear the patient and PT. Phone changed by nursing during session without success, nursing calling for assistance to fix the patient's phone.   Patient very concerned about a "near fall" with therapy earlier where her legs buckled. She reports feeling impatient with her recovery and progress. Provided therapeutic listening and educated on grief related to loss of independence and function and progression to improve her current level of function. Patient appreciative and attentive throughout.   Patient performed supine to/from sit with min A for scooting forward and lifting her legs onto the bed. Provided cues for progression through side-lying to improve independence with sitting up. She performed stand pivot bed<>w/c with min A with L HHA with cues for forward weight shift and hip/trunk extension in standing. She performed sit to/from stand from the w/c x1 in preparation for gait training when patient reported sudden onset of nausea and dizziness and  returned to sitting with min A.  Vitals: BP 135/103, MAP 114 Discussed with LPN who requested the patient to return to bed for multiple medications and to reduce BP. Patient reported improved symptoms in sitting and resolved symptoms in lying.   Patient in bed handed off to LPN at end of session with breaks locked, bed alarm set, and all needs within reach.   Therapy Documentation Precautions:  Precautions Precautions: Fall Precaution Comments: High fall risk Required Braces or Orthoses: Sling Restrictions Weight Bearing Restrictions: Yes RUE Weight Bearing: Non weight bearing RLE Weight Bearing: Weight bearing as tolerated LLE Weight Bearing: Weight bearing as tolerated Other Position/Activity Restrictions: Sling at all times except for during ADLs; keep elbow close to body when not in sling. No PROM/AROM at R shoulder for 2-3 weeks    Therapy/Group: Individual Therapy  Mannie Wineland L [employer] PT, DPT  10/21/2021, 4:08 PM

## 2021-10-21 NOTE — Progress Notes (Signed)
PROGRESS NOTE   Subjective/Complaints: She felt well with PT today- walked and used stairs! Discussed that potassium level has further increased to 6.2 today and will be giving Kayexalate after therapy which can cause diarrhea  ROS- no CP, SOB, N/V/D, +abdominal cramping Objective:   No results found. Recent Labs    10/19/21 0504  WBC 14.5*  HGB 9.2*  HCT 29.0*  PLT 367   Recent Labs    10/19/21 0504 10/21/21 0510  NA 136 130*  K 5.3* 6.2*  CL 107 103  CO2 22 20*  GLUCOSE 93 78  BUN 29* 40*  CREATININE 0.97 1.34*  CALCIUM 8.5* 7.5*    Intake/Output Summary (Last 24 hours) at 10/21/2021 1045 Last data filed at 10/21/2021 0830 Gross per 24 hour  Intake 540 ml  Output 800 ml  Net -260 ml        Physical Exam: Vital Signs Blood pressure 118/66, pulse 84, temperature 98.7 F (37.1 C), temperature source Oral, resp. rate 14, height 4\' 8"  (1.422 m), weight 49.8 kg, SpO2 96 %. Gen: no distress, normal appearing HEENT: oral mucosa pink and moist, NCAT Cardio: Reg rate Chest: normal effort, normal rate of breathing Abd: soft, non-distended Extremities: No LE edema Skin: No evidence of breakdown, no evidence of rash, mult facial ecchymoses Neurologic: Cranial nerves II through XII intact, motor strength is 5/5 in left deltoid, bicep, tricep, grip, 4/5 Right and 5/5 Left  Hip flexor, knee extensors, ankle dorsiflexor and plantar flexor Sensory exam normal sensation to light touch  in bilateral upper and lower extremities   Musculoskeletal: RUE in sling has good grip , color and temp Unchanged exam  Assessment/Plan: 1. Functional deficits which require 3+ hours per day of interdisciplinary therapy in a comprehensive inpatient rehab setting. Physiatrist is providing close team supervision and 24 hour management of active medical problems listed below. Physiatrist and rehab team continue to assess barriers to  discharge/monitor patient progress toward functional and medical goals  Care Tool:  Bathing    Body parts bathed by patient: Chest, Abdomen, Face   Body parts bathed by helper: Right arm, Left arm, Front perineal area, Right upper leg, Buttocks, Left upper leg, Right lower leg, Left lower leg     Bathing assist Assist Level: Maximal Assistance - Patient 24 - 49%     Upper Body Dressing/Undressing Upper body dressing   What is the patient wearing?: Button up shirt    Upper body assist Assist Level: Total Assistance - Patient < 25%    Lower Body Dressing/Undressing Lower body dressing      What is the patient wearing?: Incontinence brief, Pants     Lower body assist Assist for lower body dressing: Total Assistance - Patient < 25%     Toileting Toileting    Toileting assist Assist for toileting: Total Assistance - Patient < 25%     Transfers Chair/bed transfer  Transfers assist     Chair/bed transfer assist level: 2 Helpers     Locomotion Ambulation   Ambulation assist      Assist level: Moderate Assistance - Patient 50 - 74% Assistive device: Hand held assist Max distance: 82'  Walk 10 feet activity   Assist     Assist level: Moderate Assistance - Patient - 50 - 74% Assistive device: Hand held assist   Walk 50 feet activity   Assist Walk 50 feet with 2 turns activity did not occur: Safety/medical concerns  Assist level: Moderate Assistance - Patient - 50 - 74%      Walk 150 feet activity   Assist Walk 150 feet activity did not occur: Safety/medical concerns         Walk 10 feet on uneven surface  activity   Assist Walk 10 feet on uneven surfaces activity did not occur: Safety/medical concerns         Wheelchair     Assist Is the patient using a wheelchair?: Yes Type of Wheelchair: Manual    Wheelchair assist level: Dependent - Patient 0% Max wheelchair distance: 150'    Wheelchair 50 feet with 2 turns  activity    Assist        Assist Level: Dependent - Patient 0%   Wheelchair 150 feet activity     Assist      Assist Level: Dependent - Patient 0%   Blood pressure 118/66, pulse 84, temperature 98.7 F (37.1 C), temperature source Oral, resp. rate 14, height 4\' 8"  (1.422 m), weight 49.8 kg, SpO2 96 %.  Medical Problem List and Plan: 1. Functional deficits secondary to poly trauma, pelvic and humeral fracture             -patient may Not shower             -ELOS/Goals: 10-12d sup to min A  -Continue CIR 2.  Antithrombotics: -DVT/anticoagulation:  Mechanical: Sequential compression devices, below knee Bilateral lower extremities.              -antiplatelet therapy: N/A 3. Pain Management: Hydrocodone 3 times daily with as needed oxycodone for breakthrough pain 4. Mood: LCSW to follow for evaluation and support             -antipsychotic agents: N/AA 5. Neuropsych: This patient is capable of making decisions on her own behalf. 6. Skin/Wound Care: Routine pressure-relief measures. 7. Fluids/Electrolytes/Nutrition: Monitor I/O. Check CMET in am. 8. PAF: Off eliquis. Monitor Heart rate TID on coreg BID. 9. HTN: Monitor BP TID-on Norvasc, Coreg and lisinopril Vitals:   10/20/21 1955 10/21/21 0403  BP: (!) 114/52 118/66  Pulse: 74 84  Resp: 17 14  Temp: 98.3 F (36.8 C) 98.7 F (37.1 C)  SpO2: 98% 96%  Soft, high potassium likely contirbuting, 36mL Kayexalate ordered 11/26 10. Hyperkalemia to 6.2. Appears to be asymptomatic. Kayexalate 30 today after therapy, repeat tomorrow, Lisinopril d/ced. consulted pharm to review medications for any that may contribute to hyperkalemia.  11. PMR: Followed by Percival Spanish and has been managed with medrol 4 mg daily.  12. Right humerus fracture: NWB RUE with sling at all times.  --May doff sling for ADLs with right forearm close to body. 13. Urinary retention: Continues to require I/O caths--last for 400- 800 cc. --monitor voiding  with PVR/bladder scan and cath to keep volumes < 400 cc.  14. Constipation: Last BM documented on 11/21 as smal and red. Only smears since then --Will start on laxatives to prevent recurrent constipation/bleeding.   15. Nausea: Continue zofran BID. Marland Kitchen   16.  Leukocytosis , afeb on medrol for PMR 17. AKI: Cr up to 1.34- placed nursing order to encourage 6-8 glasses of water per day, repeat Creatinine tomorrow  LOS:  3 days A FACE TO FACE EVALUATION WAS PERFORMED  Martha Clan P Avier Jech 10/21/2021, 10:45 AM

## 2021-10-22 LAB — BASIC METABOLIC PANEL
Anion gap: 9 (ref 5–15)
BUN: 31 mg/dL — ABNORMAL HIGH (ref 8–23)
CO2: 18 mmol/L — ABNORMAL LOW (ref 22–32)
Calcium: 7.8 mg/dL — ABNORMAL LOW (ref 8.9–10.3)
Chloride: 105 mmol/L (ref 98–111)
Creatinine, Ser: 1.07 mg/dL — ABNORMAL HIGH (ref 0.44–1.00)
GFR, Estimated: 50 mL/min — ABNORMAL LOW (ref >60)
Glucose, Bld: 87 mg/dL (ref 70–99)
Potassium: 4.9 mmol/L (ref 3.5–5.1)
Sodium: 132 mmol/L — ABNORMAL LOW (ref 135–145)

## 2021-10-22 NOTE — Progress Notes (Signed)
Occupational Therapy Session Note  Patient Details  Name: [name] MRN: [id number] Date of Birth: 1932/08/21  Today's Date: 10/22/2021 OT Individual Time: 1135-1200 OT Individual Time Calculation (min): 25 min    Short Term Goals: Week 1:  OT Short Term Goal 1 (Week 1): Patient will complete 1 step of UB dressing task OT Short Term Goal 2 (Week 1): Patient will complete 1 step of LB dressing task using LRAD OT Short Term Goal 3 (Week 1): Patient will tolerate standing for 2 minutes in preparation for BADL Task  Skilled Therapeutic Interventions/Progress Updates:    Pt semi reclined in bed, reports 8/10 pain over "the whole right side of my body".  Nurse made aware and pain medication administered during session.  Pt agreeable to brushing teeth, requesting that she complete it in supported sitting at bed level due to pain.  Pt brushed teeth with min assist including opening toothpaste/applying to toothbrush due to RUE positioned in sling.  Pt also needing assist to hold tray while rinsing mouth out.    Instructed pt through AROM right wrist and digits to prevent stiffness and contracture development.  Included 2x10 reps of composite flex/extension of digits, thumb opposition to each fingertip, and wrist flex/ext.   Call bell in reach, bed alarm on at end of session.    Therapy Documentation Precautions:  Precautions Precautions: Fall Precaution Comments: High fall risk Required Braces or Orthoses: Sling Restrictions Weight Bearing Restrictions: Yes RUE Weight Bearing: Non weight bearing RLE Weight Bearing: Weight bearing as tolerated LLE Weight Bearing: Weight bearing as tolerated Other Position/Activity Restrictions: Sling at all times except for during ADLs; keep elbow close to body when not in sling. No PROM/AROM at R shoulder for 2-3 weeks    Therapy/Group: Individual Therapy  Ezekiel Slocumb 10/22/2021, 1:35 PM

## 2021-10-22 NOTE — Progress Notes (Signed)
Physical Therapy Session Note  Patient Details  Name: [name] MRN: [id number] Date of Birth: [birth year]  Today's Date: 10/22/2021 PT Individual Time: 0910-1005 and 1310-1348 PT Individual Time Calculation (min): 55 min and 38 min And Today's Date: 10/22/2021 PT Missed Time: 22 Minutes Missed Time Reason: Patient fatigue  Short Term Goals: Week 1:  PT Short Term Goal 1 (Week 1): Pt will transfer sup to sit w/ min A. PT Short Term Goal 2 (Week 1): Pt will transfer sit to stand w/ min A PT Short Term Goal 3 (Week 1): Pt will amb w/ least restrictive device x 15' w/ min A.  Skilled Therapeutic Interventions/Progress Updates:    Session 1: Pt received supine in bed reporting she is "tired" and "feeling weak" due to having diahrea all day yesterday and through the night, despite this pt agreeable to this therapy session. Pt already wearing R UE sling. While therapist setting up room pt reports she has suddenly been incontinent of bowels - pt reports she can't tell when it is coming but can feel once it has happened. Therapist encouraged pt to transfer to [facility] to attempt further continent void; however, pt reports she doesn't feel she has any more to void at this time. Rolling L in bed relying heavily on bedrail with min assist to rotate pelvis during total assist LB clothing management and peri-care. Supine>sitting L EOB, HOB partially elevated and relying heavily on bedrail for trunk upright, with min assist for trunk control and scooting hips towards EOB. Sitting EOB using L UE support on bedrail for trunk control while threading LEs in pants and donning shoes max assist. Sit>stand from EOB using L UE support on bedrail and on large based quad cane with min assist for lifting and balance. Pt requesting to hold therapist hand with her R hand for comfort any time going to stand. Standing with CGA pulled pants up over hips total assist. R stand pivot EOB>w/c using large base quad cane min assist -  therapist managing foley line. Gait training ~58ft 2x using L UE support on large based quad cane with min assist for balance (pt continues to hold therapist's hand with her R hand for comfort) demos excessive anterior trunk flexion, decreased gait speed, wide BOS, decreased B LE step lengths, and instability throughout. Pt requesting to return to bed and rest. Sit>supine with CGA for safety. Supine scoot towards [facility] with therapist keeping feet in hooklying position. Pt left supine in bed with needs in reach, HOB elevated, R UE in sling, and bed alarm on.  Of note: Pt demos impaired short term memory throughout session frequently repeating same questions such as asking what time is her next therapy session.  Session 2: Pt received supine in bed and agreeable to therapy session. Pt already wearing R UE sling. Pt agreeable to attempt toileting and is unaware that she has been incontinent of bowels. Supine>sitting L EOB, HOB max elevated and relying heavily on bedrail, with CGA for safety - cuing for head/hips relationship to improve pt's ability to independently scoot hips towards EOB. Sitting EOB donned tennis shoes total assist. Sit>stand EOB>L UE support on bedrail vs large based quad cane with mod assist this afternoon with pt having increased posterior lean. R stand pivot to [facility] using L UE support on bedrail vs large based quad cane with mod assist this afternoon due to continued posterior lean, cuing for improvement. Standing with min assist for balance during total assist LB clothing management. Pt  able to have very small continent BM on BSC. While standing during total assist peri-care pt reports feeling nauseas therefore quickly performed L stand pivot back to EOB using LUE support on quad cane/bedrail with mod assist for balance - pt reports she thinks it is actually indigestion. Provided seated rest break on EOB prior to standing back up to finish donning LB clothing when pt reports feeling "very weak"  like her "legs are going to go out" therefore returned to sitting EOB. Pt reports she is too exhausted to continue therapy session and politely requests to rest at this time. Sit>supine, HOB elevated and using bedrail with supervision. Pt left supine in bed with needs in reach, R UE in sling, lines intact, and bed alarm on. Missed 27 minutes of skilled physical therapy.  Therapy Documentation Precautions:  Precautions Precautions: Fall Precaution Comments: High fall risk Required Braces or Orthoses: Sling Restrictions Weight Bearing Restrictions: Yes RUE Weight Bearing: Non weight bearing RLE Weight Bearing: Weight bearing as tolerated LLE Weight Bearing: Weight bearing as tolerated Other Position/Activity Restrictions: Sling at all times except for during ADLs; keep elbow close to body when not in sling. No PROM/AROM at R shoulder for 2-3 weeks   Pain:  Session 1: No complaints of pain throughout session; however, at the end pt requesting medication therefore nursing staff notified.  Session 2: No reports of pain, just nausea.   Therapy/Group: Individual Therapy  Tawana Scale , PT, DPT, NCS, CSRS  10/22/2021, 7:49 AM

## 2021-10-22 NOTE — Progress Notes (Signed)
Physical Therapy Session Note  Patient Details  Name: [name] MRN: [id number] Date of Birth: [birth year]  Today's Date: 10/22/2021 PT Amount of Missed Time (min): 30 Minutes PT Missed Treatment Reason: Patient fatigue  Short Term Goals: Week 1:  PT Short Term Goal 1 (Week 1): Pt will transfer sup to sit w/ min A. PT Short Term Goal 2 (Week 1): Pt will transfer sit to stand w/ min A PT Short Term Goal 3 (Week 1): Pt will amb w/ least restrictive device x 15' w/ min A.  Skilled Therapeutic Interventions/Progress Updates:    Attempted to see patient for scheduled therapy session. Pt reports fatigue from frequent diarrhea this date and declines participation in therapy session this PM. Pt missed 30 min of scheduled therapy session due to fatigue.  Therapy Documentation Precautions:  Precautions Precautions: Fall Precaution Comments: High fall risk Required Braces or Orthoses: Sling Restrictions Weight Bearing Restrictions: Yes RUE Weight Bearing: Non weight bearing RLE Weight Bearing: Weight bearing as tolerated LLE Weight Bearing: Weight bearing as tolerated Other Position/Activity Restrictions: Sling at all times except for during ADLs; keep elbow close to body when not in sling. No PROM/AROM at R shoulder for 2-3 weeks    Therapy/Group: Individual Therapy   Excell Seltzer, PT, DPT, CSRS  10/22/2021, 4:54 PM

## 2021-10-23 DIAGNOSIS — R7989 Other specified abnormal findings of blood chemistry: Secondary | ICD-10-CM

## 2021-10-23 DIAGNOSIS — I10 Essential (primary) hypertension: Secondary | ICD-10-CM

## 2021-10-23 DIAGNOSIS — K922 Gastrointestinal hemorrhage, unspecified: Secondary | ICD-10-CM

## 2021-10-23 LAB — CBC
HCT: 27.6 % — ABNORMAL LOW (ref 36.0–46.0)
Hemoglobin: 8.9 g/dL — ABNORMAL LOW (ref 12.0–15.0)
MCH: 31.3 pg (ref 26.0–34.0)
MCHC: 32.2 g/dL (ref 30.0–36.0)
MCV: 97.2 fL (ref 80.0–100.0)
Platelets: 273 10*3/uL (ref 150–400)
RBC: 2.84 MIL/uL — ABNORMAL LOW (ref 3.87–5.11)
RDW: 14.7 % (ref 11.5–15.5)
WBC: 9.2 10*3/uL (ref 4.0–10.5)
nRBC: 0 % (ref 0.0–0.2)

## 2021-10-23 LAB — BASIC METABOLIC PANEL
Anion gap: 5 (ref 5–15)
BUN: 26 mg/dL — ABNORMAL HIGH (ref 8–23)
CO2: 22 mmol/L (ref 22–32)
Calcium: 7.7 mg/dL — ABNORMAL LOW (ref 8.9–10.3)
Chloride: 106 mmol/L (ref 98–111)
Creatinine, Ser: 0.94 mg/dL (ref 0.44–1.00)
GFR, Estimated: 59 mL/min — ABNORMAL LOW (ref >60)
Glucose, Bld: 81 mg/dL (ref 70–99)
Potassium: 4.5 mmol/L (ref 3.5–5.1)
Sodium: 133 mmol/L — ABNORMAL LOW (ref 135–145)

## 2021-10-23 MED ORDER — SENNOSIDES-DOCUSATE SODIUM 8.6-50 MG PO TABS
1.0000 | ORAL_TABLET | Freq: Every day | ORAL | Status: DC
Start: 2021-10-23 — End: 2021-10-31
  Administered 2021-10-23 – 2021-10-30 (×8): 1 via ORAL
  Filled 2021-10-23 (×8): qty 1

## 2021-10-23 MED ORDER — CHLORHEXIDINE GLUCONATE CLOTH 2 % EX PADS
6.0000 | MEDICATED_PAD | Freq: Two times a day (BID) | CUTANEOUS | Status: DC
  Administered 2021-10-23 – 2021-10-24 (×2): 6 via TOPICAL

## 2021-10-23 NOTE — Progress Notes (Addendum)
PROGRESS NOTE   Subjective/Complaints: Pt up at EOB. Having loose stool from laxative and softeners which has worn her out a bit.   ROS: Patient denies fever, rash, sore throat, blurred vision, nausea, vomiting, diarrhea, cough, shortness of breath or chest pain,   headache, or mood change.    Objective:   No results found. Recent Labs    10/23/21 0531  WBC 9.2  HGB 8.9*  HCT 27.6*  PLT 273   Recent Labs    10/22/21 0613 10/23/21 0531  NA 132* 133*  K 4.9 4.5  CL 105 106  CO2 18* 22  GLUCOSE 87 81  BUN 31* 26*  CREATININE 1.07* 0.94  CALCIUM 7.8* 7.7*    Intake/Output Summary (Last 24 hours) at 10/23/2021 0915 Last data filed at 10/23/2021 0751 Gross per 24 hour  Intake 720 ml  Output 400 ml  Net 320 ml        Physical Exam: Vital Signs Blood pressure (!) 163/94, pulse 78, temperature 98.7 F (37.1 C), temperature source Oral, resp. rate 18, height 4\' 8"  (1.422 m), weight 49.9 kg, SpO2 97 %. Constitutional: No distress . Vital signs reviewed. HEENT: NCAT, EOMI, oral membranes moist Neck: supple Cardiovascular: RRR without murmur. No JVD    Respiratory/Chest: CTA Bilaterally without wheezes or rales. Normal effort    GI/Abdomen: BS +, non-tender, non-distended Ext: no clubbing, cyanosis,  Psych: pleasant and cooperative  Skin: No evidence of breakdown, no evidence of rash, mult facial ecchymoses are improving Neurologic: Cranial nerves II through XII intact, motor strength is 5/5 in left deltoid, bicep, tricep, grip, 4/5 Right and 5/5 Left  Hip flexor, knee extensors, ankle dorsiflexor and plantar flexor Sensory exam normal sensation to light touch  in bilateral upper and lower extremities Musculoskeletal: RUE in sling has good grip , color and temp. RUE 1+ edema   Assessment/Plan: 1. Functional deficits which require 3+ hours per day of interdisciplinary therapy in a comprehensive inpatient rehab  setting. Physiatrist is providing close team supervision and 24 hour management of active medical problems listed below. Physiatrist and rehab team continue to assess barriers to discharge/monitor patient progress toward functional and medical goals  Care Tool:  Bathing    Body parts bathed by patient: Chest, Abdomen, Face   Body parts bathed by helper: Right arm, Left arm, Front perineal area, Right upper leg, Buttocks, Left upper leg, Right lower leg, Left lower leg     Bathing assist Assist Level: Maximal Assistance - Patient 24 - 49%     Upper Body Dressing/Undressing Upper body dressing   What is the patient wearing?: Button up shirt    Upper body assist Assist Level: Total Assistance - Patient < 25%    Lower Body Dressing/Undressing Lower body dressing      What is the patient wearing?: Incontinence brief, Pants     Lower body assist Assist for lower body dressing: Total Assistance - Patient < 25%     Toileting Toileting    Toileting assist Assist for toileting: Total Assistance - Patient < 25%     Transfers Chair/bed transfer  Transfers assist     Chair/bed transfer assist level: Minimal Assistance -  Patient > 75% Chair/bed transfer assistive device: Armrests, Cane (quad cane)   Locomotion Ambulation   Ambulation assist      Assist level: Minimal Assistance - Patient > 75% Assistive device: Cane-quad Max distance: 17ft   Walk 10 feet activity   Assist     Assist level: Moderate Assistance - Patient - 50 - 74% Assistive device: Hand held assist   Walk 50 feet activity   Assist Walk 50 feet with 2 turns activity did not occur: Safety/medical concerns         Walk 150 feet activity   Assist Walk 150 feet activity did not occur: Safety/medical concerns         Walk 10 feet on uneven surface  activity   Assist Walk 10 feet on uneven surfaces activity did not occur: Safety/medical concerns          Wheelchair     Assist Is the patient using a wheelchair?: Yes Type of Wheelchair: Manual    Wheelchair assist level: Dependent - Patient 0% Max wheelchair distance: 150'    Wheelchair 50 feet with 2 turns activity    Assist        Assist Level: Dependent - Patient 0%   Wheelchair 150 feet activity     Assist      Assist Level: Dependent - Patient 0%   Blood pressure (!) 163/94, pulse 78, temperature 98.7 F (37.1 C), temperature source Oral, resp. rate 18, height 4\' 8"  (1.422 m), weight 49.9 kg, SpO2 97 %.  Medical Problem List and Plan: 1. Functional deficits secondary to poly trauma, pelvic and humeral fracture             -patient may Not shower             -ELOS/Goals: 10-12d sup to min A  -Continue CIR therapies including PT, OT  2.  Antithrombotics: -DVT/anticoagulation:  Mechanical: Sequential compression devices, below knee Bilateral lower extremities.              -antiplatelet therapy: N/A 3. Pain Management: Hydrocodone 3 times daily with as needed oxycodone for breakthrough pain 4. Mood: LCSW to follow for evaluation and support             -antipsychotic agents: N/AA 5. Neuropsych: This patient is capable of making decisions on her own behalf. 6. Skin/Wound Care: Routine pressure-relief measures. 7. Fluids/Electrolytes/Nutrition: Monitor I/O. Check CMET in am. 8. PAF: Off eliquis. Monitor Heart rate TID on coreg BID. 9. HTN: Monitor BP TID-on Norvasc, Coreg and lisinopril Vitals:   10/23/21 0421 10/23/21 0818  BP: (!) 143/57 (!) 163/94  Pulse: 88 78  Resp: 18   Temp: 98.7 F (37.1 C)   SpO2: 97%     -lisinopril held d/t hyperkalemia -consider increasing norvasc if bp continues to run high 10. Hyperkalemia: resolved. Off ACE, 4.5 11/28 11. PMR: Followed by Percival Spanish and has been managed with medrol 4 mg daily.  12. Right humerus fracture: NWB RUE with sling at all times.  --May doff sling for ADLs with right forearm close to  body. 13. Urinary retention: foley reinserted  11/28 -repeat voiding trial this week 14. Constipation: Last BM documented on 11/21 as smal and red. Only smears since then --now having loose stool. -dc miralax, decrease senna-s to 1 tab at hs 15. Nausea: Continue zofran BID. Marland Kitchen   16.  Leukocytosis , afeb on medrol for PMR 17. AKI: Cr improved to 0.94 11/28  -continue to push po fluids, BUN  still 26  LOS: 5 days A FACE TO FACE EVALUATION WAS PERFORMED  Meredith Staggers 10/23/2021, 9:15 AM

## 2021-10-23 NOTE — Care Management (Signed)
Inpatient [facility] Individual Statement of Services  Patient Name:  [name]  Date:  10/23/2021  Welcome to the [facility].  Our goal is to provide you with an individualized program based on your diagnosis and situation, designed to meet your specific needs.  With this comprehensive rehabilitation program, you will be expected to participate in at least 3 hours of rehabilitation therapies Monday-Friday, with modified therapy programming on the weekends.  Your rehabilitation program will include the following services:  Physical Therapy (PT), Occupational Therapy (OT), 24 hour per day rehabilitation nursing, Therapeutic Recreaction (TR), Psychology, Neuropsychology, Care Coordinator, Rehabilitation Medicine, [place], and Other  Weekly team conferences will be held on Tuesdays to discuss your progress.  Your Inpatient Rehabilitation Care Coordinator will talk with you frequently to get your input and to update you on team discussions.  Team conferences with you and your family in attendance may also be held.  Expected length of stay: 2-3 weeks  Overall anticipated outcome: Supervision to Minimal Assistance  Depending on your progress and recovery, your program may change. Your Inpatient Rehabilitation Care Coordinator will coordinate services and will keep you informed of any changes. Your Inpatient Rehabilitation Care Coordinator's name and contact numbers are listed  below.  The following services may also be recommended but are not provided by the [facility] will be made to provide these services after discharge if needed.  Arrangements include referral to agencies that provide these services.  Your insurance has been verified to be:  Medicare A/b  Your primary  doctor is:  Lona Kettle  Pertinent information will be shared with your doctor and your insurance company.  Inpatient Rehabilitation Care Coordinator:  Cathleen Corti [phone] or [phone]  Information discussed with and copy given to patient by: Rana Snare, 10/23/2021, 10:26 AM

## 2021-10-23 NOTE — Progress Notes (Signed)
Physical Therapy Session Note  Patient Details  Name: [name] MRN: [id number] Date of Birth: [birth year]  Today's Date: 10/23/2021 PT Individual Time: 3825-0539 and 1300-1400 PT Individual Time Calculation (min): 28 min and 60 min and Today's Date: 10/23/2021 PT Missed Time: 15 Minutes Missed Time Reason: Patient fatigue  Short Term Goals: Week 1:  PT Short Term Goal 1 (Week 1): Pt will transfer sup to sit w/ min A. PT Short Term Goal 2 (Week 1): Pt will transfer sit to stand w/ min A PT Short Term Goal 3 (Week 1): Pt will amb w/ least restrictive device x 15' w/ min A.  Skilled Therapeutic Interventions/Progress Updates:     1st Session: Pt received seated in [facility] and is reluctantly agreeable to therapy. Reports that she is very fatigued from hour and a half session with OT earlier this AM. Pt agreeable to participating in "light" therapy this morning. Reports 6/10 pain "all over". PT provides rest breaks and mobility to manage pain. WC transport to gym for time management. Pt completes kinetron while seated in WC to work on strength and endurance training as well as reciprocal coordination. Resistance set at 30 cm/sec. Pt cued to completes 2 minutes on and 2 minutes off cycles on kinetron. Pt reports feeling fatigue in legs just proximal to knees. Pt completes 4 cycles of kinetron, reporting soreness in hips following activity. WC transport back to room. Pt left seated in WC with alarm intact and all needs within reach.  2nd Session: Pt received supine in bed and agrees to therapy. No complaint of pain. Supine to sit with bed features and minA, with cues for sequencing and positioning. Stand pivot transfer to [facility] with minA HHA and cues for body mechanics. WC transport to gym. Pt transfers to mat table with minA. Seated at edge of mat, pt performs partial sit-ups, reclining trunk until she lies supine on foam wedge, then returning to upright sitting. X10 reps slowly. Performed for core  strengthening and to improve functional mobility.  Pt performs gait training with large based quad cane. Pt ambulates 12' with 180 degree turn with quad cane and minA/modA. PT provides manual assistance for management of quad cane to facilitate reciprocal gait pattern with appropriate cane placement. Following seated rest break, pt ambulates additional 12' with minA and similar cueing. Sit to supine with modA.  Pt performs 3x10 bridges with verbal and tactile cueing for optimal muscular activation and performance. Pt then performs 1z20 clamshells in hooklying with red theraband. Supine to sit with maxA for efficiency due to pt feeling unwell in supine. Pt requesting to end session due to fatigue. Stand step transfer from mat>WC>bed with minA. Sit to supine with maxA. Left with alarm intact and all needs within reach. Pt misses 15 minutes of skilled PT due to fatigue.   Therapy Documentation Precautions:  Precautions Precautions: Fall Precaution Comments: High fall risk Required Braces or Orthoses: Sling Restrictions Weight Bearing Restrictions: Yes RUE Weight Bearing: Non weight bearing RLE Weight Bearing: Weight bearing as tolerated LLE Weight Bearing: Weight bearing as tolerated Other Position/Activity Restrictions: Sling at all times except for during ADLs; keep elbow close to body when not in sling. No PROM/AROM at R shoulder for 2-3 weeks   Therapy/Group: Individual Therapy  Breck Coons, PT, DPT 10/23/2021, 3:46 PM

## 2021-10-23 NOTE — Progress Notes (Signed)
Patient ID: [name], female   DOB: Feb 01, 1932, 87 y.o.   MRN: [id number]  SW met with pt in room to review statement of service and ELOS 2-3 weeks. Pt is aware SW will follow-up with her dtr [name].   SW left message for pt dtr [name] [phone]) to inform on ELOS, and inform there will be follow-up tomorrow after team conference.  Loralee Pacas, MSW, [place] Office: [phone] Cell: [phone] Fax: [phone]

## 2021-10-23 NOTE — Progress Notes (Signed)
Occupational Therapy Session Note  Patient Details  Name: [name] MRN: [id number] Date of Birth: 09-22-1932  Today's Date: 10/23/2021 OT Individual Time: 0737-0900 OT Individual Time Calculation (min): 83 min    Short Term Goals: Week 1:  OT Short Term Goal 1 (Week 1): Patient will complete 1 step of UB dressing task OT Short Term Goal 2 (Week 1): Patient will complete 1 step of LB dressing task using LRAD OT Short Term Goal 3 (Week 1): Patient will tolerate standing for 2 minutes in preparation for BADL Task  Skilled Therapeutic Interventions/Progress Updates:    Patient greeted semi-reclined in bed and agreeable to OT treatment session. Pt reported that nursing had recently assisted her with washing her lower body and changing her brief from incontinent loose BM. Pt agreeable to still get up to [facility] to see if she could have more continent BM in commode. Pt completed bed mobility with increased time and mod A. Sit<>stand from EOB with min/mod A, then min/mod A to pivot to [facility]. OT assist to manage clothing. Pt fearful of falling with difficulty anteriorly weight shifting with sit<>stands and transfers, requiring facilitation from OT to get her weight forward. Pt with more BM in commode. Pt attempted peri-care, but unable to reach, requiring total A from OT. Stand-pivot back to EOB with min A. UB bathing/dressing completed EOB with education provided for sling management and L UE positioning when out of sling. Pt was able to assist some with UB bathing/dressing, but still needed max A. Pt able to assist with snapping shirt today! Before donning sling, OT educated on edema management of L hand and went through foam squeeze and wrist flex/ext exercises with pt. Sling donned with max A, then pt performed stand-pivot to wc in similar fashion. Pt brought to the sink in wc and OT educated on use of R hand to hold toothbrush and toothpaste to act at diminished level within ROM restrictions. Pt left  seated in wc at end of session with alarm belt on, call bell in reach, and needs met.   Therapy Documentation Precautions:  Precautions Precautions: Fall Precaution Comments: High fall risk Required Braces or Orthoses: Sling Restrictions Weight Bearing Restrictions: Yes RUE Weight Bearing: Non weight bearing RLE Weight Bearing: Weight bearing as tolerated LLE Weight Bearing: Weight bearing as tolerated Other Position/Activity Restrictions: Sling at all times except for during ADLs; keep elbow close to body when not in sling. No PROM/AROM at R shoulder for 2-3 weeks Pain: 5/5 pain in R UE< rest and repositioned for comfort    Therapy/Group: Individual Therapy  [name] 10/23/2021, 8:58 AM

## 2021-10-24 LAB — GLUCOSE, CAPILLARY: Glucose-Capillary: 80 mg/dL (ref 70–99)

## 2021-10-24 NOTE — Progress Notes (Signed)
Patient ID: [name], female   DOB: 03/16/1932, 87 y.o.   MRN: [id number]  SW met with pt and called pt dtr [name] [phone]) while in room to provide updates from team conference, and d/c date 12/9. When discussing that pt currently requires physical assistance, concerns were if pt will require lifting/pulling to get her up as this could be a challenge. SW encouraged family edu to determine if she will be providing care to the patient. SW waiting on follow-up about scheduling as she has to speak with her sister as well.    Loralee Pacas, MSW, [address] Office: [phone] Cell: [phone] Fax: [phone]

## 2021-10-24 NOTE — Progress Notes (Signed)
Foley catheter removed per order. No complications noted. Pt tolerated well. 370mL of yellow cloudy urine noted in bag. Peri care performed. Sheela Stack, LPN

## 2021-10-24 NOTE — Progress Notes (Signed)
Occupational Therapy Session Note  Patient Details  Name: [name] MRN: [id number] Date of Birth: [birth year]  Today's Date: 10/24/2021 OT Individual Time: 7741-4239 OT Individual Time Calculation (min): 56 min   Short Term Goals: Week 1:  OT Short Term Goal 1 (Week 1): Patient will complete 1 step of UB dressing task OT Short Term Goal 2 (Week 1): Patient will complete 1 step of LB dressing task using LRAD OT Short Term Goal 3 (Week 1): Patient will tolerate standing for 2 minutes in preparation for BADL Task  Skilled Therapeutic Interventions/Progress Updates:    Pt greeted semi-reclined in bed and agreeable to OT treatment session. Pt reported she had a descent night and pain controlled. Pt completed bed mobility with mod A to the R side. She reported she thought she could go to the bathroom. Pt completed stand-pivot to [facility] with min A, then needed OT assist to manage clothing. Pt with successful continent BM. Max A for peri-care and donning brief. Pt then pivoted over to wc with mod A and quad cane. Worked on anterior weight shift to reach and try to thread pants today, but pt still needed max A from OT. Grooming tasks completed from wc at the sink with verbal cues to use R hand to hold toothbrush while applying toothpaste. R hand swelling down today as pt stated she has been using squeeze foam consistently. OT took pt through foam squeeze, wrist flex/ext, and finger opposition exercises. Pt left seated in wc at end of session with alarm belt on, call bell in reach, and needs met.    Therapy Documentation Precautions:  Precautions Precautions: Fall Precaution Comments: High fall risk Required Braces or Orthoses: Sling Restrictions Weight Bearing Restrictions: Yes RUE Weight Bearing: Non weight bearing RLE Weight Bearing: Weight bearing as tolerated LLE Weight Bearing: Weight bearing as tolerated Other Position/Activity Restrictions: Sling at all times except for during ADLs; keep  elbow close to body when not in sling. No PROM/AROM at R shoulder for 2-3 weeks Pain: Pain Assessment Pain Scale: 0-10 Pain Score: 4  Pain Location: Elbow Pain Orientation: Right Pain Radiating Towards: leg Pain Intervention(s): Repositioned  Therapy/Group: Individual Therapy  [name] 10/24/2021, 10:31 AM

## 2021-10-24 NOTE — Progress Notes (Signed)
Occupational Therapy Session Note  Patient Details  Name: [name] MRN: [id number] Date of Birth: 01-22-1932  Today's Date: 10/24/2021 OT Individual Time: 1100-1156 session 1 OT Individual Time Calculation (min): 56 min  Session 2: 1500-1527 ( 27 mins)   Short Term Goals: Week 1:  OT Short Term Goal 1 (Week 1): Patient will complete 1 step of UB dressing task OT Short Term Goal 2 (Week 1): Patient will complete 1 step of LB dressing task using LRAD OT Short Term Goal 3 (Week 1): Patient will tolerate standing for 2 minutes in preparation for BADL Task  Skilled Therapeutic Interventions/Progress Updates:  Session 1: pt greeted supine in bed reporting fatigue from prior session but agreeable to OT intervention. Session focus on ADL transfers, BADL reeducation and increasing overall activity tolerance. Pt did share that she is feeling overwhelmed with her situation, dicussed coping strategies to assist with stress mgmt. Pt emotional at times about her current situation and seems to be still processing the trauma of her initial fall. Offered emotional support, active listening and therapeutic use of self. Pt completed x4 stand pivot transfers during session with quad cane, did trial HW as pt reports quad cane makes her nervous however with increased practice, pt now more comfortable with quad cane. Pt needs increased rest breaks in between each transfer d/t decreased activity tolerance and general deconditioning. Pt completed stand pivot to [facility] with MIN A with quad cane, total A for toileting. Pt reports the plan is for her to DC to her daughters house however her  daughter is on a RW and has "bad knees." Pt reports she does think her daughter will be able to help her with toileting. Brier for stand pivot back to bed, MINA for sit>supine. Pt left supine in bed with bed alarm activated and all needs within reach.   Session 2: pt greeted supine in bed, agreeable to OT intervention. Pt reports  feeling like she may have voided. Assisted pt to EOB with MOD A needing assist to scoot hips to EOB. Pt completed sit<>stand with MIN HHA. Pt required total A to doff brief in standing as small amount of urine noted in brief; RN and NT aware. Pt was able  to complete anterior pericare from sitting with set- up assist. Pt needed MAX A to don new brief but was able to assist with pulling brief up to waist line in the front on the L side! Pt completed hand hygiene from EOB with set- up assist. Pt needed MOD A for sit>supine needing most assist to lower trunk into side lying and elevate BLEs to EOB, pt left supine in bed with bed alarm activated and all needs within reach.   Therapy Documentation Precautions:  Precautions Precautions: Fall Precaution Comments: High fall risk Required Braces or Orthoses: Sling Restrictions Weight Bearing Restrictions: Yes RUE Weight Bearing: Non weight bearing RLE Weight Bearing: Weight bearing as tolerated LLE Weight Bearing: Weight bearing as tolerated Other Position/Activity Restrictions: Sling at all times except for during ADLs; keep elbow close to body when not in sling. No PROM/AROM at R shoulder for 2-3 weeks  Pain: unrated pain in RUE, offered rest breaks and repositioning as needed throughout sessions.      Therapy/Group: Individual Therapy  Precious Haws 10/24/2021, 12:26 PM

## 2021-10-24 NOTE — Progress Notes (Signed)
PROGRESS NOTE   Subjective/Complaints: Pt in bed. Loose stool stopped. Pain controlled.  ROS: Patient denies fever, rash, sore throat, blurred vision, nausea, vomiting, diarrhea, cough, shortness of breath or chest pain, headache, or mood change.    Objective:   No results found. Recent Labs    10/23/21 0531  WBC 9.2  HGB 8.9*  HCT 27.6*  PLT 273   Recent Labs    10/22/21 0613 10/23/21 0531  NA 132* 133*  K 4.9 4.5  CL 105 106  CO2 18* 22  GLUCOSE 87 81  BUN 31* 26*  CREATININE 1.07* 0.94  CALCIUM 7.8* 7.7*    Intake/Output Summary (Last 24 hours) at 10/24/2021 0856 Last data filed at 10/24/2021 9758 Gross per 24 hour  Intake 840 ml  Output 950 ml  Net -110 ml     Pressure Injury 10/23/21 Elbow Posterior;Right Stage 2 -  Partial thickness loss of dermis presenting as a shallow open injury with a red, pink wound bed without slough. (Active)  10/23/21 1626  Location: Elbow  Location Orientation: Posterior;Right  Staging: Stage 2 -  Partial thickness loss of dermis presenting as a shallow open injury with a red, pink wound bed without slough.  Wound Description (Comments):   Present on Admission:     Physical Exam: Vital Signs Blood pressure (!) 141/54, pulse 83, temperature 97.9 F (36.6 C), temperature source Oral, resp. rate 18, height 4\' 8"  (1.422 m), weight 49.9 kg, SpO2 97 %. Constitutional: No distress . Vital signs reviewed. HEENT: NCAT, EOMI, oral membranes moist Neck: supple Cardiovascular: RRR without murmur. No JVD    Respiratory/Chest: CTA Bilaterally without wheezes or rales. Normal effort    GI/Abdomen: BS +, non-tender, non-distended Ext: no clubbing, cyanosis, or edema Psych: pleasant and cooperative  Skin: right elbow wound persists. no evidence of rash, mult facial ecchymoses are improving, steristrips over right eyebrow  Neurologic: very alert. Appropriate. Cranial nerves II  through XII intact, motor strength is 5/5 in left deltoid, bicep, tricep, grip, 4/5 Right and 5/5 Left  Hip flexor, knee extensors, ankle dorsiflexor and plantar flexor Sensory exam normal sensation to light touch  in bilateral upper and lower extremities Musculoskeletal: RUE in sling has good grip , color and temp. RUE 1+ edema   Assessment/Plan: 1. Functional deficits which require 3+ hours per day of interdisciplinary therapy in a comprehensive inpatient rehab setting. Physiatrist is providing close team supervision and 24 hour management of active medical problems listed below. Physiatrist and rehab team continue to assess barriers to discharge/monitor patient progress toward functional and medical goals  Care Tool:  Bathing    Body parts bathed by patient: Chest, Abdomen, Face   Body parts bathed by helper: Right arm, Left arm, Front perineal area, Right upper leg, Buttocks, Left upper leg, Right lower leg, Left lower leg     Bathing assist Assist Level: Maximal Assistance - Patient 24 - 49%     Upper Body Dressing/Undressing Upper body dressing   What is the patient wearing?: Button up shirt    Upper body assist Assist Level: Total Assistance - Patient < 25%    Lower Body Dressing/Undressing Lower body dressing  What is the patient wearing?: Incontinence brief, Pants     Lower body assist Assist for lower body dressing: Total Assistance - Patient < 25%     Toileting Toileting    Toileting assist Assist for toileting: Total Assistance - Patient < 25%     Transfers Chair/bed transfer  Transfers assist     Chair/bed transfer assist level: Minimal Assistance - Patient > 75% Chair/bed transfer assistive device: Armrests, Cane (quad cane)   Locomotion Ambulation   Ambulation assist      Assist level: Minimal Assistance - Patient > 75% Assistive device: Cane-quad Max distance: 69ft   Walk 10 feet activity   Assist     Assist level: Moderate  Assistance - Patient - 50 - 74% Assistive device: Hand held assist   Walk 50 feet activity   Assist Walk 50 feet with 2 turns activity did not occur: Safety/medical concerns         Walk 150 feet activity   Assist Walk 150 feet activity did not occur: Safety/medical concerns         Walk 10 feet on uneven surface  activity   Assist Walk 10 feet on uneven surfaces activity did not occur: Safety/medical concerns         Wheelchair     Assist Is the patient using a wheelchair?: Yes Type of Wheelchair: Manual    Wheelchair assist level: Dependent - Patient 0% Max wheelchair distance: 150'    Wheelchair 50 feet with 2 turns activity    Assist        Assist Level: Dependent - Patient 0%   Wheelchair 150 feet activity     Assist      Assist Level: Dependent - Patient 0%   Blood pressure (!) 141/54, pulse 83, temperature 97.9 F (36.6 C), temperature source Oral, resp. rate 18, height 4\' 8"  (1.422 m), weight 49.9 kg, SpO2 97 %.  Medical Problem List and Plan: 1. Functional deficits secondary to poly trauma, pelvic and humeral fracture             -patient may Not shower             -ELOS/Goals: 10-12d sup to min A  -Continue CIR therapies including PT and OT. Interdisciplinary team conference today to discuss goals, barriers to discharge, and dc planning.  2.  Antithrombotics: -DVT/anticoagulation:  Mechanical: Sequential compression devices, below knee Bilateral lower extremities.              -antiplatelet therapy: N/A 3. Pain Management: Hydrocodone 3 times daily with as needed oxycodone for breakthrough pain 4. Mood: LCSW to follow for evaluation and support             -antipsychotic agents: N/AA 5. Neuropsych: This patient is capable of making decisions on her own behalf. 6. Skin/Wound Care: Routine pressure-relief measures. 7. Fluids/Electrolytes/Nutrition: Monitor I/O. Check CMET in am. 8. PAF: Off eliquis. Monitor Heart rate TID on  coreg BID. 9. HTN: Monitor BP TID-on Norvasc, Coreg and lisinopril Vitals:   10/24/21 0535 10/24/21 0700  BP: (!) 155/54 (!) 141/54  Pulse: 87 83  Resp: 18   Temp: 97.9 F (36.6 C)   SpO2: 97%     -lisinopril held d/t hyperkalemia -consider increasing norvasc if bp continues to run high -11/29 bp controlled 10. Hyperkalemia: resolved. Off ACE, 4.5 11/28 11. PMR: Followed by Percival Spanish and has been managed with medrol 4 mg daily.  12. Right humerus fracture: NWB RUE with sling at all  times.  --May doff sling for ADLs with right forearm close to body. 13. Urinary retention: foley reinserted  11/29 -d/w pt will remove foley and begin another voiding trial 14. Constipation: Last BM documented on 11/21 as smal and red. Only smears since then --loose stool stopped -dc'ed miralax -continue senna-s 1 tab at hs, diet, liquids discussed 15. Nausea: Continue zofran BID. Marland Kitchen   16.  Leukocytosis , afeb on medrol for PMR 17. AKI: Cr improved to 0.94 11/28  -continue to push po fluids, BUN still 26  -recheck later this week LOS: 6 days A FACE TO FACE EVALUATION WAS PERFORMED  Meredith Staggers 10/24/2021, 8:56 AM

## 2021-10-24 NOTE — Progress Notes (Signed)
Physical Therapy Session Note  Patient Details  Name: [name] MRN: [id number] Date of Birth: 1932-03-09  Today's Date: 10/24/2021 PT Individual Time: 1335-1430 PT Individual Time Calculation (min): 55 min   Short Term Goals: Week 1:  PT Short Term Goal 1 (Week 1): Pt will transfer sup to sit w/ min A. PT Short Term Goal 2 (Week 1): Pt will transfer sit to stand w/ min A PT Short Term Goal 3 (Week 1): Pt will amb w/ least restrictive device x 15' w/ min A.  Skilled Therapeutic Interventions/Progress Updates:     Patient in bed with CSW discussing d/c planning with patient's daughter on the phone upon PT arrival. Patient alert and agreeable to PT session. Patient reported 9/10 lower extremity pain R>L during session, RN made aware and provided pain meds at beginning of session. PT provided repositioning, rest breaks, and distraction as pain interventions throughout session.   Therapeutic Activity: Bed Mobility: Patient performed supine to/from sit with min A-CGA in a flat bed without use of bed rails to simulate home set up. Provided verbal cues for progressing through L side-lying with use of L elbow to push up to sit and control trunk for lying. Patient sat EOB with supervision for sitting balance as non-skid socks were doffed and regular socks and tennis shoes donned, poor fit with socks, doffed and donned shoes without socks all with total A for energy and time management. Shoes with good fit without socks, not pain or signs of skin breakdown at end of session.  Transfers: Patient performed stand pivot bed<>w/c with HHA and min A x1 and Lofstrand crutch and CGA x1. She performed sit to/from stand x4 with min A-CGA with HHA, L rail, and Lofstrand crutch. Provided verbal cues for forward weight shift and use of AD.  Gait Training:  Patient ambulated 20 feet with min A with HHA, then 20 feet x1 and 15 feet x2 using L Lofstrand crutch with CGA and intermittent min A. Ambulated with  step-through gait pattern, increased L trunk lean, mild forward trunk flexion, and decreased gait speed, step height, and step length. Provided verbal cues for erect posture, sequencing with crutch, and reduced trunk lean (improved with adjustment to crutch height on last trial).  Patient required increased time and rest breaks with all mobility. During rest breaks, educated on functional progress during session, progression from AD to no AD with increased strength and reduced pain, and recreational activities to improve quality of life during recovery.  Patient in bed at end of session with breaks locked, bed alarm set, and all needs within reach.   Therapy Documentation Precautions:  Precautions Precautions: Fall Precaution Comments: High fall risk Required Braces or Orthoses: Sling Restrictions Weight Bearing Restrictions: Yes RUE Weight Bearing: Non weight bearing RLE Weight Bearing: Weight bearing as tolerated LLE Weight Bearing: Weight bearing as tolerated Other Position/Activity Restrictions: Sling at all times except for during ADLs; keep elbow close to body when not in sling. No PROM/AROM at R shoulder for 2-3 weeks    Therapy/Group: Individual Therapy  Meridian Scherger L Kaiyah Eber PT, DPT  10/24/2021, 3:55 PM

## 2021-10-24 NOTE — Progress Notes (Signed)
Pt bladder scanned for 30mL. Pt voided very little since foley removal. Pt encouraged to drink more water. Sheela Stack, LPN

## 2021-10-24 NOTE — Patient Care Conference (Signed)
Inpatient RehabilitationTeam Conference and Plan of Care Update Date: 10/24/2021   Time: 10:30 AM    Patient Name: [name]      Medical Record Number: [id number]  Date of Birth: 1932/03/03 Sex: Female         Room/Bed: 4M02C/4M02C-01 Payor Info: Payor: MEDICARE / Plan: MEDICARE PART A AND B / Product Type: *No Product type* /    Admit Date/Time:  10/18/2021  8:07 PM  Primary Diagnosis:  Pelvic fracture [facility])  Hospital Problems: Principal Problem:   Pelvic fracture [facility])    Expected Discharge Date: Expected Discharge Date: 11/03/21  Team Members Present: Physician leading conference: Dr. Alger Simons Social Worker Present: Loralee Pacas, [place] Nurse Present: Dorthula Nettles, RN PT Present: Tereasa Coop, PT OT Present: Cherylynn Ridges, OT PPS Coordinator present : Gunnar Fusi, SLP     Current Status/Progress Goal Weekly Team Focus  Bowel/Bladder   cont. of bowel, Foley in Place 11/27  Gain full cont.  assess q shift and PRN   Swallow/Nutrition/ Hydration             ADL's   Max A BADL tasks, Min/mod A sit<>stands and transfers  CGA transfers, Min/mod A BADL tasks  Self-care retraining, pain management, functional transfers   Mobility   modA bed mobility, modA sit to stand, modA gait x12' with quad cane  supervision/CGA  activity tolerance, ambulation, transfers, bed mobility   Communication             Safety/Cognition/ Behavioral Observations            Pain   pt has pain level of 8>10  Pain >4  assess pain q shift and PRN   Skin   Echymossis (Bue, Face, Arm, Abdomen, leg) skin tear to Bue, LLE, Laceration to BLE, Stage 2 elbow (11/28) skin tear (RLE) 11/28  [facility]  assess skin q shift,PRN, offloading elbows, heels when in bed     Discharge Planning:  D/c to home with dtr [name] who will provide 24/7 care. Pt will need to be supervision level of care at discharge as dtr uses RW/cane at times due to knee issues.   Team  Discussion: Discontinuing Foley, will begin voiding trial. Will manage GI conservatively. Incontinent bowel, pain to right shoulder, bruising, and multiple skin tears. Discharging home with daughter. Needs lots of assist with ADL's. Patient on target to meet rehab goals: yes, max assist for ADL's, mod assist for mobility, and able to stand with min assist but knees will buckle. Min/mod assist goals.  *See Care Plan and progress notes for long and short-term goals.   Revisions to Treatment Plan:  Discontinuing Foley, adjusting medications.  Teaching Needs: Family education, medication/pain management, skin/wound care, safety awareness, transfer training, etc.  Current Barriers to Discharge: Decreased caregiver support, Home enviroment access/layout, Incontinence, Wound care, and Lack of/limited family support  Possible Resolutions to Barriers: Family education Begin voiding trial Monitor skin daily     Medical Summary Current Status: returned after GIB. pain control improved although still a barrier at times, urine retention--foley  Barriers to Discharge: Medical stability   Possible Resolutions to [employer] Focus: daily assessment of labs, pt data. voiding trial, pain mgt   Continued Need for Acute Rehabilitation Level of Care: The patient requires daily medical management by a physician with specialized training in physical medicine and rehabilitation for the following reasons: Direction of a multidisciplinary physical rehabilitation program to maximize functional independence : Yes Medical management of patient stability for  increased activity during participation in an intensive rehabilitation regime.: Yes Analysis of laboratory values and/or radiology reports with any subsequent need for medication adjustment and/or medical intervention. : Yes   I attest that I was present, lead the team conference, and concur with the assessment and plan of the team.   Cristi Loron 10/24/2021, 2:04 PM

## 2021-10-25 DIAGNOSIS — R339 Retention of urine, unspecified: Secondary | ICD-10-CM

## 2021-10-25 MED ORDER — AMLODIPINE BESYLATE 10 MG PO TABS
10.0000 mg | ORAL_TABLET | Freq: Every day | ORAL | Status: DC
  Administered 2021-10-26 – 2021-10-31 (×6): 10 mg via ORAL
  Filled 2021-10-25 (×6): qty 1

## 2021-10-25 MED ORDER — AMLODIPINE BESYLATE 5 MG PO TABS
5.0000 mg | ORAL_TABLET | Freq: Once | ORAL | Status: AC
  Administered 2021-10-25: 5 mg via ORAL

## 2021-10-25 NOTE — Progress Notes (Signed)
PROGRESS NOTE   Subjective/Complaints: No new issues. Appears to be voiding but volume not much. PVR's low too. Pain reasonable  ROS: Patient denies fever, rash, sore throat, blurred vision, nausea, vomiting, diarrhea, cough, shortness of breath or chest pain,   headache, or mood change. .    Objective:   No results found. Recent Labs    10/23/21 0531  WBC 9.2  HGB 8.9*  HCT 27.6*  PLT 273   Recent Labs    10/23/21 0531  NA 133*  K 4.5  CL 106  CO2 22  GLUCOSE 81  BUN 26*  CREATININE 0.94  CALCIUM 7.7*    Intake/Output Summary (Last 24 hours) at 10/25/2021 0811 Last data filed at 10/25/2021 0744 Gross per 24 hour  Intake 1200 ml  Output 750 ml  Net 450 ml     Pressure Injury 10/23/21 Elbow Posterior;Right Stage 2 -  Partial thickness loss of dermis presenting as a shallow open injury with a red, pink wound bed without slough. (Active)  10/23/21 Dorthula Nettles, RN3, [place]) [phone]  Location: Elbow  Location Orientation: Posterior;Right  Staging: Stage 2 -  Partial thickness loss of dermis presenting as a shallow open injury with a red, pink wound bed without slough.  Wound Description (Comments):   Present on Admission:     Physical Exam: Vital Signs Blood pressure (!) 168/69, pulse (!) 104, temperature 97.8 F (36.6 C), temperature source Oral, resp. rate 16, height 4\' 8"  (1.422 m), weight 48.3 kg, SpO2 100 %. Constitutional: No distress . Vital signs reviewed. HEENT: NCAT, EOMI, oral membranes moist Neck: supple Cardiovascular: RRR without murmur. No JVD    Respiratory/Chest: CTA Bilaterally without wheezes or rales. Normal effort    GI/Abdomen: BS +, non-tender, non-distended Ext: no clubbing, cyanosis  Psych: pleasant and cooperative   Skin: right elbow wound persists. no evidence of rash, mult facial ecchymoses are improving, steristrips over right eyebrow  Neurologic: very alert. Appropriate.  Cranial nerves II through XII intact, motor strength is 5/5 in left deltoid, bicep, tricep, grip, 4/5 Right and 5/5 Left  Hip flexor, knee extensors, ankle dorsiflexor and plantar flexor Sensory exam normal sensation to light touch  in bilateral upper and lower extremities Musculoskeletal: RUE in sling has good grip , color and temp. RUE tr edema   Assessment/Plan: 1. Functional deficits which require 3+ hours per day of interdisciplinary therapy in a comprehensive inpatient rehab setting. Physiatrist is providing close team supervision and 24 hour management of active medical problems listed below. Physiatrist and rehab team continue to assess barriers to discharge/monitor patient progress toward functional and medical goals  Care Tool:  Bathing    Body parts bathed by patient: Chest, Abdomen, Face   Body parts bathed by helper: Right arm, Left arm, Front perineal area, Right upper leg, Buttocks, Left upper leg, Right lower leg, Left lower leg     Bathing assist Assist Level: Maximal Assistance - Patient 24 - 49%     Upper Body Dressing/Undressing Upper body dressing   What is the patient wearing?: Button up shirt    Upper body assist Assist Level: Total Assistance - Patient < 25%    Lower  Body Dressing/Undressing Lower body dressing      What is the patient wearing?: Incontinence brief, Pants     Lower body assist Assist for lower body dressing: Total Assistance - Patient < 25%     Toileting Toileting    Toileting assist Assist for toileting: Total Assistance - Patient < 25%     Transfers Chair/bed transfer  Transfers assist     Chair/bed transfer assist level: Minimal Assistance - Patient > 75% Chair/bed transfer assistive device: Armrests, Cane   Locomotion Ambulation   Ambulation assist      Assist level: Minimal Assistance - Patient > 75% Assistive device: Cane-quad Max distance: 97ft   Walk 10 feet activity   Assist     Assist level:  Moderate Assistance - Patient - 50 - 74% Assistive device: Hand held assist   Walk 50 feet activity   Assist Walk 50 feet with 2 turns activity did not occur: Safety/medical concerns         Walk 150 feet activity   Assist Walk 150 feet activity did not occur: Safety/medical concerns         Walk 10 feet on uneven surface  activity   Assist Walk 10 feet on uneven surfaces activity did not occur: Safety/medical concerns         Wheelchair     Assist Is the patient using a wheelchair?: Yes Type of Wheelchair: Manual    Wheelchair assist level: Dependent - Patient 0% Max wheelchair distance: 150'    Wheelchair 50 feet with 2 turns activity    Assist        Assist Level: Dependent - Patient 0%   Wheelchair 150 feet activity     Assist      Assist Level: Dependent - Patient 0%   Blood pressure (!) 168/69, pulse (!) 104, temperature 97.8 F (36.6 C), temperature source Oral, resp. rate 16, height 4\' 8"  (1.422 m), weight 48.3 kg, SpO2 100 %.  Medical Problem List and Plan: 1. Functional deficits secondary to poly trauma, pelvic and humeral fracture             -patient may Not shower             -ELOS/Goals: 10-12d sup to min A  -Continue CIR therapies including PT, OT  2.  Antithrombotics: -DVT/anticoagulation:  Mechanical: Sequential compression devices, below knee Bilateral lower extremities.              -antiplatelet therapy: N/A 3. Pain Management: Hydrocodone 3 times daily with as needed oxycodone for breakthrough pain 4. Mood: LCSW to follow for evaluation and support             -antipsychotic agents: N/AA 5. Neuropsych: This patient is capable of making decisions on her own behalf. 6. Skin/Wound Care: Routine pressure-relief measures. 7. Fluids/Electrolytes/Nutrition: Monitor I/O. Check CMET in am. 8. PAF: Off eliquis. Monitor Heart rate TID on coreg BID. 9. HTN: Monitor BP TID-on Norvasc, Coreg and lisinopril Vitals:   10/24/21  2038 10/25/21 0331  BP: (!) 158/60 (!) 168/69  Pulse: 82 (!) 104  Resp: 14 16  Temp: 98.7 F (37.1 C) 97.8 F (36.6 C)  SpO2: 99% 100%    -lisinopril held d/t hyperkalemia -11.30 increase norvasc to 10mg  10. Hyperkalemia: resolved. Off ACE, 4.5 11/28 11. PMR: Followed by Percival Spanish and has been managed with medrol 4 mg daily.  12. Right humerus fracture: NWB RUE with sling at all times.  --May doff sling for ADLs with  right forearm close to body. 13. Urinary retention: foley reinserted  11/30 continue voiding trial   -encourage fluids 14. Constipation: Last BM documented on 11/21 as smal and red. Only smears since then --loose stool stopped -dc'ed miralax -continue senna-s 1 tab at hs, diet, liquids discussed 15. Nausea: Continue zofran BID. Marland Kitchen   16.  Leukocytosis , afeb on medrol for PMR 17. AKI: Cr improved to 0.94 11/28  -continue to push po fluids, BUN still 26  -recheck 12/1 LOS: 7 days A FACE TO [place] 10/25/2021, 8:11 AM

## 2021-10-25 NOTE — Progress Notes (Signed)
Patient ID: [name], female   DOB: [birth year], 87 y.o.   MRN: [id number]  SW received phone call from pt dtr [name] reporting her and her sister will come in for family edu on Friday (12/2) [place], MSW, [place] Office: [phone] Cell: [phone] Fax: [phone]

## 2021-10-25 NOTE — Progress Notes (Signed)
Occupational Therapy Session Note  Patient Details  Name: [name] MRN: [id number] Date of Birth: [birth year]  Today's Date: 10/25/2021 OT Individual Time: 1000-1110 OT Individual Time Calculation (min): 70 min    Short Term Goals: Week 1:  OT Short Term Goal 1 (Week 1): Patient will complete 1 step of UB dressing task OT Short Term Goal 2 (Week 1): Patient will complete 1 step of LB dressing task using LRAD OT Short Term Goal 3 (Week 1): Patient will tolerate standing for 2 minutes in preparation for BADL Task  Skilled Therapeutic Interventions/Progress Updates:    Pt resting in w/c upon arrival. Pt just returned to room from PT sessions and reports she is "exhausted." Pt with considerable edema in Rt hand. Edema relieved after gentle retrograde massage. Pt educated on performed massage when sitting in chair/bed. Pt also educated on elevating RUE. Sling adjusted for comfort. Sit<>stand with min A X 5. Stand pivot transfer to EOB with min A HHA. Pt required assistance lifting BLE onto bed and repositioning in bed. Pt remained in bed with all needs within reach and bed alarm activated.   Therapy Documentation Precautions:  Precautions Precautions: Fall Precaution Comments: High fall risk Required Braces or Orthoses: Sling Restrictions Weight Bearing Restrictions: Yes RUE Weight Bearing: Non weight bearing RLE Weight Bearing: Weight bearing as tolerated LLE Weight Bearing: Weight bearing as tolerated Other Position/Activity Restrictions: Sling at all times except for during ADLs; keep elbow close to body when not in sling. No PROM/AROM at R shoulder for 2-3 weeks Pain: Pt reports Rt shoulder/back discomfort (unrated); repositioned Therapy/Group: Individual Therapy  Leroy Libman 10/25/2021, 3:00 PM

## 2021-10-25 NOTE — Progress Notes (Signed)
Physical Therapy Session Note  Patient Details  Name: [name] MRN: [id number] Date of Birth: [birth year]  Today's Date: 10/25/2021 PT Individual Time: 0920-1000 and 2992-4268 PT Individual Time Calculation (min): 40 min and 53 min  Short Term Goals: Week 1:  PT Short Term Goal 1 (Week 1): Pt will transfer sup to sit w/ min A. PT Short Term Goal 2 (Week 1): Pt will transfer sit to stand w/ min A PT Short Term Goal 3 (Week 1): Pt will amb w/ least restrictive device x 15' w/ min A.  Skilled Therapeutic Interventions/Progress Updates:     Pt received supine in bed and agrees to therapy. Reports "pretty bad" pain all over. Number not provided. PT provides mobility and rest breaks to manage pain. Supine to sit with modA and tactile cues at scapula and thigh for body mechanics. Sit to stand with minA and cues for posture and upright gaze. Pt ambulates to toilet, x15' with lofstrand crutch and minA/modA, primarily for crutch management as well stability and upright posture, as pt tends to flex forward with anxiety about falling. ModA for toilet transfer and tactile cues for increased eccentric contraction of stand to sit. Pt continent of urine and independent with pericare. Pt performs sit to stand from toilet with modA cues at trunk, then ambulates x10' with crutch. WC transport to gym for time management and energy conservation.  Sit to stand with minA. Pt ambulates x30' without AD with consistent modA for upright posture and stability, with cues for hip and trunk extension, as well as adequately shifting weight laterally for clearance of contralateral foot. Following seated rest break, pt ambulates additional 25' with lofstrand crutch and improved stability, only requiring minA with cues for increasing stride length to decrease risk for falls.   Pt left seated in WC with alarm intact and all needs within reach.  2nd Session: Pt received supine in bed and agrees to therapy. Reports pain in back  and buttocks from "being sore". Number not provided. PT provides mobility and rest breaks to manage pain. Supine to sit very slowly with modA manual cues at trunk and hips. Sit to stand with minA and cues for anterior weight shifting and upright gaze and posture. Stand step transfer to [facility] with minA. WC transport to gym for time management. Pt performs stand step transfer to Nustep with minA and cues for hand placement and body mechanics. Pt completes Nustep for strength and endurance training. Theraband used to facilitate L foot stability in foot plate. Pt completes x14:00 total with several rest breaks, at workload of 3 with average steps per minute ~40. Pt reports significant fatigue and requires extended seated rest break following activity prior to attempting transfer back to [facility]. PT provides education on importance of nutrition for healing and functional recovery during rest break. Pt then performs stand pivot from Nustep>WC>bed with minA and cues for anterior weight shifting. Sit to supine with modA/maxA. Left with alarm intact and all needs within reach.  Therapy Documentation Precautions:  Precautions Precautions: Fall Precaution Comments: High fall risk Required Braces or Orthoses: Sling Restrictions Weight Bearing Restrictions: Yes RUE Weight Bearing: Non weight bearing RLE Weight Bearing: Weight bearing as tolerated LLE Weight Bearing: Weight bearing as tolerated Other Position/Activity Restrictions: Sling at all times except for during ADLs; keep elbow close to body when not in sling. No PROM/AROM at R shoulder for 2-3 weeks   Therapy/Group: Individual Therapy  Breck Coons, PT, DPT 10/25/2021, 4:20 PM

## 2021-10-25 NOTE — Plan of Care (Signed)
  Problem: Consults Goal: RH GENERAL PATIENT EDUCATION Description: See Patient Education module for education specifics. Outcome: Progressing Goal: Skin Care Protocol Initiated - if Braden Score 18 or less Description: If consults are not indicated, leave blank or document N/A Outcome: Progressing   Problem: RH BOWEL ELIMINATION Goal: RH STG MANAGE BOWEL WITH ASSISTANCE Description: STG Manage Bowel with min Assistance. Outcome: Progressing Goal: RH STG MANAGE BOWEL W/MEDICATION W/ASSISTANCE Description: STG Manage Bowel with Medication with min Assistance. Outcome: Progressing   Problem: RH BLADDER ELIMINATION Goal: RH STG MANAGE BLADDER WITH ASSISTANCE Description: STG Manage Bladder With min Assistance Outcome: Progressing Goal: RH STG MANAGE BLADDER WITH MEDICATION WITH ASSISTANCE Description: STG Manage Bladder With Medication With min Assistance. Outcome: Progressing   Problem: RH SKIN INTEGRITY Goal: RH STG MAINTAIN SKIN INTEGRITY WITH ASSISTANCE Description: STG Maintain Skin Integrity With min Assistance. Outcome: Progressing Goal: RH STG ABLE TO PERFORM INCISION/WOUND CARE W/ASSISTANCE Description: STG Able To Perform Incision/Wound Care With min Assistance. Outcome: Progressing   Problem: RH SAFETY Goal: RH STG ADHERE TO SAFETY PRECAUTIONS W/ASSISTANCE/DEVICE Description: STG Adhere to Safety Precautions With Cues and reminders. Outcome: Progressing Goal: RH STG DECREASED RISK OF FALL WITH ASSISTANCE Description: STG Decreased Risk of Fall With min Assistance. Outcome: Progressing   Problem: RH PAIN MANAGEMENT Goal: RH STG PAIN MANAGED AT OR BELOW PT'S PAIN GOAL Description: < 3 on a 0-10 pain scale. Outcome: Progressing   Problem: RH KNOWLEDGE DEFICIT GENERAL Goal: RH STG INCREASE KNOWLEDGE OF SELF CARE AFTER HOSPITALIZATION Description: Patient will demonstrate knowledge of medication/pain management, skin/wound care, weight bearing precautions, and fall  precautions with educational materials and handouts provided by staff independently at discharge. Outcome: Progressing

## 2021-10-25 NOTE — Progress Notes (Signed)
Occupational Therapy Session Note  Patient Details  Name: [name] MRN: [id number] Date of Birth: 15-Nov-1932  Today's Date: 10/25/2021 OT Individual Time: 8850-2774 OT Individual Time Calculation (min): 32 min    Short Term Goals: Week 1:  OT Short Term Goal 1 (Week 1): Patient will complete 1 step of UB dressing task OT Short Term Goal 2 (Week 1): Patient will complete 1 step of LB dressing task using LRAD OT Short Term Goal 3 (Week 1): Patient will tolerate standing for 2 minutes in preparation for BADL Task  Skilled Therapeutic Interventions/Progress Updates:  Pt greeted supine in bed reporting fatigue as pt had just finished with PT and had just gotten back in bed. pt agreeable to bed level session.  Session focus on  edema mgmt and light therex to decrease swelling and increased AROM in RUE digits. Issued pt level 2 compliant cube to carryover digit flexion/extension. Pt additionally completed therex as indicated below: X10 thumb opposition X10 wrist flexion/ extension X10 wrist ulnar/radial deviation X10 digit spreading X10 digit flexion/extension with compliant cube Issued pt HEP to increase carryover with pt able to teach back therex when visual aid provided.   pt left supine in bed with bed alarm activated and all needs within reach.                      Therapy Documentation Precautions:  Precautions Precautions: Fall Precaution Comments: High fall risk Required Braces or Orthoses: Sling Restrictions Weight Bearing Restrictions: Yes RUE Weight Bearing: Non weight bearing RLE Weight Bearing: Weight bearing as tolerated LLE Weight Bearing: Weight bearing as tolerated Other Position/Activity Restrictions: Sling at all times except for during ADLs; keep elbow close to body when not in sling. No PROM/AROM at R shoulder for 2-3 weeks   Pain: unrated pain in R forearm reported with therex, rest and repositioning provided as needed during session     Therapy/Group:  Individual Therapy  Precious Haws 10/25/2021, 3:56 PM

## 2021-10-26 LAB — BASIC METABOLIC PANEL
Anion gap: 8 (ref 5–15)
BUN: 36 mg/dL — ABNORMAL HIGH (ref 8–23)
CO2: 19 mmol/L — ABNORMAL LOW (ref 22–32)
Calcium: 7.8 mg/dL — ABNORMAL LOW (ref 8.9–10.3)
Chloride: 100 mmol/L (ref 98–111)
Creatinine, Ser: 1.42 mg/dL — ABNORMAL HIGH (ref 0.44–1.00)
GFR, Estimated: 36 mL/min — ABNORMAL LOW (ref >60)
Glucose, Bld: 91 mg/dL (ref 70–99)
Potassium: 5.6 mmol/L — ABNORMAL HIGH (ref 3.5–5.1)
Sodium: 127 mmol/L — ABNORMAL LOW (ref 135–145)

## 2021-10-26 MED ORDER — SODIUM ZIRCONIUM CYCLOSILICATE 5 G PO PACK
5.0000 g | PACK | Freq: Once | ORAL | Status: AC
  Administered 2021-10-26: 5 g via ORAL
  Filled 2021-10-26: qty 1

## 2021-10-26 MED ORDER — SODIUM CHLORIDE 0.9 % IV SOLN: INTRAVENOUS | Status: DC

## 2021-10-26 NOTE — Progress Notes (Signed)
Physical Therapy Session Note  Patient Details  Name: [name] MRN: [id number] Date of Birth: 1932/03/19  Today's Date: 10/26/2021 PT Individual Time: 1020-1135 PT Individual Time Calculation (min): 75 min   Short Term Goals: Week 1:  PT Short Term Goal 1 (Week 1): Pt will transfer sup to sit w/ min A. PT Short Term Goal 2 (Week 1): Pt will transfer sit to stand w/ min A PT Short Term Goal 3 (Week 1): Pt will amb w/ least restrictive device x 15' w/ min A.  Skilled Therapeutic Interventions/Progress Updates:     Pt received supine in bed and agrees to therapy. Reports 7/10 pain in R hip and thigh. PT provides mobility and rest breaks to manage pain. Supine to sit with bed features, slowly with verbal cues on sequencing. Pt performs sit to stand with minA, with tactile cues to facilitate anterior weight shift and upright posture to improve balance. Stand step transfer to [facility] with minA and cues for sequencing. WC transport to gym for time management. Pt completes Kinetron for strength, endurance, and coordination training. Pt completes 2 minutes on, 2 minutes off routine with resistance set to 30 cm/sec.  Pt completes x5 cycles, requiring extended seated rest break prior to additional mobility secondary to fatigue.  Pt ambulates with L lofstrand crutch, x60' and x75' with extended seated rest break in between. PT provides minA/modA with cues for increasing stride length and safe positioning of crutch, especially during transitional movements and turns.   WC transport back to room. Pt verbalizes need for bowel movement. Stand pivot transfer to toilet with modA and multimodal cues for increased eccentric control of stand to sit. Left seated on toilet with RN present.    Therapy Documentation Precautions:  Precautions Precautions: Fall Precaution Comments: High fall risk Required Braces or Orthoses: Sling Restrictions Weight Bearing Restrictions: Yes RUE Weight Bearing: Non weight  bearing RLE Weight Bearing: Weight bearing as tolerated LLE Weight Bearing: Weight bearing as tolerated Other Position/Activity Restrictions: Sling at all times except for during ADLs; keep elbow close to body when not in sling. No PROM/AROM at R shoulder for 2-3 weeks    Therapy/Group: Individual Therapy  Breck Coons, PT, DPT 10/26/2021, 4:25 PM

## 2021-10-26 NOTE — Progress Notes (Signed)
Physical Therapy Session Note  Patient Details  Name: [name] MRN: [id number] Date of Birth: 12/27/1931  Today's Date: 10/26/2021 PT Individual Time: 0174-9449 PT Individual Time Calculation (min): 53 min   Short Term Goals: Week 1:  PT Short Term Goal 1 (Week 1): Pt will transfer sup to sit w/ min A. PT Short Term Goal 2 (Week 1): Pt will transfer sit to stand w/ min A PT Short Term Goal 3 (Week 1): Pt will amb w/ least restrictive device x 15' w/ min A.   Skilled Therapeutic Interventions/Progress Updates:   Pt received supine in bed and agreeable to PT. Supine>sit transfer with min assist and on the L side of bed with use of bed rail. Stand pivot transfer to [facility] with min assist and HHA on the LUE. Pt transported to day room in [facility]. Stand pivot transfer to Nustep with min-mod assist for safety for improved UE placement. Nustep reciprocal movement training 7 min +3 min with therapeutic rest break between bout. And min cues for full ROM. Stand pivot transfer to [facility] with min assist and LUE supported on WC. Patient returned to room and left sitting in [facility] with call bell in reach and all needs met.          Therapy Documentation Precautions:  Precautions Precautions: Fall Precaution Comments: High fall risk Required Braces or Orthoses: Sling Restrictions Weight Bearing Restrictions: Yes RUE Weight Bearing: Non weight bearing RLE Weight Bearing: Weight bearing as tolerated LLE Weight Bearing: Weight bearing as tolerated Other Position/Activity Restrictions: Sling at all times except for during ADLs; keep elbow close to body when not in sling. No PROM/AROM at R shoulder for 2-3 weeks  Vital Signs: Therapy Vitals Temp: 98.1 F (36.7 C) Temp Source: Oral Pulse Rate: 84 Resp: 16 BP: (!) 116/44 Patient Position (if appropriate): Sitting Oxygen Therapy SpO2: 100 % O2 Device: Room Air Pain: denies   Therapy/Group: Individual Therapy  Lorie Phenix 10/26/2021, 5:37 PM

## 2021-10-26 NOTE — Progress Notes (Signed)
Occupational Therapy Weekly Progress Note  Patient Details  Name: [name] MRN: [id number] Date of Birth: 1932/06/07  Beginning of progress report period: October 20, 2021 End of progress report period: October 27, 2021  Today's Date: 10/27/2021 OT Individual Time: 0076-2263 OT Individual Time Calculation (min): 56 min    Patient has met 3 of 4 short term goals.  Patient is making steady progress towards OT goals. She is consistently able to sit<>stand and transfer to [facility] or wc with min HHA. She continues to have some fear of falling affecting her ability to try to assist with BADL tasks in standing such as clothing management. Pt continues to need mod-max A for bathing and dressing tasks due to painful R UE, painful R LE, balance, and fear of falling. I anticipate she will require at least minimal assistance with BADL Tasks at discharge. Continue current POC.  Patient continues to demonstrate the following deficits: muscle weakness, decreased cardiorespiratoy endurance, and decreased sitting balance, decreased standing balance, decreased postural control, decreased balance strategies, and difficulty maintaining precautions and therefore will continue to benefit from skilled OT intervention to enhance overall performance with BADL and Reduce care partner burden.  Patient progressing toward long term goals..  Continue plan of care.  OT Short Term Goals Week 1:  OT Short Term Goal 1 (Week 1): Patient will complete 1 step of UB dressing task OT Short Term Goal 1 - Progress (Week 1): Met OT Short Term Goal 2 (Week 1): Patient will complete 1 step of LB dressing task using LRAD OT Short Term Goal 2 - Progress (Week 1): Progressing toward goal OT Short Term Goal 3 (Week 1): Patient will tolerate standing for 2 minutes in preparation for BADL Task OT Short Term Goal 3 - Progress (Week 1): Met OT Short Term Goal 4 - Progress (Week 1): Met Week 2:  OT Short Term Goal 1 (Week 2): LTG=STG 2/2  ELOS  Skilled Therapeutic Interventions/Progress Updates:    Patient greeted semi-reclined in bed and agreeable to OT treatment session. Patients two daughters present for family education. Pt on bed pan and had voided bladder with small BM. Total A for peri-care. Pt completed bed mobility with HOB elevated and min A. LB dressing at EOB with max A and cues for anterior weight shift. OT discussed BADL modifications with bathing/dressing tasks and sling management. Pt then completed sit<>stand with hemi walker and min A, then max A to pull up pants and min A to pivot to wc and posterior lean. OT discussed at length OT goals and need for minimal assistance from caregiver at dc. Conversation focused around burden of care on daughter who uses a RW herself and cannot provide physical assistance safely to her mother. Although patient is making progress in therapy, given her inability to use her R UE and fear of falling, she will still need at least minimal assistance at dc. Pt left seated in wc at end of session with daughters present and needs met.   Therapy Documentation Precautions:  Precautions Precautions: Fall Precaution Comments: High fall risk Required Braces or Orthoses: Sling Restrictions Weight Bearing Restrictions: Yes RUE Weight Bearing: Non weight bearing RLE Weight Bearing: Weight bearing as tolerated LLE Weight Bearing: Weight bearing as tolerated Other Position/Activity Restrictions: Sling at all times except for during ADLs; keep elbow close to body when not in sling. No PROM/AROM at R shoulder for 2-3 weeks Pain: Pain Assessment Pain Scale: 0-10 Pain Score: 5    Therapy/Group: Individual  Therapy  [name] 10/26/2021, 9:30 PM  

## 2021-10-26 NOTE — Progress Notes (Signed)
Occupational Therapy Session Note  Patient Details  Name: [name] MRN: [id number] Date of Birth: [birth year]  Today's Date: 10/26/2021 OT Individual Time: 0626-9485 OT Individual Time Calculation (min): 58 min   Short Term Goals: Week 2:     Skilled Therapeutic Interventions/Progress Updates:    Pt greeted semi-reclined in bed and agreeable to OT treatment session. Pt with more hip pain this morning but agreeable to get up. Pt slow to process commands and slow to initiate this morning. Pt stated it was because of the pain, but noted slower processing throughout BADL tasks. Pt needed max A to get to sitting EOB due to pain. Pt continues to be very fearful of falling and needed max A to get feet on the floor. Pt declined need to use the bathroom. Worked on sit<>stands and standing balance/endurance. Pt needed min A to stand, then with encouragement, was able to wash peri-area and buttocks with min/mod A for standing balance and multiple seated rest breaks to get this done. Addressed anterior weight shift with reaching forward to try to pull up pants. UB bathing/dressing completed at EOB with OT assist to doff R sling. Pt was then able to wash under R arm and chest/abdomen with cues. Pt able to help lift R UE using L UE to help get R sweater sleeve on, then was confused and did not realize she did not put her L arm in the sleeve. Pt able to snap 3/6 snaps on sweater. Pt reported fatigue and requested to return to bed. Pt needed max A to get back to bed. Pt left semi-reclined in bed with bed alarm on, call bell in reach, and needs met.   Therapy Documentation Precautions:  Precautions Precautions: Fall Precaution Comments: High fall risk Required Braces or Orthoses: Sling Restrictions Weight Bearing Restrictions: Yes RUE Weight Bearing: Non weight bearing RLE Weight Bearing: Weight bearing as tolerated LLE Weight Bearing: Weight bearing as tolerated Other Position/Activity Restrictions:  Sling at all times except for during ADLs; keep elbow close to body when not in sling. No PROM/AROM at R shoulder for 2-3 weeks Pain: Pain Assessment Pain Scale: 0-10 Pain Score: 8  Pain Location: Hip Pain Orientation: Right Pain Descriptors / Indicators: Discomfort Pain Intervention(s): Rest, repositioned    Therapy/Group: Individual Therapy  [name] 10/26/2021, 9:29 AM

## 2021-10-26 NOTE — Progress Notes (Signed)
PROGRESS NOTE   Subjective/Complaints: Moving bowels. Stool soft/mushy this morning. Having sl stomach cramps when I came in room.   ROS: Patient denies fever, rash, sore throat, blurred vision, nausea, vomiting, diarrhea, cough, shortness of breath or chest pain,   headache, or mood change.    Objective:   No results found. No results for input(s): WBC, HGB, HCT, PLT in the last 72 hours.  Recent Labs    10/26/21 0523  NA 127*  K 5.6*  CL 100  CO2 19*  GLUCOSE 91  BUN 36*  CREATININE 1.42*  CALCIUM 7.8*    Intake/Output Summary (Last 24 hours) at 10/26/2021 3005 Last data filed at 10/26/2021 1102 Gross per 24 hour  Intake 720 ml  Output --  Net 720 ml     Pressure Injury 10/23/21 Elbow Posterior;Right Stage 2 -  Partial thickness loss of dermis presenting as a shallow open injury with a red, pink wound bed without slough. (Active)  10/23/21 Dorthula Nettles, RN3, [place]) [phone]  Location: Elbow  Location Orientation: Posterior;Right  Staging: Stage 2 -  Partial thickness loss of dermis presenting as a shallow open injury with a red, pink wound bed without slough.  Wound Description (Comments):   Present on Admission:     Physical Exam: Vital Signs Blood pressure (!) 153/57, pulse 91, temperature 97.6 F (36.4 C), resp. rate 16, height 4\' 8"  (1.422 m), weight 48.3 kg, SpO2 99 %. Constitutional: No distress . Vital signs reviewed. HEENT: NCAT, EOMI, oral membranes moist Neck: supple Cardiovascular: RRR without murmur. No JVD    Respiratory/Chest: CTA Bilaterally without wheezes or rales. Normal effort    GI/Abdomen: BS +, non-tender, non-distended Ext: no clubbing, cyanosis, or edema Psych: pleasant and cooperative  Skin: right elbow wound present. no evidence of rash, mult facial ecchymoses slowly improving, steristrips over right eyebrow  Neurologic: very alert. Appropriate. Cranial nerves II through XII  intact, motor strength is 5/5 in left deltoid, bicep, tricep, grip, 4/5 Right and 5/5 Left  Hip flexor, knee extensors, ankle dorsiflexor and plantar flexor Sensory exam normal sensation to light touch  in bilateral upper and lower extremities Musculoskeletal: RUE in sling has good grip , color and temp. RUE tr edema   Assessment/Plan: 1. Functional deficits which require 3+ hours per day of interdisciplinary therapy in a comprehensive inpatient rehab setting. Physiatrist is providing close team supervision and 24 hour management of active medical problems listed below. Physiatrist and rehab team continue to assess barriers to discharge/monitor patient progress toward functional and medical goals  Care Tool:  Bathing    Body parts bathed by patient: Chest, Abdomen, Face   Body parts bathed by helper: Right arm, Left arm, Front perineal area, Right upper leg, Buttocks, Left upper leg, Right lower leg, Left lower leg     Bathing assist Assist Level: Maximal Assistance - Patient 24 - 49%     Upper Body Dressing/Undressing Upper body dressing   What is the patient wearing?: Button up shirt    Upper body assist Assist Level: Total Assistance - Patient < 25%    Lower Body Dressing/Undressing Lower body dressing      What is the  patient wearing?: Incontinence brief, Pants     Lower body assist Assist for lower body dressing: Total Assistance - Patient < 25%     Toileting Toileting    Toileting assist Assist for toileting: Total Assistance - Patient < 25%     Transfers Chair/bed transfer  Transfers assist     Chair/bed transfer assist level: Minimal Assistance - Patient > 75% Chair/bed transfer assistive device: Armrests, Cane   Locomotion Ambulation   Ambulation assist      Assist level: Minimal Assistance - Patient > 75% Assistive device: Cane-quad Max distance: 58ft   Walk 10 feet activity   Assist     Assist level: Moderate Assistance - Patient - 50  - 74% Assistive device: Hand held assist   Walk 50 feet activity   Assist Walk 50 feet with 2 turns activity did not occur: Safety/medical concerns         Walk 150 feet activity   Assist Walk 150 feet activity did not occur: Safety/medical concerns         Walk 10 feet on uneven surface  activity   Assist Walk 10 feet on uneven surfaces activity did not occur: Safety/medical concerns         Wheelchair     Assist Is the patient using a wheelchair?: Yes Type of Wheelchair: Manual    Wheelchair assist level: Dependent - Patient 0% Max wheelchair distance: 150'    Wheelchair 50 feet with 2 turns activity    Assist        Assist Level: Dependent - Patient 0%   Wheelchair 150 feet activity     Assist      Assist Level: Dependent - Patient 0%   Blood pressure (!) 153/57, pulse 91, temperature 97.6 F (36.4 C), resp. rate 16, height 4\' 8"  (1.422 m), weight 48.3 kg, SpO2 99 %.  Medical Problem List and Plan: 1. Functional deficits secondary to poly trauma, pelvic and humeral fracture             -patient may Not shower             -ELOS/Goals: 10-12d sup to min A  -Continue CIR therapies including PT, OT  2.  Antithrombotics: -DVT/anticoagulation:  Mechanical: Sequential compression devices, below knee Bilateral lower extremities.              -antiplatelet therapy: N/A 3. Pain Management: Hydrocodone 3 times daily with as needed oxycodone for breakthrough pain 4. Mood: LCSW to follow for evaluation and support             -antipsychotic agents: N/AA 5. Neuropsych: This patient is capable of making decisions on her own behalf. 6. Skin/Wound Care: Routine pressure-relief measures. 7. Fluids/Electrolytes/Nutrition: Monitor I/O. Check CMET in am. 8. PAF: Off eliquis. Monitor Heart rate TID on coreg BID. 9. HTN: Monitor BP TID-on Norvasc, Coreg and lisinopril Vitals:   10/25/21 1956 10/26/21 0423  BP: (!) 123/52 (!) 153/57  Pulse: 80 91   Resp: 16 16  Temp: 97.8 F (36.6 C) 97.6 F (36.4 C)  SpO2: 100% 99%    -lisinopril held d/t hyperkalemia -11.30 increased norvasc to 10mg --observe 10. Hyperkalemia: recurrent--lokelma x 1 11. PMR: Followed by Percival Spanish and has been managed with medrol 4 mg daily.  12. Right humerus fracture: NWB RUE with sling at all times.  --May doff sling for ADLs with right forearm close to body. 13. Urinary retention: foley removed  -seems to emptying with low residuals 14. Constipation: Last  BM documented on 11/21 as smal and red. Only smears since then --soft/mushy stool this morning -dc'ed miralax -continue senna-s 1 tab at hs, diet, enc liquids   15. Nausea: Continue zofran BID. Marland Kitchen   16.  Leukocytosis , afeb on medrol for PMR 17. AKI: Cr worsened to 1.42 12/1  -sodium down to 127  -resume IV, use NS, run at night  -recheck in am    LOS: 8 days A FACE TO [place] 10/26/2021, 8:52 AM

## 2021-10-27 LAB — BASIC METABOLIC PANEL WITH GFR
Anion gap: 5 (ref 5–15)
BUN: 31 mg/dL — ABNORMAL HIGH (ref 8–23)
CO2: 18 mmol/L — ABNORMAL LOW (ref 22–32)
Calcium: 8 mg/dL — ABNORMAL LOW (ref 8.9–10.3)
Chloride: 105 mmol/L (ref 98–111)
Creatinine, Ser: 1.02 mg/dL — ABNORMAL HIGH (ref 0.44–1.00)
GFR, Estimated: 53 mL/min — ABNORMAL LOW
Glucose, Bld: 86 mg/dL (ref 70–99)
Potassium: 5.2 mmol/L — ABNORMAL HIGH (ref 3.5–5.1)
Sodium: 128 mmol/L — ABNORMAL LOW (ref 135–145)

## 2021-10-27 LAB — GLUCOSE, CAPILLARY: Glucose-Capillary: 101 mg/dL — ABNORMAL HIGH (ref 70–99)

## 2021-10-27 MED ORDER — SODIUM ZIRCONIUM CYCLOSILICATE 5 G PO PACK
5.0000 g | PACK | Freq: Once | ORAL | Status: AC
  Administered 2021-10-27: 5 g via ORAL
  Filled 2021-10-27: qty 1

## 2021-10-27 NOTE — Progress Notes (Signed)
Patient ID: [name], female   DOB: 11/18/1932, 87 y.o.   MRN: [id number]  SW received updates from therapy team that pt will d/c to SNF. SW understanding is that patient agrees. SW will follow-up with pt and her daughter [name] to discuss further.  SW spoke with pt dtr [name] to discuss above. Confirms this is the plan. SW emailed link for [email or web address] SNF locations for review. SW explained SNF placement process. SW asked for follow-up on preference.   SW met with pt in room to discuss above. Pt is in agreement. SW discussed short-term rehab for SNF placement. SW provided pt with SNF list as well for review. She is relying on her children to consider best location. She has indicated she does not want [facility]. Pt aware SW will follow-up once there is a SNF in place.   SW sent out SNF referral. SW waiting on NCPASRR#.  Loralee Pacas, MSW, [place] Office: [phone] Cell: [phone] Fax: [phone]

## 2021-10-27 NOTE — Progress Notes (Signed)
Occupational Therapy Session Note  Patient Details  Name: [name] MRN: [id number] Date of Birth: 01-Feb-1932  Today's Date: 10/27/2021 OT Individual Time: 1305-1330 OT Individual Time Calculation (min): 25 min    Short Term Goals: Week 2:  OT Short Term Goal 1 (Week 2): LTG=STG 2/2 ELOS  Skilled Therapeutic Interventions/Progress Updates:  Skilled OT intervention completed with focus on AE education. Pt received upright in bed, agreeable to session with RN in room administering meds. Pt with complaint of faitgue from previous sessions and that she hadn't finished her lunch, with pt agreeable to participate in AE education in order to increase pt's independence with self-care tasks. Educated on long handled sponge, as well as Secondary school teacher, with demonstration provided on utilization of each in different self-care scenarios. Pt reports she has reacher at home but is different than one used in session, with education also provided about where to purchase AE items for increased independence upon d/c. Pt able to return demonstrate using grasping technique onto brief, for simulated donning, however reported not being able to use or attempt threading onto foot at this time due to fatigue despite bed level trial offered. Pt left seated in bed, finishing lunch with bed alarm on, and all needs in reach at end of session.   Therapy Documentation Precautions:  Precautions Precautions: Fall Precaution Comments: High fall risk Required Braces or Orthoses: Sling Restrictions Weight Bearing Restrictions: Yes RUE Weight Bearing: Non weight bearing RLE Weight Bearing: Weight bearing as tolerated LLE Weight Bearing: Weight bearing as tolerated Other Position/Activity Restrictions: Sling at all times except for during ADLs; keep elbow close to body when not in sling. No PROM/AROM at R shoulder for 2-3 weeks  Pain: No c/o pain    Therapy/Group: Individual Therapy  Fiorella Hanahan E Fischer Halley 10/27/2021, 7:52 AM

## 2021-10-27 NOTE — Plan of Care (Signed)
  Problem: Consults Goal: RH GENERAL PATIENT EDUCATION Description: See Patient Education module for education specifics. Outcome: Progressing Goal: Skin Care Protocol Initiated - if Braden Score 18 or less Description: If consults are not indicated, leave blank or document N/A Outcome: Progressing   Problem: RH BOWEL ELIMINATION Goal: RH STG MANAGE BOWEL WITH ASSISTANCE Description: STG Manage Bowel with min Assistance. Outcome: Progressing Goal: RH STG MANAGE BOWEL W/MEDICATION W/ASSISTANCE Description: STG Manage Bowel with Medication with min Assistance. Outcome: Progressing   Problem: RH BLADDER ELIMINATION Goal: RH STG MANAGE BLADDER WITH ASSISTANCE Description: STG Manage Bladder With min Assistance Outcome: Progressing Goal: RH STG MANAGE BLADDER WITH MEDICATION WITH ASSISTANCE Description: STG Manage Bladder With Medication With min Assistance. Outcome: Progressing   Problem: RH SKIN INTEGRITY Goal: RH STG MAINTAIN SKIN INTEGRITY WITH ASSISTANCE Description: STG Maintain Skin Integrity With min Assistance. Outcome: Progressing Goal: RH STG ABLE TO PERFORM INCISION/WOUND CARE W/ASSISTANCE Description: STG Able To Perform Incision/Wound Care With min Assistance. Outcome: Progressing   Problem: RH SAFETY Goal: RH STG ADHERE TO SAFETY PRECAUTIONS W/ASSISTANCE/DEVICE Description: STG Adhere to Safety Precautions With Cues and reminders. Outcome: Progressing Goal: RH STG DECREASED RISK OF FALL WITH ASSISTANCE Description: STG Decreased Risk of Fall With min Assistance. Outcome: Progressing   Problem: RH PAIN MANAGEMENT Goal: RH STG PAIN MANAGED AT OR BELOW PT'S PAIN GOAL Description: < 3 on a 0-10 pain scale. Outcome: Progressing   Problem: RH KNOWLEDGE DEFICIT GENERAL Goal: RH STG INCREASE KNOWLEDGE OF SELF CARE AFTER HOSPITALIZATION Description: Patient will demonstrate knowledge of medication/pain management, skin/wound care, weight bearing precautions, and fall  precautions with educational materials and handouts provided by staff independently at discharge. Outcome: Progressing

## 2021-10-27 NOTE — Progress Notes (Addendum)
PROGRESS NOTE   Subjective/Complaints: Moved bowels again this morning. Is a little disappointed that she still needs assistance for mobility, etc. Asked how long it will take to get back to what she was doing before. Is a little depressed over it  ROS: Patient denies fever, rash, sore throat, blurred vision, nausea, vomiting, diarrhea, cough, shortness of breath or chest pain    Objective:   No results found. No results for input(s): WBC, HGB, HCT, PLT in the last 72 hours.  Recent Labs    10/26/21 0523 10/27/21 0614  NA 127* 128*  K 5.6* 5.2*  CL 100 105  CO2 19* 18*  GLUCOSE 91 86  BUN 36* 31*  CREATININE 1.42* 1.02*  CALCIUM 7.8* 8.0*    Intake/Output Summary (Last 24 hours) at 10/27/2021 0836 Last data filed at 10/27/2021 0802 Gross per 24 hour  Intake 555.63 ml  Output 1100 ml  Net -544.37 ml     Pressure Injury 10/23/21 Elbow Posterior;Right Stage 2 -  Partial thickness loss of dermis presenting as a shallow open injury with a red, pink wound bed without slough. (Active)  10/23/21 Dorthula Nettles, RN3, [place]) [phone]  Location: Elbow  Location Orientation: Posterior;Right  Staging: Stage 2 -  Partial thickness loss of dermis presenting as a shallow open injury with a red, pink wound bed without slough.  Wound Description (Comments):   Present on Admission:     Physical Exam: Vital Signs Blood pressure (!) 142/60, pulse 83, temperature 98.6 F (37 C), temperature source Oral, resp. rate 16, height 4\' 8"  (1.422 m), weight 48.3 kg, SpO2 100 %. Constitutional: No distress . Vital signs reviewed. HEENT: NCAT, EOMI, oral membranes moist Neck: supple Cardiovascular: RRR without murmur. No JVD    Respiratory/Chest: CTA Bilaterally without wheezes or rales. Normal effort    GI/Abdomen: BS +, non-tender, non-distended Ext: no clubbing, cyanosis, tr RUE edema Psych: pleasant and cooperative, a little more anxious  this morning  Skin: right elbow wound present. no evidence of rash, mult facial ecchymoses continue to improve, steristrips over right eyebrow  Neurologic: very alert. Appropriate. Cranial nerves II through XII intact, motor strength is 5/5 in left deltoid, bicep, tricep, grip, 4/5 Right and 5/5 Left  Hip flexor, knee extensors, ankle dorsiflexor and plantar flexor Sensory exam normal sensation to light touch  in bilateral upper and lower extremities Musculoskeletal: RUE with good grip , color and temp. RUE tr edema   Assessment/Plan: 1. Functional deficits which require 3+ hours per day of interdisciplinary therapy in a comprehensive inpatient rehab setting. Physiatrist is providing close team supervision and 24 hour management of active medical problems listed below. Physiatrist and rehab team continue to assess barriers to discharge/monitor patient progress toward functional and medical goals  Care Tool:  Bathing    Body parts bathed by patient: Chest, Abdomen, Face   Body parts bathed by helper: Right arm, Left arm, Front perineal area, Right upper leg, Buttocks, Left upper leg, Right lower leg, Left lower leg     Bathing assist Assist Level: Maximal Assistance - Patient 24 - 49%     Upper Body Dressing/Undressing Upper body dressing   What is the  patient wearing?: Button up shirt    Upper body assist Assist Level: Total Assistance - Patient < 25%    Lower Body Dressing/Undressing Lower body dressing      What is the patient wearing?: Incontinence brief, Pants     Lower body assist Assist for lower body dressing: Total Assistance - Patient < 25%     Toileting Toileting    Toileting assist Assist for toileting: Total Assistance - Patient < 25%     Transfers Chair/bed transfer  Transfers assist     Chair/bed transfer assist level: Minimal Assistance - Patient > 75% Chair/bed transfer assistive device: Armrests, Cane   Locomotion Ambulation   Ambulation  assist      Assist level: Minimal Assistance - Patient > 75% Assistive device: Cane-quad Max distance: 22ft   Walk 10 feet activity   Assist     Assist level: Moderate Assistance - Patient - 50 - 74% Assistive device: Hand held assist   Walk 50 feet activity   Assist Walk 50 feet with 2 turns activity did not occur: Safety/medical concerns         Walk 150 feet activity   Assist Walk 150 feet activity did not occur: Safety/medical concerns         Walk 10 feet on uneven surface  activity   Assist Walk 10 feet on uneven surfaces activity did not occur: Safety/medical concerns         Wheelchair     Assist Is the patient using a wheelchair?: Yes Type of Wheelchair: Manual    Wheelchair assist level: Dependent - Patient 0% Max wheelchair distance: 150'    Wheelchair 50 feet with 2 turns activity    Assist        Assist Level: Dependent - Patient 0%   Wheelchair 150 feet activity     Assist      Assist Level: Dependent - Patient 0%   Blood pressure (!) 142/60, pulse 83, temperature 98.6 F (37 C), temperature source Oral, resp. rate 16, height 4\' 8"  (1.422 m), weight 48.3 kg, SpO2 100 %.  Medical Problem List and Plan: 1. Functional deficits secondary to poly trauma, pelvic and humeral fracture             -patient may shower             -ELOS/Goals: 10-12d sup to min A  -Continue CIR therapies including PT, OT , family ed  -discussed with her the fact that she HAS made a lot of progress and that it will take several weeks after being home before she feels more like her "old self."  2.  Antithrombotics: -DVT/anticoagulation:  Mechanical: Sequential compression devices, below knee Bilateral lower extremities.              -antiplatelet therapy: N/A 3. Pain Management: Hydrocodone 3 times daily with as needed oxycodone for breakthrough pain 4. Mood: LCSW to follow for evaluation and support             -antipsychotic agents:  N/AA 5. Neuropsych: This patient is capable of making decisions on her own behalf. 6. Skin/Wound Care: Routine pressure-relief measures. 7. Fluids/Electrolytes/Nutrition: Monitor I/O. Check CMET in am. 8. PAF: Off eliquis. Monitor Heart rate TID on coreg BID. 9. HTN: Monitor BP TID-on Norvasc, Coreg and lisinopril Vitals:   10/26/21 1948 10/27/21 0413  BP: (!) 133/56 (!) 142/60  Pulse: 89 83  Resp: 15 16  Temp: 97.9 F (36.6 C) 98.6 F (37 C)  SpO2: 99% 100%    -lisinopril held d/t hyperkalemia -11/30 increased norvasc to 10mg  with improved bp 10. Hyperkalemia: improved to 5.2 today  -will give another small dose of lokelma today 11. PMR: Followed by Percival Spanish and has been managed with medrol 4 mg daily.  12. Right humerus fracture: NWB RUE with sling at all times.  --May doff sling for ADLs with right forearm close to body. 13. Urinary retention: foley removed  -seems to emptying with low residuals 14. Constipation: Last BM documented on 11/21 as smal and red. Only smears since then --formed stool 12/2 -dc'ed miralax -continue senna-s 1 tab at hs, diet, enc liquids   15. Nausea: Continue zofran BID. Marland Kitchen   16.  Leukocytosis , afeb on medrol for PMR 17. AKI/hyponatremia: BUN/Cr worsened to 36/1.42 12/1-->31/1.02  12/2-sodium up from 127 to 128     -continue IV NS  QHS     -recheck BMET in am 12/3    LOS: 9 days A FACE TO [place] 10/27/2021, 8:36 AM

## 2021-10-27 NOTE — Progress Notes (Signed)
Occupational Therapy Session Note  Patient Details  Name: [name] MRN: [id number] Date of Birth: 10-02-1932  Today's Date: 10/27/2021 OT Individual Time: 1400-1415 OT Individual Time Calculation (min): 15 min  and Today's Date: 10/27/2021 OT Missed Time: 75 Minutes Missed Time Reason: Patient fatigue  Short Term Goals: Week 2:  OT Short Term Goal 1 (Week 2): LTG=STG 2/2 ELOS  Skilled Therapeutic Interventions/Progress Updates:    Patient greeted semi-reclined in bed. Pt reported just finishing with another treatment session and wanted to rest. Pt agreeable to R hand exercises for edema management. OT then assisted pt with rolling to pull shirt down with min A and placed bed in trendelenburg to boost up into better position. Pt left semi-reclined in bed with UE's supported, call bell in reach, and needs met.   Therapy Documentation Precautions:  Precautions Precautions: Fall Precaution Comments: High fall risk Required Braces or Orthoses: Sling Restrictions Weight Bearing Restrictions: Yes RUE Weight Bearing: Non weight bearing RLE Weight Bearing: Weight bearing as tolerated LLE Weight Bearing: Weight bearing as tolerated Other Position/Activity Restrictions: Sling at all times except for during ADLs; keep elbow close to body when not in sling. No PROM/AROM at R shoulder for 2-3 weeks General: General OT Amount of Missed Time: 45 Minutes Pain:  Denies pain at this time, reports fatigue   Therapy/Group: Individual Therapy  [name] 10/27/2021, 2:30 PM

## 2021-10-27 NOTE — Progress Notes (Signed)
Physical Therapy Weekly Progress Note  Patient Details  Name: [name] MRN: [id number] Date of Birth: [birth year]  Beginning of progress report period: October 20, 2021 End of progress report period: October 27, 2021  Today's Date: 10/27/2021 PT Individual Time: 1005-1058 PT Individual Time Calculation (min): 53 min   Patient has met 3 of 3 short term goals. Pt is progressing very well toward mobility goals, improving independence with bed mobility, balance, transfers, and ambulation. Pt consistently mobilizing at minA level and ambulating up to 90' with L lofstrand crutch. Pt had family education this date and will continue to benefit from plan of care to prepare for safe discharge.  Patient continues to demonstrate the following deficits muscle weakness, decreased cardiorespiratoy endurance, and decreased sitting balance, decreased standing balance, and decreased balance strategies and therefore will continue to benefit from skilled PT intervention to increase functional independence with mobility.  Patient progressing toward long term goals..  Continue plan of care.  PT Short Term Goals Week 1:  PT Short Term Goal 1 (Week 1): Pt will transfer sup to sit w/ min A. PT Short Term Goal 1 - Progress (Week 1): Met PT Short Term Goal 2 (Week 1): Pt will transfer sit to stand w/ min A PT Short Term Goal 2 - Progress (Week 1): Met PT Short Term Goal 3 (Week 1): Pt will amb w/ least restrictive device x 15' w/ min A. PT Short Term Goal 3 - Progress (Week 1): Met Week 2:  PT Short Term Goal 1 (Week 2): STGs=LTGs  Skilled Therapeutic Interventions/Progress Updates:  Ambulation/gait training;Neuromuscular re-education;Stair training;UE/LE Strength taining/ROM;Wheelchair propulsion/positioning;Discharge planning;Therapeutic Activities;UE/LE Coordination activities;Functional mobility training;Patient/family education;Therapeutic Exercise;Community reintegration;DME/adaptive equipment  instruction;Psychosocial support;Balance/vestibular training;Pain management;Skin care/wound management;Cognitive remediation/compensation;Disease management/prevention;Splinting/orthotics;Visual/perceptual remediation/compensation   Pt received seated in WC and agrees to therapy. No complaint of pain. Daughters present for family education. PT provides update on pt's functional status and PT recommendations for 24/7 supervision following discharge. Daughters understand and feel that it will be best for pt to transition to SNF rather than home due to lack of support at this time, considering pt's functional needs and risk for falls. Pt also agreeable.  WC transport to gym for time management. Pt performs stand step transfer to Nustep without AD and with minA at trunk to facilitate optimal posture and sequencing of transfer. Pt completes Nustep for strength and endurance training. Pt completes 2x6:00 at workload of 4 with average steps per minute ~40. Pt requires extended seated rest break between bouts and after completing full activity.  Pt performs sit to stand with L lofstrand crutch and minA to facilitate anterior weight transition. Pt ambulates x90' with minA and improving stride length. Pt provides verbal and tactile cues for upright gaze as well as trunk and hip extension to decrease risk for falls, as well as cues on placement of lofstrand crutch for optimal use. Pt voices significant fatigue following ambulation.  Pt left seated in WC with all needs within reach  Therapy Documentation Precautions:  Precautions Precautions: Fall Precaution Comments: High fall risk Required Braces or Orthoses: Sling Restrictions Weight Bearing Restrictions: Yes RUE Weight Bearing: Non weight bearing RLE Weight Bearing: Weight bearing as tolerated LLE Weight Bearing: Weight bearing as tolerated Other Position/Activity Restrictions: Sling at all times except for during ADLs; keep elbow close to body when  not in sling. No PROM/AROM at R shoulder for 2-3 weeks  Therapy/Group: Individual Therapy  Breck Coons, PT, DPT 10/27/2021, 4:09 PM

## 2021-10-27 NOTE — NC FL2 (Signed)
[place] MEDICAID FL2 LEVEL OF CARE SCREENING TOOL     IDENTIFICATION  Patient Name: [name] Birthdate: 04-20-1932 Sex: female Admission Date (Current Location): 10/18/2021  [facility] and [place] Number:  Herbalist and Address:  [facility], [place] [address] [place], [place] [address]      Provider Number: [id number]  Attending Physician Name and Address:  Meredith Staggers, MD  Relative Name and Phone Number:  Karel Jarvis (dtr) [phone]    Current Level of Care: Hospital Recommended Level of Care: [place] Prior Approval Number:    Date Approved/Denied:   PASRR Number:    Discharge Plan: SNF    Current Diagnoses: Patient Active Problem List   Diagnosis Date Noted   Acute lower GI bleeding 10/12/2021   Dysphagia 10/12/2021   Acute blood loss anemia 10/12/2021   Leukocytosis 10/12/2021   Pelvic fracture ([place]) 10/06/2021   Old age 87/08/2021   Fall at home, initial encounter 10/01/2021   Fall 10/01/2021   Closed fracture of right pubis ([place])    PAF (paroxysmal atrial fibrillation) ([place]) 08/31/2020   Secondary hypercoagulable state ([place]) 08/31/2020   Hyponatremia 03/28/2019   AKI (acute kidney injury) ([place])    Cardiomyopathy ([place]) 01/25/2014   Mitral valve disorder 01/25/2014   Combined hyperlipidemia 01/25/2014   Biventricular implantable cardioverter-defibrillator in situ 02/77/4128   Chronic systolic heart failure ([place]) 04/24/2011   Hypertension, benign 04/24/2011   Dyslipidemia 04/24/2011    Orientation RESPIRATION BLADDER Height & Weight     Self, Time, Situation, Place  Normal Continent Weight: 106 lb 7.7 oz (48.3 kg) Height:  4\' 8"  (142.2 cm)  BEHAVIORAL SYMPTOMS/MOOD NEUROLOGICAL BOWEL NUTRITION STATUS      Continent Diet  AMBULATORY STATUS COMMUNICATION OF NEEDS Skin   Limited Assist Verbally Normal, Other (Comment) (1) Facial bruising and left steri strip over left eye; 2) staples to R  lower leg; 3) stage 2 to elbow with foam dressing; 4) barrier cream being used on bottom)                       Personal Care Assistance Level of Assistance  Bathing, Dressing Bathing Assistance: Limited assistance   Dressing Assistance: Limited assistance     Functional Limitations Info             SPECIAL CARE FACTORS FREQUENCY  PT (By licensed PT), OT (By licensed OT)     PT Frequency: 5xs per week OT Frequency: 5xs per week            Contractures Contractures Info: Not present    Additional Factors Info  Code Status, Allergies Code Status Info: Full Allergies Info: Pertussis Vaccines-  Swelling;   Sulfa Antibiotics- Nausea;  Amoxicillin- Low Rash           Current Medications (10/27/2021):  This is the current hospital active medication list Current Facility-Administered Medications  Medication Dose Route Frequency Provider Last Rate Last Admin   0.9 %  sodium chloride infusion   Intravenous Continuous Meredith Staggers, MD 50 mL/hr at 10/26/21 1704 New Bag at 10/26/21 1704   acetaminophen (TYLENOL) tablet 325-650 mg  325-650 mg Oral Q4H PRN Bary Leriche, PA-C   650 mg at 10/21/21 1747   albuterol (PROVENTIL) (2.5 MG/3ML) 0.083% nebulizer solution 2.5 mg  2.5 mg Nebulization Q6H PRN Love, Pamela S, PA-C       alum & mag hydroxide-simeth (MAALOX/MYLANTA) 200-200-20 MG/5ML suspension  30 mL  30 mL Oral Q4H PRN Love, Pamela S, PA-C       amLODipine (NORVASC) tablet 10 mg  10 mg Oral Daily Meredith Staggers, MD   10 mg at 10/27/21 0831   atorvastatin (LIPITOR) tablet 40 mg  40 mg Oral Daily Bary Leriche, PA-C   40 mg at 10/27/21 0831   bisacodyl (DULCOLAX) suppository 10 mg  10 mg Rectal Daily PRN Love, Pamela S, PA-C       carvedilol (COREG) tablet 6.25 mg  6.25 mg Oral BID WC Love, Pamela S, PA-C   6.25 mg at 10/27/21 0831   diphenhydrAMINE (BENADRYL) 12.5 MG/5ML elixir 12.5-25 mg  12.5-25 mg Oral Q6H PRN Love, Pamela S, PA-C        guaiFENesin-dextromethorphan (ROBITUSSIN DM) 100-10 MG/5ML syrup 5-10 mL  5-10 mL Oral Q6H PRN Love, Pamela S, PA-C       hydrALAZINE (APRESOLINE) tablet 25 mg  25 mg Oral Q6H PRN Love, Pamela S, PA-C   25 mg at 10/21/21 1537   HYDROcodone-acetaminophen (NORCO) 10-325 MG per tablet 1 tablet  1 tablet Oral TID Suzan Nailer, PA-C   1 tablet at 10/27/21 1610   levothyroxine (SYNTHROID) tablet 75 mcg  75 mcg Oral Daily Bary Leriche, PA-C   75 mcg at 10/27/21 9604   loperamide (IMODIUM) capsule 2 mg  2 mg Oral PRN Izora Ribas, MD   2 mg at 10/22/21 0030   melatonin tablet 5 mg  5 mg Oral QHS PRN Bary Leriche, PA-C   5 mg at 10/22/21 0012   methylPREDNISolone (MEDROL) tablet 4 mg  4 mg Oral Daily Bary Leriche, PA-C   4 mg at 10/27/21 0831   ondansetron (ZOFRAN) tablet 4 mg  4 mg Oral Q12H Love, Pamela S, PA-C   4 mg at 10/27/21 5409   oxyCODONE (Oxy IR/ROXICODONE) immediate release tablet 5 mg  5 mg Oral Q6H PRN Bary Leriche, PA-C   5 mg at 10/26/21 2045   pantoprazole (PROTONIX) EC tablet 40 mg  40 mg Oral Daily Bary Leriche, PA-C   40 mg at 10/27/21 0831   polyethylene glycol (MIRALAX / GLYCOLAX) packet 17 g  17 g Oral Daily PRN Love, Pamela S, PA-C       prochlorperazine (COMPAZINE) tablet 5-10 mg  5-10 mg Oral Q6H PRN Bary Leriche, PA-C   10 mg at 10/21/21 1537   Or   prochlorperazine (COMPAZINE) injection 5-10 mg  5-10 mg Intramuscular Q6H PRN Love, Pamela S, PA-C       Or   prochlorperazine (COMPAZINE) suppository 12.5 mg  12.5 mg Rectal Q6H PRN Love, Pamela S, PA-C       senna-docusate (Senokot-S) tablet 1 tablet  1 tablet Oral QHS Meredith Staggers, MD   1 tablet at 10/26/21 2038   simethicone (MYLICON) 40 WJ/1.9JY suspension 80 mg  80 mg Oral QID Love, Pamela S, PA-C   80 mg at 10/27/21 0831   simethicone (MYLICON) chewable tablet 80 mg  80 mg Oral QID PRN Bary Leriche, PA-C   80 mg at 10/25/21 1309   sodium phosphate (FLEET) 7-19 GM/118ML enema 1 enema  1 enema Rectal  Once PRN Love, Pamela S, PA-C       sodium zirconium cyclosilicate (LOKELMA) packet 5 g  5 g Oral Once Meredith Staggers, MD       traZODone (DESYREL) tablet 25-50 mg  25-50 mg Oral QHS  PRN Bary Leriche, PA-C   50 mg at 10/24/21 2208     Discharge Medications: Please see discharge summary for a list of discharge medications.  Relevant Imaging Results:  Relevant Lab Results:   Additional Information UN#[id number]  Rana Snare, LCSW

## 2021-10-28 DIAGNOSIS — N179 Acute kidney failure, unspecified: Secondary | ICD-10-CM

## 2021-10-28 DIAGNOSIS — E871 Hypo-osmolality and hyponatremia: Secondary | ICD-10-CM

## 2021-10-28 DIAGNOSIS — E875 Hyperkalemia: Secondary | ICD-10-CM

## 2021-10-28 DIAGNOSIS — M25521 Pain in right elbow: Secondary | ICD-10-CM

## 2021-10-28 DIAGNOSIS — I10 Essential (primary) hypertension: Secondary | ICD-10-CM

## 2021-10-28 LAB — BASIC METABOLIC PANEL
Anion gap: 6 (ref 5–15)
BUN: 24 mg/dL — ABNORMAL HIGH (ref 8–23)
CO2: 18 mmol/L — ABNORMAL LOW (ref 22–32)
Calcium: 8 mg/dL — ABNORMAL LOW (ref 8.9–10.3)
Chloride: 110 mmol/L (ref 98–111)
Creatinine, Ser: 0.99 mg/dL (ref 0.44–1.00)
GFR, Estimated: 55 mL/min — ABNORMAL LOW (ref >60)
Glucose, Bld: 106 mg/dL — ABNORMAL HIGH (ref 70–99)
Potassium: 4.6 mmol/L (ref 3.5–5.1)
Sodium: 134 mmol/L — ABNORMAL LOW (ref 135–145)

## 2021-10-28 NOTE — Plan of Care (Signed)
  Problem: Consults Goal: RH GENERAL PATIENT EDUCATION Description: See Patient Education module for education specifics. Outcome: Progressing Goal: Skin Care Protocol Initiated - if Braden Score 18 or less Description: If consults are not indicated, leave blank or document N/A Outcome: Progressing   Problem: RH BOWEL ELIMINATION Goal: RH STG MANAGE BOWEL WITH ASSISTANCE Description: STG Manage Bowel with min Assistance. Outcome: Progressing Goal: RH STG MANAGE BOWEL W/MEDICATION W/ASSISTANCE Description: STG Manage Bowel with Medication with min Assistance. Outcome: Progressing   Problem: RH BLADDER ELIMINATION Goal: RH STG MANAGE BLADDER WITH ASSISTANCE Description: STG Manage Bladder With min Assistance Outcome: Progressing Goal: RH STG MANAGE BLADDER WITH MEDICATION WITH ASSISTANCE Description: STG Manage Bladder With Medication With min Assistance. Outcome: Progressing   Problem: RH SKIN INTEGRITY Goal: RH STG MAINTAIN SKIN INTEGRITY WITH ASSISTANCE Description: STG Maintain Skin Integrity With min Assistance. Outcome: Progressing Goal: RH STG ABLE TO PERFORM INCISION/WOUND CARE W/ASSISTANCE Description: STG Able To Perform Incision/Wound Care With min Assistance. Outcome: Progressing   Problem: RH SAFETY Goal: RH STG ADHERE TO SAFETY PRECAUTIONS W/ASSISTANCE/DEVICE Description: STG Adhere to Safety Precautions With Cues and reminders. Outcome: Progressing Goal: RH STG DECREASED RISK OF FALL WITH ASSISTANCE Description: STG Decreased Risk of Fall With min Assistance. Outcome: Progressing   Problem: RH PAIN MANAGEMENT Goal: RH STG PAIN MANAGED AT OR BELOW PT'S PAIN GOAL Description: < 3 on a 0-10 pain scale. Outcome: Progressing   Problem: RH KNOWLEDGE DEFICIT GENERAL Goal: RH STG INCREASE KNOWLEDGE OF SELF CARE AFTER HOSPITALIZATION Description: Patient will demonstrate knowledge of medication/pain management, skin/wound care, weight bearing precautions, and fall  precautions with educational materials and handouts provided by staff independently at discharge. Outcome: Progressing

## 2021-10-28 NOTE — Progress Notes (Signed)
Occupational Therapy Session Note  Patient Details  Name: [name] MRN: [id number] Date of Birth: 06-28-1932  Today's Date: 10/28/2021 OT Individual Time: 1100-1200 OT Individual Time Calculation (min): 60 min    Short Term Goals: Week 2:  OT Short Term Goal 1 (Week 2): LTG=STG 2/2 ELOS  Skilled Therapeutic Interventions/Progress Updates:    OT session focused on sit<>stand transfers, activity tolerance, and minimizing fear of falling. Pt received supine in bed agreeable to therapy. Completed supine>sit EOB with min A. With time and encouragement, pt completed sit<>stand with AD with mod A and requiring mod A for static standing balance due posterior lean. OT provided visual cues to promote upright posture while distracting with conversation as slight improvement noted. Transitioned back to sitting and completed forward rocking x10 reps to increase comfort with transition to standing. Pt completed sit<>stand 3x with OT positioned posteriorly and with min A. Pt showed improved static and dynamic standing balance (rocking hips) x2 min each with min-CGA. Pt completed final sit<>stand w/ AD with min A and verbalized increased comfort. At end of session, pt left supine in bed with all needs in reach.   Therapy Documentation Precautions:  Precautions Precautions: Fall Precaution Comments: High fall risk Required Braces or Orthoses: Sling Restrictions Weight Bearing Restrictions: Yes RUE Weight Bearing: Non weight bearing RLE Weight Bearing: Weight bearing as tolerated LLE Weight Bearing: Weight bearing as tolerated Other Position/Activity Restrictions: Sling at all times except for during ADLs; keep elbow close to body when not in sling. No PROM/AROM at R shoulder for 2-3 weeks General:   Vital Signs:  Pain: Pain Assessment Pain Scale: (P) 0-10 Pain Score: (P) 0-No pain ADL: ADL Eating: Set up Grooming: Moderate assistance Upper Body Bathing: Maximal assistance Lower Body  Bathing: Dependent Upper Body Dressing: Dependent Lower Body Dressing: Dependent Toileting: Dependent Toilet Transfer: Moderate assistance Vision   Perception    Praxis   Balance   Exercises:   Other Treatments:     Therapy/Group: Individual Therapy  Duayne Cal 10/28/2021, 11:57 AM

## 2021-10-28 NOTE — Progress Notes (Signed)
PROGRESS NOTE   Subjective/Complaints: Patient seen sitting up in bed this morning.  She states she slept well overnight.  She states she wants to get better quickly.  ROS: Denies CP, SOB, N/V/D  Objective:   No results found. No results for input(s): WBC, HGB, HCT, PLT in the last 72 hours.  Recent Labs    10/27/21 0614 10/28/21 0659  NA 128* 134*  K 5.2* 4.6  CL 105 110  CO2 18* 18*  GLUCOSE 86 106*  BUN 31* 24*  CREATININE 1.02* 0.99  CALCIUM 8.0* 8.0*     Intake/Output Summary (Last 24 hours) at 10/28/2021 0948 Last data filed at 10/28/2021 4496 Gross per 24 hour  Intake 352 ml  Output --  Net 352 ml      Pressure Injury 10/23/21 Elbow Posterior;Right Stage 2 -  Partial thickness loss of dermis presenting as a shallow open injury with a red, pink wound bed without slough. (Active)  10/23/21 Dorthula Nettles, RN3, [place]) [phone]  Location: Elbow  Location Orientation: Posterior;Right  Staging: Stage 2 -  Partial thickness loss of dermis presenting as a shallow open injury with a red, pink wound bed without slough.  Wound Description (Comments):   Present on Admission:     Physical Exam: Vital Signs Blood pressure (!) 151/66, pulse 78, temperature 97.6 F (36.4 C), temperature source Oral, resp. rate 18, height 4\' 8"  (1.422 m), weight 50.6 kg, SpO2 99 %. Constitutional: No distress . Vital signs reviewed. HENT: Multiple facial trauma with mild edema Eyes: EOMI. No discharge. Cardiovascular: No JVD.  RRR. Respiratory: Normal effort.  No stridor.  Bilateral clear to auscultation. GI: Non-distended.  BS +. Skin: Warm and dry.  Right elbow with dressing CDI Psych: Normal mood.  Normal behavior. Musc: Right elbow with mild edema and tenderness Neuro: Alert RUE: Elbow limited due to pain, otherwise grossly intact   Assessment/Plan: 1. Functional deficits which require 3+ hours per day of interdisciplinary  therapy in a comprehensive inpatient rehab setting. Physiatrist is providing close team supervision and 24 hour management of active medical problems listed below. Physiatrist and rehab team continue to assess barriers to discharge/monitor patient progress toward functional and medical goals  Care Tool:  Bathing    Body parts bathed by patient: Chest, Abdomen, Face   Body parts bathed by helper: Right arm, Left arm, Front perineal area, Right upper leg, Buttocks, Left upper leg, Right lower leg, Left lower leg     Bathing assist Assist Level: Maximal Assistance - Patient 24 - 49%     Upper Body Dressing/Undressing Upper body dressing   What is the patient wearing?: Button up shirt    Upper body assist Assist Level: Total Assistance - Patient < 25%    Lower Body Dressing/Undressing Lower body dressing      What is the patient wearing?: Incontinence brief, Pants     Lower body assist Assist for lower body dressing: Total Assistance - Patient < 25%     Toileting Toileting    Toileting assist Assist for toileting: Total Assistance - Patient < 25%     Transfers Chair/bed transfer  Transfers assist     Chair/bed transfer  assist level: Minimal Assistance - Patient > 75% Chair/bed transfer assistive device: Armrests, Cane   Locomotion Ambulation   Ambulation assist      Assist level: Minimal Assistance - Patient > 75% Assistive device: Cane-quad Max distance: 41ft   Walk 10 feet activity   Assist     Assist level: Moderate Assistance - Patient - 50 - 74% Assistive device: Hand held assist   Walk 50 feet activity   Assist Walk 50 feet with 2 turns activity did not occur: Safety/medical concerns         Walk 150 feet activity   Assist Walk 150 feet activity did not occur: Safety/medical concerns         Walk 10 feet on uneven surface  activity   Assist Walk 10 feet on uneven surfaces activity did not occur: Safety/medical concerns          Wheelchair     Assist Is the patient using a wheelchair?: Yes Type of Wheelchair: Manual    Wheelchair assist level: Dependent - Patient 0% Max wheelchair distance: 150'    Wheelchair 50 feet with 2 turns activity    Assist        Assist Level: Dependent - Patient 0%   Wheelchair 150 feet activity     Assist      Assist Level: Dependent - Patient 0%   Blood pressure (!) 151/66, pulse 78, temperature 97.6 F (36.4 C), temperature source Oral, resp. rate 18, height 4\' 8"  (1.422 m), weight 50.6 kg, SpO2 99 %.  Medical Problem List and Plan: 1. Functional deficits secondary to poly trauma, pelvic and humeral fracture  Continue CIR 2.  Antithrombotics: -DVT/anticoagulation:  Mechanical: Sequential compression devices, below knee Bilateral lower extremities.              -antiplatelet therapy: N/A 3. Pain Management: Hydrocodone 3 times daily with as needed oxycodone for breakthrough pain  Controlled with meds on 12/3 4. Mood: LCSW to follow for evaluation and support             -antipsychotic agents: N/A 5. Neuropsych: This patient is capable of making decisions on her own behalf. 6. Skin/Wound Care: Routine pressure-relief measures. 7. Fluids/Electrolytes/Nutrition: Monitor I/Os.  8. PAF: Off eliquis. Monitor Heart rate TID on coreg BID. 9. HTN: Monitor BP TID-on Norvasc, Coreg and lisinopril Vitals:   10/27/21 1935 10/28/21 0500  BP: (!) 145/53 (!) 151/66  Pulse: 87 78  Resp: 18 18  Temp: 98.1 F (36.7 C) 97.6 F (36.4 C)  SpO2: 100% 99%  lisinopril held d/t hyperkalemia 11/30 increased Norvasc to 10mg  with improved bp ?  Trending up, will consider medication adjustments if persistent 10. Hyperkalemia:   Potassium 4.6 on 12/3 after Lokelma, labs ordered for Monday 11. PMR: Followed by Percival Spanish and has been managed with medrol 4 mg daily.  12. Right humerus fracture: NWB RUE  --May doff sling for ADLs with right forearm close to body. 13.  Urinary retention: foley removed  -seems to emptying with low residuals 14. Constipation:  -continue senna-s 1 tab at hs, diet, enc liquids   Improving 15. Nausea: Continue zofran BID. Marland Kitchen  16.  Leukocytosis: Resolved 17. AKI/hyponatremia:   -continue IV NS  QHS  BUN/creatinine improving on 12/3  Sodium 134 on 12/3, labs ordered for Monday  LOS: 10 days A FACE TO FACE EVALUATION WAS PERFORMED  Ivy Puryear Lorie Phenix 10/28/2021, 9:48 AM

## 2021-10-29 NOTE — Progress Notes (Signed)
Occupational Therapy Session Note  Patient Details  Name: [name] MRN: [id number] Date of Birth: 11-05-1932  Today's Date: 10/29/2021 OT Individual Time: 1000-1055 OT Individual Time Calculation (min): 55 min    Short Term Goals: Week 2:  OT Short Term Goal 1 (Week 2): LTG=STG 2/2 ELOS  Skilled Therapeutic Interventions/Progress Updates:    Pt resting in bed upon arrival and ready to get OOB. OT intervention with focus on bed mobility, sitting balance, sit<>stand, functional transfers, UB bathing/dressing, and safety awareness. Min A for supine>sit EOB. Sit<>stand from EOB with min A. Stand pivot transfer HHA with CGA and max verbal cues for sequencing. Min A for UB bathing. Pt required mod A for doffing/donning button up shirt. Pt able to button shirt without assistance but requires assistance threading UE into sleeves and doffing/donning RUE sling. Pt requires more then a reasonable amount of time to complete tasks. Pt remained seated in w/c with belt alarm activated. All needs within reach.  Therapy Documentation Precautions:  Precautions Precautions: Fall Precaution Comments: High fall risk Required Braces or Orthoses: Sling Restrictions Weight Bearing Restrictions: Yes RUE Weight Bearing: Non weight bearing RLE Weight Bearing: Weight bearing as tolerated LLE Weight Bearing: Weight bearing as tolerated Other Position/Activity Restrictions: Sling at all times except for during ADLs; keep elbow close to body when not in sling. No PROM/AROM at R shoulder for 2-3 weeks  Pain: Pain Assessment Pain Scale: 0-10 Pain Score: 0-No pain   Therapy/Group: Individual Therapy  Leroy Libman 10/29/2021, 11:04 AM

## 2021-10-29 NOTE — Plan of Care (Signed)
  Problem: Consults Goal: RH GENERAL PATIENT EDUCATION Description: See Patient Education module for education specifics. Outcome: Progressing Goal: Skin Care Protocol Initiated - if Braden Score 18 or less Description: If consults are not indicated, leave blank or document N/A Outcome: Progressing   Problem: RH BOWEL ELIMINATION Goal: RH STG MANAGE BOWEL WITH ASSISTANCE Description: STG Manage Bowel with min Assistance. Outcome: Progressing Goal: RH STG MANAGE BOWEL W/MEDICATION W/ASSISTANCE Description: STG Manage Bowel with Medication with min Assistance. Outcome: Progressing   Problem: RH BLADDER ELIMINATION Goal: RH STG MANAGE BLADDER WITH ASSISTANCE Description: STG Manage Bladder With min Assistance Outcome: Progressing Goal: RH STG MANAGE BLADDER WITH MEDICATION WITH ASSISTANCE Description: STG Manage Bladder With Medication With min Assistance. Outcome: Progressing   Problem: RH SKIN INTEGRITY Goal: RH STG MAINTAIN SKIN INTEGRITY WITH ASSISTANCE Description: STG Maintain Skin Integrity With min Assistance. Outcome: Progressing Goal: RH STG ABLE TO PERFORM INCISION/WOUND CARE W/ASSISTANCE Description: STG Able To Perform Incision/Wound Care With min Assistance. Outcome: Progressing   Problem: RH SAFETY Goal: RH STG ADHERE TO SAFETY PRECAUTIONS W/ASSISTANCE/DEVICE Description: STG Adhere to Safety Precautions With Cues and reminders. Outcome: Progressing Goal: RH STG DECREASED RISK OF FALL WITH ASSISTANCE Description: STG Decreased Risk of Fall With min Assistance. Outcome: Progressing   Problem: RH PAIN MANAGEMENT Goal: RH STG PAIN MANAGED AT OR BELOW PT'S PAIN GOAL Description: < 3 on a 0-10 pain scale. Outcome: Progressing   Problem: RH KNOWLEDGE DEFICIT GENERAL Goal: RH STG INCREASE KNOWLEDGE OF SELF CARE AFTER HOSPITALIZATION Description: Patient will demonstrate knowledge of medication/pain management, skin/wound care, weight bearing precautions, and fall  precautions with educational materials and handouts provided by staff independently at discharge. Outcome: Progressing

## 2021-10-29 NOTE — Progress Notes (Addendum)
PROGRESS NOTE   Subjective/Complaints: Patient seen sitting up in bed this morning eating breakfast.  She states she slept well overnight.  She denies complaints.  ROS: Denies CP, SOB, N/V/D  Objective:   No results found. No results for input(s): WBC, HGB, HCT, PLT in the last 72 hours.  Recent Labs    10/27/21 0614 10/28/21 0659  NA 128* 134*  K 5.2* 4.6  CL 105 110  CO2 18* 18*  GLUCOSE 86 106*  BUN 31* 24*  CREATININE 1.02* 0.99  CALCIUM 8.0* 8.0*     Intake/Output Summary (Last 24 hours) at 10/29/2021 1503 Last data filed at 10/29/2021 0830 Gross per 24 hour  Intake 436 ml  Output 400 ml  Net 36 ml      Pressure Injury 10/23/21 Elbow Posterior;Right Stage 2 -  Partial thickness loss of dermis presenting as a shallow open injury with a red, pink wound bed without slough. (Active)  10/23/21 [name], RN3, [place]) [id number]  Location: Elbow  Location Orientation: Posterior;Right  Staging: Stage 2 -  Partial thickness loss of dermis presenting as a shallow open injury with a red, pink wound bed without slough.  Wound Description (Comments):   Present on Admission:     Physical Exam: Vital Signs Blood pressure (!) 120/53, pulse 81, temperature 97.7 F (36.5 C), temperature source Oral, resp. rate 18, height 4\' 8"  (1.422 m), weight 51.4 kg, SpO2 100 %. Constitutional: No distress . Vital signs reviewed. HENT: Facial trauma with hematomas.,  Healing. Eyes: EOMI. No discharge. Cardiovascular: No JVD.  RRR. Respiratory: Normal effort.  No stridor.  Bilateral clear to auscultation. GI: Non-distended.  BS +. Skin: Warm and dry.  Right elbow with dressing CDI. Psych: Normal mood.  Normal behavior. Musc: Right elbow with mild edema and tenderness, stable Neuro: Alert RUE: Elbow limited due to pain, otherwise grossly intact, stable  Assessment/Plan: 1. Functional deficits which require 3+ hours per day of  interdisciplinary therapy in a comprehensive inpatient rehab setting. Physiatrist is providing close team supervision and 24 hour management of active medical problems listed below. Physiatrist and rehab team continue to assess barriers to discharge/monitor patient progress toward functional and medical goals  Care Tool:  Bathing    Body parts bathed by patient: Chest, Abdomen, Face   Body parts bathed by helper: Right arm, Left arm, Front perineal area, Right upper leg, Buttocks, Left upper leg, Right lower leg, Left lower leg     Bathing assist Assist Level: Maximal Assistance - Patient 24 - 49%     Upper Body Dressing/Undressing Upper body dressing   What is the patient wearing?: Button up shirt    Upper body assist Assist Level: Total Assistance - Patient < 25%    Lower Body Dressing/Undressing Lower body dressing      What is the patient wearing?: Incontinence brief, Pants     Lower body assist Assist for lower body dressing: Total Assistance - Patient < 25%     Toileting Toileting    Toileting assist Assist for toileting: Total Assistance - Patient < 25%     Transfers Chair/bed transfer  Transfers assist     Chair/bed transfer assist  level: Minimal Assistance - Patient > 75% Chair/bed transfer assistive device: Armrests, Research officer, political party   Ambulation assist      Assist level: Minimal Assistance - Patient > 75% Assistive device: Cane-quad Max distance: 28ft   Walk 10 feet activity   Assist     Assist level: Moderate Assistance - Patient - 50 - 74% Assistive device: Hand held assist   Walk 50 feet activity   Assist Walk 50 feet with 2 turns activity did not occur: Safety/medical concerns         Walk 150 feet activity   Assist Walk 150 feet activity did not occur: Safety/medical concerns         Walk 10 feet on uneven surface  activity   Assist Walk 10 feet on uneven surfaces activity did not occur:  Safety/medical concerns         Wheelchair     Assist Is the patient using a wheelchair?: Yes Type of Wheelchair: Manual    Wheelchair assist level: Dependent - Patient 0% Max wheelchair distance: 150'    Wheelchair 50 feet with 2 turns activity    Assist        Assist Level: Dependent - Patient 0%   Wheelchair 150 feet activity     Assist      Assist Level: Dependent - Patient 0%   Blood pressure (!) 120/53, pulse 81, temperature 97.7 F (36.5 C), temperature source Oral, resp. rate 18, height 4\' 8"  (1.422 m), weight 51.4 kg, SpO2 100 %.  Medical Problem List and Plan: 1. Functional deficits secondary to poly trauma, pelvic and humeral fracture  Continue CIR 2.  Antithrombotics: -DVT/anticoagulation:  Mechanical: Sequential compression devices, below knee Bilateral lower extremities.              -antiplatelet therapy: N/A 3. Pain Management: Hydrocodone 3 times daily with as needed oxycodone for breakthrough pain  Controlled with meds on 12/4 4. Mood: LCSW to follow for evaluation and support             -antipsychotic agents: N/A 5. Neuropsych: This patient is capable of making decisions on her own behalf. 6. Skin/Wound Care: Routine pressure-relief measures. 7. Fluids/Electrolytes/Nutrition: Monitor I/Os.  8. PAF: Off eliquis. Monitor Heart rate TID on coreg BID. 9. HTN: Monitor BP -on Norvasc, Coreg and lisinopril Vitals:   10/29/21 0515 10/29/21 1320  BP: (!) 160/58 (!) 120/53  Pulse: 81 81  Resp: 18 18  Temp: 98.1 F (36.7 C) 97.7 F (36.5 C)  SpO2: 99% 100%  lisinopril held d/t hyperkalemia 11/30 increased Norvasc to 10mg  with improved bp Slightly labile on 12/4, monitor for trend 10. Hyperkalemia:   Potassium 4.6 on 12/3 after Lokelma, labs ordered for tomorrow 11. PMR: Followed by [name] and has been managed with medrol 4 mg daily.  12. Right humerus fracture: NWB RUE  --May doff sling for ADLs with right forearm close to  body. 13. Urinary retention: foley removed  -seems to emptying with low residuals 14. Constipation:  -continue senna-s 1 tab at hs, diet, enc liquids   Improving 15. Nausea: Continue zofran BID. Marland Kitchen  16.  Leukocytosis: Resolved 17. AKI/hyponatremia:   -continue IV NS  QHS  BUN/creatinine improving on 12/3  Sodium 134 on 12/3, labs ordered for tomorrow  LOS: 11 days A FACE TO FACE EVALUATION WAS PERFORMED  [name] Lorie Phenix 10/29/2021, 3:03 PM

## 2021-10-30 ENCOUNTER — Inpatient Hospital Stay (HOSPITAL_COMMUNITY): Payer: Medicare Other

## 2021-10-30 DIAGNOSIS — L8990 Pressure ulcer of unspecified site, unspecified stage: Secondary | ICD-10-CM | POA: Insufficient documentation

## 2021-10-30 LAB — CBC
HCT: 27.5 % — ABNORMAL LOW (ref 36.0–46.0)
Hemoglobin: 8.7 g/dL — ABNORMAL LOW (ref 12.0–15.0)
MCH: 31.2 pg (ref 26.0–34.0)
MCHC: 31.6 g/dL (ref 30.0–36.0)
MCV: 98.6 fL (ref 80.0–100.0)
Platelets: 222 10*3/uL (ref 150–400)
RBC: 2.79 MIL/uL — ABNORMAL LOW (ref 3.87–5.11)
RDW: 15 % (ref 11.5–15.5)
WBC: 10.9 10*3/uL — ABNORMAL HIGH (ref 4.0–10.5)
nRBC: 0 % (ref 0.0–0.2)

## 2021-10-30 LAB — BASIC METABOLIC PANEL
Anion gap: 6 (ref 5–15)
BUN: 21 mg/dL (ref 8–23)
CO2: 20 mmol/L — ABNORMAL LOW (ref 22–32)
Calcium: 8.2 mg/dL — ABNORMAL LOW (ref 8.9–10.3)
Chloride: 109 mmol/L (ref 98–111)
Creatinine, Ser: 0.8 mg/dL (ref 0.44–1.00)
GFR, Estimated: >60 mL/min (ref >60)
Glucose, Bld: 88 mg/dL (ref 70–99)
Potassium: 4.7 mmol/L (ref 3.5–5.1)
Sodium: 135 mmol/L (ref 135–145)

## 2021-10-30 MED ORDER — HYDROCODONE-ACETAMINOPHEN 10-325 MG PO TABS: 1.0000 | ORAL_TABLET | Freq: Three times a day (TID) | ORAL | 0 refills | Status: AC

## 2021-10-30 MED ORDER — ALBUTEROL SULFATE (2.5 MG/3ML) 0.083% IN NEBU: 2.5000 mg | INHALATION_SOLUTION | Freq: Four times a day (QID) | RESPIRATORY_TRACT | 12 refills | Status: DC | PRN

## 2021-10-30 MED ORDER — SENNOSIDES-DOCUSATE SODIUM 8.6-50 MG PO TABS: 1.0000 | ORAL_TABLET | Freq: Every day | ORAL | Status: AC

## 2021-10-30 MED ORDER — TRAZODONE HCL 50 MG PO TABS: 25.0000 mg | ORAL_TABLET | Freq: Every evening | ORAL | Status: AC | PRN

## 2021-10-30 MED ORDER — SIMETHICONE 40 MG/0.6ML PO SUSP: 80.0000 mg | Freq: Four times a day (QID) | ORAL | 0 refills | Status: AC

## 2021-10-30 MED ORDER — METHYLPREDNISOLONE 4 MG PO TABS: 4.0000 mg | ORAL_TABLET | Freq: Every day | ORAL | Status: AC

## 2021-10-30 MED ORDER — OXYCODONE HCL 5 MG PO TABS: 5.0000 mg | ORAL_TABLET | Freq: Two times a day (BID) | ORAL | 0 refills | Status: DC | PRN

## 2021-10-30 MED ORDER — FLEET ENEMA 7-19 GM/118ML RE ENEM: 1.0000 | ENEMA | Freq: Once | RECTAL | 0 refills | Status: AC | PRN

## 2021-10-30 MED ORDER — AMLODIPINE BESYLATE 10 MG PO TABS: 10.0000 mg | ORAL_TABLET | Freq: Every day | ORAL | Status: DC

## 2021-10-30 MED ORDER — MELATONIN 5 MG PO TABS: 5.0000 mg | ORAL_TABLET | Freq: Every evening | ORAL | 0 refills | Status: AC | PRN

## 2021-10-30 MED ORDER — ACETAMINOPHEN 325 MG PO TABS: 325.0000 mg | ORAL_TABLET | ORAL | Status: DC | PRN

## 2021-10-30 NOTE — Progress Notes (Addendum)
Occupational Therapy Discharge Summary  Patient Details  Name: [name] MRN: [id number] Date of Birth: [birth year]  Today's Date: 10/30/2021 OT Individual Time: 1025-8527 OT Individual Time Calculation (min): 88 min   Pt greeted seated in wc an agreeable to OT treatment session. BADL tasks completed from wc at the sink. Focus on sit<>stands and use of R hand as a stabilizer. Educated further on sling management and badl modifications. Pt with improved anterior weight shift and overall standing balance/tolerance today. See navigator below for further details regarding BADL  performance. OT discussed dc plan and need for continued OT at next venue of care. R hand exercises for edema management. Pt set-up for lunch and left seated in wc with alarm belt on, nursing present to provide meds and needs met.   Patient has met 9 of 9 long term goals due to improved activity tolerance, improved balance, postural control, and ability to compensate for deficits.  Patient to discharge at overall Mod Assist level.  Patient's care partner unavailable to provide the necessary physical assistance at discharge.    Reasons goals not met: n/a  Recommendation:  Patient will benefit from ongoing skilled OT services in skilled nursing facility setting to continue to advance functional skills in the area of BADL, Reduce care partner burden, and functional use of R UE .  Equipment: No equipment provided  Reasons for discharge: treatment goals met and discharge from hospital  Patient/family agrees with progress made and goals achieved: Yes  OT Discharge Precautions/Restrictions  Precautions Precautions: Fall Precaution Comments: High fall risk Required Braces or Orthoses: Sling Restrictions Weight Bearing Restrictions: Yes RUE Weight Bearing: Non weight bearing RLE Weight Bearing: Weight bearing as tolerated LLE Weight Bearing: Weight bearing as tolerated Pain  Denies pain ADL ADL Eating: Set  up Grooming: Setup Upper Body Bathing: Minimal assistance Lower Body Bathing: Moderate assistance Upper Body Dressing: Moderate assistance Where Assessed-Upper Body Dressing: Bed level Lower Body Dressing: Maximal assistance Toileting: Moderate assistance Toilet Transfer: Contact guard Perception  Perception: Within Functional Limits Praxis Praxis: Intact Cognition Overall Cognitive Status: Within Functional Limits for tasks assessed Arousal/Alertness: Awake/alert Orientation Level: Oriented X4 Year: 2022 Month: December Day of Week: Correct Attention: Selective Focused Attention: Appears intact Sustained Attention: Appears intact Memory: Impaired Memory Impairment: Decreased recall of new information Immediate Memory Recall: Sock;Blue;Bed Memory Recall Sock: Without Cue Memory Recall Blue: Without Cue Memory Recall Bed: Not able to recall Safety/Judgment: Appears intact Comments: appears to be improved from yeasterday. Sensation Sensation Light Touch: Appears Intact Coordination Gross Motor Movements are Fluid and Coordinated: No Fine Motor Movements are Fluid and Coordinated: Yes Motor  Motor Motor: Within Functional Limits Mobility  Transfers Sit to Stand: Contact Guard/Touching assist Stand to Sit: Contact Guard/Touching assist  Trunk/Postural Assessment  Cervical Assessment Cervical Assessment:  (forward head) Thoracic Assessment Thoracic Assessment:  (rounded shoulders) Lumbar Assessment Lumbar Assessment:  (posterior pelvic tilt)  Balance Balance Balance Assessed: Yes Static Sitting Balance Static Sitting - Balance Support: Left upper extremity supported Static Sitting - Level of Assistance: 5: Stand by assistance Dynamic Sitting Balance Dynamic Sitting - Balance Support: During functional activity Dynamic Sitting - Level of Assistance: 5: Stand by assistance Static Standing Balance Static Standing - Balance Support: During functional  activity Static Standing - Level of Assistance: 4: Min assist Dynamic Standing Balance Dynamic Standing - Balance Support: During functional activity Dynamic Standing - Level of Assistance: 4: Min assist Extremity/Trunk Assessment RUE Assessment RUE Assessment: Exceptions to [facility] Passive Range of Motion (  PROM) Comments: DNT at shoulder 2/2 precautions Active Range of Motion (AROM) Comments: DNT at shoulder 2/2 precautions; limited elbow flexion and wrist/digit flexion/extension secondary to pain. General Strength Comments: Gross grasp 3-/5 LUE Assessment LUE Assessment: Within Functional Limits Active Range of Motion (AROM) Comments: Limited AROM at shoulder General Strength Comments: Generalized weakness   [name] [name] 10/30/2021, 3:27 PM

## 2021-10-30 NOTE — Progress Notes (Signed)
Patient staples to the right lower leg removed. 20 staples. No s/s of bleeding or trauma. Patient tolerated bedside procedure well. Verbal order given to remove by MD. Sanda Linger, LPN

## 2021-10-30 NOTE — Progress Notes (Signed)
Called regarding patient's anticipated discharge and orthopedic instructions. For her pelvic fracture will continue to be WBAT .   For her right proximal humerus being 4 weeks out can begin passive ROM as tolerated, she can discontinue the sling. She should work on elbow, wrist, and digit ROM. She should do no lifting more than 2 pounds. I have ordered a repeat right shoulder xray 2 view. If there is reasonable healing then I would allow active ROM as well. I will review the xrays and update.   She should follow up in our clinic at [facility] in 2 weeks with Dr. Stann Mainland or myself. The SNF should call to schedule at [phone].

## 2021-10-30 NOTE — Progress Notes (Signed)
PROGRESS NOTE   Subjective/Complaints: Pt in good spirits. Frustrated by IV in Left upper arm  ROS: Patient denies fever, rash, sore throat, blurred vision, nausea, vomiting, diarrhea, cough, shortness of breath or chest pain, j headache, or mood change.   Objective:   No results found. Recent Labs    10/30/21 0558  WBC 10.9*  HGB 8.7*  HCT 27.5*  PLT 222    Recent Labs    10/28/21 0659 10/30/21 0558  NA 134* 135  K 4.6 4.7  CL 110 109  CO2 18* 20*  GLUCOSE 106* 88  BUN 24* 21  CREATININE 0.99 0.80  CALCIUM 8.0* 8.2*    Intake/Output Summary (Last 24 hours) at 10/30/2021 0820 Last data filed at 10/30/2021 0720 Gross per 24 hour  Intake 535 ml  Output 450 ml  Net 85 ml     Pressure Injury 10/23/21 Elbow Posterior;Right Stage 2 -  Partial thickness loss of dermis presenting as a shallow open injury with a red, pink wound bed without slough. (Active)  10/23/21 Dorthula Nettles, RN3, [place]) [phone]  Location: Elbow  Location Orientation: Posterior;Right  Staging: Stage 2 -  Partial thickness loss of dermis presenting as a shallow open injury with a red, pink wound bed without slough.  Wound Description (Comments):   Present on Admission:     Physical Exam: Vital Signs Blood pressure (!) 157/69, pulse (!) 101, temperature 97.8 F (36.6 C), resp. rate 16, height 4\' 8"  (1.422 m), weight 52.3 kg, SpO2 97 %. Constitutional: No distress . Vital signs reviewed. HEENT: NCAT, EOMI, oral membranes moist Neck: supple Cardiovascular: RRR without murmur. No JVD    Respiratory/Chest: CTA Bilaterally without wheezes or rales. Normal effort    GI/Abdomen: BS +, non-tender, non-distended Ext: no clubbing, cyanosis, or edema Psych: pleasant and cooperative  Skin: Warm and dry.  Right elbow with dressing CDI. Right shin wound with staples, dry. All bruising resolving Psych: Normal mood.  Normal behavior. Musc: Right elbow  with mild edema and tenderness, stable Neuro: Alert RUE: Elbow limited due to pain somewhat, otherwise grossly intact, stable  Assessment/Plan: 1. Functional deficits which require 3+ hours per day of interdisciplinary therapy in a comprehensive inpatient rehab setting. Physiatrist is providing close team supervision and 24 hour management of active medical problems listed below. Physiatrist and rehab team continue to assess barriers to discharge/monitor patient progress toward functional and medical goals  Care Tool:  Bathing    Body parts bathed by patient: Chest, Abdomen, Face   Body parts bathed by helper: Right arm, Left arm, Front perineal area, Right upper leg, Buttocks, Left upper leg, Right lower leg, Left lower leg     Bathing assist Assist Level: Maximal Assistance - Patient 24 - 49%     Upper Body Dressing/Undressing Upper body dressing   What is the patient wearing?: Button up shirt    Upper body assist Assist Level: Total Assistance - Patient < 25%    Lower Body Dressing/Undressing Lower body dressing      What is the patient wearing?: Incontinence brief, Pants     Lower body assist Assist for lower body dressing: Total Assistance - Patient < 25%  Toileting Toileting    Toileting assist Assist for toileting: Total Assistance - Patient < 25%     Transfers Chair/bed transfer  Transfers assist     Chair/bed transfer assist level: Minimal Assistance - Patient > 75% Chair/bed transfer assistive device: Armrests, Cane   Locomotion Ambulation   Ambulation assist      Assist level: Minimal Assistance - Patient > 75% Assistive device: Cane-quad Max distance: 73ft   Walk 10 feet activity   Assist     Assist level: Moderate Assistance - Patient - 50 - 74% Assistive device: Hand held assist   Walk 50 feet activity   Assist Walk 50 feet with 2 turns activity did not occur: Safety/medical concerns         Walk 150 feet  activity   Assist Walk 150 feet activity did not occur: Safety/medical concerns         Walk 10 feet on uneven surface  activity   Assist Walk 10 feet on uneven surfaces activity did not occur: Safety/medical concerns         Wheelchair     Assist Is the patient using a wheelchair?: Yes Type of Wheelchair: Manual    Wheelchair assist level: Dependent - Patient 0% Max wheelchair distance: 150'    Wheelchair 50 feet with 2 turns activity    Assist        Assist Level: Dependent - Patient 0%   Wheelchair 150 feet activity     Assist      Assist Level: Dependent - Patient 0%   Blood pressure (!) 157/69, pulse (!) 101, temperature 97.8 F (36.6 C), resp. rate 16, height 4\' 8"  (1.422 m), weight 52.3 kg, SpO2 97 %.  Medical Problem List and Plan: 1. Functional deficits secondary to poly trauma, pelvic and humeral fracture  -Continue CIR therapies including PT, OT , SNF now pend 2.  Antithrombotics: -DVT/anticoagulation:  Mechanical: Sequential compression devices, below knee Bilateral lower extremities.              -antiplatelet therapy: N/A 3. Pain Management: Hydrocodone 3 times daily with as needed oxycodone for breakthrough pain  Controlled with meds on 12/4 4. Mood: LCSW to follow for evaluation and support             -antipsychotic agents: N/A 5. Neuropsych: This patient is capable of making decisions on her own behalf. 6. Skin/Wound Care: dc staples RLE 7. Fluids/Electrolytes/Nutrition: encouraging PO 8. PAF: Off eliquis. Monitor Heart rate TID on coreg BID. 9. HTN: Monitor BP -on Norvasc, Coreg and lisinopril Vitals:   10/29/21 1937 10/30/21 0507  BP: (!) 142/45 (!) 157/69  Pulse: 82 (!) 101  Resp: 16 16  Temp: 98 F (36.7 C) 97.8 F (36.6 C)  SpO2: 98% 97%  lisinopril held d/t hyperkalemia 11/30 increased Norvasc to 10mg  with improved bp 12/5 fair control 10. Hyperkalemia:   Potassium 4.7 on 12/5   11. PMR: Followed by  Percival Spanish and has been managed with medrol 4 mg daily.  12. Right humerus fracture: NWB RUE  --May doff sling for ADLs with right forearm close to body. 13. Urinary retention: foley removed  -seems to emptying with low residuals 14. Constipation:  -continue senna-s 1 tab at hs, diet, enc liquids   Improving 15. Nausea: Continue zofran BID. Marland Kitchen  16.  Leukocytosis: Resolved 17. AKI/hyponatremia:   -continue IV NS  QHS  12/5 BUN/creatinine improved  21/0.8   Sodium up to 135   -dc iv, ivf   -  encourage appropriate po intake  LOS: 12 days A FACE TO FACE EVALUATION WAS PERFORMED  Meredith Staggers 10/30/2021, 8:20 AM

## 2021-10-30 NOTE — Progress Notes (Signed)
Subjective:  [name] is a 87 y.o. female,     s/p  nonoperative  pubic ramus fx, right proximal humerus fx   Patient reports pain as mild to moderate.  Reports she has been in her sling, doesn't feel like she needs the sling. She has moved her shoulder some. She denies numbness or tingling. Denies  hip or pelvic pain.   Objective:   VITALS:   Vitals:   10/29/21 1320 10/29/21 1937 10/30/21 0500 10/30/21 0507  BP: (!) 120/53 (!) 142/45  (!) 157/69  Pulse: 81 82  (!) 101  Resp: 18 16  16   Temp: 97.7 F (36.5 C) 98 F (36.7 C)  97.8 F (36.6 C)  TempSrc: Oral     SpO2: 100% 98%  97%  Weight:   52.3 kg   Height:         Right Upper Extremity:   INSPECTION & PALPATION: No gross deformity of the shoulder , no tenderness to palpation over the fracture site.  No open wounds.   SENSORY: sensation is intact to light touch in:  superficial radial nerve distribution (dorsal first web space) median nerve distribution (tip of index finger)   ulnar nerve distribution (tip of small finger)    Axillary nerve distribution (lateral shoulder)    Elbow ROM: 25-140 Shoulder ROM: Passive FF 105, active FF 95. Ext rotation 45.    MOTOR:  + motor posterior interosseous nerve (thumb IP extension) + anterior interosseous nerve (thumb IP flexion, index finger DIP flexion) + radial nerve (wrist extension) + median nerve (palpable firing thenar mass) + ulnar nerve (palpable firing of first dorsal interosseous muscle) + axiallry nerve (palpable firing of deltoid)   VASCULAR: 2+ radial pulse, brisk capillary refill < 2 sec, fingers warm and well-perfused     Lab Results  Component Value Date   WBC 10.9 (H) 10/30/2021   HGB 8.7 (L) 10/30/2021   HCT 27.5 (L) 10/30/2021   MCV 98.6 10/30/2021   PLT 222 10/30/2021   BMET    Component Value Date/Time   NA 135 10/30/2021 0558   K 4.7 10/30/2021 0558   CL 109 10/30/2021 0558   CO2 20 (L) 10/30/2021 0558   GLUCOSE 88 10/30/2021  0558   BUN 21 10/30/2021 0558   CREATININE 0.80 10/30/2021 0558   CALCIUM 8.2 (L) 10/30/2021 0558   GFRNONAA >60 10/30/2021 0558     Assessment/Plan:     Principal Problem:   Pelvic fracture (HCC) Active Problems:   Closed fracture of right pubis (HCC)   Acute blood loss anemia   Leukocytosis   Hyperkalemia   Essential hypertension   Right elbow pain   Pressure injury of skin   Advance diet Up with therapy  Patient reevaluated this evening prior to discharge to SNF.  Regarding her pelvis fractures patient denies any significant pain or discomfort related to this.  For her right proximal humerus she has stayed in her sling and has not worked on much elbow range of motion with stiff and elbow extension.  Denies any significant pain of the right shoulder but soreness with increased motion.  She reports she has done some motion by herself.  Repeat x-rays were obtained today in reviewed showing some settling of her fracture but reasonable alignment and small amount of callus noted.  This continues to be amenable to nonoperative treatment.  Below are my instructions for the SNF upon discharge.  Nonoperative pubic ramus fracture: Weightbearing as tolerated to the  bilateral lower extremities without restrictions.  Range of motion as tolerated without restrictions.  PT for gait training.  Right proximal humerus fracture:  Weightbearing status: No lifting more than 2 pounds.  Okay to use arm to attempt transfers using pain as her guide. Range of motion: Active and passive range of motion as tolerated using pain as her guide for the right shoulder and elbow.  Encouraged patient to work on elbow wrist and digit range of motion as she is stiff with elbow extension.  For her right shoulder she can work with physical therapy on increasing her range of motion passive and active.  She should follow up in our clinic at [facility] in 2 weeks with Dr. Stann Mainland or Jonelle Sidle.  call to schedule at  [phone].      Faythe Casa 10/30/2021, 5:04 PM  Jonelle Sidle PA-C  Physician Assistant with Dr. Lillia Abed [place]

## 2021-10-30 NOTE — Progress Notes (Signed)
Patient ID: [name], female   DOB: [birth year], 87 y.o.   MRN: [id number]  Still waiting on NCPASRR#.  PASSR#: [id number]  SW received preferred SNF list from patient family:  [facility] informed not able to explore as location only takes own patients [facility]- Left message for Loree Fee Stroud/Admissions [phone]) about referral and waiting on follow-up.  -SW received return phone call extending bed offer. They have private ($41 per day) and semi-private rooms. SW informed will follow-up with medical team and family to discuss. SW spoke with pt dtr [name] to inform on acceptance. Agreeable to semi-private room. SW informed will follow-up about mode of transportation in the event not able to get ambulance transport covered by insurance. SW informed will confirm all final details. SW updated medical team and waiting on clearance for d/c. -Pt cleared for d/c tomorrow to [facility]. SW spoke with [facility] and informed on family accepting semi-private room. Will need COVID test and d/c summary. Will get COVID vaccination records later from family. Pt will go to Rm# 101/Nurse Report [phone]. [facility] ambulance [phone]) pick up at 10:30am. SW met with pt to discuss above.   [place]- SW left message for Debbie/Admissions about referral and if received fax. Waiting on follow-up. *SW spoke with Debbie/Admissions rescinding consideration for bed.   [facility] spoke with Dian Situ Styles/Admissions [phone]) to discuss referral. Reports will have team review.  *SW spoke with Dian Situ rescinding consideration for bed.   [facility] spoke with Tracy/Admissions ([phone]) to discuss referral and no planned discharges at this time but will follow-up if they will have any. SW left message rescinding consideration for bed.    Loralee Pacas,  MSW, [place] Office: [phone] Cell: [phone] Fax: [phone]

## 2021-10-30 NOTE — Discharge Instructions (Signed)
Inpatient Rehab Discharge Instructions  [name] Discharge date and time:    Activities/Precautions/ Functional Status: Activity: activity as tolerated Diet: regular diet Wound Care: keep wound clean and dry   Functional status:  ___ No restrictions     ___ Walk up steps independently ___ 24/7 supervision/assistance   ___ Walk up steps with assistance ___ Intermittent supervision/assistance  ___ Bathe/dress independently ___ Walk with walker     ___ Bathe/dress with assistance ___ Walk Independently    ___ Shower independently ___ Walk with assistance    ___ Shower with assistance ___ No alcohol     ___ Return to work/school ________  Special Instructions:    My questions have been answered and I understand these instructions. I will adhere to these goals and the provided educational materials after my discharge from the hospital.  Patient/Caregiver Signature _______________________________ Date __________  Clinician Signature _______________________________________ Date __________  Please bring this form and your medication list with you to all your follow-up doctor's appointments.

## 2021-10-30 NOTE — Progress Notes (Incomplete)
Physical Therapy Session Note  Patient Details  Name: [name] MRN: [id number] Date of Birth: 01-Jul-1932  {CHL IP REHAB PT TIME CALCULATION:[id number]}  Short Term Goals: Week 2:  PT Short Term Goal 1 (Week 2): STGs=LTGs  Skilled Therapeutic Interventions/Progress Updates:     Pt received supine in bed and agrees to therapy. Reports pain in new IV site in L upper arm, as well as pain in R hip and shoulders. Number not provided. Pt provides rest breaks and mobility to manage pain. Pt performs supine to sit with bed features and cues for sequencing and positioning. Sit to stand with cues for anterior weight shift, with stand step transfer requiring minA overall with cues for upright gaze to improve posture and balance, and foot placement for safety. WC transport to gym for time management. Pt ambulates x110' with L lofstrand crutch and CGA/minA at hips and trunk for stability, with cues for posture and lateral weight shifting. Extended seated rest break required due to fatigue. Pt then ambulates additional 120' with same assist level.  WC transport back to room. Pt left seated in WC with alarm intact and all needs within reach.  Therapy Documentation Precautions:  Precautions Precautions: Fall Precaution Comments: High fall risk Required Braces or Orthoses: Sling Restrictions Weight Bearing Restrictions: Yes RUE Weight Bearing: Non weight bearing RLE Weight Bearing: Weight bearing as tolerated LLE Weight Bearing: Weight bearing as tolerated Other Position/Activity Restrictions: Sling at all times except for during ADLs; keep elbow close to body when not in sling. No PROM/AROM at R shoulder for 2-3 weeks    Therapy/Group: Individual Therapy  Breck Coons 10/30/2021, 9:30 AM

## 2021-10-30 NOTE — Discharge Summary (Signed)
Physician Discharge Summary  Patient ID: [name] MRN: [id number] DOB/AGE: 1932/06/04 87 y.o.  Admit date: 10/18/2021 Discharge date: 10/31/2021  Discharge Diagnoses:  Principal Problem:   Pelvic fracture [facility]) Active Problems:   Closed fracture of right pubis (HCC)   Acute blood loss anemia   Leukocytosis   Hyperkalemia   Essential hypertension   Right elbow pain   Pressure injury of skin   Discharged Condition: good  Significant Diagnostic Studies: DG Shoulder Right  Result Date: 10/30/2021 CLINICAL DATA:  Follow-up fracture EXAM: RIGHT SHOULDER - 2+ VIEW COMPARISON:  09/30/2021 FINDINGS: AC joint is intact. Comminuted fracture involving the right humeral neck and greater tuberosity with slight increased valgus angulation of the shaft. Suspicion of early periosteal new bone formation. There may be slight anterior inferior subluxation of the humeral head. IMPRESSION: 1. Comminuted fracture involving right humeral neck and greater tuberosity with slight increased angulation compared to prior, suspect early callus formation. 2. There may be slight anterior inferior subluxation of the humeral head. Electronically Signed   By: Donavan Foil M.D.   On: 10/30/2021 16:14      Labs:  Basic Metabolic Panel: Recent Labs  Lab 10/26/21 0523 10/27/21 0614 10/28/21 0659 10/30/21 0558  NA 127* 128* 134* 135  K 5.6* 5.2* 4.6 4.7  CL 100 105 110 109  CO2 19* 18* 18* 20*  GLUCOSE 91 86 106* 88  BUN 36* 31* 24* 21  CREATININE 1.42* 1.02* 0.99 0.80  CALCIUM 7.8* 8.0* 8.0* 8.2*    CBC: CBC Latest Ref Rng & Units 10/30/2021 10/23/2021 10/19/2021  WBC 4.0 - 10.5 K/uL 10.9(H) 9.2 14.5(H)  Hemoglobin 12.0 - 15.0 g/dL 8.7(L) 8.9(L) 9.2(L)  Hematocrit 36.0 - 46.0 % 27.5(L) 27.6(L) 29.0(L)  Platelets 150 - 400 K/uL 222 273 367     CBG: Recent Labs  Lab 10/27/21 1143  GLUCAP 101*    Brief HPI:   [name] is a 87 y.o. female with history of HTN, SSS/PAF, and ICM, CKD 3,  PMR-chronic steroids who was originally admitted to [facility] 10/01/2021 with AKI, anemia, fall with subsequent right proximal humerus fracture and right pelvic fracture.  She was treated with IV antibiotics due to concern of UTI and sepsis as well as IV fluids for hydration.  She required Foley placement for urinary retention as well as 1 unit PRBCs for anemia.  She was transferred to [facility] on 11/11 for intensive rehab program to consist of PT OT at least 3 hours a day.  Hospital course was significant for issues with nausea as well as constipation as well as acute on chronic renal failure.  Foley was kept in place due to urinary retention and immobility.    She did develop rectal bleeding with drop in Hgb to 6.2 and was transferred to acute hospital for work up. She was transfused with one unit PRBC and Dr.Outlaw who evaluated patient and recommended serial CBC with supportive care as patient was not felt to be a candidate for colonoscopy due to her multiple comorbidities.  CT abdomen and pelvis was negative for acute changes. Serial H&H was stable without recurrent bleeding.  Zofran was scheduled BID for nausea and Hydrocodone TIC for pain management.  She had issues with recurrent hyperkalemia requiring dose of Lokelma.  Therapy was resumed and patient continued to be limited by weakness, RUE weightbearing restriction, pain, fatigue and balance deficits affecting mobility as well as ADLs.  CIR was recommended to functional decline.   Hospital  Course: [name] was admitted to rehab 10/18/2021 for inpatient therapies to consist of PT and OT at least three hours five days a week. Past admission physiatrist, therapy team and rehab RN have worked together to provide customized collaborative inpatient rehab.  Her blood pressures were monitored on TID basis and have been reasonably controlled.  SCDs were used for DVT prophylaxis.  Pain has been controlled with as needed use of hydrocodone for  moderate pain and oxycodone for severe pain.  Serial check of electrolytes showed intermittent hyperkalemia which was treated with Lokelma.  Lisinopril was discontinued with improvement in potassium levels. She was also noted to have AKI with hyponatremia and was treated with IV fluids with improvement overall.  Most recent check of electrolytes shows normalization of renal status as well as sodium levels.  Senna S was added to help manage constipation.    She continued to have intermittent issues with nausea despite Zofran on board. Question due to anxiety/reflux and recommend prn compazine additionally. Her o intake has been improving.  Serial check of CBC shows H&H to be slowly improving leukocytosis has almost resolved. She had partial thickness skin loss on right elbow and this stage II abrasion was treated with foam dressing.  Ortho was contacted for input and recommended continuing nonweightbearing on RUE but cleared to begin passive ROM and lifting no more than 2 pounds.  X-rays done for follow-up prior to discharge showing reasonable alignment and small amount of callus. She has been cleared to use RUE with 2 lbs wt limit and using pain as guide. She has been making slow gains and family has elected on SNF for further therapy. Bed is available at [facility] and she was discharged on 10/31/21   Rehab course: During patient's stay in rehab weekly team conferences were held to monitor patient's progress, set goals and discuss barriers to discharge. At admission, patient required max to total assist with basic ADL tasks and mod assist with mobility. She  has had improvement in activity tolerance, balance, postural control as well as ability to compensate for deficits. She is able to complete ADL tasks at sink with mod assist.  She requires  min assist with transfers and is able to ambulate 110' with left lofstrand crutch and min assist for stability and posture. She is showing improvement in anterior weight  shifting, standing balance and activity tolerance.    Disposition: [place].   Diet:  Regular    Special Instructions: 1.Right arm WB status: No lifting more than 2 pounds.  Okay to use arm to attempt transfers using pain as her guide. 2. Range of motion: Active and passive range of motion as tolerated using pain as her guide for the right shoulder and elbow.  Encourage patient to work on elbow wrist and digit range of motion as she is stiff with elbow extension.  For her right shoulder she can work with physical therapy on increasing her range of motion passive and active. 3. Repeat CBC on Friday to monitor H/H.  4. Contac Dr. Paulita Fujita for input on GI issues or further drop in H/H.    Allergies as of 10/31/2021       Reactions   Pertussis Vaccines Swelling   Sulfa Antibiotics Nausea Only   Amoxicillin Rash, Other (See Comments)   Has patient had a PCN reaction causing immediate rash, facial/tongue/throat swelling, SOB or lightheadedness with hypotension: yes Has patient had a PCN reaction causing severe rash involving mucus membranes or skin  necrosis: no Has patient had a PCN reaction that required hospitalization no Has patient had a PCN reaction occurring within the last 10 years: yes If all of the above answers are "NO", then may proceed with Cephalosporin use.        Medication List     STOP taking these medications    allopurinol 100 MG tablet Commonly known as: ZYLOPRIM   aspirin EC 81 MG tablet   lisinopril 10 MG tablet Commonly known as: ZESTRIL   omeprazole 20 MG tablet Commonly known as: PRILOSEC OTC       TAKE these medications    acetaminophen 325 MG tablet Commonly known as: TYLENOL Take 1-2 tablets (325-650 mg total) by mouth every 4 (four) hours as needed for mild pain.   albuterol (2.5 MG/3ML) 0.083% nebulizer solution Commonly known as: PROVENTIL Take 3 mLs (2.5 mg total) by nebulization every 6 (six) hours as needed for wheezing  or shortness of breath.   amLODipine 10 MG tablet Commonly known as: NORVASC Take 1 tablet (10 mg total) by mouth daily. What changed:  medication strength how much to take   atorvastatin 40 MG tablet Commonly known as: LIPITOR Take 1 tablet (40 mg total) by mouth daily.   carvedilol 6.25 MG tablet Commonly known as: COREG Take 1 tablet (6.25 mg total) by mouth 2 (two) times daily with a meal.   hydrALAZINE 25 MG tablet Commonly known as: APRESOLINE Take 1 tablet (25 mg total) by mouth every 6 (six) hours as needed (SBP>150 or DBP>100).   HYDROcodone-acetaminophen 10-325 MG tablet Commonly known as: NORCO Take 1 tablet by mouth 3 (three) times daily before meals.   levothyroxine 75 MCG tablet Commonly known as: SYNTHROID Take 1 tablet (75 mcg total) by mouth daily.   melatonin 5 MG Tabs Take 1 tablet (5 mg total) by mouth at bedtime as needed.   methylPREDNISolone 4 MG tablet Commonly known as: MEDROL Take 1 tablet (4 mg total) by mouth daily. What changed:  medication strength how much to take   ondansetron 4 MG tablet Commonly known as: ZOFRAN Take 1 tablet (4 mg total) by mouth every 12 (twelve) hours.   oxyCODONE 5 MG immediate release tablet Commonly known as: Oxy IR/ROXICODONE Take 1 tablet (5 mg total) by mouth 2 (two) times daily as needed for severe pain. What changed: when to take this   pantoprazole 40 MG tablet Commonly known as: PROTONIX Take 1 tablet (40 mg total) by mouth daily.   prochlorperazine 5 MG tablet Commonly known as: COMPAZINE Take 1-2 tablets (5-10 mg total) by mouth every 6 (six) hours as needed for nausea.   senna-docusate 8.6-50 MG tablet Commonly known as: Senokot-S Take 1 tablet by mouth at bedtime.   simethicone 40 MG/0.6ML drops Commonly known as: MYLICON Take 1.2 mLs (80 mg total) by mouth 4 (four) times daily.   sodium phosphate 7-19 GM/118ML Enem Place 133 mLs (1 enema total) rectally once as needed for severe  constipation.   traZODone 50 MG tablet Commonly known as: DESYREL Take 0.5-1 tablets (25-50 mg total) by mouth at bedtime as needed for sleep.        Contact information for follow-up providers     Meredith Staggers, MD Follow up.   Specialty: Physical Medicine and Rehabilitation Why: As needed Contact information: [address] [place] [address] [phone]         Arta Silence, MD. Call.   Specialty: Gastroenterology Why: for GI  issues, As needed Contact information: [address] [place] [address] [phone]         Nicholes Stairs, MD. Call on 11/07/2021.   Specialty: Orthopedic Surgery Why: Be there at for follow up appointment Contact information: [address] [place] [address] [phone]              Contact information for after-discharge care     Destination     [facility] .   Service: Skilled Nursing Contact information: [address] [place] [phone]                     Signed: Bary Leriche 10/31/2021, 9:54 AM

## 2021-10-30 NOTE — Plan of Care (Signed)
  Problem: Consults Goal: RH GENERAL PATIENT EDUCATION Description: See Patient Education module for education specifics. Outcome: Progressing Goal: Skin Care Protocol Initiated - if Braden Score 18 or less Description: If consults are not indicated, leave blank or document N/A Outcome: Progressing   Problem: RH BOWEL ELIMINATION Goal: RH STG MANAGE BOWEL WITH ASSISTANCE Description: STG Manage Bowel with min Assistance. Outcome: Progressing Goal: RH STG MANAGE BOWEL W/MEDICATION W/ASSISTANCE Description: STG Manage Bowel with Medication with min Assistance. Outcome: Progressing   Problem: RH BLADDER ELIMINATION Goal: RH STG MANAGE BLADDER WITH ASSISTANCE Description: STG Manage Bladder With min Assistance Outcome: Progressing Goal: RH STG MANAGE BLADDER WITH MEDICATION WITH ASSISTANCE Description: STG Manage Bladder With Medication With min Assistance. Outcome: Progressing   Problem: RH SKIN INTEGRITY Goal: RH STG MAINTAIN SKIN INTEGRITY WITH ASSISTANCE Description: STG Maintain Skin Integrity With min Assistance. Outcome: Progressing Goal: RH STG ABLE TO PERFORM INCISION/WOUND CARE W/ASSISTANCE Description: STG Able To Perform Incision/Wound Care With min Assistance. Outcome: Progressing   Problem: RH SAFETY Goal: RH STG ADHERE TO SAFETY PRECAUTIONS W/ASSISTANCE/DEVICE Description: STG Adhere to Safety Precautions With Cues and reminders. Outcome: Progressing Goal: RH STG DECREASED RISK OF FALL WITH ASSISTANCE Description: STG Decreased Risk of Fall With min Assistance. Outcome: Progressing   Problem: RH PAIN MANAGEMENT Goal: RH STG PAIN MANAGED AT OR BELOW PT'S PAIN GOAL Description: < 3 on a 0-10 pain scale. Outcome: Progressing   Problem: RH KNOWLEDGE DEFICIT GENERAL Goal: RH STG INCREASE KNOWLEDGE OF SELF CARE AFTER HOSPITALIZATION Description: Patient will demonstrate knowledge of medication/pain management, skin/wound care, weight bearing precautions, and fall  precautions with educational materials and handouts provided by staff independently at discharge. Outcome: Progressing

## 2021-10-30 NOTE — Progress Notes (Signed)
Occupational Therapy Session Note  Patient Details  Name: [name] MRN: [id number] Date of Birth: [birth year]  Today's Date: 10/30/2021 OT Individual Time: 1445-1530 OT Individual Time Calculation (min): 45 min  and Today's Date: 10/30/2021 OT Missed Time: 30 Minutes Missed Time Reason: X-Ray   Short Term Goals: Week 1:  OT Short Term Goal 1 (Week 1): Patient will complete 1 step of UB dressing task OT Short Term Goal 1 - Progress (Week 1): Met OT Short Term Goal 2 (Week 1): Patient will complete 1 step of LB dressing task using LRAD OT Short Term Goal 2 - Progress (Week 1): Progressing toward goal OT Short Term Goal 3 (Week 1): Patient will tolerate standing for 2 minutes in preparation for BADL Task OT Short Term Goal 3 - Progress (Week 1): Met OT Short Term Goal 4 - Progress (Week 1): Met Week 2:  OT Short Term Goal 1 (Week 2): LTG=STG 2/2 ELOS   Skilled Therapeutic Interventions/Progress Updates:    Pt in hallway at time of OT session being transported to Xray with technician. OT present for session 30 minutes later and pt back in room, ready for OT session. Missed 30 mins 2/2 Xray. Note pt pleasant and tangental, having questions at beginning of session regarding SNF, further rehab, etc which were answered. Recommended further questions to MD if pt has any further questions. Pt needing to toilet at this time, encouraged pt to use bathroom instead of BSC by bed and pt agreeable. Educated on importance of OOB toileting instead of using bed pan as she states she sometimes uses this at night. Supine > sit Min A, stand pivot bed <> wheelchair <> toilet with BSC over top with HHA. Fearful of falling throughout but no LOB. Mod A for clothing management, able to perform front hygiene with Supervision seated. Pt back in bed resting alarm on call bell in reach.  Therapy Documentation Precautions:  Precautions Precautions: Fall Precaution Comments: High fall risk Required Braces or  Orthoses: Sling Restrictions Weight Bearing Restrictions: Yes RUE Weight Bearing: Non weight bearing RLE Weight Bearing: Weight bearing as tolerated LLE Weight Bearing: Weight bearing as tolerated Other Position/Activity Restrictions: Sling at all times except for during ADLs; keep elbow close to body when not in sling. No PROM/AROM at R shoulder for 2-3 weeks    Therapy/Group: Individual Therapy  Viona Gilmore 10/30/2021, 7:30 AM

## 2021-10-30 NOTE — Plan of Care (Signed)
  Problem: RH Balance Goal: LTG: Patient will maintain dynamic sitting balance (OT) Description: LTG:  Patient will maintain dynamic sitting balance with assistance during activities of daily living (OT) Outcome: Completed/Met Goal: LTG Patient will maintain dynamic standing with ADLs (OT) Description: LTG:  Patient will maintain dynamic standing balance with assist during activities of daily living (OT)  Outcome: Completed/Met   Problem: Sit to Stand Goal: LTG:  Patient will perform sit to stand in prep for activites of daily living with assistance level (OT) Description: LTG:  Patient will perform sit to stand in prep for activites of daily living with assistance level (OT) Outcome: Completed/Met   Problem: RH Eating Goal: LTG Patient will perform eating w/assist, cues/equip (OT) Description: LTG: Patient will perform eating with assist, with/without cues using equipment (OT) Outcome: Completed/Met   Problem: RH Grooming Goal: LTG Patient will perform grooming w/assist,cues/equip (OT) Description: LTG: Patient will perform grooming with assist, with/without cues using equipment (OT) Outcome: Completed/Met   Problem: RH Bathing Goal: LTG Patient will bathe all body parts with assist levels (OT) Description: LTG: Patient will bathe all body parts with assist levels (OT) Outcome: Completed/Met   Problem: RH Dressing Goal: LTG Patient will perform upper body dressing (OT) Description: LTG Patient will perform upper body dressing with assist, with/without cues (OT). Outcome: Completed/Met   Problem: RH Toileting Goal: LTG Patient will perform toileting task (3/3 steps) with assistance level (OT) Description: LTG: Patient will perform toileting task (3/3 steps) with assistance level (OT)  Outcome: Completed/Met   Problem: RH Toilet Transfers Goal: LTG Patient will perform toilet transfers w/assist (OT) Description: LTG: Patient will perform toilet transfers with assist,  with/without cues using equipment (OT) Outcome: Completed/Met

## 2021-10-31 DIAGNOSIS — I480 Paroxysmal atrial fibrillation: Secondary | ICD-10-CM | POA: Diagnosis not present

## 2021-10-31 DIAGNOSIS — D62 Acute posthemorrhagic anemia: Secondary | ICD-10-CM | POA: Diagnosis not present

## 2021-10-31 DIAGNOSIS — R102 Pelvic and perineal pain: Secondary | ICD-10-CM | POA: Diagnosis not present

## 2021-10-31 DIAGNOSIS — R195 Other fecal abnormalities: Secondary | ICD-10-CM | POA: Diagnosis not present

## 2021-10-31 DIAGNOSIS — S32501D Unspecified fracture of right pubis, subsequent encounter for fracture with routine healing: Secondary | ICD-10-CM | POA: Diagnosis not present

## 2021-10-31 DIAGNOSIS — I059 Rheumatic mitral valve disease, unspecified: Secondary | ICD-10-CM | POA: Diagnosis not present

## 2021-10-31 DIAGNOSIS — I429 Cardiomyopathy, unspecified: Secondary | ICD-10-CM | POA: Diagnosis not present

## 2021-10-31 DIAGNOSIS — W19XXXD Unspecified fall, subsequent encounter: Secondary | ICD-10-CM | POA: Diagnosis not present

## 2021-10-31 DIAGNOSIS — I10 Essential (primary) hypertension: Secondary | ICD-10-CM | POA: Diagnosis not present

## 2021-10-31 DIAGNOSIS — S32301D Unspecified fracture of right ilium, subsequent encounter for fracture with routine healing: Secondary | ICD-10-CM | POA: Diagnosis not present

## 2021-10-31 DIAGNOSIS — Z743 Need for continuous supervision: Secondary | ICD-10-CM | POA: Diagnosis not present

## 2021-10-31 DIAGNOSIS — M25511 Pain in right shoulder: Secondary | ICD-10-CM | POA: Diagnosis not present

## 2021-10-31 DIAGNOSIS — S329XXD Fracture of unspecified parts of lumbosacral spine and pelvis, subsequent encounter for fracture with routine healing: Secondary | ICD-10-CM | POA: Diagnosis not present

## 2021-10-31 DIAGNOSIS — S42201D Unspecified fracture of upper end of right humerus, subsequent encounter for fracture with routine healing: Secondary | ICD-10-CM | POA: Diagnosis not present

## 2021-10-31 DIAGNOSIS — L8990 Pressure ulcer of unspecified site, unspecified stage: Secondary | ICD-10-CM | POA: Diagnosis not present

## 2021-10-31 DIAGNOSIS — E785 Hyperlipidemia, unspecified: Secondary | ICD-10-CM | POA: Diagnosis not present

## 2021-10-31 DIAGNOSIS — S3289XA Fracture of other parts of pelvis, initial encounter for closed fracture: Secondary | ICD-10-CM | POA: Diagnosis not present

## 2021-10-31 DIAGNOSIS — E871 Hypo-osmolality and hyponatremia: Secondary | ICD-10-CM | POA: Diagnosis not present

## 2021-10-31 DIAGNOSIS — E782 Mixed hyperlipidemia: Secondary | ICD-10-CM | POA: Diagnosis not present

## 2021-10-31 DIAGNOSIS — M25521 Pain in right elbow: Secondary | ICD-10-CM | POA: Diagnosis not present

## 2021-10-31 DIAGNOSIS — D649 Anemia, unspecified: Secondary | ICD-10-CM | POA: Diagnosis not present

## 2021-10-31 DIAGNOSIS — D509 Iron deficiency anemia, unspecified: Secondary | ICD-10-CM | POA: Diagnosis not present

## 2021-10-31 DIAGNOSIS — E039 Hypothyroidism, unspecified: Secondary | ICD-10-CM | POA: Diagnosis not present

## 2021-10-31 DIAGNOSIS — I110 Hypertensive heart disease with heart failure: Secondary | ICD-10-CM | POA: Diagnosis not present

## 2021-10-31 DIAGNOSIS — I959 Hypotension, unspecified: Secondary | ICD-10-CM | POA: Diagnosis not present

## 2021-10-31 DIAGNOSIS — Z95810 Presence of automatic (implantable) cardiac defibrillator: Secondary | ICD-10-CM | POA: Diagnosis not present

## 2021-10-31 DIAGNOSIS — I5022 Chronic systolic (congestive) heart failure: Secondary | ICD-10-CM | POA: Diagnosis not present

## 2021-10-31 DIAGNOSIS — E875 Hyperkalemia: Secondary | ICD-10-CM | POA: Diagnosis not present

## 2021-10-31 DIAGNOSIS — D72829 Elevated white blood cell count, unspecified: Secondary | ICD-10-CM | POA: Diagnosis not present

## 2021-10-31 DIAGNOSIS — R1310 Dysphagia, unspecified: Secondary | ICD-10-CM | POA: Diagnosis not present

## 2021-10-31 DIAGNOSIS — R110 Nausea: Secondary | ICD-10-CM | POA: Diagnosis not present

## 2021-10-31 LAB — SARS CORONAVIRUS 2 (TAT 6-24 HRS): SARS Coronavirus 2: NEGATIVE

## 2021-10-31 MED ORDER — PROCHLORPERAZINE MALEATE 5 MG PO TABS: 5.0000 mg | ORAL_TABLET | Freq: Four times a day (QID) | ORAL | 0 refills | Status: DC | PRN

## 2021-10-31 NOTE — Consult Note (Signed)
   [facility] Inpatient Consult   10/31/2021  AL GAGEN 15-Jan-1932 [id number]  [place] [facility] [ACO] Patient: Medicare  Primary Care Provider:  Lawerance Cruel, MD, [facility]   Plan:   Will alert [facility] PAC RN to follow for any known or needs for transitional care needs for returning to post facility care or complex disease management.  For questions or referrals, please contact:   Natividad Brood, RN BSN [facility] Liaison  [phone] business mobile phone Toll free office [phone]  Fax number: [phone] [email or web address]

## 2021-10-31 NOTE — Progress Notes (Signed)
Report given to Cindy Hazy, Nurse at [facility]. Transport is here to pick up patient. Pain meds given prior to discharge. No further question from pt at this time.  Gerald Stabs, RN

## 2021-10-31 NOTE — Progress Notes (Signed)
Physical Therapy Discharge Summary  Patient Details  Name: [name] MRN: [id number] Date of Birth: [birth year]   Patient has met 1 of 8 long term goals due to improved activity tolerance, improved balance, improved postural control, increased strength, and decreased pain.  Patient to discharge at an ambulatory level [name].   Patient's daughters are unable to provide adequate level of assistance for pt at this time, so pt is discharging to SNF.  Reasons goals not met: Pt discharging to SNF at this time and is mobilizing at [facility] level overall.  Recommendation:  Patient will benefit from ongoing skilled PT services in skilled nursing facility setting to continue to advance safe functional mobility, address ongoing impairments in bed mobility, balance, strenth, and minimize fall risk.  Equipment: No equipment provided  Reasons for discharge: discharge from hospital  Patient/family agrees with progress made and goals achieved: Yes  PT Discharge Precautions/Restrictions Precautions Precautions: Fall Precaution Comments: High fall risk Required Braces or Orthoses: Sling Restrictions Weight Bearing Restrictions: Yes RUE Weight Bearing: Non weight bearing RLE Weight Bearing: Weight bearing as tolerated LLE Weight Bearing: Weight bearing as tolerated Other Position/Activity Restrictions: Sling at all times except for during ADLs; keep elbow close to body when not in sling. No PROM/AROM at R shoulder for 2-3 weeks Pain Interference Pain Interference Pain Effect on Sleep: 2. Occasionally Pain Interference with Therapy Activities: 2. Occasionally Pain Interference with Day-to-Day Activities: 3. Frequently Vision/Perception  Vision - History Ability to See in Adequate Light: 0 Adequate Perception Perception: Within Functional Limits Praxis Praxis: Intact  Cognition Overall Cognitive Status: Within Functional Limits for tasks assessed Arousal/Alertness:  Awake/alert Orientation Level: Oriented X4 Safety/Judgment: Appears intact Sensation Sensation Light Touch: Appears Intact Coordination Gross Motor Movements are Fluid and Coordinated: No Fine Motor Movements are Fluid and Coordinated: Yes Heel Shin Test: Limited by weakness Motor  Motor Motor: Within Functional Limits  Mobility Bed Mobility Bed Mobility: Supine to Sit;Sit to Supine Rolling Right: Supervision/verbal cueing Supine to Sit: Supervision/Verbal cueing Sit to Supine: Supervision/Verbal cueing Transfers Transfers: Sit to Stand;Stand to Sit;Stand Pivot Transfers Sit to Stand: Contact Guard/Touching assist Stand to Sit: Contact Guard/Touching assist Stand Pivot Transfers: Contact Guard/Touching assist Stand Pivot Transfer Details: Verbal cues for technique;Verbal cues for precautions/safety Transfer (Assistive device): 1 person hand held assist Locomotion  Gait Ambulation: Yes Gait Assistance: Minimal Assistance - Patient > 75% Gait Distance (Feet): 120 Feet Assistive device:  (L Lofstrand Crutch) Gait Assistance Details: Verbal cues for gait pattern;Verbal cues for technique;Tactile cues for placement;Tactile cues for sequencing;Verbal cues for sequencing Gait Gait: Yes Gait Pattern: Impaired Gait Pattern: Decreased stride length;Wide base of support Gait velocity: Decreased Stairs / Additional Locomotion Stairs: Yes Stairs Assistance: Minimal Assistance - Patient > 75% Stair Management Technique: One rail Left Number of Stairs: 4 Height of Stairs: 6 Curb: Minimal Assistance - Patient >75% Wheelchair Mobility Wheelchair Mobility: No  Trunk/Postural Assessment  Cervical Assessment Cervical Assessment:  (forward head) Thoracic Assessment Thoracic Assessment:  (rounded shoulders) Lumbar Assessment Lumbar Assessment:  (posterior pelvic tilt) Postural Control Postural Control: Within Functional Limits  Balance Balance Balance Assessed: Yes Static Sitting  Balance Static Sitting - Balance Support: Left upper extremity supported Static Sitting - Level of Assistance: 5: Stand by assistance Dynamic Sitting Balance Dynamic Sitting - Balance Support: During functional activity Dynamic Sitting - Level of Assistance: 5: Stand by assistance Static Standing Balance Static Standing - Balance Support: During functional activity Static Standing - Level of Assistance: 4: Min assist Dynamic  Standing Balance Dynamic Standing - Balance Support: During functional activity Dynamic Standing - Level of Assistance: 4: Min assist Extremity Assessment  RLE Assessment General Strength Comments: Grossly 4/5. Pain with resistance LLE Assessment General Strength Comments: grossly 4+/5.    [name], PT, DPT 10/31/2021, 3:19 PM

## 2021-10-31 NOTE — Progress Notes (Signed)
PROGRESS NOTE   Subjective/Complaints: Pt reported nausea last night. Nurse reports nausea med given with minimal relief. Pt tells me she feels better this morning. Had mushy medium sized bm this morning. Pt admits being nervous about going to next venue.   ROS: Patient denies fever, rash, sore throat, blurred vision,   diarrhea, cough, shortness of breath or chest pain, joint or back pain, headache, or mood change.   Objective:   DG Shoulder Right  Result Date: 10/30/2021 CLINICAL DATA:  Follow-up fracture EXAM: RIGHT SHOULDER - 2+ VIEW COMPARISON:  09/30/2021 FINDINGS: AC joint is intact. Comminuted fracture involving the right humeral neck and greater tuberosity with slight increased valgus angulation of the shaft. Suspicion of early periosteal new bone formation. There may be slight anterior inferior subluxation of the humeral head. IMPRESSION: 1. Comminuted fracture involving right humeral neck and greater tuberosity with slight increased angulation compared to prior, suspect early callus formation. 2. There may be slight anterior inferior subluxation of the humeral head. Electronically Signed   By: Donavan Foil M.D.   On: 10/30/2021 16:14   Recent Labs    10/30/21 0558  WBC 10.9*  HGB 8.7*  HCT 27.5*  PLT 222    Recent Labs    10/30/21 0558  NA 135  K 4.7  CL 109  CO2 20*  GLUCOSE 88  BUN 21  CREATININE 0.80  CALCIUM 8.2*    Intake/Output Summary (Last 24 hours) at 10/31/2021 8563 Last data filed at 10/30/2021 2030 Gross per 24 hour  Intake 657 ml  Output --  Net 657 ml     Pressure Injury 10/23/21 Elbow Posterior;Right Stage 2 -  Partial thickness loss of dermis presenting as a shallow open injury with a red, pink wound bed without slough. (Active)  10/23/21 Dorthula Nettles, RN3, [place]) [phone]  Location: Elbow  Location Orientation: Posterior;Right  Staging: Stage 2 -  Partial thickness loss of dermis  presenting as a shallow open injury with a red, pink wound bed without slough.  Wound Description (Comments):   Present on Admission:     Physical Exam: Vital Signs Blood pressure (!) 138/51, pulse 75, temperature 97.8 F (36.6 C), resp. rate 18, height 4\' 8"  (1.422 m), weight 53.6 kg, SpO2 98 %. Constitutional: No distress . Vital signs reviewed. HEENT: NCAT, EOMI, oral membranes moist Neck: supple Cardiovascular: RRR without murmur. No JVD    Respiratory/Chest: CTA Bilaterally without wheezes or rales. Normal effort    GI/Abdomen: BS +, non-tender, non-distended Ext: no clubbing, cyanosis, or edema Psych: pleasant and cooperative  Skin: Warm and dry.  Right elbow with dressing CDI. Right shin wound with staples, dry. All bruising much better Psych: Normal mood.  Normal behavior. Musc: Right elbow with mild edema and tenderness, stable. In sling. Neuro: Alert RUE: Elbow limited due to pain somewhat, otherwise grossly intact, stable  Assessment/Plan: 1. Functional deficits which require 3+ hours per day of interdisciplinary therapy in a comprehensive inpatient rehab setting. Physiatrist is providing close team supervision and 24 hour management of active medical problems listed below. Physiatrist and rehab team continue to assess barriers to discharge/monitor patient progress toward functional and medical goals  Care  Tool:  Bathing    Body parts bathed by patient: Chest, Abdomen, Face, Right arm, Front perineal area, Buttocks, Right upper leg, Left upper leg   Body parts bathed by helper: Left arm, Left lower leg, Right lower leg     Bathing assist Assist Level: Moderate Assistance - Patient 50 - 74%     Upper Body Dressing/Undressing Upper body dressing   What is the patient wearing?: Pull over shirt    Upper body assist Assist Level: Moderate Assistance - Patient 50 - 74%    Lower Body Dressing/Undressing Lower body dressing      What is the patient wearing?:  Incontinence brief, Pants     Lower body assist Assist for lower body dressing: Total Assistance - Patient < 25%     Toileting Toileting    Toileting assist Assist for toileting: Moderate Assistance - Patient 50 - 74%     Transfers Chair/bed transfer  Transfers assist     Chair/bed transfer assist level: Contact Guard/Touching assist Chair/bed transfer assistive device: Armrests, Cane   Locomotion Ambulation   Ambulation assist      Assist level: Minimal Assistance - Patient > 75% Assistive device: Cane-quad Max distance: 15ft   Walk 10 feet activity   Assist     Assist level: Moderate Assistance - Patient - 50 - 74% Assistive device: Hand held assist   Walk 50 feet activity   Assist Walk 50 feet with 2 turns activity did not occur: Safety/medical concerns         Walk 150 feet activity   Assist Walk 150 feet activity did not occur: Safety/medical concerns         Walk 10 feet on uneven surface  activity   Assist Walk 10 feet on uneven surfaces activity did not occur: Safety/medical concerns         Wheelchair     Assist Is the patient using a wheelchair?: Yes Type of Wheelchair: Manual    Wheelchair assist level: Dependent - Patient 0% Max wheelchair distance: 150'    Wheelchair 50 feet with 2 turns activity    Assist        Assist Level: Dependent - Patient 0%   Wheelchair 150 feet activity     Assist      Assist Level: Dependent - Patient 0%   Blood pressure (!) 138/51, pulse 75, temperature 97.8 F (36.6 C), resp. rate 18, height 4\' 8"  (1.422 m), weight 53.6 kg, SpO2 98 %.  Medical Problem List and Plan: 1. Functional deficits secondary to poly trauma, pelvic and humeral fracture  -dc to SNF today -needs f/u with ortho (2 weeks) and primary after SNF, -no [facility] needed   2.  Antithrombotics: -DVT/anticoagulation:  Mechanical: Sequential compression devices, below knee Bilateral lower extremities.               -antiplatelet therapy: N/A 3. Pain Management: Hydrocodone 3 times daily with as needed oxycodone for breakthrough pain  Controlled with meds on 12/4 4. Mood: LCSW to follow for evaluation and support             -antipsychotic agents: N/A 5. Neuropsych: This patient is capable of making decisions on her own behalf. 6. Skin/Wound Care: dc staples RLE 7. Fluids/Electrolytes/Nutrition: encouraging PO 8. PAF: Off eliquis. Monitor Heart rate TID on coreg BID. 9. HTN: Monitor BP -on Norvasc, Coreg and lisinopril Vitals:   10/30/21 1931 10/31/21 0543  BP: (!) 153/58 (!) 138/51  Pulse: 87 75  Resp:  18 18  Temp: 97.8 F (36.6 C) 97.8 F (36.6 C)  SpO2: 97% 98%  lisinopril held d/t hyperkalemia 11/30 increased Norvasc to 10mg  with improved bp 12/5 fair control 10. Hyperkalemia:   Potassium 4.7 on 12/5   11. PMR: Followed by Perc[facility] Spanish and has been managed with medrol 4 mg daily.  12. Right humerus fracture: ortho satisfied with appearance of fx, healing.   -may lift up to 2lbs, can use arms for transfers as tolerated  -AROM and PROM to pain tolerance  -ortho follow up in 2 wks. 13. Urinary retention: foley removed  -seems to emptying with low residuals 14. Constipation:  -continue senna-s 1 tab at hs, diet, enc liquids   Improving 15. Nausea: Continue zofran BID. Moving bowels fairly regularly. Might be anxiety component to it as well  16.  Leukocytosis: Resolved 17. AKI/hyponatremia:   -continue IV NS  QHS  12/5 BUN/creatinine improved  21/0.8   Sodium up to 135   -dc'ed iv, ivf   -will need to push her po intake  LOS: 13 days A FACE TO [place] 10/31/2021, 9:06 AM

## 2021-10-31 NOTE — Progress Notes (Signed)
Inpatient Rehabilitation Care Coordinator Discharge Note   Patient Details  Name: [name] MRN: [id number] Date of Birth: 1932/06/14   Discharge location: D/c to SNF-[facility]; Rm #[id number]  Length of Stay: 12 days  Discharge activity level: Mod A  Home/community participation: Limited  Patient response IH:[id number] Literacy - How often do you need to have someone help you when you read instructions, pamphlets, or other written material from your doctor or pharmacy?: Never  Patient response DG:LOVFIE Isolation - How often do you feel lonely or isolated from those around you?: Never  Services provided included: MD, RD, PT, OT, RN, CM, TR, Pharmacy, Neuropsych, SW  Financial Services:  Financial Services Utilized: Estate agent (secondary)  Choices offered to/list presented to: N/A  Follow-up services arranged:  N/A   Patient response to transportation need: Is the patient able to respond to transportation needs?: Yes In the past 12 months, has lack of transportation kept you from medical appointments or from getting medications?: No In the past 12 months, has lack of transportation kept you from meetings, work, or from getting things needed for daily living?: No   Comments (or additional information):  Patient/Family verbalized understanding of follow-up arrangements:  Yes  Individual responsible for coordination of the follow-up plan:    Confirmed correct DME delivered: Rana Snare 10/31/2021    Rana Snare

## 2021-10-31 NOTE — Progress Notes (Signed)
Patient reports nausea without emesis. Administered PRN nausea medication with minimal relief. Patient declines breakfast tray at this time.

## 2021-10-31 NOTE — Progress Notes (Signed)
Inpatient Rehabilitation Discharge Medication Review by a Pharmacist - Follow Up  A complete drug regimen review was completed for this patient to identify any potential clinically significant medication issues.  High Risk Drug Classes Is patient taking? Indication by Medication  Antipsychotic No   Anticoagulant No   Antibiotic No   Opioid Yes Hydrocodone-APAP for pain  Antiplatelet No   Hypoglycemics/insulin No   Vasoactive Medication Yes Amlodipine, coreg, hydralazine for HTN  Chemotherapy No   Other Yes Synthroid for hypothyroidism Atorvastatin for HLD Methylprednisolone for polymyalgia rheumatica Protonix- GERD Melatonin- sleep     Type of Medication Issue Identified Description of Issue Recommendation(s)  Drug Interaction(s) (clinically significant)     Duplicate Therapy     Allergy     No Medication Administration End Date     Incorrect Dose     Additional Drug Therapy Needed     Significant med changes from prior encounter (inform family/care partners about these prior to discharge).    Other       Clinically significant medication issues were identified that warrant physician communication and completion of prescribed/recommended actions by midnight of the next day:  No   Time spent performing this drug regimen review (minutes): 20  Mariea Mcmartin BS, PharmD, BCPS Clinical Pharmacist 10/31/2021 09:39 AM Please check AMION for all [place] phone numbers After 10:00 PM, call [place] [phone]

## 2021-11-01 DIAGNOSIS — I10 Essential (primary) hypertension: Secondary | ICD-10-CM | POA: Diagnosis not present

## 2021-11-01 DIAGNOSIS — E785 Hyperlipidemia, unspecified: Secondary | ICD-10-CM | POA: Diagnosis not present

## 2021-11-01 DIAGNOSIS — E039 Hypothyroidism, unspecified: Secondary | ICD-10-CM | POA: Diagnosis not present

## 2021-11-01 DIAGNOSIS — W19XXXD Unspecified fall, subsequent encounter: Secondary | ICD-10-CM | POA: Diagnosis not present

## 2021-11-01 DIAGNOSIS — S42201D Unspecified fracture of upper end of right humerus, subsequent encounter for fracture with routine healing: Secondary | ICD-10-CM | POA: Diagnosis not present

## 2021-11-01 DIAGNOSIS — D509 Iron deficiency anemia, unspecified: Secondary | ICD-10-CM | POA: Diagnosis not present

## 2021-11-01 DIAGNOSIS — S32501D Unspecified fracture of right pubis, subsequent encounter for fracture with routine healing: Secondary | ICD-10-CM | POA: Diagnosis not present

## 2021-11-16 DIAGNOSIS — R102 Pelvic and perineal pain: Secondary | ICD-10-CM | POA: Diagnosis not present

## 2021-11-16 DIAGNOSIS — S42201D Unspecified fracture of upper end of right humerus, subsequent encounter for fracture with routine healing: Secondary | ICD-10-CM | POA: Diagnosis not present

## 2021-11-16 DIAGNOSIS — M25511 Pain in right shoulder: Secondary | ICD-10-CM | POA: Diagnosis not present

## 2021-11-22 DIAGNOSIS — R195 Other fecal abnormalities: Secondary | ICD-10-CM | POA: Diagnosis not present

## 2021-11-22 DIAGNOSIS — R110 Nausea: Secondary | ICD-10-CM | POA: Diagnosis not present

## 2021-12-09 DIAGNOSIS — S42201D Unspecified fracture of upper end of right humerus, subsequent encounter for fracture with routine healing: Secondary | ICD-10-CM | POA: Diagnosis not present

## 2021-12-09 DIAGNOSIS — G8929 Other chronic pain: Secondary | ICD-10-CM | POA: Diagnosis not present

## 2021-12-09 DIAGNOSIS — S32501D Unspecified fracture of right pubis, subsequent encounter for fracture with routine healing: Secondary | ICD-10-CM | POA: Diagnosis not present

## 2021-12-09 DIAGNOSIS — J309 Allergic rhinitis, unspecified: Secondary | ICD-10-CM | POA: Diagnosis not present

## 2021-12-09 DIAGNOSIS — F419 Anxiety disorder, unspecified: Secondary | ICD-10-CM | POA: Diagnosis not present

## 2021-12-09 DIAGNOSIS — M545 Low back pain, unspecified: Secondary | ICD-10-CM | POA: Diagnosis not present

## 2021-12-09 DIAGNOSIS — R110 Nausea: Secondary | ICD-10-CM | POA: Diagnosis not present

## 2021-12-09 DIAGNOSIS — I480 Paroxysmal atrial fibrillation: Secondary | ICD-10-CM | POA: Diagnosis not present

## 2021-12-09 DIAGNOSIS — E7800 Pure hypercholesterolemia, unspecified: Secondary | ICD-10-CM | POA: Diagnosis not present

## 2021-12-09 DIAGNOSIS — N183 Chronic kidney disease, stage 3 unspecified: Secondary | ICD-10-CM | POA: Diagnosis not present

## 2021-12-09 DIAGNOSIS — W19XXXD Unspecified fall, subsequent encounter: Secondary | ICD-10-CM | POA: Diagnosis not present

## 2021-12-09 DIAGNOSIS — M353 Polymyalgia rheumatica: Secondary | ICD-10-CM | POA: Diagnosis not present

## 2021-12-09 DIAGNOSIS — I130 Hypertensive heart and chronic kidney disease with heart failure and stage 1 through stage 4 chronic kidney disease, or unspecified chronic kidney disease: Secondary | ICD-10-CM | POA: Diagnosis not present

## 2021-12-09 DIAGNOSIS — I255 Ischemic cardiomyopathy: Secondary | ICD-10-CM | POA: Diagnosis not present

## 2021-12-09 DIAGNOSIS — I501 Left ventricular failure: Secondary | ICD-10-CM | POA: Diagnosis not present

## 2021-12-09 DIAGNOSIS — K219 Gastro-esophageal reflux disease without esophagitis: Secondary | ICD-10-CM | POA: Diagnosis not present

## 2021-12-09 DIAGNOSIS — I5022 Chronic systolic (congestive) heart failure: Secondary | ICD-10-CM | POA: Diagnosis not present

## 2021-12-09 DIAGNOSIS — M810 Age-related osteoporosis without current pathological fracture: Secondary | ICD-10-CM | POA: Diagnosis not present

## 2021-12-09 DIAGNOSIS — Z7952 Long term (current) use of systemic steroids: Secondary | ICD-10-CM | POA: Diagnosis not present

## 2021-12-09 DIAGNOSIS — G4700 Insomnia, unspecified: Secondary | ICD-10-CM | POA: Diagnosis not present

## 2021-12-09 DIAGNOSIS — Z79891 Long term (current) use of opiate analgesic: Secondary | ICD-10-CM | POA: Diagnosis not present

## 2021-12-09 DIAGNOSIS — D509 Iron deficiency anemia, unspecified: Secondary | ICD-10-CM | POA: Diagnosis not present

## 2021-12-09 DIAGNOSIS — Z79899 Other long term (current) drug therapy: Secondary | ICD-10-CM | POA: Diagnosis not present

## 2021-12-09 DIAGNOSIS — I495 Sick sinus syndrome: Secondary | ICD-10-CM | POA: Diagnosis not present

## 2021-12-09 DIAGNOSIS — E039 Hypothyroidism, unspecified: Secondary | ICD-10-CM | POA: Diagnosis not present

## 2021-12-13 DIAGNOSIS — M810 Age-related osteoporosis without current pathological fracture: Secondary | ICD-10-CM | POA: Diagnosis not present

## 2021-12-13 DIAGNOSIS — I5022 Chronic systolic (congestive) heart failure: Secondary | ICD-10-CM | POA: Diagnosis not present

## 2021-12-13 DIAGNOSIS — S32501D Unspecified fracture of right pubis, subsequent encounter for fracture with routine healing: Secondary | ICD-10-CM | POA: Diagnosis not present

## 2021-12-13 DIAGNOSIS — I501 Left ventricular failure: Secondary | ICD-10-CM | POA: Diagnosis not present

## 2021-12-13 DIAGNOSIS — I130 Hypertensive heart and chronic kidney disease with heart failure and stage 1 through stage 4 chronic kidney disease, or unspecified chronic kidney disease: Secondary | ICD-10-CM | POA: Diagnosis not present

## 2021-12-13 DIAGNOSIS — S42201D Unspecified fracture of upper end of right humerus, subsequent encounter for fracture with routine healing: Secondary | ICD-10-CM | POA: Diagnosis not present

## 2021-12-14 DIAGNOSIS — M810 Age-related osteoporosis without current pathological fracture: Secondary | ICD-10-CM | POA: Diagnosis not present

## 2021-12-14 DIAGNOSIS — I5022 Chronic systolic (congestive) heart failure: Secondary | ICD-10-CM | POA: Diagnosis not present

## 2021-12-14 DIAGNOSIS — S32501D Unspecified fracture of right pubis, subsequent encounter for fracture with routine healing: Secondary | ICD-10-CM | POA: Diagnosis not present

## 2021-12-14 DIAGNOSIS — S42201D Unspecified fracture of upper end of right humerus, subsequent encounter for fracture with routine healing: Secondary | ICD-10-CM | POA: Diagnosis not present

## 2021-12-14 DIAGNOSIS — I130 Hypertensive heart and chronic kidney disease with heart failure and stage 1 through stage 4 chronic kidney disease, or unspecified chronic kidney disease: Secondary | ICD-10-CM | POA: Diagnosis not present

## 2021-12-14 DIAGNOSIS — I501 Left ventricular failure: Secondary | ICD-10-CM | POA: Diagnosis not present

## 2021-12-17 DIAGNOSIS — J984 Other disorders of lung: Secondary | ICD-10-CM | POA: Diagnosis not present

## 2021-12-17 DIAGNOSIS — Z743 Need for continuous supervision: Secondary | ICD-10-CM | POA: Diagnosis not present

## 2021-12-17 DIAGNOSIS — I480 Paroxysmal atrial fibrillation: Secondary | ICD-10-CM | POA: Diagnosis not present

## 2021-12-17 DIAGNOSIS — N3000 Acute cystitis without hematuria: Secondary | ICD-10-CM | POA: Diagnosis not present

## 2021-12-17 DIAGNOSIS — N3090 Cystitis, unspecified without hematuria: Secondary | ICD-10-CM | POA: Diagnosis not present

## 2021-12-17 DIAGNOSIS — Z882 Allergy status to sulfonamides status: Secondary | ICD-10-CM | POA: Diagnosis not present

## 2021-12-17 DIAGNOSIS — G8929 Other chronic pain: Secondary | ICD-10-CM | POA: Diagnosis not present

## 2021-12-17 DIAGNOSIS — Z888 Allergy status to other drugs, medicaments and biological substances status: Secondary | ICD-10-CM | POA: Diagnosis not present

## 2021-12-17 DIAGNOSIS — R531 Weakness: Secondary | ICD-10-CM | POA: Diagnosis not present

## 2021-12-17 DIAGNOSIS — Z20822 Contact with and (suspected) exposure to covid-19: Secondary | ICD-10-CM | POA: Diagnosis not present

## 2021-12-17 DIAGNOSIS — I517 Cardiomegaly: Secondary | ICD-10-CM | POA: Diagnosis not present

## 2021-12-17 DIAGNOSIS — Z95810 Presence of automatic (implantable) cardiac defibrillator: Secondary | ICD-10-CM | POA: Diagnosis not present

## 2021-12-17 DIAGNOSIS — R112 Nausea with vomiting, unspecified: Secondary | ICD-10-CM | POA: Diagnosis not present

## 2021-12-17 DIAGNOSIS — I129 Hypertensive chronic kidney disease with stage 1 through stage 4 chronic kidney disease, or unspecified chronic kidney disease: Secondary | ICD-10-CM | POA: Diagnosis not present

## 2021-12-17 DIAGNOSIS — R1111 Vomiting without nausea: Secondary | ICD-10-CM | POA: Diagnosis not present

## 2021-12-17 DIAGNOSIS — Z880 Allergy status to penicillin: Secondary | ICD-10-CM | POA: Diagnosis not present

## 2021-12-17 DIAGNOSIS — J90 Pleural effusion, not elsewhere classified: Secondary | ICD-10-CM | POA: Diagnosis not present

## 2021-12-17 DIAGNOSIS — Z7982 Long term (current) use of aspirin: Secondary | ICD-10-CM | POA: Diagnosis not present

## 2021-12-17 DIAGNOSIS — R0902 Hypoxemia: Secondary | ICD-10-CM | POA: Diagnosis not present

## 2021-12-17 DIAGNOSIS — N183 Chronic kidney disease, stage 3 unspecified: Secondary | ICD-10-CM | POA: Diagnosis not present

## 2021-12-17 DIAGNOSIS — R110 Nausea: Secondary | ICD-10-CM | POA: Diagnosis not present

## 2021-12-17 DIAGNOSIS — Z79899 Other long term (current) drug therapy: Secondary | ICD-10-CM | POA: Diagnosis not present

## 2021-12-19 DIAGNOSIS — I5022 Chronic systolic (congestive) heart failure: Secondary | ICD-10-CM | POA: Diagnosis not present

## 2021-12-19 DIAGNOSIS — S42201D Unspecified fracture of upper end of right humerus, subsequent encounter for fracture with routine healing: Secondary | ICD-10-CM | POA: Diagnosis not present

## 2021-12-19 DIAGNOSIS — I501 Left ventricular failure: Secondary | ICD-10-CM | POA: Diagnosis not present

## 2021-12-19 DIAGNOSIS — S32501D Unspecified fracture of right pubis, subsequent encounter for fracture with routine healing: Secondary | ICD-10-CM | POA: Diagnosis not present

## 2021-12-19 DIAGNOSIS — I130 Hypertensive heart and chronic kidney disease with heart failure and stage 1 through stage 4 chronic kidney disease, or unspecified chronic kidney disease: Secondary | ICD-10-CM | POA: Diagnosis not present

## 2021-12-19 DIAGNOSIS — M810 Age-related osteoporosis without current pathological fracture: Secondary | ICD-10-CM | POA: Diagnosis not present

## 2021-12-20 DIAGNOSIS — S32501D Unspecified fracture of right pubis, subsequent encounter for fracture with routine healing: Secondary | ICD-10-CM | POA: Diagnosis not present

## 2021-12-20 DIAGNOSIS — I501 Left ventricular failure: Secondary | ICD-10-CM | POA: Diagnosis not present

## 2021-12-20 DIAGNOSIS — M810 Age-related osteoporosis without current pathological fracture: Secondary | ICD-10-CM | POA: Diagnosis not present

## 2021-12-20 DIAGNOSIS — I5022 Chronic systolic (congestive) heart failure: Secondary | ICD-10-CM | POA: Diagnosis not present

## 2021-12-20 DIAGNOSIS — S42201D Unspecified fracture of upper end of right humerus, subsequent encounter for fracture with routine healing: Secondary | ICD-10-CM | POA: Diagnosis not present

## 2021-12-20 DIAGNOSIS — I130 Hypertensive heart and chronic kidney disease with heart failure and stage 1 through stage 4 chronic kidney disease, or unspecified chronic kidney disease: Secondary | ICD-10-CM | POA: Diagnosis not present

## 2021-12-22 DIAGNOSIS — R630 Anorexia: Secondary | ICD-10-CM | POA: Diagnosis not present

## 2021-12-22 DIAGNOSIS — I10 Essential (primary) hypertension: Secondary | ICD-10-CM | POA: Diagnosis not present

## 2021-12-22 DIAGNOSIS — Z8781 Personal history of (healed) traumatic fracture: Secondary | ICD-10-CM | POA: Diagnosis not present

## 2021-12-22 DIAGNOSIS — S42201D Unspecified fracture of upper end of right humerus, subsequent encounter for fracture with routine healing: Secondary | ICD-10-CM | POA: Diagnosis not present

## 2021-12-22 DIAGNOSIS — I5022 Chronic systolic (congestive) heart failure: Secondary | ICD-10-CM | POA: Diagnosis not present

## 2021-12-22 DIAGNOSIS — M810 Age-related osteoporosis without current pathological fracture: Secondary | ICD-10-CM | POA: Diagnosis not present

## 2021-12-22 DIAGNOSIS — Z66 Do not resuscitate: Secondary | ICD-10-CM | POA: Diagnosis not present

## 2021-12-22 DIAGNOSIS — I501 Left ventricular failure: Secondary | ICD-10-CM | POA: Diagnosis not present

## 2021-12-22 DIAGNOSIS — I130 Hypertensive heart and chronic kidney disease with heart failure and stage 1 through stage 4 chronic kidney disease, or unspecified chronic kidney disease: Secondary | ICD-10-CM | POA: Diagnosis not present

## 2021-12-22 DIAGNOSIS — S32501D Unspecified fracture of right pubis, subsequent encounter for fracture with routine healing: Secondary | ICD-10-CM | POA: Diagnosis not present

## 2021-12-22 DIAGNOSIS — Z09 Encounter for follow-up examination after completed treatment for conditions other than malignant neoplasm: Secondary | ICD-10-CM | POA: Diagnosis not present

## 2021-12-22 DIAGNOSIS — Z9181 History of falling: Secondary | ICD-10-CM | POA: Diagnosis not present

## 2021-12-22 DIAGNOSIS — Z7189 Other specified counseling: Secondary | ICD-10-CM | POA: Diagnosis not present

## 2021-12-22 DIAGNOSIS — N183 Chronic kidney disease, stage 3 unspecified: Secondary | ICD-10-CM | POA: Diagnosis not present

## 2021-12-22 DIAGNOSIS — D509 Iron deficiency anemia, unspecified: Secondary | ICD-10-CM | POA: Diagnosis not present

## 2021-12-22 DIAGNOSIS — R110 Nausea: Secondary | ICD-10-CM | POA: Diagnosis not present

## 2021-12-22 DIAGNOSIS — R609 Edema, unspecified: Secondary | ICD-10-CM | POA: Diagnosis not present

## 2021-12-23 DIAGNOSIS — S42201D Unspecified fracture of upper end of right humerus, subsequent encounter for fracture with routine healing: Secondary | ICD-10-CM | POA: Diagnosis not present

## 2021-12-23 DIAGNOSIS — I5022 Chronic systolic (congestive) heart failure: Secondary | ICD-10-CM | POA: Diagnosis not present

## 2021-12-23 DIAGNOSIS — I130 Hypertensive heart and chronic kidney disease with heart failure and stage 1 through stage 4 chronic kidney disease, or unspecified chronic kidney disease: Secondary | ICD-10-CM | POA: Diagnosis not present

## 2021-12-23 DIAGNOSIS — I501 Left ventricular failure: Secondary | ICD-10-CM | POA: Diagnosis not present

## 2021-12-23 DIAGNOSIS — S32501D Unspecified fracture of right pubis, subsequent encounter for fracture with routine healing: Secondary | ICD-10-CM | POA: Diagnosis not present

## 2021-12-23 DIAGNOSIS — M810 Age-related osteoporosis without current pathological fracture: Secondary | ICD-10-CM | POA: Diagnosis not present

## 2021-12-25 DIAGNOSIS — S32501D Unspecified fracture of right pubis, subsequent encounter for fracture with routine healing: Secondary | ICD-10-CM | POA: Diagnosis not present

## 2021-12-25 DIAGNOSIS — M810 Age-related osteoporosis without current pathological fracture: Secondary | ICD-10-CM | POA: Diagnosis not present

## 2021-12-25 DIAGNOSIS — I501 Left ventricular failure: Secondary | ICD-10-CM | POA: Diagnosis not present

## 2021-12-25 DIAGNOSIS — I130 Hypertensive heart and chronic kidney disease with heart failure and stage 1 through stage 4 chronic kidney disease, or unspecified chronic kidney disease: Secondary | ICD-10-CM | POA: Diagnosis not present

## 2021-12-25 DIAGNOSIS — I5022 Chronic systolic (congestive) heart failure: Secondary | ICD-10-CM | POA: Diagnosis not present

## 2021-12-25 DIAGNOSIS — S42201D Unspecified fracture of upper end of right humerus, subsequent encounter for fracture with routine healing: Secondary | ICD-10-CM | POA: Diagnosis not present

## 2021-12-26 DIAGNOSIS — M810 Age-related osteoporosis without current pathological fracture: Secondary | ICD-10-CM | POA: Diagnosis not present

## 2021-12-26 DIAGNOSIS — I501 Left ventricular failure: Secondary | ICD-10-CM | POA: Diagnosis not present

## 2021-12-26 DIAGNOSIS — S42201D Unspecified fracture of upper end of right humerus, subsequent encounter for fracture with routine healing: Secondary | ICD-10-CM | POA: Diagnosis not present

## 2021-12-26 DIAGNOSIS — I5022 Chronic systolic (congestive) heart failure: Secondary | ICD-10-CM | POA: Diagnosis not present

## 2021-12-26 DIAGNOSIS — S32501D Unspecified fracture of right pubis, subsequent encounter for fracture with routine healing: Secondary | ICD-10-CM | POA: Diagnosis not present

## 2021-12-26 DIAGNOSIS — I130 Hypertensive heart and chronic kidney disease with heart failure and stage 1 through stage 4 chronic kidney disease, or unspecified chronic kidney disease: Secondary | ICD-10-CM | POA: Diagnosis not present

## 2021-12-27 DIAGNOSIS — I5022 Chronic systolic (congestive) heart failure: Secondary | ICD-10-CM | POA: Diagnosis not present

## 2021-12-27 DIAGNOSIS — S42201D Unspecified fracture of upper end of right humerus, subsequent encounter for fracture with routine healing: Secondary | ICD-10-CM | POA: Diagnosis not present

## 2021-12-27 DIAGNOSIS — M810 Age-related osteoporosis without current pathological fracture: Secondary | ICD-10-CM | POA: Diagnosis not present

## 2021-12-27 DIAGNOSIS — S32501D Unspecified fracture of right pubis, subsequent encounter for fracture with routine healing: Secondary | ICD-10-CM | POA: Diagnosis not present

## 2021-12-27 DIAGNOSIS — I130 Hypertensive heart and chronic kidney disease with heart failure and stage 1 through stage 4 chronic kidney disease, or unspecified chronic kidney disease: Secondary | ICD-10-CM | POA: Diagnosis not present

## 2021-12-27 DIAGNOSIS — I501 Left ventricular failure: Secondary | ICD-10-CM | POA: Diagnosis not present

## 2021-12-28 DIAGNOSIS — I5022 Chronic systolic (congestive) heart failure: Secondary | ICD-10-CM | POA: Diagnosis not present

## 2021-12-28 DIAGNOSIS — M810 Age-related osteoporosis without current pathological fracture: Secondary | ICD-10-CM | POA: Diagnosis not present

## 2021-12-28 DIAGNOSIS — I130 Hypertensive heart and chronic kidney disease with heart failure and stage 1 through stage 4 chronic kidney disease, or unspecified chronic kidney disease: Secondary | ICD-10-CM | POA: Diagnosis not present

## 2021-12-28 DIAGNOSIS — I501 Left ventricular failure: Secondary | ICD-10-CM | POA: Diagnosis not present

## 2021-12-28 DIAGNOSIS — S32501D Unspecified fracture of right pubis, subsequent encounter for fracture with routine healing: Secondary | ICD-10-CM | POA: Diagnosis not present

## 2021-12-28 DIAGNOSIS — S42201D Unspecified fracture of upper end of right humerus, subsequent encounter for fracture with routine healing: Secondary | ICD-10-CM | POA: Diagnosis not present

## 2021-12-29 DIAGNOSIS — S42201D Unspecified fracture of upper end of right humerus, subsequent encounter for fracture with routine healing: Secondary | ICD-10-CM | POA: Diagnosis not present

## 2021-12-29 DIAGNOSIS — M810 Age-related osteoporosis without current pathological fracture: Secondary | ICD-10-CM | POA: Diagnosis not present

## 2021-12-29 DIAGNOSIS — S32501D Unspecified fracture of right pubis, subsequent encounter for fracture with routine healing: Secondary | ICD-10-CM | POA: Diagnosis not present

## 2021-12-29 DIAGNOSIS — I5022 Chronic systolic (congestive) heart failure: Secondary | ICD-10-CM | POA: Diagnosis not present

## 2021-12-29 DIAGNOSIS — I501 Left ventricular failure: Secondary | ICD-10-CM | POA: Diagnosis not present

## 2021-12-29 DIAGNOSIS — I130 Hypertensive heart and chronic kidney disease with heart failure and stage 1 through stage 4 chronic kidney disease, or unspecified chronic kidney disease: Secondary | ICD-10-CM | POA: Diagnosis not present

## 2022-01-01 DIAGNOSIS — S42201D Unspecified fracture of upper end of right humerus, subsequent encounter for fracture with routine healing: Secondary | ICD-10-CM | POA: Diagnosis not present

## 2022-01-01 DIAGNOSIS — S32501D Unspecified fracture of right pubis, subsequent encounter for fracture with routine healing: Secondary | ICD-10-CM | POA: Diagnosis not present

## 2022-01-01 DIAGNOSIS — I5022 Chronic systolic (congestive) heart failure: Secondary | ICD-10-CM | POA: Diagnosis not present

## 2022-01-01 DIAGNOSIS — M810 Age-related osteoporosis without current pathological fracture: Secondary | ICD-10-CM | POA: Diagnosis not present

## 2022-01-01 DIAGNOSIS — I130 Hypertensive heart and chronic kidney disease with heart failure and stage 1 through stage 4 chronic kidney disease, or unspecified chronic kidney disease: Secondary | ICD-10-CM | POA: Diagnosis not present

## 2022-01-01 DIAGNOSIS — I501 Left ventricular failure: Secondary | ICD-10-CM | POA: Diagnosis not present

## 2022-01-02 DIAGNOSIS — I5022 Chronic systolic (congestive) heart failure: Secondary | ICD-10-CM | POA: Diagnosis not present

## 2022-01-02 DIAGNOSIS — M810 Age-related osteoporosis without current pathological fracture: Secondary | ICD-10-CM | POA: Diagnosis not present

## 2022-01-02 DIAGNOSIS — I501 Left ventricular failure: Secondary | ICD-10-CM | POA: Diagnosis not present

## 2022-01-02 DIAGNOSIS — I130 Hypertensive heart and chronic kidney disease with heart failure and stage 1 through stage 4 chronic kidney disease, or unspecified chronic kidney disease: Secondary | ICD-10-CM | POA: Diagnosis not present

## 2022-01-02 DIAGNOSIS — S32501D Unspecified fracture of right pubis, subsequent encounter for fracture with routine healing: Secondary | ICD-10-CM | POA: Diagnosis not present

## 2022-01-02 DIAGNOSIS — S42201D Unspecified fracture of upper end of right humerus, subsequent encounter for fracture with routine healing: Secondary | ICD-10-CM | POA: Diagnosis not present

## 2022-01-03 DIAGNOSIS — I501 Left ventricular failure: Secondary | ICD-10-CM | POA: Diagnosis not present

## 2022-01-03 DIAGNOSIS — I5022 Chronic systolic (congestive) heart failure: Secondary | ICD-10-CM | POA: Diagnosis not present

## 2022-01-03 DIAGNOSIS — I130 Hypertensive heart and chronic kidney disease with heart failure and stage 1 through stage 4 chronic kidney disease, or unspecified chronic kidney disease: Secondary | ICD-10-CM | POA: Diagnosis not present

## 2022-01-03 DIAGNOSIS — S42201D Unspecified fracture of upper end of right humerus, subsequent encounter for fracture with routine healing: Secondary | ICD-10-CM | POA: Diagnosis not present

## 2022-01-03 DIAGNOSIS — M810 Age-related osteoporosis without current pathological fracture: Secondary | ICD-10-CM | POA: Diagnosis not present

## 2022-01-03 DIAGNOSIS — S32501D Unspecified fracture of right pubis, subsequent encounter for fracture with routine healing: Secondary | ICD-10-CM | POA: Diagnosis not present

## 2022-01-04 DIAGNOSIS — S42201D Unspecified fracture of upper end of right humerus, subsequent encounter for fracture with routine healing: Secondary | ICD-10-CM | POA: Diagnosis not present

## 2022-01-04 DIAGNOSIS — S32501D Unspecified fracture of right pubis, subsequent encounter for fracture with routine healing: Secondary | ICD-10-CM | POA: Diagnosis not present

## 2022-01-04 DIAGNOSIS — M810 Age-related osteoporosis without current pathological fracture: Secondary | ICD-10-CM | POA: Diagnosis not present

## 2022-01-04 DIAGNOSIS — I501 Left ventricular failure: Secondary | ICD-10-CM | POA: Diagnosis not present

## 2022-01-04 DIAGNOSIS — I5022 Chronic systolic (congestive) heart failure: Secondary | ICD-10-CM | POA: Diagnosis not present

## 2022-01-04 DIAGNOSIS — I130 Hypertensive heart and chronic kidney disease with heart failure and stage 1 through stage 4 chronic kidney disease, or unspecified chronic kidney disease: Secondary | ICD-10-CM | POA: Diagnosis not present

## 2022-01-08 DIAGNOSIS — D509 Iron deficiency anemia, unspecified: Secondary | ICD-10-CM | POA: Diagnosis not present

## 2022-01-08 DIAGNOSIS — J309 Allergic rhinitis, unspecified: Secondary | ICD-10-CM | POA: Diagnosis not present

## 2022-01-08 DIAGNOSIS — E039 Hypothyroidism, unspecified: Secondary | ICD-10-CM | POA: Diagnosis not present

## 2022-01-08 DIAGNOSIS — F419 Anxiety disorder, unspecified: Secondary | ICD-10-CM | POA: Diagnosis not present

## 2022-01-08 DIAGNOSIS — I480 Paroxysmal atrial fibrillation: Secondary | ICD-10-CM | POA: Diagnosis not present

## 2022-01-08 DIAGNOSIS — M810 Age-related osteoporosis without current pathological fracture: Secondary | ICD-10-CM | POA: Diagnosis not present

## 2022-01-08 DIAGNOSIS — I255 Ischemic cardiomyopathy: Secondary | ICD-10-CM | POA: Diagnosis not present

## 2022-01-08 DIAGNOSIS — I501 Left ventricular failure: Secondary | ICD-10-CM | POA: Diagnosis not present

## 2022-01-08 DIAGNOSIS — Z7952 Long term (current) use of systemic steroids: Secondary | ICD-10-CM | POA: Diagnosis not present

## 2022-01-08 DIAGNOSIS — I5022 Chronic systolic (congestive) heart failure: Secondary | ICD-10-CM | POA: Diagnosis not present

## 2022-01-08 DIAGNOSIS — W19XXXD Unspecified fall, subsequent encounter: Secondary | ICD-10-CM | POA: Diagnosis not present

## 2022-01-08 DIAGNOSIS — E7800 Pure hypercholesterolemia, unspecified: Secondary | ICD-10-CM | POA: Diagnosis not present

## 2022-01-08 DIAGNOSIS — K219 Gastro-esophageal reflux disease without esophagitis: Secondary | ICD-10-CM | POA: Diagnosis not present

## 2022-01-08 DIAGNOSIS — M545 Low back pain, unspecified: Secondary | ICD-10-CM | POA: Diagnosis not present

## 2022-01-08 DIAGNOSIS — G4700 Insomnia, unspecified: Secondary | ICD-10-CM | POA: Diagnosis not present

## 2022-01-08 DIAGNOSIS — G8929 Other chronic pain: Secondary | ICD-10-CM | POA: Diagnosis not present

## 2022-01-08 DIAGNOSIS — I130 Hypertensive heart and chronic kidney disease with heart failure and stage 1 through stage 4 chronic kidney disease, or unspecified chronic kidney disease: Secondary | ICD-10-CM | POA: Diagnosis not present

## 2022-01-08 DIAGNOSIS — N183 Chronic kidney disease, stage 3 unspecified: Secondary | ICD-10-CM | POA: Diagnosis not present

## 2022-01-08 DIAGNOSIS — Z79899 Other long term (current) drug therapy: Secondary | ICD-10-CM | POA: Diagnosis not present

## 2022-01-08 DIAGNOSIS — Z79891 Long term (current) use of opiate analgesic: Secondary | ICD-10-CM | POA: Diagnosis not present

## 2022-01-08 DIAGNOSIS — M353 Polymyalgia rheumatica: Secondary | ICD-10-CM | POA: Diagnosis not present

## 2022-01-08 DIAGNOSIS — S42201D Unspecified fracture of upper end of right humerus, subsequent encounter for fracture with routine healing: Secondary | ICD-10-CM | POA: Diagnosis not present

## 2022-01-08 DIAGNOSIS — R110 Nausea: Secondary | ICD-10-CM | POA: Diagnosis not present

## 2022-01-08 DIAGNOSIS — I495 Sick sinus syndrome: Secondary | ICD-10-CM | POA: Diagnosis not present

## 2022-01-08 DIAGNOSIS — S32501D Unspecified fracture of right pubis, subsequent encounter for fracture with routine healing: Secondary | ICD-10-CM | POA: Diagnosis not present

## 2022-01-09 DIAGNOSIS — K8689 Other specified diseases of pancreas: Secondary | ICD-10-CM | POA: Diagnosis not present

## 2022-01-09 DIAGNOSIS — R339 Retention of urine, unspecified: Secondary | ICD-10-CM | POA: Diagnosis not present

## 2022-01-09 DIAGNOSIS — I480 Paroxysmal atrial fibrillation: Secondary | ICD-10-CM | POA: Diagnosis not present

## 2022-01-09 DIAGNOSIS — R1032 Left lower quadrant pain: Secondary | ICD-10-CM | POA: Diagnosis not present

## 2022-01-09 DIAGNOSIS — K5903 Drug induced constipation: Secondary | ICD-10-CM | POA: Diagnosis not present

## 2022-01-09 DIAGNOSIS — N189 Chronic kidney disease, unspecified: Secondary | ICD-10-CM | POA: Diagnosis not present

## 2022-01-09 DIAGNOSIS — N3289 Other specified disorders of bladder: Secondary | ICD-10-CM | POA: Diagnosis not present

## 2022-01-09 DIAGNOSIS — I130 Hypertensive heart and chronic kidney disease with heart failure and stage 1 through stage 4 chronic kidney disease, or unspecified chronic kidney disease: Secondary | ICD-10-CM | POA: Diagnosis not present

## 2022-01-09 DIAGNOSIS — T402X5A Adverse effect of other opioids, initial encounter: Secondary | ICD-10-CM | POA: Diagnosis not present

## 2022-01-09 DIAGNOSIS — Z7982 Long term (current) use of aspirin: Secondary | ICD-10-CM | POA: Diagnosis not present

## 2022-01-09 DIAGNOSIS — K5900 Constipation, unspecified: Secondary | ICD-10-CM | POA: Diagnosis not present

## 2022-01-09 DIAGNOSIS — R54 Age-related physical debility: Secondary | ICD-10-CM | POA: Diagnosis not present

## 2022-01-09 DIAGNOSIS — Z882 Allergy status to sulfonamides status: Secondary | ICD-10-CM | POA: Diagnosis not present

## 2022-01-09 DIAGNOSIS — I5032 Chronic diastolic (congestive) heart failure: Secondary | ICD-10-CM | POA: Diagnosis not present

## 2022-01-09 DIAGNOSIS — Z79899 Other long term (current) drug therapy: Secondary | ICD-10-CM | POA: Diagnosis not present

## 2022-01-09 DIAGNOSIS — Z887 Allergy status to serum and vaccine status: Secondary | ICD-10-CM | POA: Diagnosis not present

## 2022-01-09 DIAGNOSIS — K5730 Diverticulosis of large intestine without perforation or abscess without bleeding: Secondary | ICD-10-CM | POA: Diagnosis not present

## 2022-01-10 DIAGNOSIS — I5022 Chronic systolic (congestive) heart failure: Secondary | ICD-10-CM | POA: Diagnosis not present

## 2022-01-10 DIAGNOSIS — I130 Hypertensive heart and chronic kidney disease with heart failure and stage 1 through stage 4 chronic kidney disease, or unspecified chronic kidney disease: Secondary | ICD-10-CM | POA: Diagnosis not present

## 2022-01-10 DIAGNOSIS — S32501D Unspecified fracture of right pubis, subsequent encounter for fracture with routine healing: Secondary | ICD-10-CM | POA: Diagnosis not present

## 2022-01-10 DIAGNOSIS — M810 Age-related osteoporosis without current pathological fracture: Secondary | ICD-10-CM | POA: Diagnosis not present

## 2022-01-10 DIAGNOSIS — S42201D Unspecified fracture of upper end of right humerus, subsequent encounter for fracture with routine healing: Secondary | ICD-10-CM | POA: Diagnosis not present

## 2022-01-10 DIAGNOSIS — I501 Left ventricular failure: Secondary | ICD-10-CM | POA: Diagnosis not present

## 2022-01-12 DIAGNOSIS — I5022 Chronic systolic (congestive) heart failure: Secondary | ICD-10-CM | POA: Diagnosis not present

## 2022-01-12 DIAGNOSIS — S42201D Unspecified fracture of upper end of right humerus, subsequent encounter for fracture with routine healing: Secondary | ICD-10-CM | POA: Diagnosis not present

## 2022-01-12 DIAGNOSIS — I130 Hypertensive heart and chronic kidney disease with heart failure and stage 1 through stage 4 chronic kidney disease, or unspecified chronic kidney disease: Secondary | ICD-10-CM | POA: Diagnosis not present

## 2022-01-12 DIAGNOSIS — M810 Age-related osteoporosis without current pathological fracture: Secondary | ICD-10-CM | POA: Diagnosis not present

## 2022-01-12 DIAGNOSIS — I501 Left ventricular failure: Secondary | ICD-10-CM | POA: Diagnosis not present

## 2022-01-12 DIAGNOSIS — S32501D Unspecified fracture of right pubis, subsequent encounter for fracture with routine healing: Secondary | ICD-10-CM | POA: Diagnosis not present

## 2022-01-17 DIAGNOSIS — I130 Hypertensive heart and chronic kidney disease with heart failure and stage 1 through stage 4 chronic kidney disease, or unspecified chronic kidney disease: Secondary | ICD-10-CM | POA: Diagnosis not present

## 2022-01-17 DIAGNOSIS — I5022 Chronic systolic (congestive) heart failure: Secondary | ICD-10-CM | POA: Diagnosis not present

## 2022-01-17 DIAGNOSIS — S32501D Unspecified fracture of right pubis, subsequent encounter for fracture with routine healing: Secondary | ICD-10-CM | POA: Diagnosis not present

## 2022-01-17 DIAGNOSIS — S42201D Unspecified fracture of upper end of right humerus, subsequent encounter for fracture with routine healing: Secondary | ICD-10-CM | POA: Diagnosis not present

## 2022-01-17 DIAGNOSIS — M810 Age-related osteoporosis without current pathological fracture: Secondary | ICD-10-CM | POA: Diagnosis not present

## 2022-01-17 DIAGNOSIS — I501 Left ventricular failure: Secondary | ICD-10-CM | POA: Diagnosis not present

## 2022-01-18 DIAGNOSIS — I501 Left ventricular failure: Secondary | ICD-10-CM | POA: Diagnosis not present

## 2022-01-18 DIAGNOSIS — I130 Hypertensive heart and chronic kidney disease with heart failure and stage 1 through stage 4 chronic kidney disease, or unspecified chronic kidney disease: Secondary | ICD-10-CM | POA: Diagnosis not present

## 2022-01-18 DIAGNOSIS — I5022 Chronic systolic (congestive) heart failure: Secondary | ICD-10-CM | POA: Diagnosis not present

## 2022-01-18 DIAGNOSIS — M810 Age-related osteoporosis without current pathological fracture: Secondary | ICD-10-CM | POA: Diagnosis not present

## 2022-01-18 DIAGNOSIS — S42201D Unspecified fracture of upper end of right humerus, subsequent encounter for fracture with routine healing: Secondary | ICD-10-CM | POA: Diagnosis not present

## 2022-01-18 DIAGNOSIS — S32501D Unspecified fracture of right pubis, subsequent encounter for fracture with routine healing: Secondary | ICD-10-CM | POA: Diagnosis not present

## 2022-01-23 DIAGNOSIS — E782 Mixed hyperlipidemia: Secondary | ICD-10-CM | POA: Diagnosis not present

## 2022-01-23 DIAGNOSIS — Z0189 Encounter for other specified special examinations: Secondary | ICD-10-CM | POA: Diagnosis not present

## 2022-01-23 DIAGNOSIS — K5900 Constipation, unspecified: Secondary | ICD-10-CM | POA: Diagnosis not present

## 2022-01-23 DIAGNOSIS — E039 Hypothyroidism, unspecified: Secondary | ICD-10-CM | POA: Diagnosis not present

## 2022-01-23 DIAGNOSIS — I10 Essential (primary) hypertension: Secondary | ICD-10-CM | POA: Diagnosis not present

## 2022-01-23 DIAGNOSIS — I509 Heart failure, unspecified: Secondary | ICD-10-CM | POA: Diagnosis not present

## 2022-02-01 DIAGNOSIS — R799 Abnormal finding of blood chemistry, unspecified: Secondary | ICD-10-CM | POA: Diagnosis not present

## 2022-02-02 DIAGNOSIS — E876 Hypokalemia: Secondary | ICD-10-CM | POA: Diagnosis not present

## 2022-02-02 DIAGNOSIS — R609 Edema, unspecified: Secondary | ICD-10-CM | POA: Diagnosis not present

## 2022-02-02 DIAGNOSIS — D72829 Elevated white blood cell count, unspecified: Secondary | ICD-10-CM | POA: Diagnosis not present

## 2022-02-02 DIAGNOSIS — R5381 Other malaise: Secondary | ICD-10-CM | POA: Diagnosis not present

## 2022-02-02 DIAGNOSIS — N179 Acute kidney failure, unspecified: Secondary | ICD-10-CM | POA: Diagnosis not present

## 2022-02-02 DIAGNOSIS — T7840XA Allergy, unspecified, initial encounter: Secondary | ICD-10-CM | POA: Diagnosis not present

## 2022-02-02 DIAGNOSIS — I5032 Chronic diastolic (congestive) heart failure: Secondary | ICD-10-CM | POA: Diagnosis not present

## 2022-02-02 DIAGNOSIS — R269 Unspecified abnormalities of gait and mobility: Secondary | ICD-10-CM | POA: Diagnosis not present

## 2022-02-02 DIAGNOSIS — I480 Paroxysmal atrial fibrillation: Secondary | ICD-10-CM | POA: Diagnosis not present

## 2022-02-02 DIAGNOSIS — G40909 Epilepsy, unspecified, not intractable, without status epilepticus: Secondary | ICD-10-CM | POA: Diagnosis not present

## 2022-02-02 DIAGNOSIS — I150 Renovascular hypertension: Secondary | ICD-10-CM | POA: Diagnosis not present

## 2022-02-02 DIAGNOSIS — S22000A Wedge compression fracture of unspecified thoracic vertebra, initial encounter for closed fracture: Secondary | ICD-10-CM | POA: Diagnosis not present

## 2022-02-02 DIAGNOSIS — R195 Other fecal abnormalities: Secondary | ICD-10-CM | POA: Diagnosis not present

## 2022-02-02 DIAGNOSIS — E039 Hypothyroidism, unspecified: Secondary | ICD-10-CM | POA: Diagnosis not present

## 2022-02-02 DIAGNOSIS — D649 Anemia, unspecified: Secondary | ICD-10-CM | POA: Diagnosis not present

## 2022-02-03 DIAGNOSIS — K5900 Constipation, unspecified: Secondary | ICD-10-CM | POA: Diagnosis not present

## 2022-02-13 DIAGNOSIS — R946 Abnormal results of thyroid function studies: Secondary | ICD-10-CM | POA: Diagnosis not present

## 2022-02-13 DIAGNOSIS — D519 Vitamin B12 deficiency anemia, unspecified: Secondary | ICD-10-CM | POA: Diagnosis not present

## 2022-02-13 DIAGNOSIS — R7989 Other specified abnormal findings of blood chemistry: Secondary | ICD-10-CM | POA: Diagnosis not present

## 2022-02-13 DIAGNOSIS — R799 Abnormal finding of blood chemistry, unspecified: Secondary | ICD-10-CM | POA: Diagnosis not present

## 2022-02-18 DIAGNOSIS — M7989 Other specified soft tissue disorders: Secondary | ICD-10-CM | POA: Diagnosis not present

## 2022-02-20 DIAGNOSIS — N1832 Chronic kidney disease, stage 3b: Secondary | ICD-10-CM | POA: Diagnosis not present

## 2022-02-20 DIAGNOSIS — R609 Edema, unspecified: Secondary | ICD-10-CM | POA: Diagnosis not present

## 2022-02-20 DIAGNOSIS — I10 Essential (primary) hypertension: Secondary | ICD-10-CM | POA: Diagnosis not present

## 2022-02-20 DIAGNOSIS — D72829 Elevated white blood cell count, unspecified: Secondary | ICD-10-CM | POA: Diagnosis not present

## 2022-02-20 DIAGNOSIS — I5032 Chronic diastolic (congestive) heart failure: Secondary | ICD-10-CM | POA: Diagnosis not present

## 2022-02-20 DIAGNOSIS — E039 Hypothyroidism, unspecified: Secondary | ICD-10-CM | POA: Diagnosis not present

## 2022-02-21 DIAGNOSIS — M7989 Other specified soft tissue disorders: Secondary | ICD-10-CM | POA: Diagnosis not present

## 2022-02-26 DIAGNOSIS — M6281 Muscle weakness (generalized): Secondary | ICD-10-CM | POA: Diagnosis not present

## 2022-02-26 DIAGNOSIS — R262 Difficulty in walking, not elsewhere classified: Secondary | ICD-10-CM | POA: Diagnosis not present

## 2022-02-26 DIAGNOSIS — R2681 Unsteadiness on feet: Secondary | ICD-10-CM | POA: Diagnosis not present

## 2022-02-26 DIAGNOSIS — M6259 Muscle wasting and atrophy, not elsewhere classified, multiple sites: Secondary | ICD-10-CM | POA: Diagnosis not present

## 2022-02-26 DIAGNOSIS — R296 Repeated falls: Secondary | ICD-10-CM | POA: Diagnosis not present

## 2022-02-26 DIAGNOSIS — M816 Localized osteoporosis [Lequesne]: Secondary | ICD-10-CM | POA: Diagnosis not present

## 2022-02-27 DIAGNOSIS — N1832 Chronic kidney disease, stage 3b: Secondary | ICD-10-CM | POA: Diagnosis not present

## 2022-02-27 DIAGNOSIS — I5032 Chronic diastolic (congestive) heart failure: Secondary | ICD-10-CM | POA: Diagnosis not present

## 2022-02-27 DIAGNOSIS — R7989 Other specified abnormal findings of blood chemistry: Secondary | ICD-10-CM | POA: Diagnosis not present

## 2022-02-27 DIAGNOSIS — I10 Essential (primary) hypertension: Secondary | ICD-10-CM | POA: Diagnosis not present

## 2022-02-27 DIAGNOSIS — H5789 Other specified disorders of eye and adnexa: Secondary | ICD-10-CM | POA: Diagnosis not present

## 2022-02-27 DIAGNOSIS — R799 Abnormal finding of blood chemistry, unspecified: Secondary | ICD-10-CM | POA: Diagnosis not present

## 2022-02-28 DIAGNOSIS — M816 Localized osteoporosis [Lequesne]: Secondary | ICD-10-CM | POA: Diagnosis not present

## 2022-02-28 DIAGNOSIS — M6259 Muscle wasting and atrophy, not elsewhere classified, multiple sites: Secondary | ICD-10-CM | POA: Diagnosis not present

## 2022-02-28 DIAGNOSIS — R296 Repeated falls: Secondary | ICD-10-CM | POA: Diagnosis not present

## 2022-02-28 DIAGNOSIS — R262 Difficulty in walking, not elsewhere classified: Secondary | ICD-10-CM | POA: Diagnosis not present

## 2022-02-28 DIAGNOSIS — M6281 Muscle weakness (generalized): Secondary | ICD-10-CM | POA: Diagnosis not present

## 2022-02-28 DIAGNOSIS — R2681 Unsteadiness on feet: Secondary | ICD-10-CM | POA: Diagnosis not present

## 2022-03-02 DIAGNOSIS — I150 Renovascular hypertension: Secondary | ICD-10-CM | POA: Diagnosis not present

## 2022-03-02 DIAGNOSIS — Z0189 Encounter for other specified special examinations: Secondary | ICD-10-CM | POA: Diagnosis not present

## 2022-03-02 DIAGNOSIS — I5032 Chronic diastolic (congestive) heart failure: Secondary | ICD-10-CM | POA: Diagnosis not present

## 2022-03-02 DIAGNOSIS — R609 Edema, unspecified: Secondary | ICD-10-CM | POA: Diagnosis not present

## 2022-03-02 NOTE — Progress Notes (Signed)
? ? ?Electrophysiology Office Note ?Date: 03/07/2022 ? ?ID:  HOLDEN DRAUGHON, DOB Jun 08, 1932, MRN [id number] ? ?PCP: Lawerance Cruel, MD ?Primary Cardiologist: None ?Electrophysiologist: Cristopher Peru, MD  ? ?CC: Pacemaker follow-up ? ?[name] is a 88 y.o. female seen today for Cristopher Peru, MD for routine electrophysiology followup.  Since last being seen in our clinic the patient reports doing well overall. She has been having issues with peripheral edema and was recently changed to torsemide 4/4. Cr being monitoring biweekly.  she denies chest pain, palpitations, dyspnea, PND, orthopnea, nausea, vomiting, dizziness, syncope, weight gain, or early satiety. ? ?Device History: ?Medtronic BiV PPM implanted 2017 (downgrade from ICD which had been previously implanted 2008) ? ?Past Medical History:  ?Diagnosis Date  ? Allergic rhinitis   ? Anemia   ? iron deficient  ? Aneurysm ([place])   ? Anxiety   ? Basal cell carcinoma   ? Chronic anemia   ? Chronic low back pain   ? Colon polyps   ? GERD (gastroesophageal reflux disease)   ? Hiatal hernia   ? HTN (hypertension)   ? Hypercholesteremia   ? Hyperlipidemia   ? Hypothyroidism   ? Insomnia   ? Left heart failure ([place])   ? Osteoporosis   ? S/P forteo treatment as of 02/2009  ? Pacemaker   ? Polymyalgia ([place])   ? rheumatica-steroids per Rheum  ? Systolic heart failure   ? ?Past Surgical History:  ?Procedure Laterality Date  ? ABDOMINAL HYSTERECTOMY    ? ANGIOPLASTY    ? BACK SURGERY    ? CARDIAC CATHETERIZATION  10/16/06  ? CATARACT EXTRACTION    ? defibulator implant    ? EP IMPLANTABLE DEVICE N/A 04/10/2016  ? Procedure: BiV Pacemaker Insertion CRT-P;  Surgeon: Evans Lance, MD;  Location: [place] [facility];  Service: Cardiovascular;  Laterality: N/A;  ? FOOT SURGERY    ? MITRAL VALVE REPAIR    ? skin cancer removal    ? THYROIDECTOMY    ? TONSILLECTOMY    ? ? ?Current Outpatient Medications  ?Medication Sig Dispense Refill  ? acetaminophen (TYLENOL) 325 MG tablet  Take 1-2 tablets (325-650 mg total) by mouth every 4 (four) hours as needed for mild pain.    ? amLODipine (NORVASC) 10 MG tablet Take 1 tablet (10 mg total) by mouth daily.    ? atorvastatin (LIPITOR) 40 MG tablet Take 1 tablet (40 mg total) by mouth daily.    ? carvedilol (COREG) 6.25 MG tablet Take 1 tablet (6.25 mg total) by mouth 2 (two) times daily with a meal.    ? furosemide (LASIX) 40 MG tablet Take 40 mg by mouth.    ? HYDROcodone-acetaminophen (NORCO) 10-325 MG tablet Take 1 tablet by mouth 3 (three) times daily before meals. 15 tablet 0  ? iron polysaccharides (NIFEREX) 150 MG capsule Take 150 mg by mouth daily.    ? levothyroxine (SYNTHROID, LEVOTHROID) 75 MCG tablet Take 1 tablet (75 mcg total) by mouth daily. 90 tablet 1  ? lisinopril (ZESTRIL) 40 MG tablet Take 40 mg by mouth daily.    ? melatonin 5 MG TABS Take 1 tablet (5 mg total) by mouth at bedtime as needed.  0  ? methylPREDNISolone (MEDROL) 4 MG tablet Take 1 tablet (4 mg total) by mouth daily.    ? ondansetron (ZOFRAN) 4 MG tablet Take 1 tablet (4 mg total) by mouth every 12 (twelve) hours. 20 tablet 0  ? pantoprazole (PROTONIX)  40 MG tablet Take 1 tablet (40 mg total) by mouth daily.    ? traZODone (DESYREL) 50 MG tablet Take 0.5-1 tablets (25-50 mg total) by mouth at bedtime as needed for sleep.    ? albuterol (PROVENTIL) (2.5 MG/3ML) 0.083% nebulizer solution Take 3 mLs (2.5 mg total) by nebulization every 6 (six) hours as needed for wheezing or shortness of breath. (Patient not taking: Reported on 03/07/2022) 75 mL 12  ? hydrALAZINE (APRESOLINE) 25 MG tablet Take 1 tablet (25 mg total) by mouth every 6 (six) hours as needed (SBP>150 or DBP>100). (Patient not taking: Reported on 03/07/2022)    ? METOPROLOL & DIET MANAGE PROD PO Take by mouth. (Patient not taking: Reported on 03/07/2022)    ? oxyCODONE (OXY IR/ROXICODONE) 5 MG immediate release tablet Take 1 tablet (5 mg total) by mouth 2 (two) times daily as needed for severe pain. (Patient  not taking: Reported on 03/07/2022) 6 tablet 0  ? prochlorperazine (COMPAZINE) 5 MG tablet Take 1-2 tablets (5-10 mg total) by mouth every 6 (six) hours as needed for nausea. (Patient not taking: Reported on 03/07/2022) 30 tablet 0  ? senna-docusate (SENOKOT-S) 8.6-50 MG tablet Take 1 tablet by mouth at bedtime. (Patient not taking: Reported on 03/07/2022)    ? simethicone (MYLICON) 40 WJ/1.9JY drops Take 1.2 mLs (80 mg total) by mouth 4 (four) times daily. (Patient not taking: Reported on 03/07/2022) 30 mL 0  ? sodium phosphate (FLEET) 7-19 GM/118ML ENEM Place 133 mLs (1 enema total) rectally once as needed for severe constipation. (Patient not taking: Reported on 03/07/2022)  0  ? ?No current facility-administered medications for this visit.  ? ? ?Allergies:   Pertussis vaccines, Sulfa antibiotics, and Amoxicillin  ? ?Social History: ?Social History  ? ?Socioeconomic History  ? Marital status: Widowed  ?  Spouse name: Not on file  ? Number of children: Not on file  ? Years of education: Not on file  ? Highest education level: Not on file  ?Occupational History  ? Not on file  ?Tobacco Use  ? Smoking status: Never  ? Smokeless tobacco: Never  ?Vaping Use  ? Vaping Use: Never used  ?Substance and Sexual Activity  ? Alcohol use: No  ? Drug use: No  ? Sexual activity: Not on file  ?Other Topics Concern  ? Not on file  ?Social History Narrative  ? Not on file  ? ?Social Determinants of Health  ? ?Financial Resource Strain: Not on file  ?Food Insecurity: Not on file  ?Transportation Needs: Not on file  ?Physical Activity: Not on file  ?Stress: Not on file  ?Social Connections: Not on file  ?Intimate Partner Violence: Not on file  ? ? ?Family History: ?Family History  ?Problem Relation Age of Onset  ? Stroke Mother   ?     in her 13's  ? Cancer Father 41  ?     secondary  ? Colon cancer Brother   ? Colon cancer Sister   ? Breast cancer Sister   ? ? ? ?Review of Systems: ?All other systems reviewed and are otherwise negative  except as noted above. ? ?Physical Exam: ?Vitals:  ? 03/07/22 1109  ?BP: 114/76  ?Pulse: 90  ?SpO2: 98%  ?Weight: 108 lb (49 kg)  ?Height: 4' 0.5" (1.232 m)  ?  ? ?GEN- The patient is well appearing, alert and oriented x 3 today.   ?HEENT: normocephalic, atraumatic; sclera clear, conjunctiva pink; hearing intact; oropharynx clear; neck supple  ?  Lungs- Clear to ausculation bilaterally, normal work of breathing.  No wheezes, rales, rhonchi ?Heart- Regular rate and rhythm, no murmurs, rubs or gallops  ?GI- soft, non-tender, non-distended, bowel sounds present  ?Extremities- no clubbing or cyanosis. No edema ?MS- no significant deformity or atrophy ?Skin- warm and dry, no rash or lesion; PPM pocket well healed ?Psych- euthymic mood, full affect ?Neuro- strength and sensation are intact ? ?PPM Interrogation- reviewed in detail today,  See PACEART report ? ?EKG:  EKG is not ordered today. ?Personal review of ekg ordered  09/2021  shows AS/VP at 84 bpm ? ?Recent Labs: ?10/03/2021: Magnesium 2.5 ?10/12/2021: TSH 10.145 ?10/19/2021: ALT 29 ?10/30/2021: BUN 21; Creatinine, Ser 0.80; Hemoglobin 8.7; Platelets 222; Potassium 4.7; Sodium 135  ? ?Wt Readings from Last 3 Encounters:  ?03/07/22 108 lb (49 kg)  ?10/31/21 118 lb 2.7 oz (53.6 kg)  ?10/18/21 109 lb 12.6 oz (49.8 kg)  ?  ? ?Other studies Reviewed: ?Additional studies/ records that were reviewed today include: Previous EP office notes, Previous remote checks, Most recent labwork.  ? ?Assessment and Plan: ? ?1.  Chronic systolic CHF  s/p Medtronic PPM (previously downgraded from ICD) ?Normal PPM function ?See Claudia Desanctis Art report ?No changes today ?She has to manual send remotes and her ALF does not have the bandwidth to accomplish this. Her daughter is going to try and help. Otherwise, will need q 6 month visits.  ? ?2. HTN ?Stable on current regimen  ? ?3. Atrial fibrillation ?Rate controlled.  ?Would recommend continued eliquis. ? ? ?Current medicines are reviewed at length  with the patient today.   ? ?Disposition:   Follow up with Dr. [name] in 6 months  ? ? ?Signed, ?Shirley Friar, PA-C  ?03/07/2022 ?11:17 AM ? ?[facility] ?[address] ?[address] ?[place]

## 2022-03-05 DIAGNOSIS — M6281 Muscle weakness (generalized): Secondary | ICD-10-CM | POA: Diagnosis not present

## 2022-03-05 DIAGNOSIS — R296 Repeated falls: Secondary | ICD-10-CM | POA: Diagnosis not present

## 2022-03-05 DIAGNOSIS — M816 Localized osteoporosis [Lequesne]: Secondary | ICD-10-CM | POA: Diagnosis not present

## 2022-03-05 DIAGNOSIS — R2681 Unsteadiness on feet: Secondary | ICD-10-CM | POA: Diagnosis not present

## 2022-03-05 DIAGNOSIS — R262 Difficulty in walking, not elsewhere classified: Secondary | ICD-10-CM | POA: Diagnosis not present

## 2022-03-05 DIAGNOSIS — M6259 Muscle wasting and atrophy, not elsewhere classified, multiple sites: Secondary | ICD-10-CM | POA: Diagnosis not present

## 2022-03-06 DIAGNOSIS — M816 Localized osteoporosis [Lequesne]: Secondary | ICD-10-CM | POA: Diagnosis not present

## 2022-03-06 DIAGNOSIS — R262 Difficulty in walking, not elsewhere classified: Secondary | ICD-10-CM | POA: Diagnosis not present

## 2022-03-06 DIAGNOSIS — M6281 Muscle weakness (generalized): Secondary | ICD-10-CM | POA: Diagnosis not present

## 2022-03-06 DIAGNOSIS — N1832 Chronic kidney disease, stage 3b: Secondary | ICD-10-CM | POA: Diagnosis not present

## 2022-03-06 DIAGNOSIS — R2681 Unsteadiness on feet: Secondary | ICD-10-CM | POA: Diagnosis not present

## 2022-03-06 DIAGNOSIS — R296 Repeated falls: Secondary | ICD-10-CM | POA: Diagnosis not present

## 2022-03-06 DIAGNOSIS — M6259 Muscle wasting and atrophy, not elsewhere classified, multiple sites: Secondary | ICD-10-CM | POA: Diagnosis not present

## 2022-03-06 DIAGNOSIS — I5032 Chronic diastolic (congestive) heart failure: Secondary | ICD-10-CM | POA: Diagnosis not present

## 2022-03-06 DIAGNOSIS — Z13228 Encounter for screening for other metabolic disorders: Secondary | ICD-10-CM | POA: Diagnosis not present

## 2022-03-07 ENCOUNTER — Encounter: Payer: Self-pay | Admitting: Student

## 2022-03-07 ENCOUNTER — Ambulatory Visit (INDEPENDENT_AMBULATORY_CARE_PROVIDER_SITE_OTHER): Payer: Medicare Other | Admitting: Student

## 2022-03-07 VITALS — BP 114/76 | HR 90 | Ht <= 58 in | Wt 108.0 lb

## 2022-03-07 DIAGNOSIS — I480 Paroxysmal atrial fibrillation: Secondary | ICD-10-CM | POA: Diagnosis not present

## 2022-03-07 DIAGNOSIS — I5022 Chronic systolic (congestive) heart failure: Secondary | ICD-10-CM | POA: Diagnosis not present

## 2022-03-07 DIAGNOSIS — I10 Essential (primary) hypertension: Secondary | ICD-10-CM | POA: Diagnosis not present

## 2022-03-07 LAB — CUP PACEART INCLINIC DEVICE CHECK
Battery Remaining Longevity: 29 mo
Battery Voltage: 2.94 V
Brady Statistic AP VP Percent: 0.68 %
Brady Statistic AP VS Percent: 0.06 %
Brady Statistic AS VP Percent: 95.7 %
Brady Statistic AS VS Percent: 3.55 %
Brady Statistic RA Percent Paced: 0.74 %
Brady Statistic RV Percent Paced: 95.22 %
Date Time Interrogation Session: 20230412120449
Implantable Lead Implant Date: 20080612
Implantable Lead Implant Date: 20080612
Implantable Lead Implant Date: 20080612
Implantable Lead Location: 753858
Implantable Lead Location: 753859
Implantable Lead Location: 753860
Implantable Lead Model: 4194
Implantable Lead Model: 5076
Implantable Lead Model: 6947
Implantable Pulse Generator Implant Date: 20170516
Lead Channel Impedance Value: 266 Ohm
Lead Channel Impedance Value: 285 Ohm
Lead Channel Impedance Value: 285 Ohm
Lead Channel Impedance Value: 323 Ohm
Lead Channel Impedance Value: 342 Ohm
Lead Channel Impedance Value: 361 Ohm
Lead Channel Impedance Value: 456 Ohm
Lead Channel Impedance Value: 532 Ohm
Lead Channel Impedance Value: 627 Ohm
Lead Channel Pacing Threshold Amplitude: 0.75 V
Lead Channel Pacing Threshold Amplitude: 1.5 V
Lead Channel Pacing Threshold Amplitude: 2.375 V
Lead Channel Pacing Threshold Pulse Width: 0.4 ms
Lead Channel Pacing Threshold Pulse Width: 0.4 ms
Lead Channel Pacing Threshold Pulse Width: 0.4 ms
Lead Channel Sensing Intrinsic Amplitude: 14 mV
Lead Channel Sensing Intrinsic Amplitude: 14.5 mV
Lead Channel Sensing Intrinsic Amplitude: 2.625 mV
Lead Channel Sensing Intrinsic Amplitude: 2.75 mV
Lead Channel Setting Pacing Amplitude: 1.5 V
Lead Channel Setting Pacing Amplitude: 2 V
Lead Channel Setting Pacing Amplitude: 2.5 V
Lead Channel Setting Pacing Pulse Width: 0.4 ms
Lead Channel Setting Pacing Pulse Width: 0.8 ms
Lead Channel Setting Sensing Sensitivity: 2.8 mV

## 2022-03-07 NOTE — Patient Instructions (Signed)
Medication Instructions:  ?Your physician recommends that you continue on your current medications as directed. Please refer to the Current Medication list given to you today. ? ?*If you need a refill on your cardiac medications before your next appointment, please call your pharmacy* ? ? ?Lab Work: ?None ?If you have labs (blood work) drawn today and your tests are completely normal, you will receive your results only by: ?MyChart Message (if you have MyChart) OR ?A paper copy in the mail ?If you have any lab test that is abnormal or we need to change your treatment, we will call you to review the results. ? ? ?Follow-Up: ?At [facility], you and your health needs are our priority.  As part of our continuing mission to provide you with exceptional heart care, we have created designated Provider Care Teams.  These Care Teams include your primary Cardiologist (physician) and Advanced Practice Providers (APPs -  Physician Assistants and Nurse Practitioners) who all work together to provide you with the care you need, when you need it. ? ?We recommend signing up for the patient portal called "MyChart".  Sign up information is provided on this After Visit Summary.  MyChart is used to connect with patients for Virtual Visits (Telemedicine).  Patients are able to view lab/test results, encounter notes, upcoming appointments, etc.  Non-urgent messages can be sent to your provider as well.   ?To learn more about what you can do with MyChart, go to [email or web address].   ? ?Your next appointment:   ?6 month(s) ? ?The format for your next appointment:   ?In Person ? ?Provider:   ?Cristopher Peru, MD  ? ?Important Information About Sugar ? ? ? ? ?  ?

## 2022-03-10 DIAGNOSIS — I4891 Unspecified atrial fibrillation: Secondary | ICD-10-CM | POA: Diagnosis not present

## 2022-03-10 DIAGNOSIS — M1990 Unspecified osteoarthritis, unspecified site: Secondary | ICD-10-CM | POA: Diagnosis not present

## 2022-03-10 DIAGNOSIS — K5900 Constipation, unspecified: Secondary | ICD-10-CM | POA: Diagnosis not present

## 2022-03-10 DIAGNOSIS — I872 Venous insufficiency (chronic) (peripheral): Secondary | ICD-10-CM | POA: Diagnosis not present

## 2022-03-10 DIAGNOSIS — L97821 Non-pressure chronic ulcer of other part of left lower leg limited to breakdown of skin: Secondary | ICD-10-CM | POA: Diagnosis not present

## 2022-03-10 DIAGNOSIS — D631 Anemia in chronic kidney disease: Secondary | ICD-10-CM | POA: Diagnosis not present

## 2022-03-10 DIAGNOSIS — M549 Dorsalgia, unspecified: Secondary | ICD-10-CM | POA: Diagnosis not present

## 2022-03-10 DIAGNOSIS — I130 Hypertensive heart and chronic kidney disease with heart failure and stage 1 through stage 4 chronic kidney disease, or unspecified chronic kidney disease: Secondary | ICD-10-CM | POA: Diagnosis not present

## 2022-03-10 DIAGNOSIS — I5030 Unspecified diastolic (congestive) heart failure: Secondary | ICD-10-CM | POA: Diagnosis not present

## 2022-03-10 DIAGNOSIS — Z950 Presence of cardiac pacemaker: Secondary | ICD-10-CM | POA: Diagnosis not present

## 2022-03-10 DIAGNOSIS — G8929 Other chronic pain: Secondary | ICD-10-CM | POA: Diagnosis not present

## 2022-03-10 DIAGNOSIS — Z79891 Long term (current) use of opiate analgesic: Secondary | ICD-10-CM | POA: Diagnosis not present

## 2022-03-10 DIAGNOSIS — N183 Chronic kidney disease, stage 3 unspecified: Secondary | ICD-10-CM | POA: Diagnosis not present

## 2022-03-12 DIAGNOSIS — N183 Chronic kidney disease, stage 3 unspecified: Secondary | ICD-10-CM | POA: Diagnosis not present

## 2022-03-12 DIAGNOSIS — D631 Anemia in chronic kidney disease: Secondary | ICD-10-CM | POA: Diagnosis not present

## 2022-03-12 DIAGNOSIS — I872 Venous insufficiency (chronic) (peripheral): Secondary | ICD-10-CM | POA: Diagnosis not present

## 2022-03-12 DIAGNOSIS — L97821 Non-pressure chronic ulcer of other part of left lower leg limited to breakdown of skin: Secondary | ICD-10-CM | POA: Diagnosis not present

## 2022-03-12 DIAGNOSIS — I5030 Unspecified diastolic (congestive) heart failure: Secondary | ICD-10-CM | POA: Diagnosis not present

## 2022-03-12 DIAGNOSIS — I130 Hypertensive heart and chronic kidney disease with heart failure and stage 1 through stage 4 chronic kidney disease, or unspecified chronic kidney disease: Secondary | ICD-10-CM | POA: Diagnosis not present

## 2022-03-15 DIAGNOSIS — D631 Anemia in chronic kidney disease: Secondary | ICD-10-CM | POA: Diagnosis not present

## 2022-03-15 DIAGNOSIS — I130 Hypertensive heart and chronic kidney disease with heart failure and stage 1 through stage 4 chronic kidney disease, or unspecified chronic kidney disease: Secondary | ICD-10-CM | POA: Diagnosis not present

## 2022-03-15 DIAGNOSIS — I872 Venous insufficiency (chronic) (peripheral): Secondary | ICD-10-CM | POA: Diagnosis not present

## 2022-03-15 DIAGNOSIS — I5030 Unspecified diastolic (congestive) heart failure: Secondary | ICD-10-CM | POA: Diagnosis not present

## 2022-03-15 DIAGNOSIS — L97821 Non-pressure chronic ulcer of other part of left lower leg limited to breakdown of skin: Secondary | ICD-10-CM | POA: Diagnosis not present

## 2022-03-15 DIAGNOSIS — N183 Chronic kidney disease, stage 3 unspecified: Secondary | ICD-10-CM | POA: Diagnosis not present

## 2022-03-18 DIAGNOSIS — D631 Anemia in chronic kidney disease: Secondary | ICD-10-CM | POA: Diagnosis not present

## 2022-03-18 DIAGNOSIS — I5030 Unspecified diastolic (congestive) heart failure: Secondary | ICD-10-CM | POA: Diagnosis not present

## 2022-03-18 DIAGNOSIS — I872 Venous insufficiency (chronic) (peripheral): Secondary | ICD-10-CM | POA: Diagnosis not present

## 2022-03-18 DIAGNOSIS — L97821 Non-pressure chronic ulcer of other part of left lower leg limited to breakdown of skin: Secondary | ICD-10-CM | POA: Diagnosis not present

## 2022-03-18 DIAGNOSIS — N183 Chronic kidney disease, stage 3 unspecified: Secondary | ICD-10-CM | POA: Diagnosis not present

## 2022-03-18 DIAGNOSIS — I130 Hypertensive heart and chronic kidney disease with heart failure and stage 1 through stage 4 chronic kidney disease, or unspecified chronic kidney disease: Secondary | ICD-10-CM | POA: Diagnosis not present

## 2022-03-19 DIAGNOSIS — D631 Anemia in chronic kidney disease: Secondary | ICD-10-CM | POA: Diagnosis not present

## 2022-03-19 DIAGNOSIS — I5030 Unspecified diastolic (congestive) heart failure: Secondary | ICD-10-CM | POA: Diagnosis not present

## 2022-03-19 DIAGNOSIS — N183 Chronic kidney disease, stage 3 unspecified: Secondary | ICD-10-CM | POA: Diagnosis not present

## 2022-03-19 DIAGNOSIS — I872 Venous insufficiency (chronic) (peripheral): Secondary | ICD-10-CM | POA: Diagnosis not present

## 2022-03-19 DIAGNOSIS — I130 Hypertensive heart and chronic kidney disease with heart failure and stage 1 through stage 4 chronic kidney disease, or unspecified chronic kidney disease: Secondary | ICD-10-CM | POA: Diagnosis not present

## 2022-03-19 DIAGNOSIS — L97821 Non-pressure chronic ulcer of other part of left lower leg limited to breakdown of skin: Secondary | ICD-10-CM | POA: Diagnosis not present

## 2022-03-20 DIAGNOSIS — D72829 Elevated white blood cell count, unspecified: Secondary | ICD-10-CM | POA: Diagnosis not present

## 2022-03-20 DIAGNOSIS — I509 Heart failure, unspecified: Secondary | ICD-10-CM | POA: Diagnosis not present

## 2022-03-20 DIAGNOSIS — I10 Essential (primary) hypertension: Secondary | ICD-10-CM | POA: Diagnosis not present

## 2022-03-20 DIAGNOSIS — N1832 Chronic kidney disease, stage 3b: Secondary | ICD-10-CM | POA: Diagnosis not present

## 2022-03-20 DIAGNOSIS — D649 Anemia, unspecified: Secondary | ICD-10-CM | POA: Diagnosis not present

## 2022-03-20 DIAGNOSIS — Z13228 Encounter for screening for other metabolic disorders: Secondary | ICD-10-CM | POA: Diagnosis not present

## 2022-03-21 DIAGNOSIS — N183 Chronic kidney disease, stage 3 unspecified: Secondary | ICD-10-CM | POA: Diagnosis not present

## 2022-03-21 DIAGNOSIS — I872 Venous insufficiency (chronic) (peripheral): Secondary | ICD-10-CM | POA: Diagnosis not present

## 2022-03-21 DIAGNOSIS — L97821 Non-pressure chronic ulcer of other part of left lower leg limited to breakdown of skin: Secondary | ICD-10-CM | POA: Diagnosis not present

## 2022-03-21 DIAGNOSIS — I130 Hypertensive heart and chronic kidney disease with heart failure and stage 1 through stage 4 chronic kidney disease, or unspecified chronic kidney disease: Secondary | ICD-10-CM | POA: Diagnosis not present

## 2022-03-21 DIAGNOSIS — D631 Anemia in chronic kidney disease: Secondary | ICD-10-CM | POA: Diagnosis not present

## 2022-03-21 DIAGNOSIS — I5030 Unspecified diastolic (congestive) heart failure: Secondary | ICD-10-CM | POA: Diagnosis not present

## 2022-03-23 DIAGNOSIS — I872 Venous insufficiency (chronic) (peripheral): Secondary | ICD-10-CM | POA: Diagnosis not present

## 2022-03-23 DIAGNOSIS — I5030 Unspecified diastolic (congestive) heart failure: Secondary | ICD-10-CM | POA: Diagnosis not present

## 2022-03-23 DIAGNOSIS — L97821 Non-pressure chronic ulcer of other part of left lower leg limited to breakdown of skin: Secondary | ICD-10-CM | POA: Diagnosis not present

## 2022-03-23 DIAGNOSIS — N183 Chronic kidney disease, stage 3 unspecified: Secondary | ICD-10-CM | POA: Diagnosis not present

## 2022-03-23 DIAGNOSIS — D631 Anemia in chronic kidney disease: Secondary | ICD-10-CM | POA: Diagnosis not present

## 2022-03-23 DIAGNOSIS — I130 Hypertensive heart and chronic kidney disease with heart failure and stage 1 through stage 4 chronic kidney disease, or unspecified chronic kidney disease: Secondary | ICD-10-CM | POA: Diagnosis not present

## 2022-03-26 DIAGNOSIS — I130 Hypertensive heart and chronic kidney disease with heart failure and stage 1 through stage 4 chronic kidney disease, or unspecified chronic kidney disease: Secondary | ICD-10-CM | POA: Diagnosis not present

## 2022-03-26 DIAGNOSIS — I872 Venous insufficiency (chronic) (peripheral): Secondary | ICD-10-CM | POA: Diagnosis not present

## 2022-03-26 DIAGNOSIS — N183 Chronic kidney disease, stage 3 unspecified: Secondary | ICD-10-CM | POA: Diagnosis not present

## 2022-03-26 DIAGNOSIS — L97821 Non-pressure chronic ulcer of other part of left lower leg limited to breakdown of skin: Secondary | ICD-10-CM | POA: Diagnosis not present

## 2022-03-26 DIAGNOSIS — D631 Anemia in chronic kidney disease: Secondary | ICD-10-CM | POA: Diagnosis not present

## 2022-03-26 DIAGNOSIS — I5030 Unspecified diastolic (congestive) heart failure: Secondary | ICD-10-CM | POA: Diagnosis not present

## 2022-03-27 DIAGNOSIS — Z13228 Encounter for screening for other metabolic disorders: Secondary | ICD-10-CM | POA: Diagnosis not present

## 2022-03-28 DIAGNOSIS — D631 Anemia in chronic kidney disease: Secondary | ICD-10-CM | POA: Diagnosis not present

## 2022-03-28 DIAGNOSIS — I5030 Unspecified diastolic (congestive) heart failure: Secondary | ICD-10-CM | POA: Diagnosis not present

## 2022-03-28 DIAGNOSIS — I872 Venous insufficiency (chronic) (peripheral): Secondary | ICD-10-CM | POA: Diagnosis not present

## 2022-03-28 DIAGNOSIS — N183 Chronic kidney disease, stage 3 unspecified: Secondary | ICD-10-CM | POA: Diagnosis not present

## 2022-03-28 DIAGNOSIS — I130 Hypertensive heart and chronic kidney disease with heart failure and stage 1 through stage 4 chronic kidney disease, or unspecified chronic kidney disease: Secondary | ICD-10-CM | POA: Diagnosis not present

## 2022-03-28 DIAGNOSIS — L97821 Non-pressure chronic ulcer of other part of left lower leg limited to breakdown of skin: Secondary | ICD-10-CM | POA: Diagnosis not present

## 2022-03-30 DIAGNOSIS — I130 Hypertensive heart and chronic kidney disease with heart failure and stage 1 through stage 4 chronic kidney disease, or unspecified chronic kidney disease: Secondary | ICD-10-CM | POA: Diagnosis not present

## 2022-03-30 DIAGNOSIS — N183 Chronic kidney disease, stage 3 unspecified: Secondary | ICD-10-CM | POA: Diagnosis not present

## 2022-03-30 DIAGNOSIS — I5030 Unspecified diastolic (congestive) heart failure: Secondary | ICD-10-CM | POA: Diagnosis not present

## 2022-03-30 DIAGNOSIS — L97821 Non-pressure chronic ulcer of other part of left lower leg limited to breakdown of skin: Secondary | ICD-10-CM | POA: Diagnosis not present

## 2022-03-30 DIAGNOSIS — I872 Venous insufficiency (chronic) (peripheral): Secondary | ICD-10-CM | POA: Diagnosis not present

## 2022-03-30 DIAGNOSIS — D631 Anemia in chronic kidney disease: Secondary | ICD-10-CM | POA: Diagnosis not present

## 2022-04-02 DIAGNOSIS — N183 Chronic kidney disease, stage 3 unspecified: Secondary | ICD-10-CM | POA: Diagnosis not present

## 2022-04-02 DIAGNOSIS — I130 Hypertensive heart and chronic kidney disease with heart failure and stage 1 through stage 4 chronic kidney disease, or unspecified chronic kidney disease: Secondary | ICD-10-CM | POA: Diagnosis not present

## 2022-04-02 DIAGNOSIS — I872 Venous insufficiency (chronic) (peripheral): Secondary | ICD-10-CM | POA: Diagnosis not present

## 2022-04-02 DIAGNOSIS — I5030 Unspecified diastolic (congestive) heart failure: Secondary | ICD-10-CM | POA: Diagnosis not present

## 2022-04-02 DIAGNOSIS — D631 Anemia in chronic kidney disease: Secondary | ICD-10-CM | POA: Diagnosis not present

## 2022-04-02 DIAGNOSIS — L97821 Non-pressure chronic ulcer of other part of left lower leg limited to breakdown of skin: Secondary | ICD-10-CM | POA: Diagnosis not present

## 2022-04-03 DIAGNOSIS — L97821 Non-pressure chronic ulcer of other part of left lower leg limited to breakdown of skin: Secondary | ICD-10-CM | POA: Diagnosis not present

## 2022-04-03 DIAGNOSIS — N183 Chronic kidney disease, stage 3 unspecified: Secondary | ICD-10-CM | POA: Diagnosis not present

## 2022-04-03 DIAGNOSIS — I5030 Unspecified diastolic (congestive) heart failure: Secondary | ICD-10-CM | POA: Diagnosis not present

## 2022-04-03 DIAGNOSIS — I872 Venous insufficiency (chronic) (peripheral): Secondary | ICD-10-CM | POA: Diagnosis not present

## 2022-04-03 DIAGNOSIS — I130 Hypertensive heart and chronic kidney disease with heart failure and stage 1 through stage 4 chronic kidney disease, or unspecified chronic kidney disease: Secondary | ICD-10-CM | POA: Diagnosis not present

## 2022-04-03 DIAGNOSIS — D631 Anemia in chronic kidney disease: Secondary | ICD-10-CM | POA: Diagnosis not present

## 2022-04-04 DIAGNOSIS — N183 Chronic kidney disease, stage 3 unspecified: Secondary | ICD-10-CM | POA: Diagnosis not present

## 2022-04-04 DIAGNOSIS — L97821 Non-pressure chronic ulcer of other part of left lower leg limited to breakdown of skin: Secondary | ICD-10-CM | POA: Diagnosis not present

## 2022-04-04 DIAGNOSIS — I130 Hypertensive heart and chronic kidney disease with heart failure and stage 1 through stage 4 chronic kidney disease, or unspecified chronic kidney disease: Secondary | ICD-10-CM | POA: Diagnosis not present

## 2022-04-04 DIAGNOSIS — I5030 Unspecified diastolic (congestive) heart failure: Secondary | ICD-10-CM | POA: Diagnosis not present

## 2022-04-04 DIAGNOSIS — D631 Anemia in chronic kidney disease: Secondary | ICD-10-CM | POA: Diagnosis not present

## 2022-04-04 DIAGNOSIS — I872 Venous insufficiency (chronic) (peripheral): Secondary | ICD-10-CM | POA: Diagnosis not present

## 2022-04-09 DIAGNOSIS — M1990 Unspecified osteoarthritis, unspecified site: Secondary | ICD-10-CM | POA: Diagnosis not present

## 2022-04-09 DIAGNOSIS — I5030 Unspecified diastolic (congestive) heart failure: Secondary | ICD-10-CM | POA: Diagnosis not present

## 2022-04-09 DIAGNOSIS — L97821 Non-pressure chronic ulcer of other part of left lower leg limited to breakdown of skin: Secondary | ICD-10-CM | POA: Diagnosis not present

## 2022-04-09 DIAGNOSIS — M549 Dorsalgia, unspecified: Secondary | ICD-10-CM | POA: Diagnosis not present

## 2022-04-09 DIAGNOSIS — G8929 Other chronic pain: Secondary | ICD-10-CM | POA: Diagnosis not present

## 2022-04-09 DIAGNOSIS — Z950 Presence of cardiac pacemaker: Secondary | ICD-10-CM | POA: Diagnosis not present

## 2022-04-09 DIAGNOSIS — D631 Anemia in chronic kidney disease: Secondary | ICD-10-CM | POA: Diagnosis not present

## 2022-04-09 DIAGNOSIS — Z79891 Long term (current) use of opiate analgesic: Secondary | ICD-10-CM | POA: Diagnosis not present

## 2022-04-09 DIAGNOSIS — I4891 Unspecified atrial fibrillation: Secondary | ICD-10-CM | POA: Diagnosis not present

## 2022-04-09 DIAGNOSIS — N183 Chronic kidney disease, stage 3 unspecified: Secondary | ICD-10-CM | POA: Diagnosis not present

## 2022-04-09 DIAGNOSIS — I872 Venous insufficiency (chronic) (peripheral): Secondary | ICD-10-CM | POA: Diagnosis not present

## 2022-04-09 DIAGNOSIS — I130 Hypertensive heart and chronic kidney disease with heart failure and stage 1 through stage 4 chronic kidney disease, or unspecified chronic kidney disease: Secondary | ICD-10-CM | POA: Diagnosis not present

## 2022-04-09 DIAGNOSIS — K5900 Constipation, unspecified: Secondary | ICD-10-CM | POA: Diagnosis not present

## 2022-04-10 DIAGNOSIS — N183 Chronic kidney disease, stage 3 unspecified: Secondary | ICD-10-CM | POA: Diagnosis not present

## 2022-04-10 DIAGNOSIS — I130 Hypertensive heart and chronic kidney disease with heart failure and stage 1 through stage 4 chronic kidney disease, or unspecified chronic kidney disease: Secondary | ICD-10-CM | POA: Diagnosis not present

## 2022-04-10 DIAGNOSIS — L97821 Non-pressure chronic ulcer of other part of left lower leg limited to breakdown of skin: Secondary | ICD-10-CM | POA: Diagnosis not present

## 2022-04-10 DIAGNOSIS — I872 Venous insufficiency (chronic) (peripheral): Secondary | ICD-10-CM | POA: Diagnosis not present

## 2022-04-10 DIAGNOSIS — I5030 Unspecified diastolic (congestive) heart failure: Secondary | ICD-10-CM | POA: Diagnosis not present

## 2022-04-10 DIAGNOSIS — D631 Anemia in chronic kidney disease: Secondary | ICD-10-CM | POA: Diagnosis not present

## 2022-04-11 DIAGNOSIS — N183 Chronic kidney disease, stage 3 unspecified: Secondary | ICD-10-CM | POA: Diagnosis not present

## 2022-04-11 DIAGNOSIS — L97821 Non-pressure chronic ulcer of other part of left lower leg limited to breakdown of skin: Secondary | ICD-10-CM | POA: Diagnosis not present

## 2022-04-11 DIAGNOSIS — I5030 Unspecified diastolic (congestive) heart failure: Secondary | ICD-10-CM | POA: Diagnosis not present

## 2022-04-11 DIAGNOSIS — D631 Anemia in chronic kidney disease: Secondary | ICD-10-CM | POA: Diagnosis not present

## 2022-04-11 DIAGNOSIS — I130 Hypertensive heart and chronic kidney disease with heart failure and stage 1 through stage 4 chronic kidney disease, or unspecified chronic kidney disease: Secondary | ICD-10-CM | POA: Diagnosis not present

## 2022-04-11 DIAGNOSIS — I872 Venous insufficiency (chronic) (peripheral): Secondary | ICD-10-CM | POA: Diagnosis not present

## 2022-04-13 DIAGNOSIS — D631 Anemia in chronic kidney disease: Secondary | ICD-10-CM | POA: Diagnosis not present

## 2022-04-13 DIAGNOSIS — N183 Chronic kidney disease, stage 3 unspecified: Secondary | ICD-10-CM | POA: Diagnosis not present

## 2022-04-13 DIAGNOSIS — I872 Venous insufficiency (chronic) (peripheral): Secondary | ICD-10-CM | POA: Diagnosis not present

## 2022-04-13 DIAGNOSIS — I130 Hypertensive heart and chronic kidney disease with heart failure and stage 1 through stage 4 chronic kidney disease, or unspecified chronic kidney disease: Secondary | ICD-10-CM | POA: Diagnosis not present

## 2022-04-13 DIAGNOSIS — L97821 Non-pressure chronic ulcer of other part of left lower leg limited to breakdown of skin: Secondary | ICD-10-CM | POA: Diagnosis not present

## 2022-04-13 DIAGNOSIS — I5030 Unspecified diastolic (congestive) heart failure: Secondary | ICD-10-CM | POA: Diagnosis not present

## 2022-04-13 NOTE — Progress Notes (Signed)
Electrophysiology Office Note Date: 04/19/2022  ID:  [name], DOB Sep 29, 1932, MRN [id number]  PCP: Cheryl Cruel, MD Primary Cardiologist: None Electrophysiologist: Cheryl Peru, MD   CC: Pacemaker follow-up  [name] is a 88 y.o. female seen today for Cheryl Peru, MD for routine electrophysiology followup due to a miscommunication about the timing of same. Since last being seen in our clinic the patient reports doing well overall.   Seen last 03/07/22 and was having some peripheral edema. ALF had changed to torsemide.  She denies symptoms of palpitations, chest pain, shortness of breath, orthopnea, PND, lower extremity edema, claudication, dizziness, presyncope, syncope, bleeding, or neurologic sequela. The patient is tolerating medications without difficulties.    She continues to have peripheral edema without systemic systems, and her facility is dosing her torsemide.  They were under the impression that she needed MONTHLY in person checks since they are unable to facilitate her remote reports. No new complaints from patient today.   Device History: Medtronic BiV PPM implanted 2017 (downgrade from ICD which had been previously implanted 2008)  Past Medical History:  Diagnosis Date   Allergic rhinitis    Anemia    iron deficient   Aneurysm ([place])    Anxiety    Basal cell carcinoma    Chronic anemia    Chronic low back pain    Colon polyps    GERD (gastroesophageal reflux disease)    Hiatal hernia    HTN (hypertension)    Hypercholesteremia    Hyperlipidemia    Hypothyroidism    Insomnia    Left heart failure ([place])    Osteoporosis    S/P forteo treatment as of 02/2009   Pacemaker    Polymyalgia ([place])    rheumatica-steroids per Rheum   Systolic heart failure    Past Surgical History:  Procedure Laterality Date   ABDOMINAL HYSTERECTOMY     ANGIOPLASTY     BACK SURGERY     CARDIAC CATHETERIZATION  10/16/06   CATARACT EXTRACTION     defibulator  implant     EP IMPLANTABLE DEVICE N/A 04/10/2016   Procedure: BiV Pacemaker Insertion CRT-P;  Surgeon: Evans Lance, MD;  Location: [facility];  Service: Cardiovascular;  Laterality: N/A;   FOOT SURGERY     MITRAL VALVE REPAIR     skin cancer removal     THYROIDECTOMY     TONSILLECTOMY      Current Outpatient Medications  Medication Sig Dispense Refill   atorvastatin (LIPITOR) 40 MG tablet Take 1 tablet (40 mg total) by mouth daily.     furosemide (LASIX) 40 MG tablet Take 40 mg by mouth.     HYDROcodone-acetaminophen (NORCO) 10-325 MG tablet Take 1 tablet by mouth 3 (three) times daily before meals. 15 tablet 0   iron polysaccharides (NIFEREX) 150 MG capsule Take 150 mg by mouth daily.     levothyroxine (SYNTHROID, LEVOTHROID) 75 MCG tablet Take 1 tablet (75 mcg total) by mouth daily. 90 tablet 1   lisinopril (ZESTRIL) 40 MG tablet Take 40 mg by mouth daily.     megestrol (MEGACE) 40 MG/ML suspension Take 1 mL by mouth in the morning and at bedtime.     melatonin 5 MG TABS Take 1 tablet (5 mg total) by mouth at bedtime as needed.  0   methylPREDNISolone (MEDROL) 4 MG tablet Take 1 tablet (4 mg total) by mouth daily.     metoprolol succinate (TOPROL-XL) 100 MG 24  hr tablet Take 100 mg by mouth daily.     ondansetron (ZOFRAN) 4 MG tablet Take 1 tablet (4 mg total) by mouth every 12 (twelve) hours. 20 tablet 0   pantoprazole (PROTONIX) 40 MG tablet Take 1 tablet (40 mg total) by mouth daily.     senna-docusate (SENOKOT-S) 8.6-50 MG tablet Take 1 tablet by mouth at bedtime.     simethicone (MYLICON) 40 XB/3.5HG drops Take 1.2 mLs (80 mg total) by mouth 4 (four) times daily. 30 mL 0   sodium phosphate (FLEET) 7-19 GM/118ML ENEM Place 133 mLs (1 enema total) rectally once as needed for severe constipation.  0   torsemide (DEMADEX) 20 MG tablet Take 20 mg by mouth as directed. 20 mg m-w-f, '40mg'$  all other days     traZODone (DESYREL) 50 MG tablet Take 0.5-1 tablets (25-50 mg total) by  mouth at bedtime as needed for sleep.     No current facility-administered medications for this visit.    Allergies:   Pertussis vaccines, Sulfa antibiotics, and Amoxicillin   Social History: Social History   Socioeconomic History   Marital status: Widowed    Spouse name: Not on file   Number of children: Not on file   Years of education: Not on file   Highest education level: Not on file  Occupational History   Not on file  Tobacco Use   Smoking status: Never   Smokeless tobacco: Never  Vaping Use   Vaping Use: Never used  Substance and Sexual Activity   Alcohol use: No   Drug use: No   Sexual activity: Not on file  Other Topics Concern   Not on file  Social History Narrative   Not on file   Social Determinants of Health   Financial Resource Strain: Not on file  Food Insecurity: Not on file  Transportation Needs: Not on file  Physical Activity: Not on file  Stress: Not on file  Social Connections: Not on file  Intimate Partner Violence: Not on file    Family History: Family History  Problem Relation Age of Onset   Stroke Mother        in her 40's   Cancer Father 78       secondary   Colon cancer Brother    Colon cancer Sister    Breast cancer Sister     Review of systems complete and found to be negative unless listed in HPI.    Physical Exam: Vitals:   04/19/22 0829  BP: (!) 130/58  Pulse: 96  SpO2: 98%  Weight: 111 lb (50.3 kg)  Height: 4' (1.219 m)     General: Pleasant, NAD. No resp difficulty Psych: Normal affect. HEENT:  Normal, without mass or lesion.         Neck: Supple, no bruits or JVD. Carotids 2+. No lymphadenopathy/thyromegaly appreciated. Heart: PMI nondisplaced. RRR no s3, s4, or murmurs. Lungs:  Resp regular and unlabored, CTA. Abdomen: Soft, non-tender, non-distended, No HSM, BS + x 4.   Extremities: No clubbing or cyanosis. 2+ soft ankle edema. DP/PT/Radials 2+ and equal bilaterally. Neuro: Alert and oriented X 3. Moves  all extremities spontaneously.   PPM Interrogation- reviewed in detail today,  See PACEART report  EKG:  EKG is not ordered today. Personal review of ekg ordered  09/2021  shows AS/VP at 84 bpm  Recent Labs: 10/03/2021: Magnesium 2.5 10/12/2021: TSH 10.145 10/19/2021: ALT 29 10/30/2021: BUN 21; Creatinine, Ser 0.80; Hemoglobin 8.7; Platelets 222; Potassium 4.7; Sodium  135   Wt Readings from Last 3 Encounters:  04/19/22 111 lb (50.3 kg)  03/07/22 108 lb (49 kg)  10/31/21 118 lb 2.7 oz (53.6 kg)     Other studies Reviewed: Additional studies/ records that were reviewed today include: Previous EP office notes, Previous remote checks, Most recent labwork.   Assessment and Plan:  1.  Chronic systolic CHF  s/p Medtronic PPM (previously downgraded from ICD) Normal PPM function See Pace Art report No changes today. She has to manual send remotes and her ALF does not have the bandwidth to accomplish this. Her daughter is going to try and help. Otherwise, will need in person visits q 6 month visits.  She has persistent peripheral edema in the setting of chronic venous stasis. No SOB or skin breakdown. Take torsemide 40 mg daily x 3 days then go back to 20 mg on M/W/F with 40 on all other days.   2. HTN Stable on current regimen   3. Atrial fibrillation Today she is in NSR.  Continue eliquis.   Current medicines are reviewed at length with the patient today   Disposition:   Follow up with Dr. [name] in 6 months as previously recommended   Signed, Shirley Friar, PA-C  04/19/2022 8:41 AM  [place] [address] [place] [address] [phone] (office) [phone] (fax)

## 2022-04-17 DIAGNOSIS — R4189 Other symptoms and signs involving cognitive functions and awareness: Secondary | ICD-10-CM | POA: Diagnosis not present

## 2022-04-17 DIAGNOSIS — G40909 Epilepsy, unspecified, not intractable, without status epilepticus: Secondary | ICD-10-CM | POA: Diagnosis not present

## 2022-04-17 DIAGNOSIS — R0689 Other abnormalities of breathing: Secondary | ICD-10-CM | POA: Diagnosis not present

## 2022-04-17 DIAGNOSIS — I509 Heart failure, unspecified: Secondary | ICD-10-CM | POA: Diagnosis not present

## 2022-04-17 DIAGNOSIS — M549 Dorsalgia, unspecified: Secondary | ICD-10-CM | POA: Diagnosis not present

## 2022-04-19 ENCOUNTER — Encounter: Payer: Self-pay | Admitting: Student

## 2022-04-19 ENCOUNTER — Ambulatory Visit (INDEPENDENT_AMBULATORY_CARE_PROVIDER_SITE_OTHER): Payer: Medicare Other | Admitting: Student

## 2022-04-19 VITALS — BP 130/58 | HR 96 | Ht <= 58 in | Wt 111.0 lb

## 2022-04-19 DIAGNOSIS — I5022 Chronic systolic (congestive) heart failure: Secondary | ICD-10-CM

## 2022-04-19 DIAGNOSIS — M25552 Pain in left hip: Secondary | ICD-10-CM | POA: Diagnosis not present

## 2022-04-19 DIAGNOSIS — I480 Paroxysmal atrial fibrillation: Secondary | ICD-10-CM | POA: Diagnosis not present

## 2022-04-19 DIAGNOSIS — I5030 Unspecified diastolic (congestive) heart failure: Secondary | ICD-10-CM | POA: Diagnosis not present

## 2022-04-19 DIAGNOSIS — I130 Hypertensive heart and chronic kidney disease with heart failure and stage 1 through stage 4 chronic kidney disease, or unspecified chronic kidney disease: Secondary | ICD-10-CM | POA: Diagnosis not present

## 2022-04-19 DIAGNOSIS — I10 Essential (primary) hypertension: Secondary | ICD-10-CM | POA: Diagnosis not present

## 2022-04-19 DIAGNOSIS — R102 Pelvic and perineal pain: Secondary | ICD-10-CM | POA: Diagnosis not present

## 2022-04-19 DIAGNOSIS — M546 Pain in thoracic spine: Secondary | ICD-10-CM | POA: Diagnosis not present

## 2022-04-19 DIAGNOSIS — I872 Venous insufficiency (chronic) (peripheral): Secondary | ICD-10-CM | POA: Diagnosis not present

## 2022-04-19 DIAGNOSIS — M25551 Pain in right hip: Secondary | ICD-10-CM | POA: Diagnosis not present

## 2022-04-19 DIAGNOSIS — L97821 Non-pressure chronic ulcer of other part of left lower leg limited to breakdown of skin: Secondary | ICD-10-CM | POA: Diagnosis not present

## 2022-04-19 DIAGNOSIS — D631 Anemia in chronic kidney disease: Secondary | ICD-10-CM | POA: Diagnosis not present

## 2022-04-19 DIAGNOSIS — N183 Chronic kidney disease, stage 3 unspecified: Secondary | ICD-10-CM | POA: Diagnosis not present

## 2022-04-19 DIAGNOSIS — M545 Low back pain, unspecified: Secondary | ICD-10-CM | POA: Diagnosis not present

## 2022-04-19 LAB — CUP PACEART INCLINIC DEVICE CHECK
Battery Remaining Longevity: 26 mo
Battery Voltage: 2.93 V
Brady Statistic AP VP Percent: 0.32 %
Brady Statistic AP VS Percent: 0.01 %
Brady Statistic AS VP Percent: 98.84 %
Brady Statistic AS VS Percent: 0.83 %
Brady Statistic RA Percent Paced: 0.33 %
Brady Statistic RV Percent Paced: 99.02 %
Date Time Interrogation Session: 20230525084442
Implantable Lead Implant Date: 20080612
Implantable Lead Implant Date: 20080612
Implantable Lead Implant Date: 20080612
Implantable Lead Location: 753858
Implantable Lead Location: 753859
Implantable Lead Location: 753860
Implantable Lead Model: 4194
Implantable Lead Model: 5076
Implantable Lead Model: 6947
Implantable Pulse Generator Implant Date: 20170516
Lead Channel Impedance Value: 304 Ohm
Lead Channel Impedance Value: 304 Ohm
Lead Channel Impedance Value: 342 Ohm
Lead Channel Impedance Value: 361 Ohm
Lead Channel Impedance Value: 418 Ohm
Lead Channel Impedance Value: 494 Ohm
Lead Channel Impedance Value: 570 Ohm
Lead Channel Impedance Value: 722 Ohm
Lead Channel Impedance Value: 760 Ohm
Lead Channel Pacing Threshold Amplitude: 0.625 V
Lead Channel Pacing Threshold Amplitude: 1.5 V
Lead Channel Pacing Threshold Amplitude: 2.375 V
Lead Channel Pacing Threshold Pulse Width: 0.4 ms
Lead Channel Pacing Threshold Pulse Width: 0.4 ms
Lead Channel Pacing Threshold Pulse Width: 0.4 ms
Lead Channel Sensing Intrinsic Amplitude: 15.625 mV
Lead Channel Sensing Intrinsic Amplitude: 17 mV
Lead Channel Sensing Intrinsic Amplitude: 3.5 mV
Lead Channel Sensing Intrinsic Amplitude: 3.625 mV
Lead Channel Setting Pacing Amplitude: 1.5 V
Lead Channel Setting Pacing Amplitude: 2 V
Lead Channel Setting Pacing Amplitude: 2.5 V
Lead Channel Setting Pacing Pulse Width: 0.4 ms
Lead Channel Setting Pacing Pulse Width: 0.8 ms
Lead Channel Setting Sensing Sensitivity: 2.8 mV

## 2022-04-19 MED ORDER — TORSEMIDE 20 MG PO TABS: 20.0000 mg | ORAL_TABLET | ORAL | 3 refills | Status: AC

## 2022-04-19 NOTE — Patient Instructions (Signed)
Medication Instructions:  Your physician has recommended you make the following change in your medication:   INCREASE: Torsemide to '40mg'$  daily for 3 days then resume original dose of alternating '20mg'$  and '40mg'$ .  *If you need a refill on your cardiac medications before your next appointment, please call your pharmacy*   Lab Work: TODAY: BMET, BNP  If you have labs (blood work) drawn today and your tests are completely normal, you will receive your results only by: [place] (if you have MyChart) OR A paper copy in the mail If you have any lab test that is abnormal or we need to change your treatment, we will call you to review the results.   Follow-Up: At [facility], you and your health needs are our priority.  As part of our continuing mission to provide you with exceptional heart care, we have created designated Provider Care Teams.  These Care Teams include your primary Cardiologist (physician) and Advanced Practice Providers (APPs -  Physician Assistants and Nurse Practitioners) who all work together to provide you with the care you need, when you need it.  We recommend signing up for the patient portal called "MyChart".  Sign up information is provided on this After Visit Summary.  MyChart is used to connect with patients for Virtual Visits (Telemedicine).  Patients are able to view lab/test results, encounter notes, upcoming appointments, etc.  Non-urgent messages can be sent to your provider as well.   To learn more about what you can do with MyChart, go to [email or web address].    Your next appointment:   6 month(s)  The format for your next appointment:   In Person  Provider:   Cristopher Peru, MD{

## 2022-04-20 DIAGNOSIS — Z882 Allergy status to sulfonamides status: Secondary | ICD-10-CM | POA: Diagnosis not present

## 2022-04-20 DIAGNOSIS — E785 Hyperlipidemia, unspecified: Secondary | ICD-10-CM | POA: Diagnosis present

## 2022-04-20 DIAGNOSIS — I052 Rheumatic mitral stenosis with insufficiency: Secondary | ICD-10-CM | POA: Diagnosis present

## 2022-04-20 DIAGNOSIS — N179 Acute kidney failure, unspecified: Secondary | ICD-10-CM | POA: Diagnosis present

## 2022-04-20 DIAGNOSIS — I130 Hypertensive heart and chronic kidney disease with heart failure and stage 1 through stage 4 chronic kidney disease, or unspecified chronic kidney disease: Secondary | ICD-10-CM | POA: Diagnosis present

## 2022-04-20 DIAGNOSIS — M810 Age-related osteoporosis without current pathological fracture: Secondary | ICD-10-CM | POA: Diagnosis present

## 2022-04-20 DIAGNOSIS — N183 Chronic kidney disease, stage 3 unspecified: Secondary | ICD-10-CM | POA: Diagnosis present

## 2022-04-20 DIAGNOSIS — G8929 Other chronic pain: Secondary | ICD-10-CM | POA: Diagnosis not present

## 2022-04-20 DIAGNOSIS — Z887 Allergy status to serum and vaccine status: Secondary | ICD-10-CM | POA: Diagnosis not present

## 2022-04-20 DIAGNOSIS — Z79890 Hormone replacement therapy (postmenopausal): Secondary | ICD-10-CM | POA: Diagnosis not present

## 2022-04-20 DIAGNOSIS — I5022 Chronic systolic (congestive) heart failure: Secondary | ICD-10-CM | POA: Diagnosis not present

## 2022-04-20 DIAGNOSIS — Z7982 Long term (current) use of aspirin: Secondary | ICD-10-CM | POA: Diagnosis not present

## 2022-04-20 DIAGNOSIS — Z881 Allergy status to other antibiotic agents status: Secondary | ICD-10-CM | POA: Diagnosis not present

## 2022-04-20 DIAGNOSIS — Z950 Presence of cardiac pacemaker: Secondary | ICD-10-CM | POA: Diagnosis not present

## 2022-04-20 DIAGNOSIS — M549 Dorsalgia, unspecified: Secondary | ICD-10-CM | POA: Diagnosis present

## 2022-04-20 DIAGNOSIS — F419 Anxiety disorder, unspecified: Secondary | ICD-10-CM | POA: Diagnosis not present

## 2022-04-20 DIAGNOSIS — K219 Gastro-esophageal reflux disease without esophagitis: Secondary | ICD-10-CM | POA: Diagnosis present

## 2022-04-20 DIAGNOSIS — I517 Cardiomegaly: Secondary | ICD-10-CM | POA: Diagnosis not present

## 2022-04-20 DIAGNOSIS — I4510 Unspecified right bundle-branch block: Secondary | ICD-10-CM | POA: Diagnosis not present

## 2022-04-20 DIAGNOSIS — I5043 Acute on chronic combined systolic (congestive) and diastolic (congestive) heart failure: Secondary | ICD-10-CM | POA: Diagnosis present

## 2022-04-20 DIAGNOSIS — I083 Combined rheumatic disorders of mitral, aortic and tricuspid valves: Secondary | ICD-10-CM | POA: Diagnosis not present

## 2022-04-20 DIAGNOSIS — Z79899 Other long term (current) drug therapy: Secondary | ICD-10-CM | POA: Diagnosis not present

## 2022-04-20 DIAGNOSIS — E039 Hypothyroidism, unspecified: Secondary | ICD-10-CM | POA: Diagnosis present

## 2022-04-20 LAB — BASIC METABOLIC PANEL
BUN/Creatinine Ratio: 20 (ref 12–28)
BUN: 62 mg/dL — ABNORMAL HIGH (ref 8–27)
CO2: 24 mmol/L (ref 20–29)
Calcium: 9.4 mg/dL (ref 8.7–10.3)
Chloride: 98 mmol/L (ref 96–106)
Creatinine, Ser: 3.11 mg/dL — ABNORMAL HIGH (ref 0.57–1.00)
Glucose: 109 mg/dL — ABNORMAL HIGH (ref 70–99)
Potassium: 4.6 mmol/L (ref 3.5–5.2)
Sodium: 140 mmol/L (ref 134–144)
eGFR: 14 mL/min/{1.73_m2} — ABNORMAL LOW (ref >59)

## 2022-04-20 LAB — PRO B NATRIURETIC PEPTIDE: NT-Pro BNP: 2738 pg/mL — ABNORMAL HIGH (ref 0–738)

## 2022-04-21 DIAGNOSIS — Z7982 Long term (current) use of aspirin: Secondary | ICD-10-CM | POA: Diagnosis not present

## 2022-04-21 DIAGNOSIS — Z882 Allergy status to sulfonamides status: Secondary | ICD-10-CM | POA: Diagnosis not present

## 2022-04-21 DIAGNOSIS — E039 Hypothyroidism, unspecified: Secondary | ICD-10-CM | POA: Diagnosis present

## 2022-04-21 DIAGNOSIS — M810 Age-related osteoporosis without current pathological fracture: Secondary | ICD-10-CM | POA: Diagnosis present

## 2022-04-21 DIAGNOSIS — G8929 Other chronic pain: Secondary | ICD-10-CM | POA: Diagnosis present

## 2022-04-21 DIAGNOSIS — N183 Chronic kidney disease, stage 3 unspecified: Secondary | ICD-10-CM | POA: Diagnosis present

## 2022-04-21 DIAGNOSIS — Z881 Allergy status to other antibiotic agents status: Secondary | ICD-10-CM | POA: Diagnosis not present

## 2022-04-21 DIAGNOSIS — I5043 Acute on chronic combined systolic (congestive) and diastolic (congestive) heart failure: Secondary | ICD-10-CM | POA: Diagnosis present

## 2022-04-21 DIAGNOSIS — I517 Cardiomegaly: Secondary | ICD-10-CM | POA: Diagnosis not present

## 2022-04-21 DIAGNOSIS — E785 Hyperlipidemia, unspecified: Secondary | ICD-10-CM | POA: Diagnosis present

## 2022-04-21 DIAGNOSIS — I083 Combined rheumatic disorders of mitral, aortic and tricuspid valves: Secondary | ICD-10-CM | POA: Diagnosis not present

## 2022-04-21 DIAGNOSIS — M549 Dorsalgia, unspecified: Secondary | ICD-10-CM | POA: Diagnosis present

## 2022-04-21 DIAGNOSIS — Z887 Allergy status to serum and vaccine status: Secondary | ICD-10-CM | POA: Diagnosis not present

## 2022-04-21 DIAGNOSIS — Z950 Presence of cardiac pacemaker: Secondary | ICD-10-CM | POA: Diagnosis not present

## 2022-04-21 DIAGNOSIS — N179 Acute kidney failure, unspecified: Secondary | ICD-10-CM | POA: Diagnosis present

## 2022-04-21 DIAGNOSIS — Z79899 Other long term (current) drug therapy: Secondary | ICD-10-CM | POA: Diagnosis not present

## 2022-04-21 DIAGNOSIS — I5022 Chronic systolic (congestive) heart failure: Secondary | ICD-10-CM | POA: Diagnosis not present

## 2022-04-21 DIAGNOSIS — I130 Hypertensive heart and chronic kidney disease with heart failure and stage 1 through stage 4 chronic kidney disease, or unspecified chronic kidney disease: Secondary | ICD-10-CM | POA: Diagnosis present

## 2022-04-21 DIAGNOSIS — K219 Gastro-esophageal reflux disease without esophagitis: Secondary | ICD-10-CM | POA: Diagnosis present

## 2022-04-21 DIAGNOSIS — F419 Anxiety disorder, unspecified: Secondary | ICD-10-CM | POA: Diagnosis not present

## 2022-04-21 DIAGNOSIS — I052 Rheumatic mitral stenosis with insufficiency: Secondary | ICD-10-CM | POA: Diagnosis present

## 2022-04-21 DIAGNOSIS — Z79890 Hormone replacement therapy (postmenopausal): Secondary | ICD-10-CM | POA: Diagnosis not present

## 2022-04-25 DIAGNOSIS — N179 Acute kidney failure, unspecified: Secondary | ICD-10-CM | POA: Diagnosis not present

## 2022-04-25 DIAGNOSIS — S22000A Wedge compression fracture of unspecified thoracic vertebra, initial encounter for closed fracture: Secondary | ICD-10-CM | POA: Diagnosis not present

## 2022-04-25 DIAGNOSIS — D631 Anemia in chronic kidney disease: Secondary | ICD-10-CM | POA: Diagnosis not present

## 2022-04-25 DIAGNOSIS — I5030 Unspecified diastolic (congestive) heart failure: Secondary | ICD-10-CM | POA: Diagnosis not present

## 2022-04-25 DIAGNOSIS — I509 Heart failure, unspecified: Secondary | ICD-10-CM | POA: Diagnosis not present

## 2022-04-25 DIAGNOSIS — I10 Essential (primary) hypertension: Secondary | ICD-10-CM | POA: Diagnosis not present

## 2022-04-25 DIAGNOSIS — D649 Anemia, unspecified: Secondary | ICD-10-CM | POA: Diagnosis not present

## 2022-04-25 DIAGNOSIS — I872 Venous insufficiency (chronic) (peripheral): Secondary | ICD-10-CM | POA: Diagnosis not present

## 2022-04-25 DIAGNOSIS — I130 Hypertensive heart and chronic kidney disease with heart failure and stage 1 through stage 4 chronic kidney disease, or unspecified chronic kidney disease: Secondary | ICD-10-CM | POA: Diagnosis not present

## 2022-04-25 DIAGNOSIS — G40909 Epilepsy, unspecified, not intractable, without status epilepticus: Secondary | ICD-10-CM | POA: Diagnosis not present

## 2022-04-25 DIAGNOSIS — L97821 Non-pressure chronic ulcer of other part of left lower leg limited to breakdown of skin: Secondary | ICD-10-CM | POA: Diagnosis not present

## 2022-04-25 DIAGNOSIS — N183 Chronic kidney disease, stage 3 unspecified: Secondary | ICD-10-CM | POA: Diagnosis not present

## 2022-04-29 DIAGNOSIS — I5030 Unspecified diastolic (congestive) heart failure: Secondary | ICD-10-CM | POA: Diagnosis not present

## 2022-04-29 DIAGNOSIS — I872 Venous insufficiency (chronic) (peripheral): Secondary | ICD-10-CM | POA: Diagnosis not present

## 2022-04-29 DIAGNOSIS — N183 Chronic kidney disease, stage 3 unspecified: Secondary | ICD-10-CM | POA: Diagnosis not present

## 2022-04-29 DIAGNOSIS — L97821 Non-pressure chronic ulcer of other part of left lower leg limited to breakdown of skin: Secondary | ICD-10-CM | POA: Diagnosis not present

## 2022-04-29 DIAGNOSIS — I130 Hypertensive heart and chronic kidney disease with heart failure and stage 1 through stage 4 chronic kidney disease, or unspecified chronic kidney disease: Secondary | ICD-10-CM | POA: Diagnosis not present

## 2022-04-29 DIAGNOSIS — D631 Anemia in chronic kidney disease: Secondary | ICD-10-CM | POA: Diagnosis not present

## 2022-04-30 DIAGNOSIS — M5459 Other low back pain: Secondary | ICD-10-CM | POA: Diagnosis not present

## 2022-04-30 DIAGNOSIS — S22060A Wedge compression fracture of T7-T8 vertebra, initial encounter for closed fracture: Secondary | ICD-10-CM | POA: Diagnosis not present

## 2022-05-01 DIAGNOSIS — I509 Heart failure, unspecified: Secondary | ICD-10-CM | POA: Diagnosis not present

## 2022-05-01 DIAGNOSIS — E7841 Elevated Lipoprotein(a): Secondary | ICD-10-CM | POA: Diagnosis not present

## 2022-05-01 DIAGNOSIS — Z13228 Encounter for screening for other metabolic disorders: Secondary | ICD-10-CM | POA: Diagnosis not present

## 2022-05-02 DIAGNOSIS — L97821 Non-pressure chronic ulcer of other part of left lower leg limited to breakdown of skin: Secondary | ICD-10-CM | POA: Diagnosis not present

## 2022-05-02 DIAGNOSIS — D631 Anemia in chronic kidney disease: Secondary | ICD-10-CM | POA: Diagnosis not present

## 2022-05-02 DIAGNOSIS — I872 Venous insufficiency (chronic) (peripheral): Secondary | ICD-10-CM | POA: Diagnosis not present

## 2022-05-02 DIAGNOSIS — N183 Chronic kidney disease, stage 3 unspecified: Secondary | ICD-10-CM | POA: Diagnosis not present

## 2022-05-02 DIAGNOSIS — I5030 Unspecified diastolic (congestive) heart failure: Secondary | ICD-10-CM | POA: Diagnosis not present

## 2022-05-02 DIAGNOSIS — I130 Hypertensive heart and chronic kidney disease with heart failure and stage 1 through stage 4 chronic kidney disease, or unspecified chronic kidney disease: Secondary | ICD-10-CM | POA: Diagnosis not present

## 2022-05-21 DIAGNOSIS — M8000XG Age-related osteoporosis with current pathological fracture, unspecified site, subsequent encounter for fracture with delayed healing: Secondary | ICD-10-CM | POA: Diagnosis not present

## 2022-05-21 DIAGNOSIS — M1A9XX1 Chronic gout, unspecified, with tophus (tophi): Secondary | ICD-10-CM | POA: Diagnosis not present

## 2022-05-21 DIAGNOSIS — M5136 Other intervertebral disc degeneration, lumbar region: Secondary | ICD-10-CM | POA: Diagnosis not present

## 2022-05-21 DIAGNOSIS — M353 Polymyalgia rheumatica: Secondary | ICD-10-CM | POA: Diagnosis not present

## 2022-05-31 DIAGNOSIS — M5459 Other low back pain: Secondary | ICD-10-CM | POA: Diagnosis not present

## 2022-05-31 DIAGNOSIS — S22060A Wedge compression fracture of T7-T8 vertebra, initial encounter for closed fracture: Secondary | ICD-10-CM | POA: Diagnosis not present

## 2022-06-03 DIAGNOSIS — Z7952 Long term (current) use of systemic steroids: Secondary | ICD-10-CM | POA: Diagnosis not present

## 2022-06-03 DIAGNOSIS — Z9181 History of falling: Secondary | ICD-10-CM | POA: Diagnosis not present

## 2022-06-03 DIAGNOSIS — S22060D Wedge compression fracture of T7-T8 vertebra, subsequent encounter for fracture with routine healing: Secondary | ICD-10-CM | POA: Diagnosis not present

## 2022-06-03 DIAGNOSIS — M545 Low back pain, unspecified: Secondary | ICD-10-CM | POA: Diagnosis not present

## 2022-06-03 DIAGNOSIS — M1990 Unspecified osteoarthritis, unspecified site: Secondary | ICD-10-CM | POA: Diagnosis not present

## 2022-06-04 DIAGNOSIS — Z9181 History of falling: Secondary | ICD-10-CM | POA: Diagnosis not present

## 2022-06-04 DIAGNOSIS — M545 Low back pain, unspecified: Secondary | ICD-10-CM | POA: Diagnosis not present

## 2022-06-04 DIAGNOSIS — M1990 Unspecified osteoarthritis, unspecified site: Secondary | ICD-10-CM | POA: Diagnosis not present

## 2022-06-04 DIAGNOSIS — Z7952 Long term (current) use of systemic steroids: Secondary | ICD-10-CM | POA: Diagnosis not present

## 2022-06-04 DIAGNOSIS — S22060D Wedge compression fracture of T7-T8 vertebra, subsequent encounter for fracture with routine healing: Secondary | ICD-10-CM | POA: Diagnosis not present

## 2022-06-05 DIAGNOSIS — I10 Essential (primary) hypertension: Secondary | ICD-10-CM | POA: Diagnosis not present

## 2022-06-05 DIAGNOSIS — I509 Heart failure, unspecified: Secondary | ICD-10-CM | POA: Diagnosis not present

## 2022-06-05 DIAGNOSIS — M109 Gout, unspecified: Secondary | ICD-10-CM | POA: Diagnosis not present

## 2022-06-09 DIAGNOSIS — Z7952 Long term (current) use of systemic steroids: Secondary | ICD-10-CM | POA: Diagnosis not present

## 2022-06-09 DIAGNOSIS — M545 Low back pain, unspecified: Secondary | ICD-10-CM | POA: Diagnosis not present

## 2022-06-09 DIAGNOSIS — S22060D Wedge compression fracture of T7-T8 vertebra, subsequent encounter for fracture with routine healing: Secondary | ICD-10-CM | POA: Diagnosis not present

## 2022-06-09 DIAGNOSIS — Z9181 History of falling: Secondary | ICD-10-CM | POA: Diagnosis not present

## 2022-06-09 DIAGNOSIS — M1990 Unspecified osteoarthritis, unspecified site: Secondary | ICD-10-CM | POA: Diagnosis not present

## 2022-06-11 DIAGNOSIS — Z7952 Long term (current) use of systemic steroids: Secondary | ICD-10-CM | POA: Diagnosis not present

## 2022-06-11 DIAGNOSIS — Z9181 History of falling: Secondary | ICD-10-CM | POA: Diagnosis not present

## 2022-06-11 DIAGNOSIS — M1990 Unspecified osteoarthritis, unspecified site: Secondary | ICD-10-CM | POA: Diagnosis not present

## 2022-06-11 DIAGNOSIS — M545 Low back pain, unspecified: Secondary | ICD-10-CM | POA: Diagnosis not present

## 2022-06-11 DIAGNOSIS — S22060D Wedge compression fracture of T7-T8 vertebra, subsequent encounter for fracture with routine healing: Secondary | ICD-10-CM | POA: Diagnosis not present

## 2022-06-13 DIAGNOSIS — M1990 Unspecified osteoarthritis, unspecified site: Secondary | ICD-10-CM | POA: Diagnosis not present

## 2022-06-13 DIAGNOSIS — Z7952 Long term (current) use of systemic steroids: Secondary | ICD-10-CM | POA: Diagnosis not present

## 2022-06-13 DIAGNOSIS — M545 Low back pain, unspecified: Secondary | ICD-10-CM | POA: Diagnosis not present

## 2022-06-13 DIAGNOSIS — Z9181 History of falling: Secondary | ICD-10-CM | POA: Diagnosis not present

## 2022-06-13 DIAGNOSIS — S22060D Wedge compression fracture of T7-T8 vertebra, subsequent encounter for fracture with routine healing: Secondary | ICD-10-CM | POA: Diagnosis not present

## 2022-06-14 DIAGNOSIS — S22060D Wedge compression fracture of T7-T8 vertebra, subsequent encounter for fracture with routine healing: Secondary | ICD-10-CM | POA: Diagnosis not present

## 2022-06-14 DIAGNOSIS — M545 Low back pain, unspecified: Secondary | ICD-10-CM | POA: Diagnosis not present

## 2022-06-14 DIAGNOSIS — Z7952 Long term (current) use of systemic steroids: Secondary | ICD-10-CM | POA: Diagnosis not present

## 2022-06-14 DIAGNOSIS — M1990 Unspecified osteoarthritis, unspecified site: Secondary | ICD-10-CM | POA: Diagnosis not present

## 2022-06-14 DIAGNOSIS — Z9181 History of falling: Secondary | ICD-10-CM | POA: Diagnosis not present

## 2022-06-15 DIAGNOSIS — I890 Lymphedema, not elsewhere classified: Secondary | ICD-10-CM | POA: Diagnosis not present

## 2022-06-15 DIAGNOSIS — Z79899 Other long term (current) drug therapy: Secondary | ICD-10-CM | POA: Diagnosis not present

## 2022-06-15 DIAGNOSIS — I480 Paroxysmal atrial fibrillation: Secondary | ICD-10-CM | POA: Diagnosis not present

## 2022-06-15 DIAGNOSIS — R601 Generalized edema: Secondary | ICD-10-CM | POA: Diagnosis not present

## 2022-06-15 DIAGNOSIS — R609 Edema, unspecified: Secondary | ICD-10-CM | POA: Diagnosis not present

## 2022-06-15 DIAGNOSIS — R110 Nausea: Secondary | ICD-10-CM | POA: Diagnosis not present

## 2022-06-16 DIAGNOSIS — M545 Low back pain, unspecified: Secondary | ICD-10-CM | POA: Diagnosis not present

## 2022-06-16 DIAGNOSIS — S22060D Wedge compression fracture of T7-T8 vertebra, subsequent encounter for fracture with routine healing: Secondary | ICD-10-CM | POA: Diagnosis not present

## 2022-06-16 DIAGNOSIS — Z7952 Long term (current) use of systemic steroids: Secondary | ICD-10-CM | POA: Diagnosis not present

## 2022-06-16 DIAGNOSIS — M1990 Unspecified osteoarthritis, unspecified site: Secondary | ICD-10-CM | POA: Diagnosis not present

## 2022-06-16 DIAGNOSIS — Z9181 History of falling: Secondary | ICD-10-CM | POA: Diagnosis not present

## 2022-06-17 DIAGNOSIS — R0902 Hypoxemia: Secondary | ICD-10-CM | POA: Diagnosis not present

## 2022-06-17 DIAGNOSIS — R4182 Altered mental status, unspecified: Secondary | ICD-10-CM | POA: Diagnosis not present

## 2022-06-17 DIAGNOSIS — I519 Heart disease, unspecified: Secondary | ICD-10-CM | POA: Diagnosis not present

## 2022-06-17 DIAGNOSIS — R55 Syncope and collapse: Secondary | ICD-10-CM | POA: Diagnosis not present

## 2022-06-17 DIAGNOSIS — M810 Age-related osteoporosis without current pathological fracture: Secondary | ICD-10-CM | POA: Diagnosis not present

## 2022-06-17 DIAGNOSIS — I2602 Saddle embolus of pulmonary artery with acute cor pulmonale: Secondary | ICD-10-CM | POA: Diagnosis not present

## 2022-06-17 DIAGNOSIS — Z7952 Long term (current) use of systemic steroids: Secondary | ICD-10-CM | POA: Diagnosis not present

## 2022-06-17 DIAGNOSIS — F419 Anxiety disorder, unspecified: Secondary | ICD-10-CM | POA: Diagnosis not present

## 2022-06-17 DIAGNOSIS — K7200 Acute and subacute hepatic failure without coma: Secondary | ICD-10-CM | POA: Diagnosis not present

## 2022-06-17 DIAGNOSIS — I82402 Acute embolism and thrombosis of unspecified deep veins of left lower extremity: Secondary | ICD-10-CM | POA: Diagnosis not present

## 2022-06-17 DIAGNOSIS — I82409 Acute embolism and thrombosis of unspecified deep veins of unspecified lower extremity: Secondary | ICD-10-CM | POA: Diagnosis not present

## 2022-06-17 DIAGNOSIS — A419 Sepsis, unspecified organism: Secondary | ICD-10-CM | POA: Diagnosis not present

## 2022-06-17 DIAGNOSIS — K5730 Diverticulosis of large intestine without perforation or abscess without bleeding: Secondary | ICD-10-CM | POA: Diagnosis not present

## 2022-06-17 DIAGNOSIS — I4729 Other ventricular tachycardia: Secondary | ICD-10-CM | POA: Diagnosis not present

## 2022-06-17 DIAGNOSIS — R52 Pain, unspecified: Secondary | ICD-10-CM | POA: Diagnosis not present

## 2022-06-17 DIAGNOSIS — T68XXXA Hypothermia, initial encounter: Secondary | ICD-10-CM | POA: Diagnosis not present

## 2022-06-17 DIAGNOSIS — N189 Chronic kidney disease, unspecified: Secondary | ICD-10-CM | POA: Diagnosis not present

## 2022-06-17 DIAGNOSIS — I5022 Chronic systolic (congestive) heart failure: Secondary | ICD-10-CM | POA: Diagnosis not present

## 2022-06-17 DIAGNOSIS — I5189 Other ill-defined heart diseases: Secondary | ICD-10-CM | POA: Diagnosis not present

## 2022-06-17 DIAGNOSIS — Z66 Do not resuscitate: Secondary | ICD-10-CM | POA: Diagnosis not present

## 2022-06-17 DIAGNOSIS — R451 Restlessness and agitation: Secondary | ICD-10-CM | POA: Diagnosis not present

## 2022-06-17 DIAGNOSIS — Z515 Encounter for palliative care: Secondary | ICD-10-CM | POA: Diagnosis not present

## 2022-06-17 DIAGNOSIS — M353 Polymyalgia rheumatica: Secondary | ICD-10-CM | POA: Diagnosis not present

## 2022-06-17 DIAGNOSIS — J9602 Acute respiratory failure with hypercapnia: Secondary | ICD-10-CM | POA: Diagnosis not present

## 2022-06-17 DIAGNOSIS — R6521 Severe sepsis with septic shock: Secondary | ICD-10-CM | POA: Diagnosis not present

## 2022-06-17 DIAGNOSIS — R0602 Shortness of breath: Secondary | ICD-10-CM | POA: Diagnosis not present

## 2022-06-17 DIAGNOSIS — E875 Hyperkalemia: Secondary | ICD-10-CM | POA: Diagnosis not present

## 2022-06-17 DIAGNOSIS — R404 Transient alteration of awareness: Secondary | ICD-10-CM | POA: Diagnosis not present

## 2022-06-17 DIAGNOSIS — E039 Hypothyroidism, unspecified: Secondary | ICD-10-CM | POA: Diagnosis not present

## 2022-06-17 DIAGNOSIS — N184 Chronic kidney disease, stage 4 (severe): Secondary | ICD-10-CM | POA: Diagnosis not present

## 2022-06-17 DIAGNOSIS — M1990 Unspecified osteoarthritis, unspecified site: Secondary | ICD-10-CM | POA: Diagnosis not present

## 2022-06-17 DIAGNOSIS — I5043 Acute on chronic combined systolic (congestive) and diastolic (congestive) heart failure: Secondary | ICD-10-CM | POA: Diagnosis not present

## 2022-06-17 DIAGNOSIS — R140 Abdominal distension (gaseous): Secondary | ICD-10-CM | POA: Diagnosis not present

## 2022-06-17 DIAGNOSIS — I5020 Unspecified systolic (congestive) heart failure: Secondary | ICD-10-CM | POA: Diagnosis not present

## 2022-06-17 DIAGNOSIS — E872 Acidosis, unspecified: Secondary | ICD-10-CM | POA: Diagnosis not present

## 2022-06-17 DIAGNOSIS — R4020 Unspecified coma: Secondary | ICD-10-CM | POA: Diagnosis not present

## 2022-06-17 DIAGNOSIS — J9601 Acute respiratory failure with hypoxia: Secondary | ICD-10-CM | POA: Diagnosis not present

## 2022-06-17 DIAGNOSIS — N3289 Other specified disorders of bladder: Secondary | ICD-10-CM | POA: Diagnosis not present

## 2022-06-17 DIAGNOSIS — I214 Non-ST elevation (NSTEMI) myocardial infarction: Secondary | ICD-10-CM | POA: Diagnosis not present

## 2022-06-17 DIAGNOSIS — R918 Other nonspecific abnormal finding of lung field: Secondary | ICD-10-CM | POA: Diagnosis not present

## 2022-06-17 DIAGNOSIS — J9600 Acute respiratory failure, unspecified whether with hypoxia or hypercapnia: Secondary | ICD-10-CM | POA: Diagnosis not present

## 2022-06-17 DIAGNOSIS — N179 Acute kidney failure, unspecified: Secondary | ICD-10-CM | POA: Diagnosis not present

## 2022-06-17 DIAGNOSIS — J984 Other disorders of lung: Secondary | ICD-10-CM | POA: Diagnosis not present

## 2022-06-17 DIAGNOSIS — Z7189 Other specified counseling: Secondary | ICD-10-CM | POA: Diagnosis not present

## 2022-06-17 DIAGNOSIS — M545 Low back pain, unspecified: Secondary | ICD-10-CM | POA: Diagnosis not present

## 2022-06-17 DIAGNOSIS — D649 Anemia, unspecified: Secondary | ICD-10-CM | POA: Diagnosis not present

## 2022-06-17 DIAGNOSIS — Z79891 Long term (current) use of opiate analgesic: Secondary | ICD-10-CM | POA: Diagnosis not present

## 2022-06-17 DIAGNOSIS — A4159 Other Gram-negative sepsis: Secondary | ICD-10-CM | POA: Diagnosis not present

## 2022-06-17 DIAGNOSIS — Z9181 History of falling: Secondary | ICD-10-CM | POA: Diagnosis not present

## 2022-06-17 DIAGNOSIS — I959 Hypotension, unspecified: Secondary | ICD-10-CM | POA: Diagnosis not present

## 2022-06-17 DIAGNOSIS — R7881 Bacteremia: Secondary | ICD-10-CM | POA: Diagnosis not present

## 2022-06-17 DIAGNOSIS — Z95810 Presence of automatic (implantable) cardiac defibrillator: Secondary | ICD-10-CM | POA: Diagnosis not present

## 2022-06-17 DIAGNOSIS — R579 Shock, unspecified: Secondary | ICD-10-CM | POA: Diagnosis not present

## 2022-06-17 DIAGNOSIS — R601 Generalized edema: Secondary | ICD-10-CM | POA: Diagnosis not present

## 2022-06-17 DIAGNOSIS — S22060D Wedge compression fracture of T7-T8 vertebra, subsequent encounter for fracture with routine healing: Secondary | ICD-10-CM | POA: Diagnosis not present

## 2022-06-17 DIAGNOSIS — I509 Heart failure, unspecified: Secondary | ICD-10-CM | POA: Diagnosis not present

## 2022-06-17 DIAGNOSIS — D696 Thrombocytopenia, unspecified: Secondary | ICD-10-CM | POA: Diagnosis not present

## 2022-06-17 DIAGNOSIS — G9341 Metabolic encephalopathy: Secondary | ICD-10-CM | POA: Diagnosis not present

## 2022-06-17 DIAGNOSIS — R680 Hypothermia, not associated with low environmental temperature: Secondary | ICD-10-CM | POA: Diagnosis not present

## 2022-06-17 DIAGNOSIS — D62 Acute posthemorrhagic anemia: Secondary | ICD-10-CM | POA: Diagnosis not present

## 2022-06-17 DIAGNOSIS — I3481 Nonrheumatic mitral (valve) annulus calcification: Secondary | ICD-10-CM | POA: Diagnosis not present

## 2022-06-17 DIAGNOSIS — Z4682 Encounter for fitting and adjustment of non-vascular catheter: Secondary | ICD-10-CM | POA: Diagnosis not present

## 2022-06-17 DIAGNOSIS — N3000 Acute cystitis without hematuria: Secondary | ICD-10-CM | POA: Diagnosis not present

## 2022-06-17 DIAGNOSIS — E860 Dehydration: Secondary | ICD-10-CM | POA: Diagnosis not present

## 2022-06-17 DIAGNOSIS — R578 Other shock: Secondary | ICD-10-CM | POA: Diagnosis not present

## 2022-06-17 DIAGNOSIS — M79605 Pain in left leg: Secondary | ICD-10-CM | POA: Diagnosis not present

## 2022-06-17 DIAGNOSIS — Z952 Presence of prosthetic heart valve: Secondary | ICD-10-CM | POA: Diagnosis not present

## 2022-06-17 DIAGNOSIS — J9811 Atelectasis: Secondary | ICD-10-CM | POA: Diagnosis not present

## 2022-06-17 DIAGNOSIS — R410 Disorientation, unspecified: Secondary | ICD-10-CM | POA: Diagnosis not present

## 2022-06-26 DEATH — deceased

## 2022-10-12 ENCOUNTER — Encounter: Payer: Medicare Other | Admitting: Internal Medicine

## 2023-03-22 IMAGING — US US CAROTID DUPLEX BILAT
1 series · 13 of 24 positions shown · non-contrast
Comparison: None.

CLINICAL DATA: 89-year-old female with a history of right carotid
bruit on physical exam

EXAM:
BILATERAL CAROTID DUPLEX ULTRASOUND
TECHNIQUE: Gray scale imaging, color Doppler and duplex ultrasound were
performed of bilateral carotid and vertebral arteries in the neck.

[Series 1: us carotid duplex bilat · 0.06mm/px · 13 of 58 slices shown]
[im 1/58]
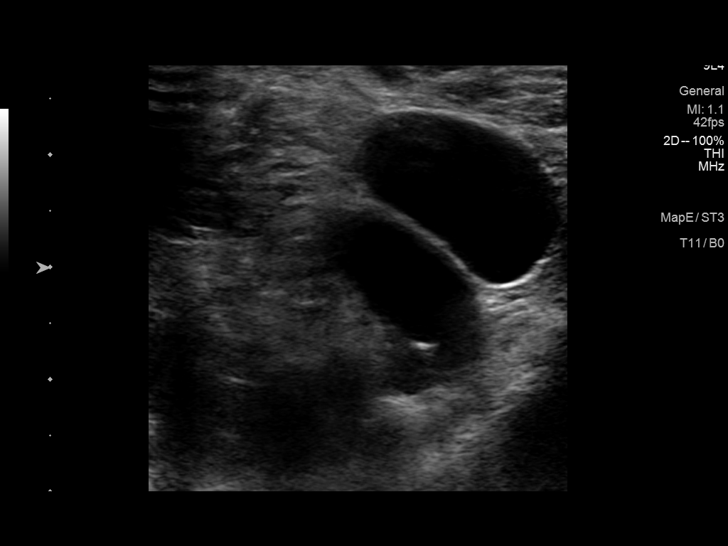
[im 5/58]
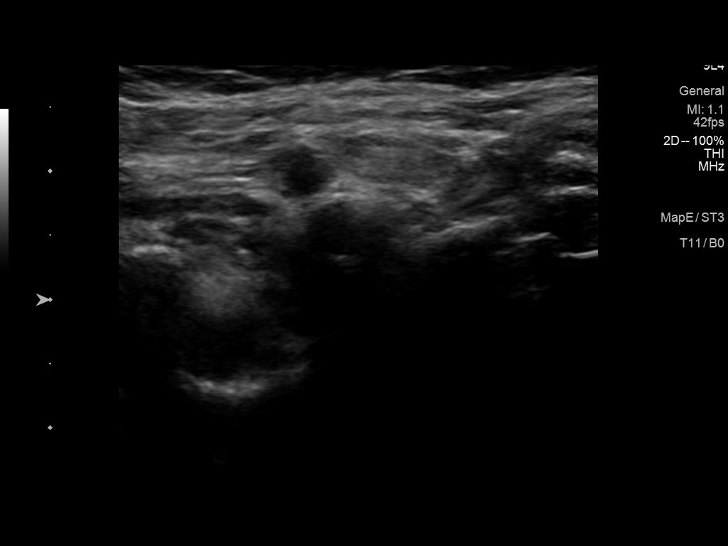
[im 10/58]
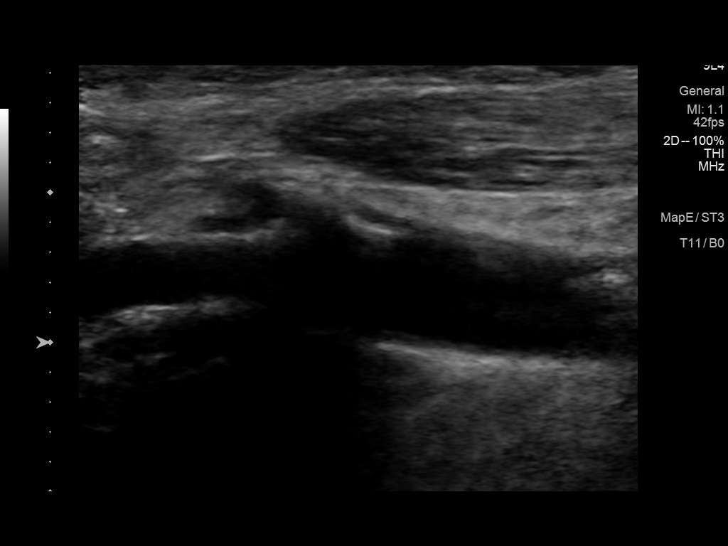
[im 15/58]
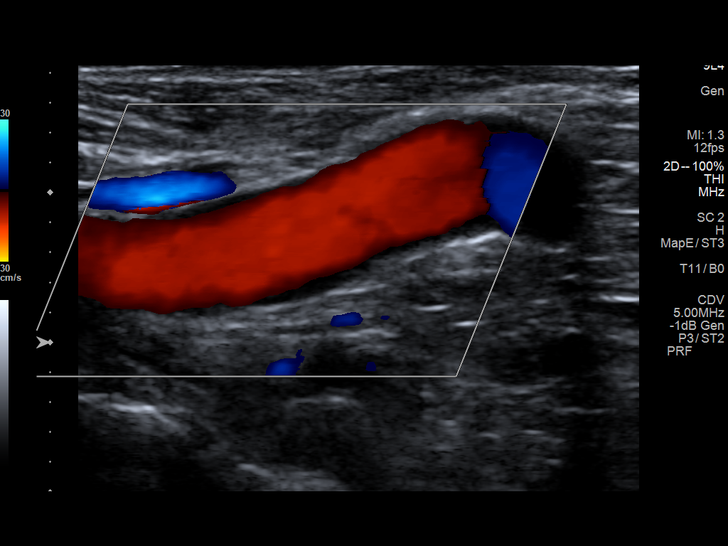
[im 20/58]
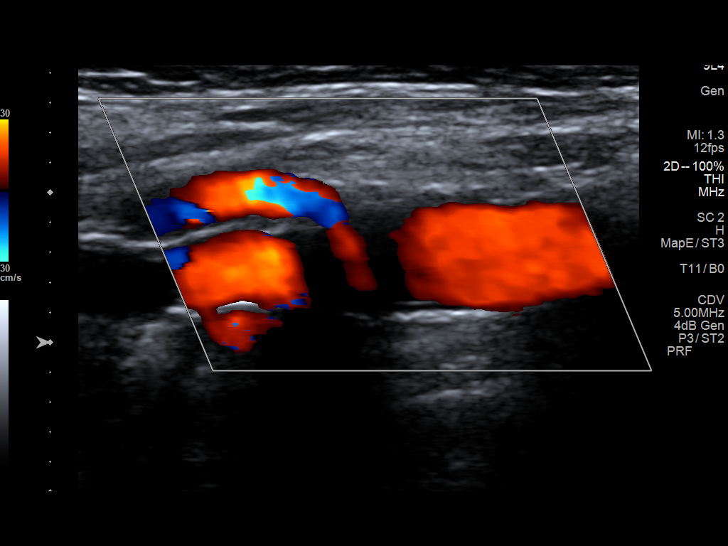
[im 25/58]
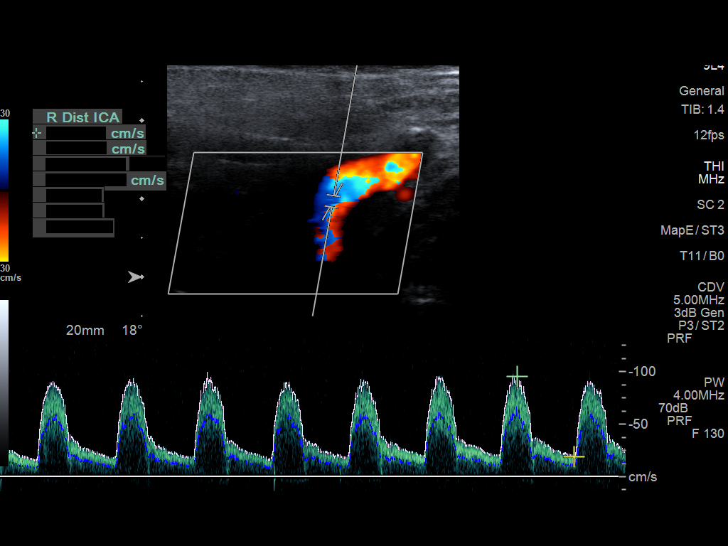
[im 30/58]
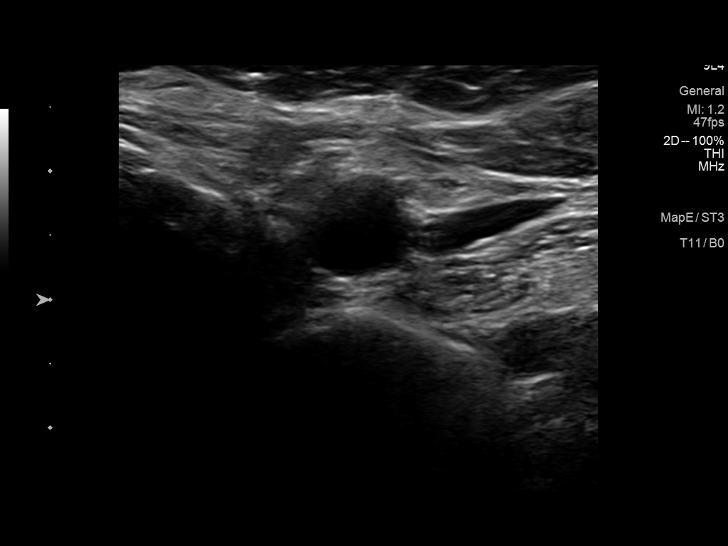
[im 33/58]
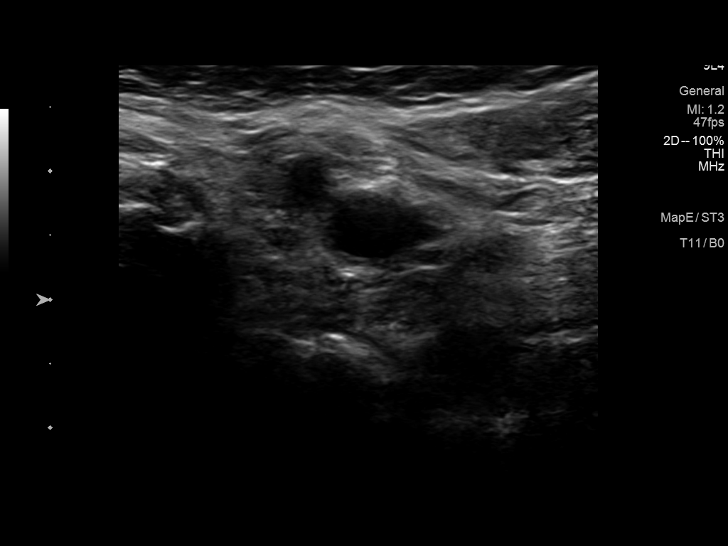
[im 38/58]
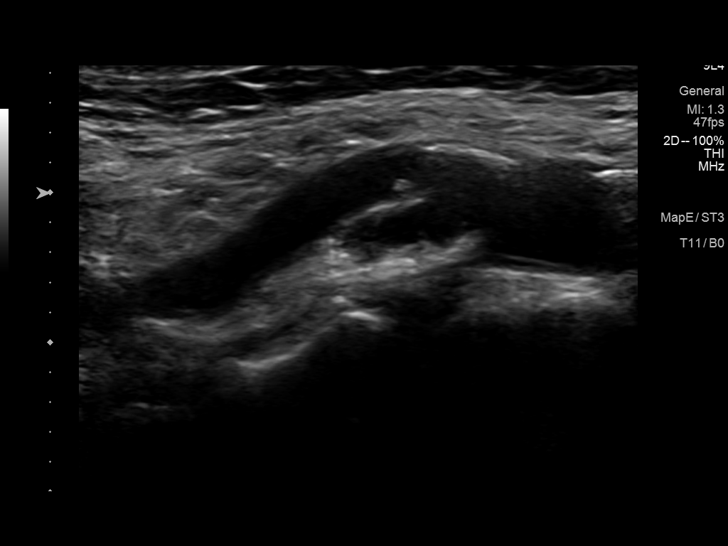
[im 43/58]
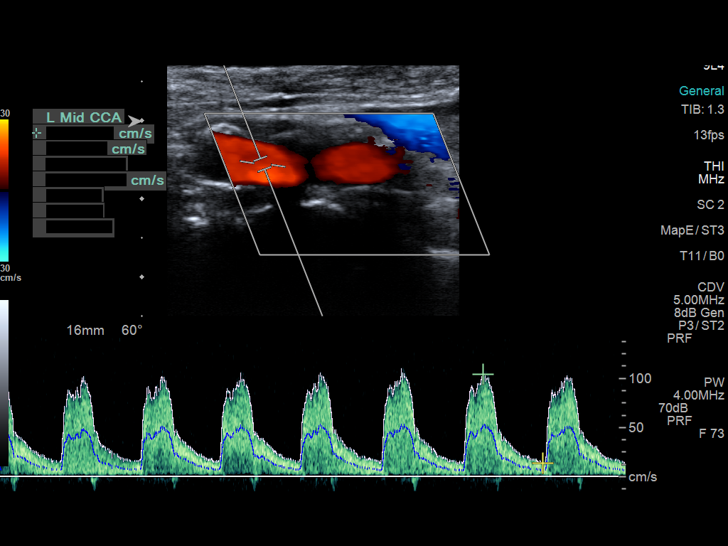
[im 48/58]
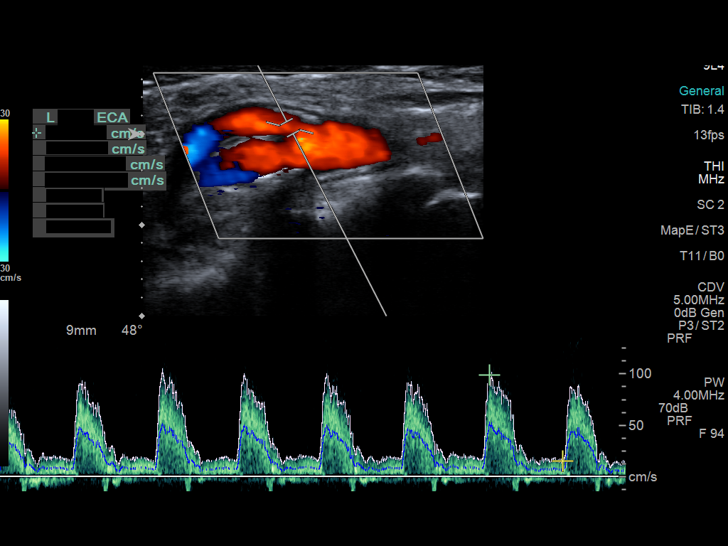
[im 53/58]
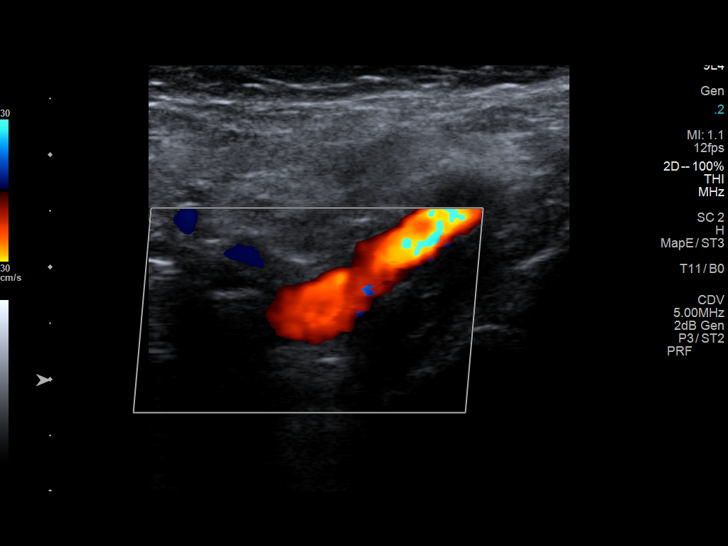
[im 58/58]
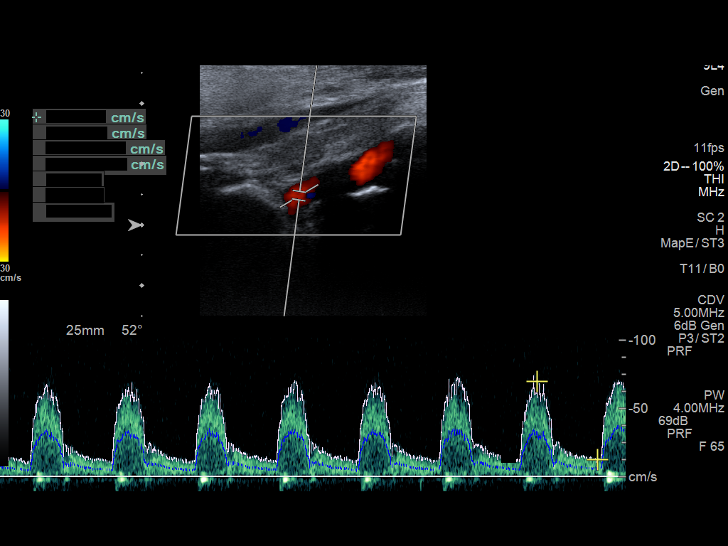

[13 of 24 positions shown; findings below may reference images not displayed]

FINDINGS: Criteria: Quantification of carotid stenosis is based on velocity
parameters that correlate the residual internal carotid diameter
with NASCET-based stenosis levels, using the diameter of the distal
internal carotid lumen as the denominator for stenosis measurement.

The following velocity measurements were obtained:

RIGHT

ICA:  Systolic 124 cm/sec, Diastolic 22 cm/sec

CCA:  105 cm/sec

SYSTOLIC ICA/CCA RATIO:

ECA:  74 cm/sec

LEFT

ICA:  Systolic 139 cm/sec, Diastolic 22 cm/sec

CCA:  107 cm/sec

SYSTOLIC ICA/CCA RATIO:

ECA:  99 cm/sec

Right Brachial SBP: 195

Left Brachial SBP: 194

RIGHT CAROTID ARTERY: No significant calcifications of the right
common carotid artery. Intermediate waveform maintained.
Heterogeneous and partially calcified plaque at the right carotid
bifurcation. Lumen shadowing present. Low resistance waveform of
the right ICA. Tortuosity

RIGHT VERTEBRAL ARTERY: Antegrade flow with low resistance waveform.

LEFT CAROTID ARTERY: No significant calcifications of the left
common carotid artery. Intermediate waveform maintained.
Heterogeneous and partially calcified plaque at the left carotid
bifurcation without significant lumen shadowing. Low resistance
waveform of the left ICA. Tortuosity

LEFT VERTEBRAL ARTERY:  Antegrade flow with low resistance waveform.
IMPRESSION: Right:

Color duplex indicates minimal heterogeneous and calcified plaque,
with no hemodynamically significant stenosis by duplex criteria in
the extracranial cerebrovascular circulation. However, note that
flow velocities of the right ICA were obtained from an area distal
to the maximum narrowing due to the presence of anterior wall plaque
with shadowing and may be underestimating the percentage of ICA
stenosis.

Left:

Color duplex indicates minimal heterogeneous and calcified plaque,
with no hemodynamically significant stenosis by duplex criteria in
the extracranial cerebrovascular circulation.
